# Patient Record
Sex: Female | Born: 1940 | Race: White | Hispanic: No | Smoking: Former smoker
Health system: Southern US, Community
[De-identification: ages and names within clinical notes are randomized; demographics above are authoritative.]

## PROBLEM LIST (undated history)

## (undated) DIAGNOSIS — R7303 Prediabetes: Secondary | ICD-10-CM

## (undated) DIAGNOSIS — K219 Gastro-esophageal reflux disease without esophagitis: Secondary | ICD-10-CM

## (undated) DIAGNOSIS — M1990 Unspecified osteoarthritis, unspecified site: Secondary | ICD-10-CM

## (undated) DIAGNOSIS — H9190 Unspecified hearing loss, unspecified ear: Secondary | ICD-10-CM

## (undated) DIAGNOSIS — E119 Type 2 diabetes mellitus without complications: Secondary | ICD-10-CM

## (undated) DIAGNOSIS — F419 Anxiety disorder, unspecified: Secondary | ICD-10-CM

## (undated) DIAGNOSIS — Z87898 Personal history of other specified conditions: Secondary | ICD-10-CM

## (undated) DIAGNOSIS — H3320 Serous retinal detachment, unspecified eye: Secondary | ICD-10-CM

## (undated) DIAGNOSIS — I499 Cardiac arrhythmia, unspecified: Secondary | ICD-10-CM

## (undated) DIAGNOSIS — E785 Hyperlipidemia, unspecified: Secondary | ICD-10-CM

## (undated) DIAGNOSIS — R112 Nausea with vomiting, unspecified: Secondary | ICD-10-CM

## (undated) DIAGNOSIS — I341 Nonrheumatic mitral (valve) prolapse: Secondary | ICD-10-CM

## (undated) DIAGNOSIS — Z9889 Other specified postprocedural states: Secondary | ICD-10-CM

## (undated) HISTORY — PX: COLONOSCOPY: SHX174

## (undated) HISTORY — DX: Serous retinal detachment, unspecified eye: H33.20

## (undated) HISTORY — PX: RETINAL DETACHMENT SURGERY: SHX105

## (undated) HISTORY — PX: TONSILLECTOMY: SUR1361

## (undated) HISTORY — PX: KNEE ARTHROSCOPY: SUR90

## (undated) HISTORY — PX: EYE SURGERY: SHX253

## (undated) HISTORY — DX: Type 2 diabetes mellitus without complications: E11.9

## (undated) HISTORY — PX: CATARACT EXTRACTION: SUR2

## (undated) HISTORY — PX: ABDOMINAL HYSTERECTOMY: SHX81

---
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
## 1998-10-20 ENCOUNTER — Ambulatory Visit (HOSPITAL_COMMUNITY): Admission: RE | Admit: 1998-10-20 | Discharge: 1998-10-20 | Payer: Self-pay | Admitting: Gastroenterology

## 1999-07-28 ENCOUNTER — Other Ambulatory Visit: Admission: RE | Admit: 1999-07-28 | Discharge: 1999-07-28 | Payer: Self-pay | Admitting: *Deleted

## 2000-06-28 ENCOUNTER — Ambulatory Visit (HOSPITAL_COMMUNITY): Admission: RE | Admit: 2000-06-28 | Discharge: 2000-06-28 | Payer: Self-pay | Admitting: *Deleted

## 2000-12-13 ENCOUNTER — Other Ambulatory Visit: Admission: RE | Admit: 2000-12-13 | Discharge: 2000-12-13 | Payer: Self-pay | Admitting: Family Medicine

## 2001-12-13 ENCOUNTER — Other Ambulatory Visit: Admission: RE | Admit: 2001-12-13 | Discharge: 2001-12-13 | Payer: Self-pay | Admitting: Family Medicine

## 2003-12-24 ENCOUNTER — Other Ambulatory Visit: Admission: RE | Admit: 2003-12-24 | Discharge: 2003-12-24 | Payer: Self-pay | Admitting: Family Medicine

## 2004-01-13 ENCOUNTER — Ambulatory Visit (HOSPITAL_COMMUNITY): Admission: RE | Admit: 2004-01-13 | Discharge: 2004-01-13 | Payer: Self-pay | Admitting: Gastroenterology

## 2004-03-31 ENCOUNTER — Encounter: Admission: RE | Admit: 2004-03-31 | Discharge: 2004-03-31 | Payer: Self-pay | Admitting: Family Medicine

## 2005-05-17 ENCOUNTER — Encounter: Admission: RE | Admit: 2005-05-17 | Discharge: 2005-05-17 | Payer: Self-pay | Admitting: Family Medicine

## 2006-01-04 ENCOUNTER — Encounter: Admission: RE | Admit: 2006-01-04 | Discharge: 2006-01-04 | Payer: Self-pay | Admitting: Orthopedic Surgery

## 2006-01-10 ENCOUNTER — Encounter: Admission: RE | Admit: 2006-01-10 | Discharge: 2006-01-10 | Payer: Self-pay | Admitting: Orthopedic Surgery

## 2006-08-11 ENCOUNTER — Other Ambulatory Visit: Admission: RE | Admit: 2006-08-11 | Discharge: 2006-08-11 | Payer: Self-pay | Admitting: Family Medicine

## 2007-04-07 ENCOUNTER — Encounter: Admission: RE | Admit: 2007-04-07 | Discharge: 2007-04-07 | Payer: Self-pay | Admitting: Orthopedic Surgery

## 2008-03-04 ENCOUNTER — Other Ambulatory Visit: Admission: RE | Admit: 2008-03-04 | Discharge: 2008-03-04 | Payer: Self-pay | Admitting: Family Medicine

## 2008-03-11 ENCOUNTER — Encounter: Admission: RE | Admit: 2008-03-11 | Discharge: 2008-03-11 | Payer: Self-pay | Admitting: Family Medicine

## 2008-07-08 ENCOUNTER — Emergency Department (HOSPITAL_COMMUNITY): Admission: EM | Admit: 2008-07-08 | Discharge: 2008-07-08 | Payer: Self-pay | Admitting: Emergency Medicine

## 2008-07-10 ENCOUNTER — Emergency Department (HOSPITAL_COMMUNITY): Admission: EM | Admit: 2008-07-10 | Discharge: 2008-07-10 | Payer: Self-pay | Admitting: Emergency Medicine

## 2010-02-03 ENCOUNTER — Other Ambulatory Visit: Admission: RE | Admit: 2010-02-03 | Discharge: 2010-02-03 | Payer: Self-pay | Admitting: Family Medicine

## 2011-04-02 NOTE — Op Note (Signed)
NAME:  Carla May, Carla May                        ACCOUNT NO.:  [id number]   MEDICAL RECORD NO.:  [id number]                   PATIENT TYPE:  AMB   LOCATION:  ENDO                                 FACILITY:  [facility]   PHYSICIAN:  John C. Madilyn Fireman, M.D.                 DATE OF BIRTH:  Oct 20, 1941   DATE OF PROCEDURE:  01/13/2004  DATE OF DISCHARGE:                                 OPERATIVE REPORT   PROCEDURE:  Colonoscopy.   INDICATIONS FOR PROCEDURE:  Family history of colon cancer in a first-degree  relative.  Last colonoscopy five years ago.   DESCRIPTION OF PROCEDURE:  The patient was placed in the left lateral  decubitus position and placed on the pulse monitor with continuous low-flow  oxygen delivered by nasal cannula.  She was sedated with 75 mcg IV fentanyl  and 7 mg IV Versed.  The Olympus video colonoscope was inserted into the  rectum and advanced to the cecum, confirmed by transillumination of  McBurney's point and visualization of the ileocecal valve and appendiceal  orifice.  Prep was excellent.  The cecum, ascending and transverse colon all  appeared normal with no masses, polyps, diverticula, or other mucosal  abnormalities.  The sigmoid and rectum likewise appeared normal and  retroflexed view of the anus revealed no obvious internal hemorrhoids.  The  scope was then withdrawn and the patient returned to the recovery room in  stable condition.  She tolerated the procedure well and there were no  immediate complications.   IMPRESSION:  Normal colonoscopy.   PLAN:  Repeat study in five years.                                               John C. Madilyn Fireman, M.D.    JCH/MEDQ  D:  01/13/2004  T:  01/13/2004  Job:  [id number]

## 2011-07-02 ENCOUNTER — Other Ambulatory Visit: Payer: Self-pay | Admitting: Family Medicine

## 2011-07-02 DIAGNOSIS — R42 Dizziness and giddiness: Secondary | ICD-10-CM

## 2011-07-08 ENCOUNTER — Ambulatory Visit
Admission: RE | Admit: 2011-07-08 | Discharge: 2011-07-08 | Disposition: A | Discharge status: Home / Self Care | Payer: Medicare Other | Source: Ambulatory Visit | Attending: Family Medicine | Admitting: Family Medicine

## 2011-07-08 DIAGNOSIS — R42 Dizziness and giddiness: Secondary | ICD-10-CM

## 2011-07-08 MED ORDER — GADOBENATE DIMEGLUMINE 529 MG/ML IV SOLN
15.0000 mL | Freq: Once | INTRAVENOUS | Status: AC | PRN
  Administered 2011-07-08: 15 mL via INTRAVENOUS

## 2013-01-22 ENCOUNTER — Emergency Department (HOSPITAL_COMMUNITY)
Admission: EM | Admit: 2013-01-22 | Discharge: 2013-01-22 | Disposition: A | Discharge status: Home / Self Care | Payer: No Typology Code available for payment source | Attending: Emergency Medicine | Admitting: Emergency Medicine

## 2013-01-22 ENCOUNTER — Emergency Department (HOSPITAL_COMMUNITY): Payer: No Typology Code available for payment source

## 2013-01-22 ENCOUNTER — Encounter (HOSPITAL_COMMUNITY): Payer: Self-pay | Admitting: Adult Health

## 2013-01-22 DIAGNOSIS — R0789 Other chest pain: Secondary | ICD-10-CM

## 2013-01-22 DIAGNOSIS — Y92410 Unspecified street and highway as the place of occurrence of the external cause: Secondary | ICD-10-CM | POA: Insufficient documentation

## 2013-01-22 DIAGNOSIS — S6000XA Contusion of unspecified finger without damage to nail, initial encounter: Secondary | ICD-10-CM | POA: Insufficient documentation

## 2013-01-22 DIAGNOSIS — S8000XA Contusion of unspecified knee, initial encounter: Secondary | ICD-10-CM | POA: Insufficient documentation

## 2013-01-22 DIAGNOSIS — S60012A Contusion of left thumb without damage to nail, initial encounter: Secondary | ICD-10-CM

## 2013-01-22 DIAGNOSIS — Y93I9 Activity, other involving external motion: Secondary | ICD-10-CM | POA: Insufficient documentation

## 2013-01-22 DIAGNOSIS — R0602 Shortness of breath: Secondary | ICD-10-CM | POA: Insufficient documentation

## 2013-01-22 DIAGNOSIS — S298XXA Other specified injuries of thorax, initial encounter: Secondary | ICD-10-CM | POA: Insufficient documentation

## 2013-01-22 DIAGNOSIS — S8002XA Contusion of left knee, initial encounter: Secondary | ICD-10-CM
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
## 2013-01-22 NOTE — ED Notes (Signed)
Advised of the wait time 

## 2013-01-22 NOTE — ED Provider Notes (Signed)
Medical screening examination/treatment/procedure(s) were performed by non-physician practitioner and as supervising physician I was immediately available for consultation/collaboration.  Raeford Razor, MD 01/22/13 2201
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
## 2014-04-05 ENCOUNTER — Other Ambulatory Visit: Payer: Self-pay | Admitting: Gastroenterology

## 2014-11-25 ENCOUNTER — Encounter (HOSPITAL_COMMUNITY): Payer: Self-pay | Admitting: Pharmacy Technician

## 2014-11-25 ENCOUNTER — Encounter (HOSPITAL_COMMUNITY): Payer: Self-pay | Admitting: *Deleted

## 2014-11-25 ENCOUNTER — Encounter (INDEPENDENT_AMBULATORY_CARE_PROVIDER_SITE_OTHER): Payer: Medicare Other | Admitting: Ophthalmology

## 2014-11-25 DIAGNOSIS — H338 Other retinal detachments: Secondary | ICD-10-CM

## 2014-11-25 DIAGNOSIS — H43813 Vitreous degeneration, bilateral: Secondary | ICD-10-CM | POA: Diagnosis not present

## 2014-11-25 MED ORDER — TROPICAMIDE 1 % OP SOLN: 1.0000 [drp] | OPHTHALMIC | Status: DC | PRN

## 2014-11-25 MED ORDER — GATIFLOXACIN 0.5 % OP SOLN
1.0000 [drp] | OPHTHALMIC | Status: DC | PRN
Start: 2014-11-25 — End: 2014-11-26

## 2014-11-25 MED ORDER — CYCLOPENTOLATE HCL 1 % OP SOLN: 1.0000 [drp] | OPHTHALMIC | Status: DC | PRN

## 2014-11-25 MED ORDER — PHENYLEPHRINE HCL 2.5 % OP SOLN: 1.0000 [drp] | OPHTHALMIC | Status: DC | PRN

## 2014-11-25 MED ORDER — CLINDAMYCIN PHOSPHATE 600 MG/50ML IV SOLN
600.0000 mg | INTRAVENOUS | Status: DC | PRN
  Administered 2014-11-26: 600 mg via INTRAVENOUS
  Filled 2014-11-25 (×3): qty 50

## 2014-11-25 NOTE — Anesthesia Preprocedure Evaluation (Signed)
Anesthesia Evaluation  Patient identified by MRN, date of birth, ID band Patient awake    Reviewed: Allergy & Precautions, NPO status , Patient's Chart, lab work & pertinent test results  History of Anesthesia Complications Negative for: history of anesthetic complications  Airway Mallampati: II  TM Distance: >3 FB Neck ROM: Full    Dental no notable dental hx. (+) Dental Advisory Given   Pulmonary former smoker,  breath sounds clear to auscultation  Pulmonary exam normal       Cardiovascular negative cardio ROS  + Valvular Problems/Murmurs MVP Rhythm:Regular Rate:Normal  Hx of heart palpitations, per patient, negative cardiac workup in the 90s   Neuro/Psych PSYCHIATRIC DISORDERS Anxiety negative neurological ROS     GI/Hepatic Neg liver ROS, GERD-  Medicated and Controlled,  Endo/Other  negative endocrine ROS  Renal/GU negative Renal ROS  negative genitourinary   Musculoskeletal  (+) Arthritis -, Osteoarthritis,    Abdominal   Peds negative pediatric ROS (+)  Hematology negative hematology ROS (+)   Anesthesia Other Findings   Reproductive/Obstetrics negative OB ROS                             Anesthesia Physical Anesthesia Plan  ASA: II  Anesthesia Plan: General   Post-op Pain Management:    Induction: Intravenous  Airway Management Planned: Oral ETT  Additional Equipment:   Intra-op Plan:   Post-operative Plan: Extubation in OR  Informed Consent: I have reviewed the patients History and Physical, chart, labs and discussed the procedure including the risks, benefits and alternatives for the proposed anesthesia with the patient or authorized representative who has indicated his/her understanding and acceptance.   Dental advisory given  Plan Discussed with: CRNA  Anesthesia Plan Comments:         Anesthesia Quick Evaluation

## 2014-11-25 NOTE — H&P (Signed)
Carla May is an 74 y.o. female.   Chief Complaint:loss of vision right eye  HPI: Noted loss of vision right over the past three months.  No past medical history on file.  No past surgical history on file.  No family history on file. Social History:  reports that she has never smoked. She does not have any smokeless tobacco history on file. She reports that she does not drink alcohol or use illicit drugs.  Allergies:  Allergies  Allergen Reactions  . Penicillins Itching and Nausea Only  . Sulfa Antibiotics Nausea Only    No prescriptions prior to admission    Review of systems otherwise negative  There were no vitals taken for this visit.  Physical exam: Mental status: oriented x3. Eyes: See eye exam associated with this date of surgery in media tab.  Scanned in by scanning center Ears, Nose, Throat: within normal limits Neck: Within Normal limits General: within normal limits Chest: Within normal limits Breast: deferred Heart: Within normal limits Abdomen: Within normal limits GU: deferred Extremities: within normal limits Skin: within normal limits  Assessment/Plan Rhegmatogenous retinal detachment right eye Plan: To [facility] for Scleral buckle, laser, gas injection and possible vitrectomy right eye.  Sherrie GeorgeMATTHEWS, Rima Blizzard D 11/25/2014, 11:30 AM

## 2014-11-25 NOTE — Progress Notes (Addendum)
Ms Carla May reports having heart palpations, occasionally, last time was approx 1 month orf so.  Patient denies SOB, last approximately 5- 10 mins and yaking deep breaths helps.  "Years ago , I had them more frequently and I had a cardiac work up (1990's) and it was fine, "probably stress'."   I faxed a request to Dr Maurice SmallElaine Griffin requesting EKGs and last office visit notes.  I informed Rica MastAngela Kabbe , NP of  History of palpations, no new orders.

## 2014-11-26 ENCOUNTER — Ambulatory Visit (HOSPITAL_COMMUNITY): Payer: Medicare Other | Admitting: Anesthesiology

## 2014-11-26 ENCOUNTER — Ambulatory Visit (HOSPITAL_COMMUNITY)
Admission: RE | Admit: 2014-11-26 | Discharge: 2014-11-27 | Disposition: A | Discharge status: Home / Self Care | Payer: Medicare Other | Source: Ambulatory Visit | Attending: Ophthalmology | Admitting: Ophthalmology

## 2014-11-26 ENCOUNTER — Encounter (HOSPITAL_COMMUNITY): Payer: Self-pay | Admitting: Anesthesiology

## 2014-11-26 ENCOUNTER — Encounter (HOSPITAL_COMMUNITY)
Admission: RE | Disposition: A | Discharge status: Home / Self Care | Payer: Self-pay | Source: Ambulatory Visit | Attending: Ophthalmology

## 2014-11-26 DIAGNOSIS — Z880 Allergy status to penicillin: Secondary | ICD-10-CM | POA: Diagnosis not present

## 2014-11-26 DIAGNOSIS — Z882 Allergy status to sulfonamides status: Secondary | ICD-10-CM | POA: Diagnosis not present

## 2014-11-26 DIAGNOSIS — H33001 Unspecified retinal detachment with retinal break, right eye: Secondary | ICD-10-CM | POA: Diagnosis not present

## 2014-11-26 DIAGNOSIS — H3500 Unspecified background retinopathy: Secondary | ICD-10-CM | POA: Diagnosis not present

## 2014-11-26 DIAGNOSIS — H33009 Unspecified retinal detachment with retinal break, unspecified eye: Secondary | ICD-10-CM

## 2014-11-26 DIAGNOSIS — E119 Type 2 diabetes mellitus without complications: Secondary | ICD-10-CM | POA: Insufficient documentation

## 2014-11-26 DIAGNOSIS — H5461 Unqualified visual loss, right eye, normal vision left eye: Secondary | ICD-10-CM | POA: Diagnosis present

## 2014-11-26 HISTORY — DX: Nonrheumatic mitral (valve) prolapse: I34.1

## 2014-11-26 HISTORY — DX: Hyperlipidemia, unspecified: E78.5

## 2014-11-26 HISTORY — DX: Gastro-esophageal reflux disease without esophagitis: K21.9

## 2014-11-26 HISTORY — PX: SCLERAL BUCKLE: SHX5340

## 2014-11-26 HISTORY — PX: PHOTOCOAGULATION WITH LASER: SHX6027

## 2014-11-26 HISTORY — DX: Personal history of other specified conditions: Z87.898

## 2014-11-26 HISTORY — PX: GAS INSERTION: SHX5336

## 2014-11-26 HISTORY — DX: Anxiety disorder, unspecified: F41.9

## 2014-11-26 HISTORY — DX: Unspecified osteoarthritis, unspecified site: M19.90

## 2014-11-26 LAB — CBC
HCT: 42.5 % (ref 36.0–46.0)
Hemoglobin: 14.3 g/dL (ref 12.0–15.0)
MCH: 30.1 pg (ref 26.0–34.0)
MCHC: 33.6 g/dL (ref 30.0–36.0)
MCV: 89.5 fL (ref 78.0–100.0)
Platelets: 218 10*3/uL (ref 150–400)
RBC: 4.75 MIL/uL (ref 3.87–5.11)
RDW: 13 % (ref 11.5–15.5)
WBC: 8.7 10*3/uL (ref 4.0–10.5)

## 2014-11-26 SURGERY — SCLERAL BUCKLE
Anesthesia: General | Site: Eye | Laterality: Right

## 2014-11-26 MED ORDER — ONDANSETRON HCL 4 MG/2ML IJ SOLN
INTRAMUSCULAR | Status: DC | PRN
  Administered 2014-11-26: 4 mg via INTRAVENOUS

## 2014-11-26 MED ORDER — PANTOPRAZOLE SODIUM 40 MG PO TBEC
40.0000 mg | DELAYED_RELEASE_TABLET | Freq: Every day | ORAL | Status: DC
  Administered 2014-11-26: 40 mg via ORAL
  Filled 2014-11-26: qty 1

## 2014-11-26 MED ORDER — NEOSTIGMINE METHYLSULFATE 10 MG/10ML IV SOLN
INTRAVENOUS | Status: DC | PRN
  Administered 2014-11-26: 4 mg via INTRAVENOUS

## 2014-11-26 MED ORDER — MIDAZOLAM HCL 2 MG/2ML IJ SOLN
INTRAMUSCULAR | Status: AC
  Filled 2014-11-26: qty 2

## 2014-11-26 MED ORDER — BSS PLUS IO SOLN
INTRAOCULAR | Status: AC
  Filled 2014-11-26: qty 500

## 2014-11-26 MED ORDER — PROPOFOL 10 MG/ML IV BOLUS
INTRAVENOUS | Status: DC | PRN
  Administered 2014-11-26: 110 mg via INTRAVENOUS

## 2014-11-26 MED ORDER — HYPROMELLOSE (GONIOSCOPIC) 2.5 % OP SOLN
OPHTHALMIC | Status: AC
  Filled 2014-11-26: qty 15

## 2014-11-26 MED ORDER — ROCURONIUM BROMIDE 50 MG/5ML IV SOLN
INTRAVENOUS | Status: AC
  Filled 2014-11-26: qty 1

## 2014-11-26 MED ORDER — ONDANSETRON HCL 4 MG/2ML IJ SOLN
4.0000 mg | Freq: Four times a day (QID) | INTRAMUSCULAR | Status: DC | PRN
  Administered 2014-11-26 – 2014-11-27 (×2): 4 mg via INTRAVENOUS
  Filled 2014-11-26 (×2): qty 2

## 2014-11-26 MED ORDER — MIDAZOLAM HCL 5 MG/5ML IJ SOLN
INTRAMUSCULAR | Status: DC | PRN
  Administered 2014-11-26: 1 mg via INTRAVENOUS

## 2014-11-26 MED ORDER — BRIMONIDINE TARTRATE 0.2 % OP SOLN
1.0000 [drp] | Freq: Two times a day (BID) | OPHTHALMIC | Status: DC
  Filled 2014-11-26: qty 5

## 2014-11-26 MED ORDER — EZETIMIBE 10 MG PO TABS
10.0000 mg | ORAL_TABLET | Freq: Every day | ORAL | Status: DC
  Administered 2014-11-26: 10 mg via ORAL
  Filled 2014-11-26 (×2): qty 1

## 2014-11-26 MED ORDER — GATIFLOXACIN 0.5 % OP SOLN
1.0000 [drp] | Freq: Four times a day (QID) | OPHTHALMIC | Status: DC
  Filled 2014-11-26: qty 2.5

## 2014-11-26 MED ORDER — BUPIVACAINE HCL (PF) 0.75 % IJ SOLN
INTRAMUSCULAR | Status: AC
  Filled 2014-11-26: qty 10

## 2014-11-26 MED ORDER — GENTAMICIN SULFATE 40 MG/ML IJ SOLN
INTRAMUSCULAR | Status: AC
  Filled 2014-11-26: qty 2

## 2014-11-26 MED ORDER — TROPICAMIDE 1 % OP SOLN
1.0000 [drp] | OPHTHALMIC | Status: DC | PRN
  Administered 2014-11-26 (×3): 1 [drp] via OPHTHALMIC

## 2014-11-26 MED ORDER — CYCLOPENTOLATE HCL 1 % OP SOLN
1.0000 [drp] | OPHTHALMIC | Status: DC | PRN
  Administered 2014-11-26: 10:00:00 via OPHTHALMIC
  Administered 2014-11-26 (×2): 1 [drp] via OPHTHALMIC

## 2014-11-26 MED ORDER — MORPHINE SULFATE 2 MG/ML IJ SOLN
1.0000 mg | INTRAMUSCULAR | Status: DC | PRN
  Administered 2014-11-26: 2 mg via INTRAVENOUS
  Filled 2014-11-26: qty 1

## 2014-11-26 MED ORDER — BUPIVACAINE HCL (PF) 0.75 % IJ SOLN
INTRAMUSCULAR | Status: DC | PRN
  Administered 2014-11-26: 10 mL

## 2014-11-26 MED ORDER — BSS IO SOLN
INTRAOCULAR | Status: DC | PRN
  Administered 2014-11-26: 15 mL via INTRAOCULAR

## 2014-11-26 MED ORDER — DEXAMETHASONE SODIUM PHOSPHATE 10 MG/ML IJ SOLN
INTRAMUSCULAR | Status: DC | PRN
  Administered 2014-11-26: 10 mg

## 2014-11-26 MED ORDER — BACITRACIN-POLYMYXIN B 500-10000 UNIT/GM OP OINT
TOPICAL_OINTMENT | OPHTHALMIC | Status: DC | PRN
  Administered 2014-11-26: 1 via OPHTHALMIC

## 2014-11-26 MED ORDER — ACETAMINOPHEN 160 MG/5ML PO SOLN
325.0000 mg | ORAL | Status: DC | PRN
  Filled 2014-11-26: qty 20.3

## 2014-11-26 MED ORDER — HYDROCODONE-ACETAMINOPHEN 5-325 MG PO TABS: 1.0000 | ORAL_TABLET | ORAL | Status: DC | PRN

## 2014-11-26 MED ORDER — POLYMYXIN B SULFATE 500000 UNITS IJ SOLR
INTRAMUSCULAR | Status: AC
  Filled 2014-11-26: qty 1

## 2014-11-26 MED ORDER — DEXAMETHASONE SODIUM PHOSPHATE 10 MG/ML IJ SOLN
INTRAMUSCULAR | Status: AC
  Filled 2014-11-26: qty 1

## 2014-11-26 MED ORDER — TRIAMCINOLONE ACETONIDE 40 MG/ML IJ SUSP
INTRAMUSCULAR | Status: AC
  Filled 2014-11-26: qty 5

## 2014-11-26 MED ORDER — GLYCOPYRROLATE 0.2 MG/ML IJ SOLN
INTRAMUSCULAR | Status: AC
  Filled 2014-11-26: qty 4

## 2014-11-26 MED ORDER — ONDANSETRON HCL 4 MG/2ML IJ SOLN
INTRAMUSCULAR | Status: AC
  Filled 2014-11-26: qty 2

## 2014-11-26 MED ORDER — DEXTROSE 5 % IV SOLN
INTRAVENOUS | Status: DC | PRN
  Administered 2014-11-26: 15:00:00 via INTRAVENOUS

## 2014-11-26 MED ORDER — SODIUM HYALURONATE 10 MG/ML IO SOLN
INTRAOCULAR | Status: AC
  Filled 2014-11-26: qty 0.85

## 2014-11-26 MED ORDER — GATIFLOXACIN 0.5 % OP SOLN
1.0000 [drp] | OPHTHALMIC | Status: DC | PRN
  Administered 2014-11-26 (×3): 1 [drp] via OPHTHALMIC

## 2014-11-26 MED ORDER — ESMOLOL HCL 10 MG/ML IV SOLN
INTRAVENOUS | Status: DC | PRN
  Administered 2014-11-26: 30 mg via INTRAVENOUS

## 2014-11-26 MED ORDER — GATIFLOXACIN 0.5 % OP SOLN
OPHTHALMIC | Status: AC
  Administered 2014-11-26: 1 [drp] via OPHTHALMIC
  Filled 2014-11-26: qty 2.5

## 2014-11-26 MED ORDER — GLYCOPYRROLATE 0.2 MG/ML IJ SOLN
INTRAMUSCULAR | Status: DC | PRN
  Administered 2014-11-26: 0.6 mg via INTRAVENOUS
  Administered 2014-11-26: .12 mg via INTRAVENOUS
  Administered 2014-11-26: .08 mg via INTRAVENOUS

## 2014-11-26 MED ORDER — 0.9 % SODIUM CHLORIDE (POUR BTL) OPTIME
TOPICAL | Status: DC | PRN
  Administered 2014-11-26: 200 mL

## 2014-11-26 MED ORDER — LIDOCAINE HCL (CARDIAC) 20 MG/ML IV SOLN
INTRAVENOUS | Status: DC | PRN
  Administered 2014-11-26: 60 mg via INTRAVENOUS

## 2014-11-26 MED ORDER — DEXAMETHASONE SODIUM PHOSPHATE 4 MG/ML IJ SOLN
INTRAMUSCULAR | Status: DC | PRN
  Administered 2014-11-26: 4 mg via INTRAVENOUS

## 2014-11-26 MED ORDER — BACITRACIN-POLYMYXIN B 500-10000 UNIT/GM OP OINT
1.0000 "application " | TOPICAL_OINTMENT | Freq: Three times a day (TID) | OPHTHALMIC | Status: DC
  Filled 2014-11-26: qty 3.5

## 2014-11-26 MED ORDER — LACTATED RINGERS IV SOLN
INTRAVENOUS | Status: DC | PRN
  Administered 2014-11-26 (×2): via INTRAVENOUS

## 2014-11-26 MED ORDER — FENTANYL CITRATE 0.05 MG/ML IJ SOLN
INTRAMUSCULAR | Status: DC | PRN
  Administered 2014-11-26 (×4): 50 ug via INTRAVENOUS

## 2014-11-26 MED ORDER — ONDANSETRON HCL 4 MG/2ML IJ SOLN
4.0000 mg | Freq: Once | INTRAMUSCULAR | Status: AC | PRN
  Administered 2014-11-26: 4 mg via INTRAVENOUS

## 2014-11-26 MED ORDER — SODIUM CHLORIDE 0.45 % IV SOLN
INTRAVENOUS | Status: DC
  Administered 2014-11-26: 20:00:00 via INTRAVENOUS

## 2014-11-26 MED ORDER — SODIUM CHLORIDE 0.9 % IJ SOLN
INTRAMUSCULAR | Status: DC | PRN
  Administered 2014-11-26: 10 mL

## 2014-11-26 MED ORDER — TEMAZEPAM 15 MG PO CAPS: 15.0000 mg | ORAL_CAPSULE | Freq: Every evening | ORAL | Status: DC | PRN

## 2014-11-26 MED ORDER — BSS IO SOLN
INTRAOCULAR | Status: AC
  Filled 2014-11-26: qty 15

## 2014-11-26 MED ORDER — LACTATED RINGERS IV SOLN
INTRAVENOUS | Status: DC
  Administered 2014-11-26: 10:00:00 via INTRAVENOUS

## 2014-11-26 MED ORDER — DEXAMETHASONE SODIUM PHOSPHATE 4 MG/ML IJ SOLN
INTRAMUSCULAR | Status: AC
  Filled 2014-11-26: qty 1

## 2014-11-26 MED ORDER — LIDOCAINE HCL (CARDIAC) 20 MG/ML IV SOLN
INTRAVENOUS | Status: AC
  Filled 2014-11-26: qty 5

## 2014-11-26 MED ORDER — OMEPRAZOLE MAGNESIUM 20 MG PO TBEC: 20.0000 mg | DELAYED_RELEASE_TABLET | Freq: Every day | ORAL | Status: DC

## 2014-11-26 MED ORDER — TETRACAINE HCL 0.5 % OP SOLN
2.0000 [drp] | Freq: Once | OPHTHALMIC | Status: DC
  Filled 2014-11-26: qty 2

## 2014-11-26 MED ORDER — STERILE WATER FOR IRRIGATION IR SOLN
Status: DC | PRN
  Administered 2014-11-26: 200 mL

## 2014-11-26 MED ORDER — LATANOPROST 0.005 % OP SOLN
1.0000 [drp] | Freq: Every day | OPHTHALMIC | Status: DC
  Filled 2014-11-26: qty 2.5

## 2014-11-26 MED ORDER — ACETAMINOPHEN 325 MG PO TABS: 325.0000 mg | ORAL_TABLET | ORAL | Status: DC | PRN

## 2014-11-26 MED ORDER — FENTANYL CITRATE 0.05 MG/ML IJ SOLN
INTRAMUSCULAR | Status: AC
  Filled 2014-11-26: qty 2

## 2014-11-26 MED ORDER — FENTANYL CITRATE 0.05 MG/ML IJ SOLN
25.0000 ug | INTRAMUSCULAR | Status: DC | PRN
  Administered 2014-11-26 (×2): 50 ug via INTRAVENOUS

## 2014-11-26 MED ORDER — CYCLOPENTOLATE HCL 1 % OP SOLN
OPHTHALMIC | Status: AC
  Filled 2014-11-26: qty 2

## 2014-11-26 MED ORDER — DOCUSATE SODIUM 100 MG PO CAPS
100.0000 mg | ORAL_CAPSULE | Freq: Two times a day (BID) | ORAL | Status: DC
  Administered 2014-11-26: 100 mg via ORAL
  Filled 2014-11-26: qty 1

## 2014-11-26 MED ORDER — ACETAZOLAMIDE SODIUM 500 MG IJ SOLR
500.0000 mg | Freq: Once | INTRAMUSCULAR | Status: AC
  Administered 2014-11-27: 500 mg via INTRAVENOUS
  Filled 2014-11-26: qty 500

## 2014-11-26 MED ORDER — ACETAMINOPHEN ER 650 MG PO TBCR: 1300.0000 mg | EXTENDED_RELEASE_TABLET | Freq: Three times a day (TID) | ORAL | Status: DC | PRN

## 2014-11-26 MED ORDER — HYDROMORPHONE HCL 1 MG/ML IJ SOLN
INTRAMUSCULAR | Status: AC
  Filled 2014-11-26: qty 1

## 2014-11-26 MED ORDER — FENTANYL CITRATE 0.05 MG/ML IJ SOLN
INTRAMUSCULAR | Status: AC
  Filled 2014-11-26: qty 5

## 2014-11-26 MED ORDER — ROCURONIUM BROMIDE 100 MG/10ML IV SOLN
INTRAVENOUS | Status: DC | PRN
  Administered 2014-11-26: 10 mg via INTRAVENOUS
  Administered 2014-11-26: 40 mg via INTRAVENOUS

## 2014-11-26 MED ORDER — BACITRACIN-POLYMYXIN B 500-10000 UNIT/GM OP OINT
TOPICAL_OINTMENT | OPHTHALMIC | Status: AC
  Filled 2014-11-26: qty 3.5

## 2014-11-26 MED ORDER — TROPICAMIDE 1 % OP SOLN
OPHTHALMIC | Status: AC
Start: 2014-11-26 — End: 2014-11-26
  Administered 2014-11-26: 1 [drp] via OPHTHALMIC
  Filled 2014-11-26: qty 3

## 2014-11-26 MED ORDER — PROPOFOL 10 MG/ML IV BOLUS
INTRAVENOUS | Status: AC
  Filled 2014-11-26: qty 20

## 2014-11-26 MED ORDER — PREDNISOLONE ACETATE 1 % OP SUSP
1.0000 [drp] | Freq: Four times a day (QID) | OPHTHALMIC | Status: DC
  Filled 2014-11-26: qty 1

## 2014-11-26 MED ORDER — NEOSTIGMINE METHYLSULFATE 10 MG/10ML IV SOLN
INTRAVENOUS | Status: AC
  Filled 2014-11-26: qty 1

## 2014-11-26 MED ORDER — PHENYLEPHRINE HCL 2.5 % OP SOLN
OPHTHALMIC | Status: AC
  Administered 2014-11-26: 1 [drp] via OPHTHALMIC
  Filled 2014-11-26: qty 2

## 2014-11-26 MED ORDER — EPINEPHRINE HCL 1 MG/ML IJ SOLN
INTRAMUSCULAR | Status: AC
  Filled 2014-11-26: qty 1

## 2014-11-26 MED ORDER — ATROPINE SULFATE 1 % OP SOLN
OPHTHALMIC | Status: AC
  Filled 2014-11-26: qty 5

## 2014-11-26 MED ORDER — PHENYLEPHRINE HCL 2.5 % OP SOLN
1.0000 [drp] | OPHTHALMIC | Status: AC | PRN
  Administered 2014-11-26 (×3): 1 [drp] via OPHTHALMIC

## 2014-11-26 MED ORDER — ATROPINE SULFATE 1 % OP SOLN
OPHTHALMIC | Status: DC | PRN
  Administered 2014-11-26: 2 [drp] via OPHTHALMIC

## 2014-11-26 MED ORDER — MAGNESIUM HYDROXIDE 400 MG/5ML PO SUSP: 15.0000 mL | Freq: Four times a day (QID) | ORAL | Status: DC | PRN

## 2014-11-26 SURGICAL SUPPLY — 73 items
APPLICATOR DR MATTHEWS STRL (MISCELLANEOUS) ×12 IMPLANT
BLADE 10 SAFETY STRL DISP (BLADE) IMPLANT
BLADE EYE CATARACT 19 1.4 BEAV (BLADE) ×2 IMPLANT
BLADE MVR KNIFE 19G (BLADE) IMPLANT
CANNULA ANT CHAM MAIN (OPHTHALMIC RELATED) IMPLANT
CANNULA DUAL BORE 23G (CANNULA) IMPLANT
CORDS BIPOLAR (ELECTRODE) IMPLANT
COTTONBALL LRG STERILE PKG (GAUZE/BANDAGES/DRESSINGS) ×6 IMPLANT
COVER MAYO STAND STRL (DRAPES) IMPLANT
COVER SURGICAL LIGHT HANDLE (MISCELLANEOUS) ×2 IMPLANT
DRAPE INCISE 51X51 W/FILM STRL (DRAPES) ×2 IMPLANT
DRAPE OPHTHALMIC 77X100 STRL (CUSTOM PROCEDURE TRAY) ×2 IMPLANT
ERASER HMR WETFIELD 23G BP (MISCELLANEOUS) IMPLANT
FILTER BLUE MILLIPORE (MISCELLANEOUS) ×4 IMPLANT
FILTER STRAW FLUID ASPIR (MISCELLANEOUS) IMPLANT
FORCEPS GRIESHABER ILM 25G A (INSTRUMENTS) IMPLANT
GAS AUTO FILL CONSTEL (OPHTHALMIC)
GAS AUTO FILL CONSTELLATION (OPHTHALMIC) IMPLANT
GLOVE SS BIOGEL STRL SZ 6.5 (GLOVE) ×1 IMPLANT
GLOVE SS BIOGEL STRL SZ 7 (GLOVE) ×1 IMPLANT
GLOVE SUPERSENSE BIOGEL SZ 6.5 (GLOVE) ×1
GLOVE SUPERSENSE BIOGEL SZ 7 (GLOVE) ×1
GLOVE SURG 8.5 LATEX PF (GLOVE) ×2 IMPLANT
GLOVE SURG SS PI 6.5 STRL IVOR (GLOVE) ×2 IMPLANT
GLOVE SURG SS PI 7.0 STRL IVOR (GLOVE) ×2 IMPLANT
GOWN STRL REUS W/ TWL LRG LVL3 (GOWN DISPOSABLE) ×3 IMPLANT
GOWN STRL REUS W/TWL LRG LVL3 (GOWN DISPOSABLE) ×3
HANDLE PNEUMATIC FOR CONSTEL (OPHTHALMIC) IMPLANT
IMPL SILICONE (Ophthalmic Related) ×1 IMPLANT
IMPLANT SILICONE (Ophthalmic Related) ×3 IMPLANT
KIT BASIN OR (CUSTOM PROCEDURE TRAY) ×2 IMPLANT
KIT PERFLUORON PROCEDURE 5ML (MISCELLANEOUS) IMPLANT
KIT ROOM TURNOVER OR (KITS) ×2 IMPLANT
KNIFE CRESCENT 1.75 EDGEAHEAD (BLADE) IMPLANT
KNIFE GRIESHABER SHARP 2.5MM (MISCELLANEOUS) ×8 IMPLANT
MICROPICK 25G (MISCELLANEOUS)
NEEDLE 18GX1X1/2 (RX/OR ONLY) (NEEDLE) ×2 IMPLANT
NEEDLE 25GX 5/8IN NON SAFETY (NEEDLE) IMPLANT
NEEDLE 27GAX1X1/2 (NEEDLE) IMPLANT
NEEDLE HYPO 30X.5 LL (NEEDLE) ×6 IMPLANT
NS IRRIG 1000ML POUR BTL (IV SOLUTION) ×2 IMPLANT
PACK VITRECTOMY CUSTOM (CUSTOM PROCEDURE TRAY) ×2 IMPLANT
PAD ARMBOARD 7.5X6 YLW CONV (MISCELLANEOUS) ×2 IMPLANT
PAK PIK VITRECTOMY CVS 25GA (OPHTHALMIC) IMPLANT
PIC ILLUMINATED 25G (OPHTHALMIC)
PICK MICROPICK 25G (MISCELLANEOUS) IMPLANT
PIK ILLUMINATED 25G (OPHTHALMIC) IMPLANT
PROBE DIRECTIONAL LASER (MISCELLANEOUS) IMPLANT
PROBE LASER ILLUM FLEX CVD 25G (OPHTHALMIC) IMPLANT
REPL STRA BRUSH NEEDLE (NEEDLE) IMPLANT
RESERVOIR BACK FLUSH (MISCELLANEOUS) IMPLANT
ROLLS DENTAL (MISCELLANEOUS) ×4 IMPLANT
SLEEVE SCLERAL TYPE 270 (Ophthalmic Related) ×2 IMPLANT
SPEAR EYE SURG WECK-CEL (MISCELLANEOUS) ×8 IMPLANT
SPONGE SURGIFOAM ABS GEL 12-7 (HEMOSTASIS) ×2 IMPLANT
STOPCOCK 4 WAY LG BORE MALE ST (IV SETS) IMPLANT
SUT CHROMIC 7 0 TG140 8 (SUTURE) ×2 IMPLANT
SUT ETHILON 9 0 TG140 8 (SUTURE) IMPLANT
SUT MERSILENE 4 0 RV 2 (SUTURE) ×4 IMPLANT
SUT SILK 2 0 (SUTURE) ×1
SUT SILK 2-0 18XBRD TIE 12 (SUTURE) ×1 IMPLANT
SUT SILK 4 0 RB 1 (SUTURE) ×2 IMPLANT
SYR 20CC LL (SYRINGE) ×2 IMPLANT
SYR 50ML LL SCALE MARK (SYRINGE) IMPLANT
SYR 5ML LL (SYRINGE) ×2 IMPLANT
SYR BULB 3OZ (MISCELLANEOUS) ×2 IMPLANT
SYR TB 1ML LUER SLIP (SYRINGE) IMPLANT
SYRINGE 10CC LL (SYRINGE) ×2 IMPLANT
TIRE RTNL 2.5XGRV CNCV 9X (Ophthalmic Related) ×1 IMPLANT
TOWEL OR 17X24 6PK STRL BLUE (TOWEL DISPOSABLE) ×6 IMPLANT
TUBING ART PRESS 12 MALE/MALE (MISCELLANEOUS) IMPLANT
WATER STERILE IRR 1000ML POUR (IV SOLUTION) ×2 IMPLANT
WIPE INSTRUMENT VISIWIPE 73X73 (MISCELLANEOUS) ×2 IMPLANT

## 2014-11-26 NOTE — OR Nursing (Signed)
Green Gas Injection bracelet placed to patient's left arm in the OR.

## 2014-11-26 NOTE — Transfer of Care (Signed)
Immediate Anesthesia Transfer of Care Note  Patient: Carla May  Procedure(s) Performed: Procedure(s) with comments: SCLERAL BUCKLE RIGHT EYE  (Right) PHOTOCOAGULATION WITH LASER (Right) INSERTION OF GAS (Right) - C3F8  Patient Location: PACU  Anesthesia Type:General  Level of Consciousness: awake, alert , oriented and patient cooperative  Airway & Oxygen Therapy: Patient Spontanous Breathing and Patient connected to nasal cannula oxygen  Post-op Assessment: Report given to PACU RN, Post -op Vital signs reviewed and stable and Patient moving all extremities  Post vital signs: Reviewed and stable  Complications: No apparent anesthesia complications

## 2014-11-26 NOTE — Progress Notes (Signed)
Dr Ashley RoyaltyMatthews called from the OR and requested eye gtts be repeated one more time.  This was done.

## 2014-11-26 NOTE — Plan of Care (Signed)
Problem: Phase I Progression Outcomes Goal: Patient positioned per MD order Outcome: Completed/Met Date Met:  11/26/14 Lt side down

## 2014-11-26 NOTE — H&P (Signed)
I examined the patient today and there is no change in the medical status 

## 2014-11-26 NOTE — Brief Op Note (Signed)
Brief Operative note   Preoperative diagnosis:  RETINAL DETACHMENT RIGHT EYE  Postoperative diagnosis  * No Diagnosis Codes entered *  Procedures: Scleral buckle, laser, gas, cryopexy right eye  Surgeon:  Sherrie GeorgeJohn D Matthews, MD...  Assistant:  Rosalie DoctorLisa Johnson SA    Anesthesia: General  Specimen: none  Estimated blood loss:  1cc  Complications: none  Patient sent to PACU in good condition  Composed by Sherrie GeorgeJohn D Matthews MD  Dictation number: [id number]

## 2014-11-27 ENCOUNTER — Encounter (HOSPITAL_COMMUNITY): Payer: Self-pay | Admitting: Ophthalmology

## 2014-11-27 DIAGNOSIS — H33001 Unspecified retinal detachment with retinal break, right eye: Secondary | ICD-10-CM | POA: Diagnosis not present

## 2014-11-27 MED ORDER — GATIFLOXACIN 0.5 % OP SOLN: 1.0000 [drp] | Freq: Four times a day (QID) | OPHTHALMIC | Status: DC

## 2014-11-27 MED ORDER — PROMETHAZINE HCL 25 MG/ML IJ SOLN: 6.2500 mg | Freq: Four times a day (QID) | INTRAMUSCULAR | Status: DC | PRN

## 2014-11-27 MED ORDER — LATANOPROST 0.005 % OP SOLN: 1.0000 [drp] | Freq: Every day | OPHTHALMIC | Status: DC

## 2014-11-27 MED ORDER — PREDNISOLONE ACETATE 1 % OP SUSP: 1.0000 [drp] | Freq: Four times a day (QID) | OPHTHALMIC | Status: DC

## 2014-11-27 MED ORDER — MENTHOL 3 MG MT LOZG: 1.0000 | LOZENGE | OROMUCOSAL | Status: DC | PRN

## 2014-11-27 MED ORDER — BACITRACIN-POLYMYXIN B 500-10000 UNIT/GM OP OINT: 1.0000 "application " | TOPICAL_OINTMENT | Freq: Three times a day (TID) | OPHTHALMIC | Status: DC

## 2014-11-27 MED ORDER — BRIMONIDINE TARTRATE 0.2 % OP SOLN: 1.0000 [drp] | Freq: Two times a day (BID) | OPHTHALMIC | Status: DC

## 2014-11-27 MED ORDER — PROMETHAZINE HCL 12.5 MG RE SUPP
6.2500 mg | Freq: Four times a day (QID) | RECTAL | Status: DC | PRN
  Filled 2014-11-27: qty 1

## 2014-11-27 MED ORDER — PROMETHAZINE HCL 25 MG/ML IJ SOLN
6.2500 mg | Freq: Four times a day (QID) | INTRAMUSCULAR | Status: DC | PRN
  Filled 2014-11-27: qty 1

## 2014-11-27 MED ORDER — MENTHOL 3 MG MT LOZG
1.0000 | LOZENGE | OROMUCOSAL | Status: DC | PRN
  Filled 2014-11-27: qty 9

## 2014-11-27 MED ORDER — PROMETHAZINE HCL 25 MG/ML IJ SOLN
6.2500 mg | Freq: Four times a day (QID) | INTRAMUSCULAR | Status: DC | PRN
  Administered 2014-11-27: 6.25 mg via INTRAMUSCULAR

## 2014-11-27 NOTE — Progress Notes (Signed)
11/27/2014, 6:46 AM  Mental Status:  Awake, Alert, Oriented  Anterior segment: Cornea  Clear    Anterior Chamber Clear    Lens:    IOL  Intra Ocular Pressure 23 mmHg with Tonopen  Vitreous: Clear 15%gas bubble   Retina:  Attached Good laser reaction   Impression: Excellent result Retina attached   Final Diagnosis: Principal Problem:   Rhegmatogenous retinal detachment of right eye   Plan: start post operative eye drops.  Add alphagan and xalatan OD.  Discharge to home.  Give post operative instructions  Sherrie GeorgeMATTHEWS, JOHN D 11/27/2014, 6:46 AM

## 2014-11-27 NOTE — Op Note (Signed)
NAMSheffield Slider:  May, Carla              ACCOUNT NO.:  [id number]  MEDICAL RECORD NO.:  [id number]  LOCATION:  [id number]                        FACILITY:  [facility]  PHYSICIAN:  Carla May, M.D. DATE OF BIRTH:  28-Apr-1941  DATE OF PROCEDURE:  11/26/2014 DATE OF DISCHARGE:                              OPERATIVE REPORT   ADMISSION DIAGNOSIS:  Rhegmatogenous retinal detachment, right eye.  PROCEDURE:  Scleral buckle, right eye; retinal photocoagulation, right eye; gas fluid exchange, right eye; C3F8 injection, right eye.  SURGEON:  Carla May, M.D.  ASSISTANT:  Rosalie DoctorLisa Johnson, SA.  ANESTHESIA:  General.  DETAILS:  Usual prep and drape, 360-degree limbal peritomy isolation of 4 rectus muscles on 2-0 silk, scleral dissection for 360 degrees to admit a #279 intrascleral implant.  2 mm were trimmed from the posterior edge.  Diathermy was placed in the bed.  Inspection of the far periphery was performed with a CryoProbe.  Three small cryo marks were placed on retinal breaks in the far periphery.  Scleral buckle was placed around the eye with the joint at 2 o'clock.  240 band was placed around the eye with a 270 sleeve at 2 o'clock.  Perforation site was chosen at 9:30 o'clock.  Large amount of clear colorless subretinal fluid came forth in a controlled manner.  Subretinal pigment also came forth from the perforation site.  Once the fluid was completely drained, the scleral sutures were drawn securely to reform the globe.  The buckle was adjusted and trimmed.  The band was adjusted and trimmed.  Indirect ophthalmoscopy showed the retina to be lying nicely in place on the scleral buckle.  A small wrinkle was seen at 12 o'clock.  C3F8 0.6 mL 100% was then injected into the vitreous cavity through the pars plana at 12 o'clock.  The indirect ophthalmoscope laser was moved into place. 882 burns were placed around the retinal periphery.  The power was between 400 and 600 mW, 1000 microns each  and 0.1 seconds each.  The scleral sutures were knotted and the free ends removed.  Paracentesis x1 obtained a closing pressure of 15 with a Barraquer tonometer.  The conjunctiva was reposited with 7-0 chromic suture.  Polymyxin and gentamicin were irrigated into tenon space.  Atropine solution was applied.  Marcaine was injected around the globe for postop pain. Polysporin ophthalmic ointment, a patch, and shield were placed. Closing pressure was 15 with a Barraquer keratometer.  COMPLICATIONS:  None.  DURATION:  2 hours.     Carla May, M.D.     JDM/MEDQ  D:  11/26/2014  T:  11/27/2014  Job:  [id number]

## 2014-11-27 NOTE — Progress Notes (Signed)
Discharge home. Home discharge instruction given phenergan 6.25 mg IM given prior to discharge. Md made aware of the nausea and vomiting prior to discharge, Md to fax order to pharmacy for N/V. Discharge instruction given by MD.

## 2014-11-27 NOTE — Discharge Summary (Signed)
Discharge summary not needed on [facility] patients per medical records. 

## 2014-11-27 NOTE — Anesthesia Postprocedure Evaluation (Signed)
  Anesthesia Post-op Note  Patient: Carla May  Procedure(s) Performed: Procedure(s) with comments: SCLERAL BUCKLE RIGHT EYE  (Right) PHOTOCOAGULATION WITH LASER (Right) INSERTION OF GAS (Right) - C3F8  Patient Location: PACU  Anesthesia Type:General  Level of Consciousness: awake  Airway and Oxygen Therapy: Patient Spontanous Breathing  Post-op Pain: mild  Post-op Assessment: Post-op Vital signs reviewed, Patient's Cardiovascular Status Stable, Respiratory Function Stable, Patent Airway, No signs of Nausea or vomiting and Pain level controlled  Post-op Vital Signs: Reviewed and stable  Last Vitals:  Filed Vitals:   11/27/14 0532  BP: 97/51  Pulse: 85  Temp: 36.9 C  Resp: 16    Complications: No apparent anesthesia complications

## 2014-12-03 ENCOUNTER — Encounter (INDEPENDENT_AMBULATORY_CARE_PROVIDER_SITE_OTHER): Payer: Medicare Other | Admitting: Ophthalmology

## 2014-12-03 DIAGNOSIS — H338 Other retinal detachments: Secondary | ICD-10-CM

## 2014-12-26 ENCOUNTER — Encounter (INDEPENDENT_AMBULATORY_CARE_PROVIDER_SITE_OTHER): Payer: Medicare Other | Admitting: Ophthalmology

## 2014-12-26 DIAGNOSIS — H338 Other retinal detachments: Secondary | ICD-10-CM

## 2015-03-06 ENCOUNTER — Encounter (INDEPENDENT_AMBULATORY_CARE_PROVIDER_SITE_OTHER): Payer: Medicare Other | Admitting: Ophthalmology

## 2015-03-06 DIAGNOSIS — H43813 Vitreous degeneration, bilateral: Secondary | ICD-10-CM | POA: Diagnosis not present

## 2015-03-06 DIAGNOSIS — H338 Other retinal detachments: Secondary | ICD-10-CM | POA: Diagnosis not present

## 2016-03-05 ENCOUNTER — Ambulatory Visit (INDEPENDENT_AMBULATORY_CARE_PROVIDER_SITE_OTHER): Payer: Medicare Other | Admitting: Ophthalmology

## 2016-03-05 DIAGNOSIS — H338 Other retinal detachments: Secondary | ICD-10-CM

## 2016-03-05 DIAGNOSIS — H43813 Vitreous degeneration, bilateral: Secondary | ICD-10-CM

## 2017-03-07 ENCOUNTER — Ambulatory Visit (INDEPENDENT_AMBULATORY_CARE_PROVIDER_SITE_OTHER): Payer: Medicare Other | Admitting: Ophthalmology

## 2017-03-07 DIAGNOSIS — H338 Other retinal detachments: Secondary | ICD-10-CM | POA: Diagnosis not present

## 2017-03-07 DIAGNOSIS — H43813 Vitreous degeneration, bilateral: Secondary | ICD-10-CM

## 2017-11-02 ENCOUNTER — Other Ambulatory Visit: Payer: Self-pay | Admitting: *Deleted

## 2017-11-02 ENCOUNTER — Encounter: Payer: Self-pay | Admitting: Neurology

## 2017-11-02 DIAGNOSIS — M5412 Radiculopathy, cervical region: Secondary | ICD-10-CM

## 2017-11-22 ENCOUNTER — Ambulatory Visit (INDEPENDENT_AMBULATORY_CARE_PROVIDER_SITE_OTHER): Payer: Medicare Other | Admitting: Neurology

## 2017-11-22 DIAGNOSIS — G5602 Carpal tunnel syndrome, left upper limb: Secondary | ICD-10-CM | POA: Diagnosis not present

## 2017-11-22 DIAGNOSIS — M5412 Radiculopathy, cervical region: Secondary | ICD-10-CM

## 2017-11-22 NOTE — Procedures (Signed)
[facility]  [address], [address]  [place], [place] [address] Tel: [phone] Fax:  [phone] Test Date:  11/22/2017  Patient: Carla HaskellBetty Stahly DOB: 1941-09-16 Physician: Nita Sickleonika Patel, DO  Sex: Female Height: 5\' 5"  Ref Phys: Maurice SmallElaine Griffin, MD  ID#: [id number] Temp: 35.6C Technician:    Patient Complaints: This is a 77 year old female referred for evaluation of left hand numbness and tingling.  NCV & EMG Findings: Extensive electrodiagnostic testing of the left upper extremity shows:  1. Left sensory response shows prolonged distal peak latency (6.3 ms) and reduced amplitude (5.7 V).  Left ulnar sensory response is within normal limits. 2. Left median motor response shows prolonged distal onset latency (5.7 ms) and normal amplitude. Left ulnar motor responses within normal limits. 3. Chronic motor axon loss changes are seen affecting the left abductor pollicis brevis as well as C8 innervated muscles. There is no evidence of accompanied active denervation.  Impression: 1. Left median neuropathy at or distal to the wrist, consistent with the clinical diagnosis of carpal tunnel syndrome. Overall, these findings are severe in degree electrically. 2. 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   ___________________________ Nita Sickleonika Patel, DO    Nerve Conduction Studies Anti Sensory Summary Table   Site NR Peak (ms) Norm Peak (ms) P-T Amp (V) Norm P-T Amp  Left Median Anti Sensory (2nd Digit)  35.6C  Wrist    6.3 <3.8 5.7 >10  Left Ulnar Anti Sensory (5th Digit)  35.6C  Wrist    2.6 <3.2 20.7 >5   Motor Summary Table   Site NR Onset (ms) Norm Onset (ms) O-P Amp (mV) Norm O-P Amp Site1 Site2 Delta-0 (ms) Dist (cm) Vel (m/s) Norm Vel (m/s)  Left Median Motor (Abd Poll Brev)  35.6C  Wrist    5.7 <4.0 7.0 >5 Elbow Wrist 4.6 27.0 59 >50  Elbow    10.3  6.1         Left Ulnar Motor (Abd Dig Minimi)  35.6C  Wrist     2.1 <3.1 7.4 >7 B Elbow Wrist 3.7 24.0 65 >50  B Elbow    5.8  7.3  A Elbow B Elbow 1.7 10.0 59 >50  A Elbow    7.5  6.9          EMG   Side Muscle Ins Act Fibs Psw Fasc Number Recrt Dur Dur. Amp Amp. Poly Poly. Comment  Left 1stDorInt Nml Nml Nml Nml 1- Rapid Some 1+ Some 1+ Nml Nml N/A  Left Ext Indicis Nml Nml Nml Nml 1- Rapid Some 1+ Some 1+ Nml Nml N/A  Left PronatorTeres Nml Nml Nml Nml Nml Nml Nml Nml Nml Nml Nml Nml N/A  Left Abd Poll Brev Nml Nml Nml Nml 2- Rapid Many 1+ Many 1+ Some 1+ N/A  Left Biceps Nml Nml Nml Nml Nml Nml Nml Nml Nml Nml Nml Nml N/A  Left Triceps Nml Nml Nml Nml 1- Rapid Some 1+ Some 1+ Nml Nml N/A  Left Deltoid Nml Nml Nml Nml Nml Nml Nml Nml Nml Nml Nml Nml N/A      Waveforms:

## 2018-01-23 ENCOUNTER — Other Ambulatory Visit: Payer: Self-pay | Admitting: Family Medicine

## 2018-01-23 DIAGNOSIS — M5412 Radiculopathy, cervical region: Secondary | ICD-10-CM

## 2018-02-02 ENCOUNTER — Encounter: Payer: Self-pay | Admitting: Radiology

## 2018-02-02 ENCOUNTER — Ambulatory Visit
Admission: RE | Admit: 2018-02-02 | Discharge: 2018-02-02 | Disposition: A | Discharge status: Home / Self Care | Payer: Medicare Other | Source: Ambulatory Visit | Attending: Family Medicine | Admitting: Family Medicine

## 2018-02-02 DIAGNOSIS — M5412 Radiculopathy, cervical region: Secondary | ICD-10-CM

## 2018-03-01 ENCOUNTER — Encounter (INDEPENDENT_AMBULATORY_CARE_PROVIDER_SITE_OTHER): Payer: Medicare Other | Admitting: Ophthalmology

## 2018-03-01 DIAGNOSIS — H338 Other retinal detachments: Secondary | ICD-10-CM

## 2018-03-01 DIAGNOSIS — H43813 Vitreous degeneration, bilateral: Secondary | ICD-10-CM | POA: Diagnosis not present

## 2018-03-08 ENCOUNTER — Ambulatory Visit (INDEPENDENT_AMBULATORY_CARE_PROVIDER_SITE_OTHER): Payer: Medicare Other | Admitting: Ophthalmology

## 2018-06-22 ENCOUNTER — Ambulatory Visit: Payer: Medicare Other | Admitting: Registered"

## 2018-08-21 ENCOUNTER — Ambulatory Visit (HOSPITAL_COMMUNITY)
Admission: AD | Admit: 2018-08-21 | Discharge: 2018-08-21 | Disposition: A | Discharge status: Home / Self Care | Payer: Medicare Other | Source: Ambulatory Visit | Attending: Ophthalmology | Admitting: Ophthalmology

## 2018-08-21 ENCOUNTER — Encounter (INDEPENDENT_AMBULATORY_CARE_PROVIDER_SITE_OTHER): Payer: Self-pay | Admitting: Ophthalmology

## 2018-08-21 ENCOUNTER — Ambulatory Visit (INDEPENDENT_AMBULATORY_CARE_PROVIDER_SITE_OTHER): Payer: Medicare Other | Admitting: Ophthalmology

## 2018-08-21 ENCOUNTER — Encounter (HOSPITAL_COMMUNITY)
Admission: AD | Disposition: A | Discharge status: Home / Self Care | Payer: Self-pay | Source: Ambulatory Visit | Attending: Ophthalmology

## 2018-08-21 ENCOUNTER — Ambulatory Visit (HOSPITAL_COMMUNITY): Payer: Medicare Other | Admitting: Anesthesiology

## 2018-08-21 ENCOUNTER — Other Ambulatory Visit: Payer: Self-pay

## 2018-08-21 ENCOUNTER — Encounter (HOSPITAL_COMMUNITY): Payer: Self-pay | Admitting: Anesthesiology

## 2018-08-21 DIAGNOSIS — H33022 Retinal detachment with multiple breaks, left eye: Secondary | ICD-10-CM | POA: Insufficient documentation

## 2018-08-21 DIAGNOSIS — Z7982 Long term (current) use of aspirin: Secondary | ICD-10-CM | POA: Insufficient documentation

## 2018-08-21 DIAGNOSIS — H3322 Serous retinal detachment, left eye: Secondary | ICD-10-CM | POA: Diagnosis not present

## 2018-08-21 DIAGNOSIS — Z888 Allergy status to other drugs, medicaments and biological substances status: Secondary | ICD-10-CM | POA: Diagnosis not present

## 2018-08-21 DIAGNOSIS — K219 Gastro-esophageal reflux disease without esophagitis: Secondary | ICD-10-CM | POA: Diagnosis not present

## 2018-08-21 DIAGNOSIS — I341 Nonrheumatic mitral (valve) prolapse: Secondary | ICD-10-CM | POA: Diagnosis not present

## 2018-08-21 DIAGNOSIS — Z8249 Family history of ischemic heart disease and other diseases of the circulatory system: Secondary | ICD-10-CM | POA: Insufficient documentation

## 2018-08-21 DIAGNOSIS — Z961 Presence of intraocular lens: Secondary | ICD-10-CM | POA: Diagnosis not present

## 2018-08-21 DIAGNOSIS — H3581 Retinal edema: Secondary | ICD-10-CM | POA: Diagnosis not present

## 2018-08-21 DIAGNOSIS — E785 Hyperlipidemia, unspecified: Secondary | ICD-10-CM | POA: Insufficient documentation

## 2018-08-21 DIAGNOSIS — Z880 Allergy status to penicillin: Secondary | ICD-10-CM | POA: Diagnosis not present

## 2018-08-21 DIAGNOSIS — F419 Anxiety disorder, unspecified: Secondary | ICD-10-CM | POA: Insufficient documentation

## 2018-08-21 DIAGNOSIS — Z8669 Personal history of other diseases of the nervous system and sense organs: Secondary | ICD-10-CM

## 2018-08-21 DIAGNOSIS — Z87891 Personal history of nicotine dependence: Secondary | ICD-10-CM | POA: Diagnosis not present

## 2018-08-21 DIAGNOSIS — M1990 Unspecified osteoarthritis, unspecified site: Secondary | ICD-10-CM | POA: Diagnosis not present

## 2018-08-21 DIAGNOSIS — H338 Other retinal detachments: Secondary | ICD-10-CM

## 2018-08-21 DIAGNOSIS — Z79899 Other long term (current) drug therapy: Secondary | ICD-10-CM | POA: Insufficient documentation

## 2018-08-21 DIAGNOSIS — Z882 Allergy status to sulfonamides status: Secondary | ICD-10-CM | POA: Diagnosis not present

## 2018-08-21 HISTORY — PX: PHOTOCOAGULATION WITH LASER: SHX6027

## 2018-08-21 HISTORY — PX: SCLERAL BUCKLE WITH POSSIBLE 25 GAUGE PARS PLANA VITRECTOMY: SHX6206

## 2018-08-21 HISTORY — PX: RETINAL DETACHMENT SURGERY: SHX105

## 2018-08-21 HISTORY — PX: GAS/FLUID EXCHANGE: SHX5334

## 2018-08-21 LAB — BASIC METABOLIC PANEL
Anion gap: 12 (ref 5–15)
BUN: 17 mg/dL (ref 8–23)
CO2: 22 mmol/L (ref 22–32)
Calcium: 9.7 mg/dL (ref 8.9–10.3)
Chloride: 102 mmol/L (ref 98–111)
Creatinine, Ser: 0.79 mg/dL (ref 0.44–1.00)
GFR calc Af Amer: >60 mL/min (ref >60)
GLUCOSE: 95 mg/dL (ref 70–99)
POTASSIUM: 4 mmol/L (ref 3.5–5.1)
Sodium: 136 mmol/L (ref 135–145)

## 2018-08-21 SURGERY — SCLERAL BUCKLE WITH POSSIBLE 25 GAUGE PARS PLANA VITRECTOMY
Anesthesia: General | Site: Eye | Laterality: Left

## 2018-08-21 MED ORDER — BSS PLUS IO SOLN
INTRAOCULAR | Status: AC
  Filled 2018-08-21: qty 500

## 2018-08-21 MED ORDER — DEXAMETHASONE SODIUM PHOSPHATE 10 MG/ML IJ SOLN
INTRAMUSCULAR | Status: DC | PRN
  Administered 2018-08-21: 10 mg

## 2018-08-21 MED ORDER — FENTANYL CITRATE (PF) 100 MCG/2ML IJ SOLN: 25.0000 ug | INTRAMUSCULAR | Status: DC | PRN

## 2018-08-21 MED ORDER — PROPOFOL 10 MG/ML IV BOLUS
INTRAVENOUS | Status: DC | PRN
  Administered 2018-08-21: 50 mg via INTRAVENOUS
  Administered 2018-08-21: 150 mg via INTRAVENOUS

## 2018-08-21 MED ORDER — PHENYLEPHRINE HCL 10 % OP SOLN
1.0000 [drp] | OPHTHALMIC | Status: AC | PRN
  Administered 2018-08-21 (×3): 1 [drp] via OPHTHALMIC
  Filled 2018-08-21: qty 5

## 2018-08-21 MED ORDER — EPINEPHRINE PF 1 MG/ML IJ SOLN
INTRAOCULAR | Status: DC | PRN
  Administered 2018-08-21: 500 mL

## 2018-08-21 MED ORDER — FENTANYL CITRATE (PF) 250 MCG/5ML IJ SOLN
INTRAMUSCULAR | Status: AC
  Filled 2018-08-21: qty 5

## 2018-08-21 MED ORDER — STERILE WATER FOR IRRIGATION IR SOLN
Status: DC | PRN
  Administered 2018-08-21: 1000 mL

## 2018-08-21 MED ORDER — PROPOFOL 10 MG/ML IV BOLUS
INTRAVENOUS | Status: AC
  Filled 2018-08-21: qty 20

## 2018-08-21 MED ORDER — STERILE WATER FOR INJECTION IJ SOLN
INTRAMUSCULAR | Status: DC | PRN
  Administered 2018-08-21: 20 mL

## 2018-08-21 MED ORDER — HYDROCODONE-ACETAMINOPHEN 5-325 MG PO TABS: 1.0000 | ORAL_TABLET | ORAL | 0 refills | Status: DC | PRN

## 2018-08-21 MED ORDER — EPINEPHRINE PF 1 MG/ML IJ SOLN
INTRAMUSCULAR | Status: AC
  Filled 2018-08-21: qty 1

## 2018-08-21 MED ORDER — SODIUM CHLORIDE 0.9 % IV SOLN
INTRAVENOUS | Status: DC
  Administered 2018-08-21: 16:00:00 via INTRAVENOUS

## 2018-08-21 MED ORDER — ONDANSETRON HCL 4 MG/2ML IJ SOLN
INTRAMUSCULAR | Status: AC
  Filled 2018-08-21: qty 2

## 2018-08-21 MED ORDER — BSS IO SOLN
INTRAOCULAR | Status: AC
  Filled 2018-08-21: qty 15

## 2018-08-21 MED ORDER — TRIAMCINOLONE ACETONIDE 40 MG/ML IJ SUSP
INTRAMUSCULAR | Status: DC | PRN
  Administered 2018-08-21: 40 mg

## 2018-08-21 MED ORDER — ATROPINE SULFATE 1 % OP SOLN
OPHTHALMIC | Status: AC
  Filled 2018-08-21: qty 5

## 2018-08-21 MED ORDER — BACITRACIN-POLYMYXIN B 500-10000 UNIT/GM OP OINT
TOPICAL_OINTMENT | OPHTHALMIC | Status: DC | PRN
  Administered 2018-08-21: 1 via OPHTHALMIC

## 2018-08-21 MED ORDER — LACTATED RINGERS IV SOLN
INTRAVENOUS | Status: DC | PRN
  Administered 2018-08-21: 18:00:00 via INTRAVENOUS

## 2018-08-21 MED ORDER — LIDOCAINE HCL (PF) 2 % IJ SOLN
INTRAMUSCULAR | Status: DC | PRN
  Administered 2018-08-21: 5 mL

## 2018-08-21 MED ORDER — DEXAMETHASONE SODIUM PHOSPHATE 10 MG/ML IJ SOLN
INTRAMUSCULAR | Status: AC
  Filled 2018-08-21: qty 1

## 2018-08-21 MED ORDER — BRIMONIDINE TARTRATE 0.2 % OP SOLN
OPHTHALMIC | Status: DC | PRN
  Administered 2018-08-21: 1 [drp] via OPHTHALMIC

## 2018-08-21 MED ORDER — FENTANYL CITRATE (PF) 250 MCG/5ML IJ SOLN
INTRAMUSCULAR | Status: DC | PRN
  Administered 2018-08-21: 25 ug via INTRAVENOUS
  Administered 2018-08-21: 100 ug via INTRAVENOUS
  Administered 2018-08-21: 50 ug via INTRAVENOUS
  Administered 2018-08-21: 25 ug via INTRAVENOUS
  Administered 2018-08-21: 50 ug via INTRAVENOUS

## 2018-08-21 MED ORDER — PREDNISOLONE ACETATE 1 % OP SUSP
OPHTHALMIC | Status: AC
  Filled 2018-08-21: qty 5

## 2018-08-21 MED ORDER — ONDANSETRON HCL 4 MG/2ML IJ SOLN
INTRAMUSCULAR | Status: DC | PRN
  Administered 2018-08-21: 4 mg via INTRAVENOUS

## 2018-08-21 MED ORDER — TRIAMCINOLONE ACETONIDE 40 MG/ML IJ SUSP
INTRAMUSCULAR | Status: AC
  Filled 2018-08-21: qty 5

## 2018-08-21 MED ORDER — LIDOCAINE 2% (20 MG/ML) 5 ML SYRINGE
INTRAMUSCULAR | Status: DC | PRN
  Administered 2018-08-21: 80 mg via INTRAVENOUS

## 2018-08-21 MED ORDER — ATROPINE SULFATE 1 % OP SOLN
1.0000 [drp] | OPHTHALMIC | Status: AC | PRN
  Administered 2018-08-21 (×3): 1 [drp] via OPHTHALMIC
  Filled 2018-08-21: qty 5

## 2018-08-21 MED ORDER — GATIFLOXACIN 0.5 % OP SOLN
OPHTHALMIC | Status: DC | PRN
  Administered 2018-08-21: 1 [drp] via OPHTHALMIC

## 2018-08-21 MED ORDER — BACITRACIN-POLYMYXIN B 500-10000 UNIT/GM OP OINT
TOPICAL_OINTMENT | OPHTHALMIC | Status: AC
  Filled 2018-08-21: qty 3.5

## 2018-08-21 MED ORDER — SUGAMMADEX SODIUM 200 MG/2ML IV SOLN
INTRAVENOUS | Status: DC | PRN
  Administered 2018-08-21: 150 mg via INTRAVENOUS

## 2018-08-21 MED ORDER — TROPICAMIDE 1 % OP SOLN
1.0000 [drp] | OPHTHALMIC | Status: AC | PRN
  Administered 2018-08-21 (×3): 1 [drp] via OPHTHALMIC
  Filled 2018-08-21: qty 15

## 2018-08-21 MED ORDER — DORZOLAMIDE HCL-TIMOLOL MAL 2-0.5 % OP SOLN
OPHTHALMIC | Status: AC
  Filled 2018-08-21: qty 10

## 2018-08-21 MED ORDER — ACETAMINOPHEN 10 MG/ML IV SOLN: 1000.0000 mg | Freq: Once | INTRAVENOUS | Status: DC | PRN

## 2018-08-21 MED ORDER — 0.9 % SODIUM CHLORIDE (POUR BTL) OPTIME
TOPICAL | Status: DC | PRN
  Administered 2018-08-21: 1000 mL

## 2018-08-21 MED ORDER — POLYMYXIN B SULFATE 500000 UNITS IJ SOLR
INTRAMUSCULAR | Status: AC
  Filled 2018-08-21: qty 500000

## 2018-08-21 MED ORDER — BUPIVACAINE HCL (PF) 0.75 % IJ SOLN
INTRAMUSCULAR | Status: DC | PRN
  Administered 2018-08-21: 5 mL

## 2018-08-21 MED ORDER — ATROPINE SULFATE 1 % OP SOLN
OPHTHALMIC | Status: DC | PRN
  Administered 2018-08-21: 1 [drp] via OPHTHALMIC

## 2018-08-21 MED ORDER — DORZOLAMIDE HCL-TIMOLOL MAL 2-0.5 % OP SOLN
OPHTHALMIC | Status: DC | PRN
  Administered 2018-08-21: 1 [drp] via OPHTHALMIC

## 2018-08-21 MED ORDER — GATIFLOXACIN 0.5 % OP SOLN
OPHTHALMIC | Status: AC
  Filled 2018-08-21: qty 2.5

## 2018-08-21 MED ORDER — PROPOFOL 500 MG/50ML IV EMUL
INTRAVENOUS | Status: DC | PRN
  Administered 2018-08-21: 150 ug/kg/min via INTRAVENOUS

## 2018-08-21 MED ORDER — BUPIVACAINE HCL (PF) 0.75 % IJ SOLN
INTRAMUSCULAR | Status: AC
  Filled 2018-08-21: qty 10

## 2018-08-21 MED ORDER — SODIUM HYALURONATE 10 MG/ML IO SOLN
INTRAOCULAR | Status: AC
  Filled 2018-08-21: qty 0.85

## 2018-08-21 MED ORDER — CEFTAZIDIME 1 G IJ SOLR
INTRAMUSCULAR | Status: AC
  Filled 2018-08-21: qty 1

## 2018-08-21 MED ORDER — NA CHONDROIT SULF-NA HYALURON 40-30 MG/ML IO SOLN
INTRAOCULAR | Status: DC | PRN
  Administered 2018-08-21: 0.5 mL via INTRAOCULAR

## 2018-08-21 MED ORDER — ROCURONIUM BROMIDE 10 MG/ML (PF) SYRINGE
PREFILLED_SYRINGE | INTRAVENOUS | Status: DC | PRN
  Administered 2018-08-21 (×2): 10 mg via INTRAVENOUS
  Administered 2018-08-21: 20 mg via INTRAVENOUS
  Administered 2018-08-21: 50 mg via INTRAVENOUS
  Administered 2018-08-21: 10 mg via INTRAVENOUS

## 2018-08-21 MED ORDER — BSS IO SOLN
INTRAOCULAR | Status: DC | PRN
  Administered 2018-08-21: 15 mL

## 2018-08-21 MED ORDER — TOBRAMYCIN-DEXAMETHASONE 0.3-0.1 % OP OINT
TOPICAL_OINTMENT | OPHTHALMIC | Status: AC
  Filled 2018-08-21: qty 3.5

## 2018-08-21 MED ORDER — DEXAMETHASONE SODIUM PHOSPHATE 10 MG/ML IJ SOLN
INTRAMUSCULAR | Status: DC | PRN
  Administered 2018-08-21: 10 mg via INTRAVENOUS

## 2018-08-21 MED ORDER — NA CHONDROIT SULF-NA HYALURON 40-30 MG/ML IO SOLN
INTRAOCULAR | Status: AC
  Filled 2018-08-21: qty 1

## 2018-08-21 MED ORDER — BRIMONIDINE TARTRATE 0.2 % OP SOLN
OPHTHALMIC | Status: AC
  Filled 2018-08-21: qty 5

## 2018-08-21 MED ORDER — STERILE WATER FOR INJECTION IJ SOLN
INTRAMUSCULAR | Status: AC
  Filled 2018-08-21: qty 10

## 2018-08-21 MED ORDER — SODIUM CHLORIDE 0.9 % IJ SOLN
INTRAMUSCULAR | Status: AC
  Filled 2018-08-21: qty 10

## 2018-08-21 MED ORDER — PROPARACAINE HCL 0.5 % OP SOLN
1.0000 [drp] | OPHTHALMIC | Status: AC | PRN
  Administered 2018-08-21 (×3): 1 [drp] via OPHTHALMIC
  Filled 2018-08-21: qty 15

## 2018-08-21 MED ORDER — LIDOCAINE 2% (20 MG/ML) 5 ML SYRINGE
INTRAMUSCULAR | Status: AC
  Filled 2018-08-21: qty 5

## 2018-08-21 MED ORDER — PREDNISOLONE ACETATE 1 % OP SUSP
OPHTHALMIC | Status: DC | PRN
  Administered 2018-08-21: 1 [drp] via OPHTHALMIC

## 2018-08-21 MED ORDER — LIDOCAINE HCL (PF) 2 % IJ SOLN
INTRAMUSCULAR | Status: AC
  Filled 2018-08-21: qty 10

## 2018-08-21 SURGICAL SUPPLY — 48 items
APPLICATOR COTTON TIP 6 STRL (MISCELLANEOUS) ×4 IMPLANT
APPLICATOR COTTON TIP 6IN STRL (MISCELLANEOUS) ×8
BAND SCLERAL BUCKLING TYPE 41 (Ophthalmic Related) ×2 IMPLANT
BANDAGE EYE OVAL (MISCELLANEOUS) ×2 IMPLANT
BETADINE 5% OPHTHALMIC (OPHTHALMIC) ×2 IMPLANT
CANNULA FLEX TIP 25G (CANNULA) ×2 IMPLANT
COVER SURGICAL LIGHT HANDLE (MISCELLANEOUS) ×2 IMPLANT
DRAPE MICROSCOPE LEICA 46X105 (MISCELLANEOUS) ×2 IMPLANT
DRAPE OPHTHALMIC 77X100 STRL (CUSTOM PROCEDURE TRAY) ×2 IMPLANT
GAS AUTO FILL CONSTEL (OPHTHALMIC) ×2
GAS AUTO FILL CONSTELLATION (OPHTHALMIC) ×1 IMPLANT
GLOVE BIO SURGEON STRL SZ7.5 (GLOVE) ×2 IMPLANT
GLOVE BIOGEL M 7.0 STRL (GLOVE) ×2 IMPLANT
GLOVE SS BIOGEL STRL SZ 7 (GLOVE) ×1 IMPLANT
GLOVE SUPERSENSE BIOGEL SZ 7 (GLOVE) ×1
GLOVE SURG SS PI 6.5 STRL IVOR (GLOVE) ×2 IMPLANT
GOWN STRL REUS W/ TWL LRG LVL3 (GOWN DISPOSABLE) ×2 IMPLANT
GOWN STRL REUS W/ TWL XL LVL3 (GOWN DISPOSABLE) ×1 IMPLANT
GOWN STRL REUS W/TWL LRG LVL3 (GOWN DISPOSABLE) ×2
GOWN STRL REUS W/TWL XL LVL3 (GOWN DISPOSABLE) ×1
KIT BASIN OR (CUSTOM PROCEDURE TRAY) ×2 IMPLANT
KIT PERFLUORON PROCEDURE 5ML (MISCELLANEOUS) ×2 IMPLANT
NEEDLE 18GX1X1/2 (RX/OR ONLY) (NEEDLE) ×2 IMPLANT
NEEDLE 25GX 5/8IN NON SAFETY (NEEDLE) ×6 IMPLANT
NEEDLE HYPO 30X.5 LL (NEEDLE) ×4 IMPLANT
NS IRRIG 1000ML POUR BTL (IV SOLUTION) ×2 IMPLANT
OPHTHALMIC BETADINE 5% (OPHTHALMIC) ×2
PACK VITRECTOMY CUSTOM (CUSTOM PROCEDURE TRAY) ×2 IMPLANT
PAD ARMBOARD 7.5X6 YLW CONV (MISCELLANEOUS) ×4 IMPLANT
PAK PIK VITRECTOMY CVS 25GA (OPHTHALMIC) ×2 IMPLANT
PIC ILLUMINATED 25G (OPHTHALMIC)
PIK ILLUMINATED 25G (OPHTHALMIC) IMPLANT
PROBE ENDO DIATHERMY 25G (MISCELLANEOUS) ×2 IMPLANT
PROBE LASER ILLUM FLEX CVD 25G (OPHTHALMIC) ×2 IMPLANT
REPL STRA BRUSH NEEDLE (NEEDLE) IMPLANT
RESERVOIR BACK FLUSH (MISCELLANEOUS) IMPLANT
SCRAPER DIAMOND 25GA (OPHTHALMIC RELATED) IMPLANT
STOPCOCK 4 WAY LG BORE MALE ST (IV SETS) IMPLANT
SUT ETHILON 5.0 S-24 (SUTURE) ×2 IMPLANT
SUT SILK 2 0 (SUTURE) ×1
SUT SILK 2-0 18XBRD TIE 12 (SUTURE) ×1 IMPLANT
SUT VICRYL 7 0 TG140 8 (SUTURE) ×2 IMPLANT
SYR 20CC LL (SYRINGE) ×4 IMPLANT
SYR 5ML LL (SYRINGE) ×2 IMPLANT
SYR TB 1ML LUER SLIP (SYRINGE) ×4 IMPLANT
TOWEL NATURAL 6PK STERILE (DISPOSABLE) ×2 IMPLANT
TRAY FOLEY CATH 14FR (SET/KITS/TRAYS/PACK) ×2 IMPLANT
WATER STERILE IRR 1000ML POUR (IV SOLUTION) ×2 IMPLANT

## 2018-08-21 NOTE — Anesthesia Procedure Notes (Signed)
Procedure Name: Intubation Date/Time: 08/21/2018 3:59 PM Performed by: Bryson Corona, CRNA Pre-anesthesia Checklist: Patient identified, Emergency Drugs available, Suction available and Patient being monitored Patient Re-evaluated:Patient Re-evaluated prior to induction Oxygen Delivery Method: Circle System Utilized Preoxygenation: Pre-oxygenation with 100% oxygen Induction Type: IV induction Ventilation: Mask ventilation without difficulty Laryngoscope Size: Mac and 3 Grade View: Grade II Tube type: Oral Tube size: 7.0 mm Number of attempts: 1 Airway Equipment and Method: Stylet and Oral airway Placement Confirmation: ETT inserted through vocal cords under direct vision,  positive ETCO2 and breath sounds checked- equal and bilateral Secured at: 22 cm Tube secured with: Tape Dental Injury: Teeth and Oropharynx as per pre-operative assessment

## 2018-08-21 NOTE — H&P (Signed)
Carla May is an 77 y.o. female.    Chief Complaint: decreased vision OS  HPI: Pt reports 3 wk history of floaters and shadow in supero nasal visual field. On dilated exam, noted to have inferior retinal detachment w/ retinal tear at 0500.  Past Medical History:  Diagnosis Date  . Anxiety   . Arthritis   . GERD (gastroesophageal reflux disease)   . History of palpitations   . Hyperlipidemia   . MVP (mitral valve prolapse)     Past Surgical History:  Procedure Laterality Date  . ABDOMINAL HYSTERECTOMY  1970's  . CATARACT EXTRACTION    . EYE SURGERY Bilateral   . GAS INSERTION Right 11/26/2014   Procedure: INSERTION OF GAS;  Surgeon: Sherrie George, MD;  Location: [facility] OR;  Service: Ophthalmology;  Laterality: Right;  C3F8  . KNEE ARTHROSCOPY Left   . PHOTOCOAGULATION WITH LASER Right 11/26/2014   Procedure: PHOTOCOAGULATION WITH LASER;  Surgeon: Sherrie George, MD;  Location[facility] OR;  Service: Ophthalmology;  Laterality: Right;  . RETINAL DETACHMENT SURGERY    . SCLERAL BUCKLE Right 11/26/2014   Procedure: SCLERAL BUCKLE RIGHT EYE ;  Surgeon: Sherrie George, MD;  Location: [facility] OR;  Service: Ophthalmology;  Laterality: Right;    Family History  Problem Relation Age of Onset  . Cancer Mother   . Heart failure Father    Social History:  reports that she has quit smoking. She quit after 10.00 years of use. She has never used smokeless tobacco. She reports that she does not drink alcohol or use drugs.  Allergies:  Allergies  Allergen Reactions  . Penicillins Itching and Nausea Only  . Sulfa Antibiotics Nausea Only    No medications prior to admission.    Review of systems otherwise negative  There were no vitals taken for this visit.  Physical exam: Mental status: oriented x3. Eyes: See eye exam associated with this date of surgery Ears, Nose, Throat: within normal limits Neck: Within Normal limits General: within normal limits Chest: Within normal  limits Breast: deferred Heart: Within normal limits Abdomen: Within normal limits GU: deferred Extremities: within normal limits Skin: within normal limits  Assessment/Plan 1. Macula-sparing inferior retinal detachment OS - inferior detachment from 0300-0600 - HST at 0500 and small tears at 0300 - macula attached  Plan: To [facility] for scleral buckle + 25g pars plana vitrectomy OS under general anesthesia  Karie Chimera, M.D., Ph.D. Vitreoretinal Surgeon [facility]

## 2018-08-21 NOTE — Anesthesia Preprocedure Evaluation (Addendum)
Anesthesia Evaluation  Patient identified by MRN, date of birth, ID band Patient awake    Reviewed: Allergy & Precautions, NPO status , Patient's Chart, lab work & pertinent test results  History of Anesthesia Complications (+) PONV and history of anesthetic complications  Airway Mallampati: II  TM Distance: >3 FB Neck ROM: Full    Dental no notable dental hx. (+) Teeth Intact, Dental Advisory Given   Pulmonary former smoker,    Pulmonary exam normal breath sounds clear to auscultation       Cardiovascular Normal cardiovascular exam+ Valvular Problems/Murmurs MVP  Rhythm:Regular Rate:Normal     Neuro/Psych Anxiety negative neurological ROS     GI/Hepatic Neg liver ROS, GERD  Controlled and Medicated,  Endo/Other  negative endocrine ROS  Renal/GU negative Renal ROS  negative genitourinary   Musculoskeletal  (+) Arthritis , Osteoarthritis,    Abdominal Normal abdominal exam  (+)   Peds  Hematology negative hematology ROS (+)   Anesthesia Other Findings   Reproductive/Obstetrics negative OB ROS                            Anesthesia Physical Anesthesia Plan  ASA: II  Anesthesia Plan: General   Post-op Pain Management:    Induction: Intravenous  PONV Risk Score and Plan: 3 and 4 or greater and Ondansetron, Treatment may vary due to age or medical condition, Dexamethasone, TIVA and Propofol infusion  Airway Management Planned: Oral ETT  Additional Equipment:   Intra-op Plan:   Post-operative Plan: Extubation in OR  Informed Consent: I have reviewed the patients History and Physical, chart, labs and discussed the procedure including the risks, benefits and alternatives for the proposed anesthesia with the patient or authorized representative who has indicated his/her understanding and acceptance.   Dental advisory given  Plan Discussed with: CRNA and Surgeon  Anesthesia Plan  Comments: (Hx of severe PONV requiring hospitalization, plan TIVA + decadron/zofran/scopolamine patch Ate breakfast at 07:30 today, plan to go back to OR at or after 15:30.)      Anesthesia Quick Evaluation

## 2018-08-21 NOTE — Transfer of Care (Signed)
Immediate Anesthesia Transfer of Care Note  Patient: Carla May  Procedure(s) Performed: SCLERAL BUCKLE WITH 25 GAUGE PARS PLANA VITRECTOMY (Left Eye) PHOTOCOAGULATION WITH LASER (Left Eye) GAS/FLUID EXCHANGE (Left Eye)  Patient Location: PACU  Anesthesia Type:General  Level of Consciousness: awake  Airway & Oxygen Therapy: Patient Spontanous Breathing and Patient connected to nasal cannula oxygen  Post-op Assessment: Report given to RN and Post -op Vital signs reviewed and stable  Post vital signs: Reviewed and stable  Last Vitals:  Vitals Value Taken Time  BP 142/86 08/21/2018  7:10 PM  Temp    Pulse 103 08/21/2018  7:12 PM  Resp 18 08/21/2018  7:12 PM  SpO2 98 % 08/21/2018  7:12 PM  Vitals shown include unvalidated device data.  Last Pain:  Vitals:   08/21/18 1303  TempSrc: Oral         Complications: No apparent anesthesia complications

## 2018-08-21 NOTE — Brief Op Note (Signed)
08/21/2018  7:09 PM  PATIENT:  Carla May  77 y.o. female  PRE-OPERATIVE DIAGNOSIS:  retinal detachment, left eye  POST-OPERATIVE DIAGNOSIS:  retinal detachment, left eye  PROCEDURE:  Procedure(s) with comments: SCLERAL BUCKLE WITH 25 GAUGE PARS PLANA VITRECTOMY (Left) PHOTOCOAGULATION WITH LASER (Left) GAS/FLUID EXCHANGE (Left) - C3F8  SURGEON:  Surgeon(s) and Role:    Rennis Chris, MD - Primary  ASSISTANTS: Virgilio Belling, COA   ANESTHESIA:   local and general  EBL:  minimal   BLOOD ADMINISTERED:none  DRAINS: none   LOCAL MEDICATIONS USED:  BUPIVICAINE , LIDOCAINE  and Amount: 10 ml  SPECIMEN:  No Specimen  DISPOSITION OF SPECIMEN:  N/A  COUNTS:  YES  TOURNIQUET:  * No tourniquets in log *  DICTATION: .Note written in EPIC  PLAN OF CARE: Discharge to home after PACU  PATIENT DISPOSITION:  PACU - hemodynamically stable.   Delay start of Pharmacological VTE agent (>24hrs) due to surgical blood loss or risk of bleeding: not applicable

## 2018-08-21 NOTE — Discharge Instructions (Addendum)
POSTOPERATIVE INSTRUCTIONS  Your doctor has performed vitreoretinal surgery on you at [facility].  - Keep eye patched and shielded until seen by Dr. Zamora 830 AM tomorrow in clinic - Do not use drops until return - FACE DOWN POSITIONING WHILE AWAKE - Sleep with belly down or on right side, avoid laying flat on back.    - No strenuous bending, stooping or lifting.  - You may not drive until further notice.  - If your doctor used a gas bubble in your eye during the procedure he will advise you on postoperative positioning. If you have a gas bubble you will be wearing a green bracelet that was applied in the operating room. The green bracelet should stay on as long as the gas bubble is in your eye. While the gas bubble is present you should not fly in an airplane. If you require general anesthesia while the gas bubble is present you must notify your anesthesiologist that an intraocular gas bubble is present so he can take the appropriate precautions.  - Tylenol or any other over-the-counter pain reliever can be used according to your doctor. If more pain medicine is required, your doctor will have a prescription for you.  - You may read, go up and down stairs, and watch television.     Brian Zamora, M.D., Ph.D.  

## 2018-08-21 NOTE — Progress Notes (Signed)
[facility] - Clinic Note  08/21/2018     CHIEF COMPLAINT Patient presents for Retina Evaluation   HISTORY OF PRESENT ILLNESS: Carla May is a 77 y.o. female who presents to the clinic today for:   HPI    Retina Evaluation    In both eyes.  This started 3 weeks ago.  Associated Symptoms Flashes, Photophobia, Pain and Floaters.  Negative for Blind Spot, Scalp Tenderness, Fever, Glare, Jaw Claudication, Weight Loss, Distortion, Redness, Trauma, Shoulder/Hip pain and Fatigue.  Context:  near vision, mid-range vision and distance vision.  Treatments tried include injection.  Response to treatment was significant improvement.  I, the attending physician,  performed the HPI with the patient and updated documentation appropriately.          Comments    Referral of Dr. Laural Benes for retina eval/ Previous patient of DR. Matthews and dx/tx for retina detachment several yrs ago.Patient states appx 3 weeks ago she had flash of light OS followed by a bunch of black dots, the next day she had ov with Dr. Laural Benes, Dr. Laural Benes did not note any eye issues and told patient to return for F/U last week per patient. Patient states she has not noticed any flashes/floaters but yesterday she noticed shadows OS @ 1 o'clock . Pt has occasional achy pain back of her eyes Ou, light sensitivity OU, and watery eyes for a long time per patient . Patient is using Refresh and is taking Fish oil ND MULTIVITAMINS qd       Last edited by Rennis Chris, MD on 08/21/2018  8:48 AM. (History)    Pt states she sees Dr. Laural Benes routinely; Pt states she was seeing a flashing light x 3 weeks ago OS; Pt states flashes were then followed by floaters and a type of shadow in superior field of VA; Pt states last time she ate or drank was 1 hour ago at 0900 AM;   Referring physician: Canary Brim, MD [address] [place], [place] [address]  HISTORICAL INFORMATION:   Selected notes from the medical  record:  Referred by Dr. Ashok Cordia for concern of retinal hole LEE: Josefa Half) [BCVA: OD: OS:] Ocular Hx- PMH-anxiety, arthritis, HLD    CURRENT MEDICATIONS: Current Outpatient Medications (Ophthalmic Drugs)  Medication Sig  . bacitracin-polymyxin b (POLYSPORIN) ophthalmic ointment Place 1 application into the right eye 3 (three) times daily. apply to eye every 8 hours while awake (Patient not taking: Reported on 08/21/2018)  . brimonidine (ALPHAGAN) 0.2 % ophthalmic solution Place 1 drop into the right eye 2 (two) times daily. (Patient not taking: Reported on 08/21/2018)  . gatifloxacin (ZYMAXID) 0.5 % SOLN Place 1 drop into the right eye 4 (four) times daily. (Patient not taking: Reported on 08/21/2018)  . latanoprost (XALATAN) 0.005 % ophthalmic solution Place 1 drop into the right eye at bedtime. (Patient not taking: Reported on 08/21/2018)  . prednisoLONE acetate (PRED FORTE) 1 % ophthalmic suspension Place 1 drop into the right eye 4 (four) times daily. (Patient not taking: Reported on 08/21/2018)   No current facility-administered medications for this visit.  (Ophthalmic Drugs)   Current Outpatient Medications (Other)  Medication Sig  . acetaminophen (TYLENOL ARTHRITIS PAIN) 650 MG CR tablet Take 1,300 mg by mouth every 8 (eight) hours as needed for pain.  Marland Kitchen aspirin EC 81 MG tablet Take 81 mg by mouth daily.  . Multiple Vitamins-Minerals (MULTIVITAMIN PO) Take 1 tablet by mouth daily.  . Omega-3 Fatty  Acids (FISH OIL) 1000 MG CAPS Take 1,000 mg by mouth daily.  Marland Kitchen omeprazole (PRILOSEC OTC) 20 MG tablet Take 20 mg by mouth daily.  Marland Kitchen ezetimibe (ZETIA) 10 MG tablet Take 10 mg by mouth daily.   No current facility-administered medications for this visit.  (Other)      REVIEW OF SYSTEMS: ROS    Negative for: Constitutional, Gastrointestinal, Neurological, Skin, Genitourinary, Musculoskeletal, HENT, Endocrine, Cardiovascular, Eyes, Respiratory, Psychiatric, Allergic/Imm, Heme/Lymph    Last edited by Eldridge Scot, LPN on 96/0/4540  8:38 AM. (History)       ALLERGIES Allergies  Allergen Reactions  . Penicillins Itching and Nausea Only  . Sulfa Antibiotics Nausea Only    PAST MEDICAL HISTORY Past Medical History:  Diagnosis Date  . Anxiety   . Arthritis   . GERD (gastroesophageal reflux disease)   . History of palpitations   . Hyperlipidemia   . MVP (mitral valve prolapse)    Past Surgical History:  Procedure Laterality Date  . ABDOMINAL HYSTERECTOMY  1970's  . CATARACT EXTRACTION    . EYE SURGERY Bilateral   . GAS INSERTION Right 11/26/2014   Procedure: INSERTION OF GAS;  Surgeon: Sherrie George, MD;  Location: [facility] OR;  Service: Ophthalmology;  Laterality: Right;  C3F8  . KNEE ARTHROSCOPY Left   . PHOTOCOAGULATION WITH LASER Right 11/26/2014   Procedure: PHOTOCOAGULATION WITH LASER;  Surgeon: Sherrie George, MD;  Location[facility] OR;  Service: Ophthalmology;  Laterality: Right;  . RETINAL DETACHMENT SURGERY    . SCLERAL BUCKLE Right 11/26/2014   Procedure: SCLERAL BUCKLE RIGHT EYE ;  Surgeon: Sherrie George, MD;  Location: [facility] OR;  Service: Ophthalmology;  Laterality: Right;    FAMILY HISTORY Family History  Problem Relation Age of Onset  . Cancer Mother   . Heart failure Father     SOCIAL HISTORY Social History   Tobacco Use  . Smoking status: Former Smoker    Years: 10.00  . Smokeless tobacco: Never Used  . Tobacco comment: quit in 1970's  Substance Use Topics  . Alcohol use: No  . Drug use: No         OPHTHALMIC EXAM:  Base Eye Exam    Visual Acuity (Snellen - Linear)      Right Left   Dist cc 20/60 20/25   Dist ph cc 20/40 +1 20/25 +1   Correction:  Glasses       Tonometry (Tonopen, 8:25 AM)      Right Left   Pressure 17 17       Pupils      Dark Light Shape React APD   Right 3 2 Round Brisk None   Left 3 3 Round Brisk None       Visual Fields (Counting fingers)      Left Right    Full Full       Extraocular  Movement      Right Left    Full, Ortho Full, Ortho       Neuro/Psych    Oriented x3:  Yes   Mood/Affect:  Normal       Dilation    Both eyes:  1.0% Mydriacyl, 2.5% Phenylephrine @ 8:25 AM        Slit Lamp and Fundus Exam    Slit Lamp Exam      Right Left   Lids/Lashes Dermatochalasis - upper lid, Meibomian gland dysfunction, Telangiectasia Dermatochalasis - upper lid, Meibomian gland dysfunction, Telangiectasia   Conjunctiva/Sclera post surgical  changes, healed nicely White and quiet   Cornea Debris in tear film, 2+ Punctate epithelial erosions, Temporal Well healed cataract wounds 2+ Punctate epithelial erosions   Anterior Chamber Deep and quiet Deep and quiet   Iris Round and dilated Round and dilated   Lens PC IOL in good position, trace PCO PC IOL in good position   Vitreous Vitreous syneresis, mild pigment in anterior vitreous, Posterior vitreous detachment Vitreous syneresis, mild pigment in anterior vitreous       Fundus Exam      Right Left   Disc Pink and Sharp Compact, mildly Tilted disc, Peripapillary atrophy   C/D Ratio 0.2 0.3   Macula Blunted foveal reflex, Retinal pigment epithelial mottling, No heme or edema Blunted foveal reflex, attached; Retinal pigment epithelial mottling, No heme or edema   Vessels Vascular attenuation Vascular attenuation   Periphery Attached over scleral buckle, good buckle height, good scarring over buckle Inferior retinal detachment from 3-6 oclock; horseshoe tear at 0500, two small holes at 0300        Refraction    Wearing Rx      Sphere Cylinder Axis Add   Right -1.00 Sphere  +2.50   Left [place] +1.00 025 +2.50       Manifest Refraction      Sphere Cylinder Axis Dist VA   Right -2.50 Sphere  20/40+   Left -0.50 +1.00 045 20/40          IMAGING AND PROCEDURES  Imaging and Procedures for @TODAY @  OCT, Retina - OU - Both Eyes       Right Eye Quality was good. Central Foveal Thickness: 290. Progression has no prior  data. Findings include normal foveal contour, no IRF, no SRF, epiretinal membrane.   Left Eye Central Foveal Thickness: 259. Progression has no prior data. Findings include normal foveal contour, no IRF, no SRF.   Notes *Images captured and stored on drive  Diagnosis / Impression:  OD: mild ERM; NFP; no IRF/SRF OS: NFP; no IRF/SRF  Clinical management:  See below  Abbreviations: NFP - Normal foveal profile. CME - cystoid macular edema. PED - pigment epithelial detachment. IRF - intraretinal fluid. SRF - subretinal fluid. EZ - ellipsoid zone. ERM - epiretinal membrane. ORA - outer retinal atrophy. ORT - outer retinal tubulation. SRHM - subretinal hyper-reflective material                 ASSESSMENT/PLAN:    ICD-10-CM   1. 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 Retinal edema H35.81 OCT, Retina - OU - Both Eyes  3. History of retinal detachment Z86.69   4. 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 Retinal detachment, OS  The incidence, risk factors, and natural history of retinal detachment was discussed with patient.  Potential treatment options including delimiting laser, pneumatic retinopexy, scleral buckle, and vitrectomy, cryotherapy and laser, and the use of air, gas, and oil discussed with patient.  The risks of blindness, loss of vision, infection, hemorrhage, cataract progression or lens displacement were discussed with patient. - macula-sparing inferior retinal detachment from 3-6 oclock - large HST at 0500 and 2 small tears at 300 within the detached retina - recommend urgent SBP + PPV w/ endolaser and gas OS under general anesthesia - pt last ate/drank at 8 am - RBA of procedure discussed, questions answered - informed consent obtained and signed - case scheduled for 3 pm today at [facility] OR 8  2. No macular edema  on exam or OCT  3. History of retinal detachment OD - S/P SBP OD 11/26/14 -- Dr. Alan Mulder - looks great -- retina in great position -  monitor  4. Pseudophakia OU  - s/p CE/IOL OU (Bevis)  - doing well  - monitor   Ophthalmic Meds Ordered this visit:  No orders of the defined types were placed in this encounter.      Return in about 1 day (around 08/22/2018) for POV.  There are no Patient Instructions on file for this visit.   Explained the diagnoses, plan, and follow up with the patient and they expressed understanding.  Patient expressed understanding of the importance of proper follow up care.   This document serves as a record of services personally performed by Karie Chimera, MD, PhD. It was created on their behalf by Laurian Brim, OA, an ophthalmic assistant. The creation of this record is the provider's dictation and/or activities during the visit.    Electronically signed by: Laurian Brim, OA  10.07.19 11:27 AM   This document serves as a record of services personally performed by Karie Chimera, MD, PhD. It was created on their behalf by Virgilio Belling, COA, a certified ophthalmic assistant. The creation of this record is the provider's dictation and/or activities during the visit.  Electronically signed by: Virgilio Belling, COA  10.07.19 11:27 AM   Karie Chimera, M.D., Ph.D. Diseases & Surgery of the Retina and Vitreous [facility]   I have reviewed the above documentation for accuracy and completeness, and I agree with the above. Karie Chimera, M.D., Ph.D. 08/21/18 7:30 PM     Abbreviations: M myopia (nearsighted); A astigmatism; H hyperopia (farsighted); P presbyopia; Mrx spectacle prescription;  CTL contact lenses; OD right eye; OS left eye; OU both eyes  XT exotropia; ET esotropia; PEK punctate epithelial keratitis; PEE punctate epithelial erosions; DES dry eye syndrome; MGD meibomian gland dysfunction; ATs artificial tears; PFAT's preservative free artificial tears; NSC nuclear sclerotic cataract; PSC posterior subcapsular cataract; ERM epi-retinal membrane; PVD  posterior vitreous detachment; RD retinal detachment; DM diabetes mellitus; DR diabetic retinopathy; NPDR non-proliferative diabetic retinopathy; PDR proliferative diabetic retinopathy; CSME clinically significant macular edema; DME diabetic macular edema; dbh dot blot hemorrhages; CWS cotton wool spot; POAG primary open angle glaucoma; C/D cup-to-disc ratio; HVF humphrey visual field; GVF goldmann visual field; OCT optical coherence tomography; IOP intraocular pressure; BRVO Branch retinal vein occlusion; CRVO central retinal vein occlusion; CRAO central retinal artery occlusion; BRAO branch retinal artery occlusion; RT retinal tear; SB scleral buckle; PPV pars plana vitrectomy; VH Vitreous hemorrhage; PRP panretinal laser photocoagulation; IVK intravitreal kenalog; VMT vitreomacular traction; MH Macular hole;  NVD neovascularization of the disc; NVE neovascularization elsewhere; AREDS age related eye disease study; ARMD age related macular degeneration; POAG primary open angle glaucoma; EBMD epithelial/anterior basement membrane dystrophy; ACIOL anterior chamber intraocular lens; IOL intraocular lens; PCIOL posterior chamber intraocular lens; Phaco/IOL phacoemulsification with intraocular lens placement; PRK photorefractive keratectomy; LASIK laser assisted in situ keratomileusis; HTN hypertension; DM diabetes mellitus; COPD chronic obstructive pulmonary disease

## 2018-08-21 NOTE — Op Note (Signed)
Date of procedure: 10.07.19  Surgeon: Rennis Chris, MD, PhD  Assistant:Meredith Roxan Hockey, COA  Pre-operative Diagnosis: Rhegmatogenous Retinal Detachment, Left Eye  Post-operative diagnosis: Rhegmatogenous Retinal Detachment, LeftEye  Anesthesia: GETA  Procedures: 1) Scleral Buckle, Left Eye 2) 25gauge pars plana vitrectomy,Left Eye CPT [phone] 3) Perfluorocarbon injection 4)Fluid-air exchange, LeftEye 5) Endolaser, LeftEye 5) Injection of14%C3F8gas  Complications: none Estimated blood loss: minimal Specimens: none  Brief history: The patient has a history of decreased vision in the affected left eye, and on examination, was noted to have a macula-sparing, inferior retinal detachment, affecting activities of daily living.The risks, benefits, and alternatives were explained to the patient, including pain, bleeding, infection, loss of vision, double vision, droopy eyelids, and need for more surgeries.Informed consent was obtained from the patient and placed in the chart.   Procedure: The patient was brought to the preoperative holding area where the correct eye wasconfirmed and marked.The patient was then brought to the operating room where general endotracheal anesthesia was induced. A secondary time-out was performed to identify the correct patient, eyes, procedures, and any allergies.The left eye was prepped and draped in the usual sterile ophthalmic fashion followed by placement of a lid speculum. A 360 conjunctival peritomy was created using Westcott scissors and 0.12 forceps. Each of the four quadrants between the rectus muscles was dissected using Stevens scissors to detach Tenon's attachments from the globe. Each of the four rectus muscles was isolated on a muscle hook and slung using 2-0 Silk suture in the usual standard fashion. Each of the four quadrants between the rectus muscles was inspected and  there were noted to be no areas of scleral thinning. A #41 silicone band was then brought onto the field and was threaded under each rectus muscle. The band was thenlooselysecured using a #70 Watzke sleeve in the inferotemporalquadrant. The band was then sutured to the sclera in each quadrant using 5-0 nylon sutures passed partial thickness through the sclera in a horizontal mattress fashion. The scleral buckle was thentightened to the appropriate height with two locking needle drivers. Attention was then turned to the vitrectomy portion of the procedure. A 25gauge trocar was placed in the inferotemporal quadrant in a beveled fashion. A 4 mm infusion cannula was placed through this trocar, and the infusion cannula was confirmed in the vitreous cavity with no incarceration of retina or choroid prior to turning it on. Two additional 25gauge trocars were placed in the superonasal and superotemporal quadrants(2 and 10 oclock, respectively)in a similar beveled fashion. At this time,a standard three-port pars plana vitrectomy was performed using the light pipe, the cutter, and the BIOM viewing system. A thoroughanterior,core and peripheral vitreous dissection was performed. A posterior vitreous detachment was confirmed over the optic nerve.There was a inferior retinal detachment from 0300 to 0600sparing the macula with the posterior extent anterior to the inferotemporal arcades. There was a substantial horseshoe tear at 0500 and two small tears at 0300 within the detached retina. Of note, there was another small horseshoe tear at 1030--within attached retina Traction was removed from all retinal breaks. The breaks weretrimmed using the cutter to smooth the edges.Perfluoron was injected to push the subretinal fluid anterior to the scleral buckle. Each of the breaks settled nicely over the scleral buckle.A complete fluid-air exchange was performed with a soft tip extrusion cannula  over the 0300 and 0500 breaks, then posteriorly to remove the perfluoron. After completion of these maneuvers, the retina was flat over the macula and over the scleral buckle. Under air, endolaser  was applied to all the breaks and over and posterior to the scleral buckle. Of note, a small subretinal hemorrhage developed in the inferotemporal quadrant over the buckle in response to laser. Some of the blood was removed by extrusion cannula. Once hemostasis was achieved, additional laser was applied to retina in the inferotemporal quadrant At this time, the buckle height was confirmed and the buckle was finalized by trimming the band ends.The superonasal trocar wasremoved and sutured with 7-0 vicryl in an interrupted fashion.A complete air to14%C3F8gas exchange was performed through the infusion cannula and vented through the superotemporal trocar using the extrusion cannula.Thesuperonasal trocar andinfusion cannula and associated trocar werethen removed and sutured with 7-0 vicryl in an interrupted fashion. Kefzol+ polymixinirrigation wasthenused over the buckle. A subtenon's block containing 0.75% marcaine and 2% lidocaine was administered. The conjunctiva was closed with 7-0 vicryl sutures. The eye's intraocular pressurewas confirmed to be at a physiologic level by digital palpation. Subconjunctival injections of Antibiotic and kenalogwere administered. The lid speculum and drapes were removed. Drops of an antibiotic, antihypertensives, and steroid were given. Copious antibiotic ointment was instilled into the eye. The eye was patched and shielded. The patient tolerated the procedure well without any intraoperative or immediate postoperative complications. The patient was taken to the recovery room in good condition. The patient was instructed to maintain a strict face-down position andwill be seen by Dr. Johnnette Litter in clinic.

## 2018-08-22 ENCOUNTER — Encounter (INDEPENDENT_AMBULATORY_CARE_PROVIDER_SITE_OTHER): Payer: Self-pay | Admitting: Ophthalmology

## 2018-08-22 ENCOUNTER — Ambulatory Visit (INDEPENDENT_AMBULATORY_CARE_PROVIDER_SITE_OTHER): Payer: Medicare Other | Admitting: Ophthalmology

## 2018-08-22 DIAGNOSIS — Z8669 Personal history of other diseases of the nervous system and sense organs: Secondary | ICD-10-CM

## 2018-08-22 DIAGNOSIS — Z961 Presence of intraocular lens: Secondary | ICD-10-CM

## 2018-08-22 DIAGNOSIS — H3322 Serous retinal detachment, left eye: Secondary | ICD-10-CM

## 2018-08-22 DIAGNOSIS — H3581 Retinal edema: Secondary | ICD-10-CM

## 2018-08-22 NOTE — Progress Notes (Addendum)
[facility] - Clinic Note  08/22/2018     CHIEF COMPLAINT Patient presents for Post-op Follow-up   HISTORY OF PRESENT ILLNESS: Carla May is a 77 y.o. female who presents to the clinic today for:   HPI    Post-op Follow-up    In left eye.  Discomfort includes none.  Negative for pain, itching, foreign body sensation, tearing, discharge and floaters.  Vision is stable.  I, the attending physician,  performed the HPI with the patient and updated documentation appropriately.          Comments    Pt presents for POV1 (s/p SBP + PPV/PFC/EL/FAX/14% C3F8 10.07.19), pt states she did well after sx yesterday, she states she slept well and has not had any pain, pt is maintaining head down position most of the time as instructed,        Last edited by Rennis Chris, MD on 08/22/2018  8:47 AM. (History)      Referring physician: Maurice Small, MD [address] [place], [place] [address]  HISTORICAL INFORMATION:   Selected notes from the MEDICAL RECORD NUMBER Referred by Dr. Ashok Cordia for concern of retinal hole LEE: Josefa Half) [BCVA: OD: OS:] Ocular Hx- PMH-anxiety, arthritis, HLD    CURRENT MEDICATIONS: Current Outpatient Medications (Ophthalmic Drugs)  Medication Sig  . bacitracin-polymyxin b (POLYSPORIN) ophthalmic ointment Place 1 application into the right eye 3 (three) times daily. apply to eye every 8 hours while awake (Patient not taking: Reported on 08/21/2018)  . brimonidine (ALPHAGAN) 0.2 % ophthalmic solution Place 1 drop into the right eye 2 (two) times daily. (Patient not taking: Reported on 08/21/2018)  . carboxymethylcellulose (REFRESH PLUS) 0.5 % SOLN Place 1 drop into both eyes daily as needed (dry eyes).  Marland Kitchen gatifloxacin (ZYMAXID) 0.5 % SOLN Place 1 drop into the right eye 4 (four) times daily. (Patient not taking: Reported on 08/21/2018)  . latanoprost (XALATAN) 0.005 % ophthalmic solution Place 1 drop into the right eye  at bedtime. (Patient not taking: Reported on 08/21/2018)  . prednisoLONE acetate (PRED FORTE) 1 % ophthalmic suspension Place 1 drop into the right eye 4 (four) times daily. (Patient not taking: Reported on 08/21/2018)   No current facility-administered medications for this visit.  (Ophthalmic Drugs)   Current Outpatient Medications (Other)  Medication Sig  . acetaminophen (TYLENOL ARTHRITIS PAIN) 650 MG CR tablet Take 1,300 mg by mouth every 8 (eight) hours as needed for pain.  Marland Kitchen aspirin EC 81 MG tablet Take 81 mg by mouth daily.  Marland Kitchen HYDROcodone-acetaminophen (NORCO/VICODIN) 5-325 MG tablet Take 1 tablet by mouth every 4 (four) hours as needed for moderate pain.  . Multiple Vitamins-Minerals (MULTIVITAMIN PO) Take 1 tablet by mouth daily.  . Omega-3 Fatty Acids (FISH OIL) 1000 MG CAPS Take 1,000 mg by mouth daily.  Marland Kitchen omeprazole (PRILOSEC OTC) 20 MG tablet Take 20 mg by mouth daily.  . Pyridoxine HCl (VITAMIN B-6) 250 MG tablet Take 250 mg by mouth daily.   No current facility-administered medications for this visit.  (Other)      REVIEW OF SYSTEMS: ROS    Positive for: Eyes   Negative for: Constitutional, Gastrointestinal, Neurological, Skin, Genitourinary, Musculoskeletal, HENT, Endocrine, Cardiovascular, Respiratory, Psychiatric, Allergic/Imm, Heme/Lymph   Last edited by Posey Boyer, COT on 08/22/2018  8:32 AM. (History)       ALLERGIES Allergies  Allergen Reactions  . Atorvastatin Other (See Comments)    Muscle aches/ memory loss  .  Penicillins Itching and Nausea Only  . Sulfa Antibiotics Nausea Only    PAST MEDICAL HISTORY Past Medical History:  Diagnosis Date  . Anxiety   . Arthritis   . GERD (gastroesophageal reflux disease)   . History of palpitations   . Hyperlipidemia   . MVP (mitral valve prolapse)    Past Surgical History:  Procedure Laterality Date  . ABDOMINAL HYSTERECTOMY  1970's  . CATARACT EXTRACTION    . EYE SURGERY Bilateral   . GAS INSERTION  Right 11/26/2014   Procedure: INSERTION OF GAS;  Surgeon: Sherrie George, MD;  Location: [facility] OR;  Service: Ophthalmology;  Laterality: Right;  C3F8  . GAS/FLUID EXCHANGE Left 08/21/2018   Procedure: GAS/FLUID EXCHANGE;  Surgeon: Rennis Chris, MD;  Location: [facility] OR;  Service: Ophthalmology;  Laterality: Left;  C3F8  . KNEE ARTHROSCOPY Left   . PHOTOCOAGULATION WITH LASER Right 11/26/2014   Procedure: PHOTOCOAGULATION WITH LASER;  Surgeon: Sherrie George, MD;  Location: [facility] OR;  Service: Ophthalmology;  Laterality: Right;  . PHOTOCOAGULATION WITH LASER Left 08/21/2018   Procedure: PHOTOCOAGULATION WITH LASER;  Surgeon: Rennis Chris, MD;  Location: [facility] OR;  Service: Ophthalmology;  Laterality: Left;  . RETINAL DETACHMENT SURGERY    . SCLERAL BUCKLE Right 11/26/2014   Procedure: SCLERAL BUCKLE RIGHT EYE ;  Surgeon: Sherrie George, MD;  Location: [facility] OR;  Service: Ophthalmology;  Laterality: Right;  . SCLERAL BUCKLE WITH POSSIBLE 25 GAUGE PARS PLANA VITRECTOMY Left 08/21/2018   Procedure: SCLERAL BUCKLE WITH 25 GAUGE PARS PLANA VITRECTOMY;  Surgeon: Rennis Chris, MD;  Location: [facility] OR;  Service: Ophthalmology;  Laterality: Left;    FAMILY HISTORY Family History  Problem Relation Age of Onset  . Cancer Mother   . Heart failure Father     SOCIAL HISTORY Social History   Tobacco Use  . Smoking status: Former Smoker    Years: 10.00  . Smokeless tobacco: Never Used  . Tobacco comment: quit in 1970's  Substance Use Topics  . Alcohol use: No  . Drug use: No         OPHTHALMIC EXAM:  Base Eye Exam    Visual Acuity (Snellen - Linear)      Right Left   Dist cc 20/150 HM   Dist ph cc 20/50 +2 NI   Correction:  Glasses       Tonometry (Tonopen, 8:46 AM)      Right Left   Pressure 13 24       Pupils      Dark Light Shape React APD   Right 4 2 Round Slow None   Left 6 6 Round NR None       Visual Fields      Left Right     Full   Restrictions Total superior temporal, inferior  temporal, superior nasal, inferior nasal deficiencies        Extraocular Movement      Right Left    Full, Ortho Full, Ortho       Neuro/Psych    Oriented x3:  Yes   Mood/Affect:  Normal       Dilation    Left eye:  1.0% Mydriacyl, 2.5% Phenylephrine @ 8:45 AM        Slit Lamp and Fundus Exam    External Exam      Right Left   External  Periorbital edema       Slit Lamp Exam      Right Left  Lids/Lashes Dermatochalasis - upper lid, Meibomian gland dysfunction, Telangiectasia Dermatochalasis - upper lid, Meibomian gland dysfunction, Telangiectasia, Ecchymosis nasal upper lid   Conjunctiva/Sclera post surgical changes, healed nicely 360 Subconjunctival hemorrhage, sutures intact   Cornea Debris in tear film, 2+ Punctate epithelial erosions, Temporal Well healed cataract wounds 3+ Punctate epithelial erosions, mild edema   Anterior Chamber Deep and quiet Deep, 3+ cell, mild fibrin reaction   Iris Round and dilated Round and dilated   Lens PC IOL in good position, trace PCO PC IOL in good position   Vitreous Vitreous syneresis, mild pigment in anterior vitreous, Posterior vitreous detachment Post vitrectomy, good gas fill       Fundus Exam      Right Left   Disc  Compact, mildly Tilted disc, Peripapillary atrophy   C/D Ratio 0.2 0.3   Macula  Flat under gas   Vessels  Vascular attenuation   Periphery  Inferior retinal detachment from 3-6 oclock; horseshoe tear at 0500, two small holes at 0300; retina attached with good buckle height, retina attached over buckle, good laser surrounding buckle, mild subretinal heme IT quad just posteior to buckle          IMAGING AND PROCEDURES  Imaging and Procedures for @TODAY @           ASSESSMENT/PLAN:    ICD-10-CM   1. 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 Retinal edema H35.81   3. History of retinal detachment Z86.69   4. 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 Retinal detachment, OS - macula-sparing inferior retinal  detachment from 3-6 oclock - large HST at 0500 and 2 small tears at 300 within the detached retina - POD1 s/p SBP + PPV/PFC/EL/FAX/14% C3F8 OS, 10.07.19             - doing well this morning             - retina attached and in good position -- good buckle height and laser around breaks  - mild subretinal heme inf temporal quadrant  - 3+ cell and mild fibrin reaction in AC             - IOP mildly elevated(24 OS)             - start   PF 6x/day OS                         zymaxid QID OS                         Atropine BID OS   Brimonidine BID OS                         Cosopt BID OS                         PSO ung QID OS             - cont face down positioning x3 days; avoid laying flat on back             - eye shield when sleeping             - post op drop and positioning instructions reviewed             - tylenol/ibuprofen for pain             -  Rx given for breakthrough pain  - f/u 1 week  2. No retinal edema on exam or OCT  3. History of retinal detachment OD - S/P SBP OD 11/26/14 -- Dr. Alan Mulder - looks great -- retina in great position - monitor  4. Pseudophakia OU  - s/p CE/IOL OU (Bevis)  - doing well  - monitor   Ophthalmic Meds Ordered this visit:  No orders of the defined types were placed in this encounter.      Return in about 1 week (around 08/29/2018) for POV 2.  There are no Patient Instructions on file for this visit.   Explained the diagnoses, plan, and follow up with the patient and they expressed understanding.  Patient expressed understanding of the importance of proper follow up care.   This document serves as a record of services personally performed by Karie Chimera, MD, PhD. It was created on their behalf by Virgilio Belling, COA, a certified ophthalmic assistant. The creation of this record is the provider's dictation and/or activities during the visit.  Electronically signed by: Virgilio Belling, COA  10.08.19 4:14  PM   Karie Chimera, M.D., Ph.D. Diseases & Surgery of the Retina and Vitreous [facility]   I have reviewed the above documentation for accuracy and completeness, and I agree with the above. Karie Chimera, M.D., Ph.D. 08/23/18 4:14 PM   Abbreviations: M myopia (nearsighted); A astigmatism; H hyperopia (farsighted); P presbyopia; Mrx spectacle prescription;  CTL contact lenses; OD right eye; OS left eye; OU both eyes  XT exotropia; ET esotropia; PEK punctate epithelial keratitis; PEE punctate epithelial erosions; DES dry eye syndrome; MGD meibomian gland dysfunction; ATs artificial tears; PFAT's preservative free artificial tears; NSC nuclear sclerotic cataract; PSC posterior subcapsular cataract; ERM epi-retinal membrane; PVD posterior vitreous detachment; RD retinal detachment; DM diabetes mellitus; DR diabetic retinopathy; NPDR non-proliferative diabetic retinopathy; PDR proliferative diabetic retinopathy; CSME clinically significant macular edema; DME diabetic macular edema; dbh dot blot hemorrhages; CWS cotton wool spot; POAG primary open angle glaucoma; C/D cup-to-disc ratio; HVF humphrey visual field; GVF goldmann visual field; OCT optical coherence tomography; IOP intraocular pressure; BRVO Branch retinal vein occlusion; CRVO central retinal vein occlusion; CRAO central retinal artery occlusion; BRAO branch retinal artery occlusion; RT retinal tear; SB scleral buckle; PPV pars plana vitrectomy; VH Vitreous hemorrhage; PRP panretinal laser photocoagulation; IVK intravitreal kenalog; VMT vitreomacular traction; MH Macular hole;  NVD neovascularization of the disc; NVE neovascularization elsewhere; AREDS age related eye disease study; ARMD age related macular degeneration; POAG primary open angle glaucoma; EBMD epithelial/anterior basement membrane dystrophy; ACIOL anterior chamber intraocular lens; IOL intraocular lens; PCIOL posterior chamber intraocular lens; Phaco/IOL  phacoemulsification with intraocular lens placement; PRK photorefractive keratectomy; LASIK laser assisted in situ keratomileusis; HTN hypertension; DM diabetes mellitus; COPD chronic obstructive pulmonary disease

## 2018-08-22 NOTE — Anesthesia Postprocedure Evaluation (Signed)
Anesthesia Post Note  Patient: Carla May  Procedure(s) Performed: SCLERAL BUCKLE WITH 25 GAUGE PARS PLANA VITRECTOMY (Left Eye) PHOTOCOAGULATION WITH LASER (Left Eye) GAS/FLUID EXCHANGE (Left Eye)     Patient location during evaluation: PACU Anesthesia Type: General Level of consciousness: awake and alert Pain management: pain level controlled Vital Signs Assessment: post-procedure vital signs reviewed and stable Respiratory status: spontaneous breathing, nonlabored ventilation and respiratory function stable Cardiovascular status: blood pressure returned to baseline and stable Postop Assessment: no apparent nausea or vomiting Anesthetic complications: no    Last Vitals:  Vitals:   08/21/18 2040 08/21/18 2100  BP: (!) 151/77 (!) 158/88  Pulse: 88 82  Resp: 13 15  Temp: (!) 36.4 C   SpO2: 96% 97%    Last Pain:  Vitals:   08/21/18 2100  TempSrc:   PainSc: 5                  Beryle Lathe

## 2018-08-23 ENCOUNTER — Encounter (INDEPENDENT_AMBULATORY_CARE_PROVIDER_SITE_OTHER): Payer: Self-pay | Admitting: Ophthalmology

## 2018-08-23 ENCOUNTER — Telehealth (INDEPENDENT_AMBULATORY_CARE_PROVIDER_SITE_OTHER): Payer: Self-pay

## 2018-08-23 MED ORDER — BACITRACIN-POLYMYXIN B 500-10000 UNIT/GM OP OINT: 1.0000 "application " | TOPICAL_OINTMENT | Freq: Four times a day (QID) | OPHTHALMIC | 1 refills | Status: DC

## 2018-08-23 MED ORDER — GATIFLOXACIN 0.5 % OP SOLN: 1.0000 [drp] | Freq: Four times a day (QID) | OPHTHALMIC | 0 refills | Status: DC

## 2018-08-23 NOTE — Telephone Encounter (Signed)
Pt called asking if she was able to "get her eye wet and wash her hair"; Called pt back and advised her I t is okay tt get eye wet but to be very careful; Also advised her she is able to wash her hair but she is not to lean back or look up; Pt expressed understanding;   Virgilio Belling, COA

## 2018-08-23 NOTE — Telephone Encounter (Signed)
Pt called stating that she needs a refill of gatifloxacin and PSO ung; Pt states she does not feel that she has enough gtts or ung to "make it to my next appointment"; Will send gatifloxacin and PSO ung in to [facility] on [address], both to be used OS QID;   Virgilio Belling, COA

## 2018-08-28 NOTE — Progress Notes (Signed)
[facility] - Clinic Note  08/29/2018     CHIEF COMPLAINT Patient presents for Post-op Follow-up   HISTORY OF PRESENT ILLNESS: Carla May is a 77 y.o. female who presents to the clinic today for:   HPI    Post-op Follow-up    In left eye.  Discomfort includes none.  Negative for pain, itching, foreign body sensation, tearing, discharge and floaters.  Vision is stable.  I, the attending physician,  performed the HPI with the patient and updated documentation appropriately.          Comments    S/P SBP + PPV/PFC/EL/FAX/14% C3F8 OS (10.07.19); Pt states OS is "getting better slowly"; Pt states OS is comfortable; Pt denies floaters, denies flashes;  Pt reports using PF OS 6x daily, zymaxid OS QID, atropine OS BID, brimonidine OS, BID, cosopt OS BID, and PSO ung OS QID as directed;        Last edited by Rennis Chris, MD on 08/29/2018 10:24 AM. (History)      Referring physician: Maurice Small, MD [address] [place], [place] [address]  HISTORICAL INFORMATION:   Selected notes from the MEDICAL RECORD NUMBER Referred by Dr. Ashok Cordia for concern of retinal hole LEE: Josefa Half) [BCVA: OD: OS:] Ocular Hx- PMH-anxiety, arthritis, HLD    CURRENT MEDICATIONS: Current Outpatient Medications (Ophthalmic Drugs)  Medication Sig  . bacitracin-polymyxin b (POLYSPORIN) ophthalmic ointment Place 1 application into the left eye 4 (four) times daily. Apply 1/4 inch ribbon to left eye four times a day  . brimonidine (ALPHAGAN) 0.2 % ophthalmic solution Place 1 drop into the right eye 2 (two) times daily. (Patient not taking: Reported on 08/21/2018)  . carboxymethylcellulose (REFRESH PLUS) 0.5 % SOLN Place 1 drop into both eyes daily as needed (dry eyes).  Marland Kitchen gatifloxacin (ZYMAXID) 0.5 % SOLN Place 1 drop into the left eye 4 (four) times daily.  Marland Kitchen latanoprost (XALATAN) 0.005 % ophthalmic solution Place 1 drop into the right eye at bedtime.  (Patient not taking: Reported on 08/21/2018)  . prednisoLONE acetate (PRED FORTE) 1 % ophthalmic suspension Place 1 drop into the right eye 4 (four) times daily. (Patient not taking: Reported on 08/21/2018)  . prednisoLONE acetate (PRED FORTE) 1 % ophthalmic suspension Place 1 drop into the left eye 6 (six) times daily.   No current facility-administered medications for this visit.  (Ophthalmic Drugs)   Current Outpatient Medications (Other)  Medication Sig  . acetaminophen (TYLENOL ARTHRITIS PAIN) 650 MG CR tablet Take 1,300 mg by mouth every 8 (eight) hours as needed for pain.  Marland Kitchen aspirin EC 81 MG tablet Take 81 mg by mouth daily.  Marland Kitchen HYDROcodone-acetaminophen (NORCO/VICODIN) 5-325 MG tablet Take 1 tablet by mouth every 4 (four) hours as needed for moderate pain.  . Multiple Vitamins-Minerals (MULTIVITAMIN PO) Take 1 tablet by mouth daily.  . Omega-3 Fatty Acids (FISH OIL) 1000 MG CAPS Take 1,000 mg by mouth daily.  Marland Kitchen omeprazole (PRILOSEC OTC) 20 MG tablet Take 20 mg by mouth daily.  . Pyridoxine HCl (VITAMIN B-6) 250 MG tablet Take 250 mg by mouth daily.   No current facility-administered medications for this visit.  (Other)      REVIEW OF SYSTEMS: ROS    Positive for: Eyes   Negative for: Constitutional, Gastrointestinal, Neurological, Skin, Genitourinary, Musculoskeletal, HENT, Endocrine, Cardiovascular, Respiratory, Psychiatric, Allergic/Imm, Heme/Lymph   Last edited by Concepcion Elk, COA on 08/29/2018 10:14 AM. (History)  ALLERGIES Allergies  Allergen Reactions  . Atorvastatin Other (See Comments)    Muscle aches/ memory loss  . Penicillins Itching and Nausea Only  . Sulfa Antibiotics Nausea Only    PAST MEDICAL HISTORY Past Medical History:  Diagnosis Date  . Anxiety   . Arthritis   . GERD (gastroesophageal reflux disease)   . History of palpitations   . Hyperlipidemia   . MVP (mitral valve prolapse)    Past Surgical History:  Procedure Laterality Date   . ABDOMINAL HYSTERECTOMY  1970's  . CATARACT EXTRACTION    . EYE SURGERY Bilateral   . GAS INSERTION Right 11/26/2014   Procedure: INSERTION OF GAS;  Surgeon: Sherrie George, MD;  Location: [facility] OR;  Service: Ophthalmology;  Laterality: Right;  C3F8  . GAS/FLUID EXCHANGE Left 08/21/2018   Procedure: GAS/FLUID EXCHANGE;  Surgeon: Rennis Chris, MD;  Location: [facility] OR;  Service: Ophthalmology;  Laterality: Left;  C3F8  . KNEE ARTHROSCOPY Left   . PHOTOCOAGULATION WITH LASER Right 11/26/2014   Procedure: PHOTOCOAGULATION WITH LASER;  Surgeon: Sherrie George, MD;  Location: [facility] OR;  Service: Ophthalmology;  Laterality: Right;  . PHOTOCOAGULATION WITH LASER Left 08/21/2018   Procedure: PHOTOCOAGULATION WITH LASER;  Surgeon: Rennis Chris, MD;  Location: [facility] OR;  Service: Ophthalmology;  Laterality: Left;  . RETINAL DETACHMENT SURGERY    . SCLERAL BUCKLE Right 11/26/2014   Procedure: SCLERAL BUCKLE RIGHT EYE ;  Surgeon: Sherrie George, MD;  Location: [facility] OR;  Service: Ophthalmology;  Laterality: Right;  . SCLERAL BUCKLE WITH POSSIBLE 25 GAUGE PARS PLANA VITRECTOMY Left 08/21/2018   Procedure: SCLERAL BUCKLE WITH 25 GAUGE PARS PLANA VITRECTOMY;  Surgeon: Rennis Chris, MD;  Location: [facility] OR;  Service: Ophthalmology;  Laterality: Left;    FAMILY HISTORY Family History  Problem Relation Age of Onset  . Cancer Mother   . Heart failure Father     SOCIAL HISTORY Social History   Tobacco Use  . Smoking status: Former Smoker    Years: 10.00  . Smokeless tobacco: Never Used  . Tobacco comment: quit in 1970's  Substance Use Topics  . Alcohol use: No  . Drug use: No         OPHTHALMIC EXAM:  Base Eye Exam    Visual Acuity (Snellen - Linear)      Right Left   Dist cc 20/60 CF at face   Dist ph cc 20/40 NI   Correction:  Glasses       Tonometry (Tonopen, 10:22 AM)      Right Left   Pressure 14 13       Pupils      Dark Shape React APD   Right 4 Round Minimal None   Left 5 Round NR  None       Neuro/Psych    Oriented x3:  Yes   Mood/Affect:  Normal       Dilation    Left eye:  1.0% Mydriacyl, 2.5% Phenylephrine @ 10:22 AM        Slit Lamp and Fundus Exam    External Exam      Right Left   External  Periorbital edema and ecchymoses -- improved       Slit Lamp Exam      Right Left   Lids/Lashes Dermatochalasis - upper lid, Meibomian gland dysfunction, Telangiectasia Dermatochalasis - upper lid, Meibomian gland dysfunction, Telangiectasia, trace Ecchymosis nasal upper lid   Conjunctiva/Sclera post surgical changes, healed nicely 360 Subconjunctival hemorrhage --  improved, sutures intact   Cornea Debris in tear film, 2+ Punctate epithelial erosions, Temporal Well healed cataract wounds 3+ Punctate epithelial erosions, mild edema   Anterior Chamber Deep and quiet Mod depth, 2-3+ cell, mild fibrin reaction resolved   Iris Round and dilated Round and moderately dilated   Lens PC IOL in good position, trace PCO PC IOL in good position   Vitreous Vitreous syneresis, mild pigment in anterior vitreous, Posterior vitreous detachment Post vitrectomy, ~95% single gas bubble       Fundus Exam      Right Left   Disc  Compact, mildly Tilted disc, Peripapillary atrophy   C/D Ratio 0.2 0.3   Macula  Flat under gas   Vessels  Vascular attenuation   Periphery  Inferior retinal detachment from 3-6 oclock; horseshoe tear at 0500, two small holes at 0300; retina attached with good buckle height, retina attached over buckle, good laser surrounding buckle, mild subretinal heme IT quad just posteior to buckle -- improving          IMAGING AND PROCEDURES  Imaging and Procedures for @TODAY @           ASSESSMENT/PLAN:    ICD-10-CM   1. 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 Retinal edema H35.81   3. History of retinal detachment Z86.69   4. 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 Retinal detachment, OS - macula-sparing inferior retinal detachment from 3-6 oclock -  large HST at 0500 and 2 small tears at 300 within the detached retina - POD8 s/p SBP + PPV/PFC/EL/FAX/14% C3F8 OS, 10.07.19             - did well this week             - retina attached and in good position -- good buckle height and laser around breaks  - mild subretinal heme inf temporal quadrant improving  - 2-3+ cell in AC -- fibrin resolved             - IOP good today - 14 mmHg             - cont   PF 6x/day OS                         zymaxid QID OS-- STOP when bottle runs out                         Atropine BID OS                         Cosopt BID OS                         PSO ung QID OS  - d/c brimonidine             - cont face down positioning half time; avoid laying flat on back             - eye shield when sleepingx1 more wk             - post op drop and positioning instructions reviewed             - tylenol/ibuprofen for pain             - Rx given for breakthrough pain  - f/u 3 weeks  2. No retinal edema  on exam or OCT  3. History of retinal detachment OD - S/P SBP OD 11/26/14 -- Dr. Alan Mulder - looks great -- retina in great position - monitor  4. Pseudophakia OU  - s/p CE/IOL OU ([facility]is)  - doing well  - monitor   Ophthalmic Meds Ordered this visit:  Meds ordered this encounter  Medications  . prednisoLONE acetate (PRED FORTE) 1 % ophthalmic suspension    Sig: Place 1 drop into the left eye 6 (six) times daily.    Dispense:  15 mL    Refill:  0       Return in about 3 weeks (around 09/19/2018) for POV 3.  There are no Patient Instructions on file for this visit.   Explained the diagnoses, plan, and follow up with the patient and they expressed understanding.  Patient expressed understanding of the importance of proper follow up care.   This document serves as a record of services personally performed by Karie Chimera, MD, PhD. It was created on their behalf by Virgilio Belling, COA, a certified ophthalmic assistant. The creation of  this record is the provider's dictation and/or activities during the visit.  Electronically signed by: Virgilio Belling, COA  10.14.19 11:30 AM   Karie Chimera, M.D., Ph.D. Diseases & Surgery of the Retina and Vitreous [facility]  I have reviewed the above documentation for accuracy and completeness, and I agree with the above. Karie Chimera, M.D., Ph.D. 08/29/18 11:30 AM   Abbreviations: M myopia (nearsighted); A astigmatism; H hyperopia (farsighted); P presbyopia; Mrx spectacle prescription;  CTL contact lenses; OD right eye; OS left eye; OU both eyes  XT exotropia; ET esotropia; PEK punctate epithelial keratitis; PEE punctate epithelial erosions; DES dry eye syndrome; MGD meibomian gland dysfunction; ATs artificial tears; PFAT's preservative free artificial tears; NSC nuclear sclerotic cataract; PSC posterior subcapsular cataract; ERM epi-retinal membrane; PVD posterior vitreous detachment; RD retinal detachment; DM diabetes mellitus; DR diabetic retinopathy; NPDR non-proliferative diabetic retinopathy; PDR proliferative diabetic retinopathy; CSME clinically significant macular edema; DME diabetic macular edema; dbh dot blot hemorrhages; CWS cotton wool spot; POAG primary open angle glaucoma; C/D cup-to-disc ratio; HVF humphrey visual field; GVF goldmann visual field; OCT optical coherence tomography; IOP intraocular pressure; BRVO Branch retinal vein occlusion; CRVO central retinal vein occlusion; CRAO central retinal artery occlusion; BRAO branch retinal artery occlusion; RT retinal tear; SB scleral buckle; PPV pars plana vitrectomy; VH Vitreous hemorrhage; PRP panretinal laser photocoagulation; IVK intravitreal kenalog; VMT vitreomacular traction; MH Macular hole;  NVD neovascularization of the disc; NVE neovascularization elsewhere; AREDS age related eye disease study; ARMD age related macular degeneration; POAG primary open angle glaucoma; EBMD epithelial/anterior  basement membrane dystrophy; ACIOL anterior chamber intraocular lens; IOL intraocular lens; PCIOL posterior chamber intraocular lens; Phaco/IOL phacoemulsification with intraocular lens placement; PRK photorefractive keratectomy; LASIK laser assisted in situ keratomileusis; HTN hypertension; DM diabetes mellitus; COPD chronic obstructive pulmonary disease

## 2018-08-29 ENCOUNTER — Ambulatory Visit (INDEPENDENT_AMBULATORY_CARE_PROVIDER_SITE_OTHER): Payer: Medicare Other | Admitting: Ophthalmology

## 2018-08-29 ENCOUNTER — Other Ambulatory Visit (INDEPENDENT_AMBULATORY_CARE_PROVIDER_SITE_OTHER): Payer: Self-pay

## 2018-08-29 ENCOUNTER — Encounter (INDEPENDENT_AMBULATORY_CARE_PROVIDER_SITE_OTHER): Payer: Self-pay | Admitting: Ophthalmology

## 2018-08-29 ENCOUNTER — Telehealth (INDEPENDENT_AMBULATORY_CARE_PROVIDER_SITE_OTHER): Payer: Self-pay

## 2018-08-29 DIAGNOSIS — H3322 Serous retinal detachment, left eye: Secondary | ICD-10-CM

## 2018-08-29 DIAGNOSIS — H3581 Retinal edema: Secondary | ICD-10-CM

## 2018-08-29 DIAGNOSIS — Z8669 Personal history of other diseases of the nervous system and sense organs: Secondary | ICD-10-CM

## 2018-08-29 DIAGNOSIS — Z961 Presence of intraocular lens: Secondary | ICD-10-CM

## 2018-08-29 MED ORDER — PREDNISOLONE ACETATE 1 % OP SUSP: 1.0000 [drp] | Freq: Every day | OPHTHALMIC | 0 refills | Status: DC

## 2018-08-29 MED ORDER — BACITRACIN-POLYMYXIN B 500-10000 UNIT/GM OP OINT: 1.0000 "application " | TOPICAL_OINTMENT | Freq: Four times a day (QID) | OPHTHALMIC | 1 refills | Status: DC

## 2018-08-29 NOTE — Telephone Encounter (Signed)
Pt called requesting refill on atropine if she is to continue it; Spoke to Dr. Vanessa Barbara, he states pt is able to discontinue atropine once bottle runs out; Relayed information to pt; Pt expressed understanding;   Virgilio Belling, COA

## 2018-09-18 NOTE — Progress Notes (Signed)
[facility] - Clinic Note  09/19/2018     CHIEF COMPLAINT Patient presents for Post-op Follow-up   HISTORY OF PRESENT ILLNESS: Carla May is a 77 y.o. female who presents to the clinic today for:   HPI    Post-op Follow-up    In left eye.  Discomfort includes pain, tearing and floaters.  Negative for itching, foreign body sensation and discharge.  Vision is improved.  I, the attending physician,  performed the HPI with the patient and updated documentation appropriately.          Comments    POV S/P PPV/PF/EL/Fax 14 % C3F8 OS 910/7/19). Patient states the bubble is lower quarter of her vision OS , vision is gradually improving per patient.Patient has occasional sharpe pains, tearing, floaters and  double VA OS.Pt is using CoSopt Bid os, PF 6x qd OS. Pt is taking vit's QD        Last edited by Rennis Chris, MD on 09/19/2018  1:02 PM. (History)    pt states that her vision is starting to come back and she can now see over the gas bubble, pt states she is maintaining face down position 3-4 hours a day, still sleeping on right side  Referring physician: Maurice Small, MD [address] [place], [place] [address]  HISTORICAL INFORMATION:   Selected notes from the MEDICAL RECORD NUMBER Referred by Dr. Ashok Cordia for concern of retinal hole LEE: Josefa Half) [BCVA: OD: OS:] Ocular Hx- PMH-anxiety, arthritis, HLD    CURRENT MEDICATIONS: Current Outpatient Medications (Ophthalmic Drugs)  Medication Sig  . carboxymethylcellulose (REFRESH PLUS) 0.5 % SOLN Place 1 drop into both eyes daily as needed (dry eyes).  . prednisoLONE acetate (PRED FORTE) 1 % ophthalmic suspension Place 1 drop into the right eye 4 (four) times daily.  . prednisoLONE acetate (PRED FORTE) 1 % ophthalmic suspension Place 1 drop into the left eye 6 (six) times daily.  . bacitracin-polymyxin b (POLYSPORIN) ophthalmic ointment Place 1 application into the left eye 4 (four)  times daily. Apply 1/4 inch ribbon to left eye four times a day (Patient not taking: Reported on 09/19/2018)  . brimonidine (ALPHAGAN) 0.2 % ophthalmic solution Place 1 drop into the right eye 2 (two) times daily. (Patient not taking: Reported on 09/19/2018)  . gatifloxacin (ZYMAXID) 0.5 % SOLN Place 1 drop into the left eye 4 (four) times daily. (Patient not taking: Reported on 09/19/2018)  . latanoprost (XALATAN) 0.005 % ophthalmic solution Place 1 drop into the right eye at bedtime. (Patient not taking: Reported on 09/19/2018)   No current facility-administered medications for this visit.  (Ophthalmic Drugs)   Current Outpatient Medications (Other)  Medication Sig  . acetaminophen (TYLENOL ARTHRITIS PAIN) 650 MG CR tablet Take 1,300 mg by mouth every 8 (eight) hours as needed for pain.  Marland Kitchen aspirin EC 81 MG tablet Take 81 mg by mouth daily.  Marland Kitchen HYDROcodone-acetaminophen (NORCO/VICODIN) 5-325 MG tablet Take 1 tablet by mouth every 4 (four) hours as needed for moderate pain.  . Multiple Vitamins-Minerals (MULTIVITAMIN PO) Take 1 tablet by mouth daily.  . Omega-3 Fatty Acids (FISH OIL) 1000 MG CAPS Take 1,000 mg by mouth daily.  Marland Kitchen omeprazole (PRILOSEC OTC) 20 MG tablet Take 20 mg by mouth daily.  . Pyridoxine HCl (VITAMIN B-6) 250 MG tablet Take 250 mg by mouth daily.   No current facility-administered medications for this visit.  (Other)      REVIEW OF SYSTEMS: ROS  Positive for: Eyes   Negative for: Constitutional, Gastrointestinal, Neurological, Skin, Genitourinary, Musculoskeletal, HENT, Endocrine, Cardiovascular, Respiratory, Psychiatric, Allergic/Imm, Heme/Lymph   Last edited by Eldridge Scot, LPN on 16/11/958  9:58 AM. (History)       ALLERGIES Allergies  Allergen Reactions  . Atorvastatin Other (See Comments)    Muscle aches/ memory loss  . Penicillins Itching and Nausea Only  . Sulfa Antibiotics Nausea Only    PAST MEDICAL HISTORY Past Medical History:  Diagnosis Date   . Anxiety   . Arthritis   . GERD (gastroesophageal reflux disease)   . History of palpitations   . Hyperlipidemia   . MVP (mitral valve prolapse)    Past Surgical History:  Procedure Laterality Date  . ABDOMINAL HYSTERECTOMY  1970's  . CATARACT EXTRACTION    . EYE SURGERY Bilateral   . GAS INSERTION Right 11/26/2014   Procedure: INSERTION OF GAS;  Surgeon: Sherrie George, MD;  Location: [facility] OR;  Service: Ophthalmology;  Laterality: Right;  C3F8  . GAS/FLUID EXCHANGE Left 08/21/2018   Procedure: GAS/FLUID EXCHANGE;  Surgeon: Rennis Chris, MD;  Location: [facility] OR;  Service: Ophthalmology;  Laterality: Left;  C3F8  . KNEE ARTHROSCOPY Left   . PHOTOCOAGULATION WITH LASER Right 11/26/2014   Procedure: PHOTOCOAGULATION WITH LASER;  Surgeon: Sherrie George, MD;  Location: [facility] OR;  Service: Ophthalmology;  Laterality: Right;  . PHOTOCOAGULATION WITH LASER Left 08/21/2018   Procedure: PHOTOCOAGULATION WITH LASER;  Surgeon: Rennis Chris, MD;  Location: [facility] OR;  Service: Ophthalmology;  Laterality: Left;  . RETINAL DETACHMENT SURGERY    . SCLERAL BUCKLE Right 11/26/2014   Procedure: SCLERAL BUCKLE RIGHT EYE ;  Surgeon: Sherrie George, MD;  Location: [facility] OR;  Service: Ophthalmology;  Laterality: Right;  . SCLERAL BUCKLE WITH POSSIBLE 25 GAUGE PARS PLANA VITRECTOMY Left 08/21/2018   Procedure: SCLERAL BUCKLE WITH 25 GAUGE PARS PLANA VITRECTOMY;  Surgeon: Rennis Chris, MD;  Location: [facility] OR;  Service: Ophthalmology;  Laterality: Left;    FAMILY HISTORY Family History  Problem Relation Age of Onset  . Cancer Mother   . Heart failure Father     SOCIAL HISTORY Social History   Tobacco Use  . Smoking status: Former Smoker    Years: 10.00  . Smokeless tobacco: Never Used  . Tobacco comment: quit in 1970's  Substance Use Topics  . Alcohol use: No  . Drug use: No         OPHTHALMIC EXAM:  Base Eye Exam    Visual Acuity (Snellen - Linear)      Right Left   Dist cc 20/60 +1 20/100 +2    Dist ph cc 20/50 20/50 -1   Correction:  Glasses       Tonometry (Tonopen, 10:11 AM)      Right Left   Pressure 14 17       Pupils      Dark Light Shape React APD   Right 4 3 Round Brisk None   Left 5  Round  None       Visual Fields (Counting fingers)      Left Right     Full   Restrictions Partial outer inferior temporal, inferior nasal deficiencies        Extraocular Movement      Right Left    Full, Ortho Full, Ortho       Neuro/Psych    Oriented x3:  Yes   Mood/Affect:  Normal       Dilation  Both eyes:  1.0% Mydriacyl, 2.5% Phenylephrine @ 10:11 AM        Slit Lamp and Fundus Exam    External Exam      Right Left   External  Periorbital edema and ecchymoses -- improved       Slit Lamp Exam      Right Left   Lids/Lashes Dermatochalasis - upper lid, Meibomian gland dysfunction, Telangiectasia Dermatochalasis - upper lid, Meibomian gland dysfunction, Telangiectasia, trace Ecchymosis nasal upper lid   Conjunctiva/Sclera post surgical changes, healed nicely Subconjunctival hemorrhage almost clear, sutures dissolving   Cornea 2+ Punctate epithelial erosions, Temporal Well healed cataract wounds 3+ Punctate epithelial erosions   Anterior Chamber Deep and quiet deep, 1+ cell   Iris Round and dilated Round and moderately dilated   Lens PC IOL in good position, trace PCO PC IOL in good position   Vitreous Vitreous syneresis, mild pigment in anterior vitreous, Posterior vitreous detachment Post vitrectomy, 45-50% single gas bubble       Fundus Exam      Right Left   Disc  Compact, mildly Tilted disc, Peripapillary atrophy   C/D Ratio 0.2 0.3   Macula  Flat under gas   Vessels  Vascular attenuation   Periphery  Inferior retinal detachment from 3-6 oclock; horseshoe tear at 0500, two small holes at 0300; retina reattached over buckle; good buckle height; good laser surrounding buckle; mild subretinal heme IT quad just posteior to buckle -- improving           IMAGING AND PROCEDURES  Imaging and Procedures for @TODAY @  OCT, Retina - OU - Both Eyes       Right Eye Quality was good. Central Foveal Thickness: 294. Progression has been stable. Findings include normal foveal contour, no IRF, no SRF, epiretinal membrane.   Left Eye Quality was borderline. Central Foveal Thickness: 239. Progression has no prior data. Findings include normal foveal contour, no IRF, no SRF (Superior macula obscured by gas bubble).   Notes *Images captured and stored on drive  Diagnosis / Impression:  OD: mild ERM; NFP; no IRF/SRF OS: NFP; no IRF/SRF  Clinical management:  See below  Abbreviations: NFP - Normal foveal profile. CME - cystoid macular edema. PED - pigment epithelial detachment. IRF - intraretinal fluid. SRF - subretinal fluid. EZ - ellipsoid zone. ERM - epiretinal membrane. ORA - outer retinal atrophy. ORT - outer retinal tubulation. SRHM - subretinal hyper-reflective material                 ASSESSMENT/PLAN:    ICD-10-CM   1. 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 Retinal edema H35.81 OCT, Retina - OU - Both Eyes  3. History of retinal detachment Z86.69   4. 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 Retinal detachment, OS - macula-sparing inferior retinal detachment from 3-6 oclock - large HST at 0500 and 2 small tears at 300 within the detached retina - POW4 s/p SBP + PPV/PFC/EL/FAX/14% C3F8 OS, 10.07.19             - doing well             - retina attached and in good position -- good buckle height and laser around breaks  - mild subretinal heme inf temporal quadrant improving  - 1+ cell in AC             - IOP good today - 17 mmHg             -  cont   PF QID OS                         Atropine BID OS -- stop                         Cosopt BID OS                         PSO ung QID OS -- stop   AT's QID             - cont face down positioning half time; avoid laying flat on back             - post op drop and positioning  instructions reviewed             - tylenol/ibuprofen for pain             - Rx given for breakthrough pain  - f/u 3-4 weeks  2. No retinal edema on exam or OCT  3. History of retinal detachment OD - S/P SBP OD 11/26/14 -- Dr. Alan Mulder - looks great -- retina in great position - monitor  4. Pseudophakia OU  - s/p CE/IOL OU ([facility])  - doing well  - monitor   Ophthalmic Meds Ordered this visit:  No orders of the defined types were placed in this encounter.      Return for 3-4 weeks F/U RD OS.  There are no Patient Instructions on file for this visit.   Explained the diagnoses, plan, and follow up with the patient and they expressed understanding.  Patient expressed understanding of the importance of proper follow up care.   This document serves as a record of services personally performed by Karie Chimera, MD, PhD. It was created on their behalf by Laurian Brim, OA, an ophthalmic assistant. The creation of this record is the provider's dictation and/or activities during the visit.    Electronically signed by: Laurian Brim, OA  11.04.19 1:05 PM    Karie Chimera, M.D., Ph.D. Diseases & Surgery of the Retina and Vitreous [facility]   I have reviewed the above documentation for accuracy and completeness, and I agree with the above. Karie Chimera, M.D., Ph.D. 09/19/18 1:08 PM   Abbreviations: M myopia (nearsighted); A astigmatism; H hyperopia (farsighted); P presbyopia; Mrx spectacle prescription;  CTL contact lenses; OD right eye; OS left eye; OU both eyes  XT exotropia; ET esotropia; PEK punctate epithelial keratitis; PEE punctate epithelial erosions; DES dry eye syndrome; MGD meibomian gland dysfunction; ATs artificial tears; PFAT's preservative free artificial tears; NSC nuclear sclerotic cataract; PSC posterior subcapsular cataract; ERM epi-retinal membrane; PVD posterior vitreous detachment; RD retinal detachment; DM diabetes mellitus;  DR diabetic retinopathy; NPDR non-proliferative diabetic retinopathy; PDR proliferative diabetic retinopathy; CSME clinically significant macular edema; DME diabetic macular edema; dbh dot blot hemorrhages; CWS cotton wool spot; POAG primary open angle glaucoma; C/D cup-to-disc ratio; HVF humphrey visual field; GVF goldmann visual field; OCT optical coherence tomography; IOP intraocular pressure; BRVO Branch retinal vein occlusion; CRVO central retinal vein occlusion; CRAO central retinal artery occlusion; BRAO branch retinal artery occlusion; RT retinal tear; SB scleral buckle; PPV pars plana vitrectomy; VH Vitreous hemorrhage; PRP panretinal laser photocoagulation; IVK intravitreal kenalog; VMT vitreomacular traction; MH Macular hole;  NVD neovascularization of the disc; NVE neovascularization elsewhere; AREDS age related eye disease study;  ARMD age related macular degeneration; POAG primary open angle glaucoma; EBMD epithelial/anterior basement membrane dystrophy; ACIOL anterior chamber intraocular lens; IOL intraocular lens; PCIOL posterior chamber intraocular lens; Phaco/IOL phacoemulsification with intraocular lens placement; PRK photorefractive keratectomy; LASIK laser assisted in situ keratomileusis; HTN hypertension; DM diabetes mellitus; COPD chronic obstructive pulmonary disease  

## 2018-09-19 ENCOUNTER — Encounter (INDEPENDENT_AMBULATORY_CARE_PROVIDER_SITE_OTHER): Payer: Self-pay | Admitting: Ophthalmology

## 2018-09-19 ENCOUNTER — Ambulatory Visit (INDEPENDENT_AMBULATORY_CARE_PROVIDER_SITE_OTHER): Payer: Medicare Other | Admitting: Ophthalmology

## 2018-09-19 DIAGNOSIS — H3322 Serous retinal detachment, left eye: Secondary | ICD-10-CM

## 2018-09-19 DIAGNOSIS — H3581 Retinal edema: Secondary | ICD-10-CM

## 2018-09-19 DIAGNOSIS — Z8669 Personal history of other diseases of the nervous system and sense organs: Secondary | ICD-10-CM

## 2018-09-19 DIAGNOSIS — Z961 Presence of intraocular lens: Secondary | ICD-10-CM

## 2018-10-09 NOTE — Progress Notes (Signed)
[facility] - Clinic Note  10/10/2018     CHIEF COMPLAINT Patient presents for Post-op Follow-up   HISTORY OF PRESENT ILLNESS: Carla May is a 77 y.o. female who presents to the clinic today for:   HPI    Post-op Follow-up    In left eye.  Discomfort includes pain, itching, tearing and floaters.  Negative for foreign body sensation, discharge and none.  Vision is improved.  I, the attending physician,  performed the HPI with the patient and updated documentation appropriately.          Comments    S/P SBP OD (08/21/2108)- Patient states OS sore at times, vision improving OS, still sees gas bubble but getting smaller. Vision OD seems a little worse but patient states she's not sure if this is due to significant improvement in vision OS.       Last edited by Rennis ChrisZamora, Anibal Quinby, MD on 10/10/2018 10:39 AM. (History)    pt states she feels like her vision is improving, she states she feels like the gas bubble is less than 10% of her visual field, pt states she has had some pretty intense pain a few times, she states she's taken Tylenol for the pain when that happens  Referring physician: Maurice SmallGriffin, Elaine, MD [address] [place], [place] [address]  HISTORICAL INFORMATION:   Selected notes from the MEDICAL RECORD NUMBER Referred by Dr. Ashok CordiaMaria Johnson for concern of retinal hole LEE: Josefa Half(M. Johnson) [BCVA: OD: OS:] Ocular Hx- PMH-anxiety, arthritis, HLD    CURRENT MEDICATIONS: Current Outpatient Medications (Ophthalmic Drugs)  Medication Sig  . brimonidine (ALPHAGAN) 0.2 % ophthalmic solution Place 1 drop into the right eye 2 (two) times daily.  . carboxymethylcellulose (REFRESH PLUS) 0.5 % SOLN Place 1 drop into both eyes daily as needed (dry eyes).  . prednisoLONE acetate (PRED FORTE) 1 % ophthalmic suspension Place 1 drop into the right eye 4 (four) times daily.  . bacitracin-polymyxin b (POLYSPORIN) ophthalmic ointment Place 1 application into  the left eye 4 (four) times daily. Apply 1/4 inch ribbon to left eye four times a day (Patient not taking: Reported on 09/19/2018)  . gatifloxacin (ZYMAXID) 0.5 % SOLN Place 1 drop into the left eye 4 (four) times daily. (Patient not taking: Reported on 09/19/2018)  . latanoprost (XALATAN) 0.005 % ophthalmic solution Place 1 drop into the right eye at bedtime. (Patient not taking: Reported on 09/19/2018)  . prednisoLONE acetate (PRED FORTE) 1 % ophthalmic suspension Place 1 drop into the left eye 6 (six) times daily. (Patient not taking: Reported on 10/10/2018)   No current facility-administered medications for this visit.  (Ophthalmic Drugs)   Current Outpatient Medications (Other)  Medication Sig  . acetaminophen (TYLENOL ARTHRITIS PAIN) 650 MG CR tablet Take 1,300 mg by mouth every 8 (eight) hours as needed for pain.  Marland Kitchen. aspirin EC 81 MG tablet Take 81 mg by mouth daily.  Marland Kitchen. HYDROcodone-acetaminophen (NORCO/VICODIN) 5-325 MG tablet Take 1 tablet by mouth every 4 (four) hours as needed for moderate pain.  . Multiple Vitamins-Minerals (MULTIVITAMIN PO) Take 1 tablet by mouth daily.  . Omega-3 Fatty Acids (FISH OIL) 1000 MG CAPS Take 1,000 mg by mouth daily.  Marland Kitchen. omeprazole (PRILOSEC OTC) 20 MG tablet Take 20 mg by mouth daily.  . Pyridoxine HCl (VITAMIN B-6) 250 MG tablet Take 250 mg by mouth daily.   No current facility-administered medications for this visit.  (Other)      REVIEW OF  SYSTEMS: ROS    Positive for: Eyes   Negative for: Constitutional, Gastrointestinal, Neurological, Skin, Genitourinary, Musculoskeletal, HENT, Endocrine, Cardiovascular, Respiratory, Psychiatric, Allergic/Imm, Heme/Lymph   Last edited by Annalee Genta D on 10/10/2018  9:53 AM. (History)       ALLERGIES Allergies  Allergen Reactions  . Atorvastatin Other (See Comments)    Muscle aches/ memory loss  . Penicillins Itching and Nausea Only  . Sulfa Antibiotics Nausea Only    PAST MEDICAL HISTORY Past  Medical History:  Diagnosis Date  . Anxiety   . Arthritis   . GERD (gastroesophageal reflux disease)   . History of palpitations   . Hyperlipidemia   . MVP (mitral valve prolapse)    Past Surgical History:  Procedure Laterality Date  . ABDOMINAL HYSTERECTOMY  1970's  . CATARACT EXTRACTION    . EYE SURGERY Bilateral   . GAS INSERTION Right 11/26/2014   Procedure: INSERTION OF GAS;  Surgeon: Sherrie George, MD;  Location: [facility] OR;  Service: Ophthalmology;  Laterality: Right;  C3F8  . GAS/FLUID EXCHANGE Left 08/21/2018   Procedure: GAS/FLUID EXCHANGE;  Surgeon: Rennis Chris, MD;  Location: [facility] OR;  Service: Ophthalmology;  Laterality: Left;  C3F8  . KNEE ARTHROSCOPY Left   . PHOTOCOAGULATION WITH LASER Right 11/26/2014   Procedure: PHOTOCOAGULATION WITH LASER;  Surgeon: Sherrie George, MD;  Location: [facility] OR;  Service: Ophthalmology;  Laterality: Right;  . PHOTOCOAGULATION WITH LASER Left 08/21/2018   Procedure: PHOTOCOAGULATION WITH LASER;  Surgeon: Rennis Chris, MD;  Location: [facility] OR;  Service: Ophthalmology;  Laterality: Left;  . RETINAL DETACHMENT SURGERY    . SCLERAL BUCKLE Right 11/26/2014   Procedure: SCLERAL BUCKLE RIGHT EYE ;  Surgeon: Sherrie George, MD;  Location: [facility] OR;  Service: Ophthalmology;  Laterality: Right;  . SCLERAL BUCKLE WITH POSSIBLE 25 GAUGE PARS PLANA VITRECTOMY Left 08/21/2018   Procedure: SCLERAL BUCKLE WITH 25 GAUGE PARS PLANA VITRECTOMY;  Surgeon: Rennis Chris, MD;  Location: [facility] OR;  Service: Ophthalmology;  Laterality: Left;    FAMILY HISTORY Family History  Problem Relation Age of Onset  . Cancer Mother   . Heart failure Father     SOCIAL HISTORY Social History   Tobacco Use  . Smoking status: Former Smoker    Years: 10.00  . Smokeless tobacco: Never Used  . Tobacco comment: quit in 1970's  Substance Use Topics  . Alcohol use: No  . Drug use: No         OPHTHALMIC EXAM:  Base Eye Exam    Visual Acuity (Snellen - Linear)      Right Left    Dist cc 20/60 +1 20/40 -2   Dist ph cc 20/40 20/25 -2   Correction:  Glasses       Tonometry (Tonopen, 10:08 AM)      Right Left   Pressure 14 13       Pupils      Dark Light Shape React APD   Right 4 3 Round Brisk None   Left 6 5.5 Round Minimal None       Visual Fields (Counting fingers)      Left Right    Full Full       Extraocular Movement      Right Left    Full, Ortho Full, Ortho       Neuro/Psych    Oriented x3:  Yes   Mood/Affect:  Normal       Dilation    Both eyes:  1.0% Mydriacyl, 2.5% Phenylephrine @ 10:08 AM        Slit Lamp and Fundus Exam    External Exam      Right Left   External  Periorbital edema and ecchymoses -- improved       Slit Lamp Exam      Right Left   Lids/Lashes Dermatochalasis - upper lid, Meibomian gland dysfunction, Telangiectasia Dermatochalasis - upper lid, Meibomian gland dysfunction, Telangiectasia, trace Ecchymosis nasal upper lid   Conjunctiva/Sclera post surgical changes, healed nicely White and quiet, trace subconj heme at 0200   Cornea 2+ Punctate epithelial erosions, Temporal Well healed cataract wounds 2-3+ Punctate epithelial erosions   Anterior Chamber Deep and quiet deep, clear   Iris Round and dilated Round and dilated   Lens PC IOL in good position, trace PCO PC IOL in good position, trace Posterior capsular opacification   Vitreous Vitreous syneresis, mild pigment in anterior vitreous, Posterior vitreous detachment Post vitrectomy, 15-20% single gas bubble       Fundus Exam      Right Left   Disc Pink and Sharp Compact, mildly Tilted disc, Peripapillary atrophy   C/D Ratio 0.2 0.2   Macula Blunted foveal reflex, Retinal pigment epithelial mottling, No heme or edema Flat, Blunted foveal reflex, No heme or edema   Vessels Vascular attenuation Vascular attenuation   Periphery Attached over scleral buckle, good buckle height, good scarring over buckle Inferior retinal detachment from 3-6 oclock; horseshoe  tear at 0500, two small holes at 0300; retina reattached over buckle; good buckle height; good laser surrounding buckle; mild subretinal heme IT quad just posteior to buckle -- improving and turning white ?early fibrosis        Refraction    Wearing Rx      Sphere Cylinder Axis Add   Right -1.00 Sphere  +2.50   Left [place] +1.00 025 +2.50       Manifest Refraction (Retinoscopy)      Sphere Cylinder Axis Dist VA   Right -1.25 +0.50 005 20/50-2   Left -1.00 +1.00 180 20/30          IMAGING AND PROCEDURES  Imaging and Procedures for @TODAY @  OCT, Retina - OU - Both Eyes       Right Eye Quality was good. Central Foveal Thickness: 294. Progression has been stable. Findings include normal foveal contour, no IRF, no SRF, epiretinal membrane.   Left Eye Quality was good. Central Foveal Thickness: 288. Progression has improved. Findings include normal foveal contour, no IRF, no SRF, epiretinal membrane (Superior macula obscured by gas bubble).   Notes *Images captured and stored on drive  Diagnosis / Impression:  OD: mild ERM; NFP; no IRF/SRF OS: NFP; no IRF/SRF  Clinical management:  See below  Abbreviations: NFP - Normal foveal profile. CME - cystoid macular edema. PED - pigment epithelial detachment. IRF - intraretinal fluid. SRF - subretinal fluid. EZ - ellipsoid zone. ERM - epiretinal membrane. ORA - outer retinal atrophy. ORT - outer retinal tubulation. SRHM - subretinal hyper-reflective material                 ASSESSMENT/PLAN:    ICD-10-CM   1. 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 Retinal edema H35.81 OCT, Retina - OU - Both Eyes  3. History of retinal detachment Z86.69   4. 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 Retinal detachment, OS - macula-sparing inferior retinal detachment from 3-6 oclock - large HST at 0500 and  2 small tears at 300 within the detached retina - POW7 s/p SBP + PPV/PFC/EL/FAX/14% C3F8 OS, 10.07.19             - doing well              - retina attached and in good position -- good buckle height and laser around breaks  - mild subretinal heme inf temporal quadrant improving and turning white             - IOP good today - 13 mmHg             - cont   PF QID OS                         Cosopt -- decrease to QD OS   AT's QID OU             - d/c face down positioning; avoid laying flat on back             - post op drop and positioning instructions reviewed             - tylenol/ibuprofen for pain  - f/u 3-4 weeks  2. No retinal edema on exam or OCT  3. History of retinal detachment OD - S/P SBP OD 11/26/14 -- Dr. Alan Mulder - looks great -- retina in great position - monitor  4. Pseudophakia OU  - s/p CE/IOL OU ([place])  - doing well  - monitor   Ophthalmic Meds Ordered this visit:  No orders of the defined types were placed in this encounter.      Return for F/U week of December 30, RD OS, DFE, OCT.  There are no Patient Instructions on file for this visit.   Explained the diagnoses, plan, and follow up with the patient and they expressed understanding.  Patient expressed understanding of the importance of proper follow up care.   This document serves as a record of services personally performed by Karie Chimera, MD, PhD. It was created on their behalf by Laurian Brim, OA, an ophthalmic assistant. The creation of this record is the provider's dictation and/or activities during the visit.    Electronically signed by: Laurian Brim, OA  11.25.19 12:32 PM    Karie Chimera, M.D., Ph.D. Diseases & Surgery of the Retina and Vitreous [facility]   I have reviewed the above documentation for accuracy and completeness, and I agree with the above. Karie Chimera, M.D., Ph.D. 10/10/18 12:34 PM    Abbreviations: M myopia (nearsighted); A astigmatism; H hyperopia (farsighted); P presbyopia; Mrx spectacle prescription;  CTL contact lenses; OD right eye; OS left eye; OU both  eyes  XT exotropia; ET esotropia; PEK punctate epithelial keratitis; PEE punctate epithelial erosions; DES dry eye syndrome; MGD meibomian gland dysfunction; ATs artificial tears; PFAT's preservative free artificial tears; NSC nuclear sclerotic cataract; PSC posterior subcapsular cataract; ERM epi-retinal membrane; PVD posterior vitreous detachment; RD retinal detachment; DM diabetes mellitus; DR diabetic retinopathy; NPDR non-proliferative diabetic retinopathy; PDR proliferative diabetic retinopathy; CSME clinically significant macular edema; DME diabetic macular edema; dbh dot blot hemorrhages; CWS cotton wool spot; POAG primary open angle glaucoma; C/D cup-to-disc ratio; HVF humphrey visual field; GVF goldmann visual field; OCT optical coherence tomography; IOP intraocular pressure; BRVO Branch retinal vein occlusion; CRVO central retinal vein occlusion; CRAO central retinal artery occlusion; BRAO branch retinal artery occlusion; RT retinal tear; SB scleral buckle; PPV  pars plana vitrectomy; VH Vitreous hemorrhage; PRP panretinal laser photocoagulation; IVK intravitreal kenalog; VMT vitreomacular traction; MH Macular hole;  NVD neovascularization of the disc; NVE neovascularization elsewhere; AREDS age related eye disease study; ARMD age related macular degeneration; POAG primary open angle glaucoma; EBMD epithelial/anterior basement membrane dystrophy; ACIOL anterior chamber intraocular lens; IOL intraocular lens; PCIOL posterior chamber intraocular lens; Phaco/IOL phacoemulsification with intraocular lens placement; [place] photorefractive keratectomy; LASIK laser assisted in situ keratomileusis; HTN hypertension; DM diabetes mellitus; COPD chronic obstructive pulmonary disease

## 2018-10-10 ENCOUNTER — Ambulatory Visit (INDEPENDENT_AMBULATORY_CARE_PROVIDER_SITE_OTHER): Payer: Medicare Other | Admitting: Ophthalmology

## 2018-10-10 ENCOUNTER — Encounter (INDEPENDENT_AMBULATORY_CARE_PROVIDER_SITE_OTHER): Payer: Self-pay | Admitting: Ophthalmology

## 2018-10-10 DIAGNOSIS — Z961 Presence of intraocular lens: Secondary | ICD-10-CM

## 2018-10-10 DIAGNOSIS — H3581 Retinal edema: Secondary | ICD-10-CM

## 2018-10-10 DIAGNOSIS — H3322 Serous retinal detachment, left eye: Secondary | ICD-10-CM

## 2018-10-10 DIAGNOSIS — Z8669 Personal history of other diseases of the nervous system and sense organs: Secondary | ICD-10-CM

## 2018-10-19 ENCOUNTER — Telehealth (INDEPENDENT_AMBULATORY_CARE_PROVIDER_SITE_OTHER): Payer: Self-pay

## 2018-11-13 NOTE — Progress Notes (Signed)
[facility] - Clinic Note  11/16/2018     CHIEF COMPLAINT Patient presents for Post-op Follow-up   HISTORY OF PRESENT ILLNESS: Carla May is a 77 y.o. female who presents to the clinic today for:   HPI    Post-op Follow-up    In left eye.  Discomfort includes pain.  Vision is improved.  I, the attending physician,  performed the HPI with the patient and updated documentation appropriately.          Comments    77 y/o female pt here for s/p SBP for RD OS on 10.07.19.  VA improved OS.  No change in [place] OD.  Still has some minor pain OS when touching the eye, and sees "shadows" superiorly and temporally OS.  Denies flashes, floaters.  Using a "blue top" gtt QD OS and had been using Pred QID OS, but ran out.       Last edited by Rennis Chris, MD on 11/16/2018 10:36 AM. (History)    pt states she ran out of her PF a couple days ago, she states she still has pain that started about 3 weeks post op, she states she takes tylenol to help, she states it hurts to touch, she states it hurts in the corner by her nose, without the tylenol her pain level is an 8, with tylenol it is just a dull ache that she can tolerate, pt states the gas bubble is gone  Referring physician: Maurice Small, MD [address] [place], [place] [address]  HISTORICAL INFORMATION:   Selected notes from the MEDICAL RECORD NUMBER Referred by Dr. Ashok Cordia for concern of retinal hole LEE: Josefa Half) [BCVA: OD: OS:] Ocular Hx- PMH-anxiety, arthritis, HLD    CURRENT MEDICATIONS: Current Outpatient Medications (Ophthalmic Drugs)  Medication Sig  . bacitracin-polymyxin b (POLYSPORIN) ophthalmic ointment Place 1 application into the left eye 4 (four) times daily. Apply 1/4 inch ribbon to left eye four times a day (Patient not taking: Reported on 11/16/2018)  . brimonidine (ALPHAGAN) 0.2 % ophthalmic solution Place 1 drop into the right eye 2 (two) times daily. (Patient not  taking: Reported on 11/16/2018)  . carboxymethylcellulose (REFRESH PLUS) 0.5 % SOLN Place 1 drop into both eyes daily as needed (dry eyes).  Marland Kitchen gatifloxacin (ZYMAXID) 0.5 % SOLN Place 1 drop into the left eye 4 (four) times daily. (Patient not taking: Reported on 11/16/2018)  . latanoprost (XALATAN) 0.005 % ophthalmic solution Place 1 drop into the right eye at bedtime. (Patient not taking: Reported on 11/16/2018)  . prednisoLONE acetate (PRED FORTE) 1 % ophthalmic suspension Place 1 drop into the left eye 3 (three) times daily.   No current facility-administered medications for this visit.  (Ophthalmic Drugs)   Current Outpatient Medications (Other)  Medication Sig  . acetaminophen (TYLENOL ARTHRITIS PAIN) 650 MG CR tablet Take 1,300 mg by mouth every 8 (eight) hours as needed for pain.  Marland Kitchen aspirin EC 81 MG tablet Take 81 mg by mouth daily.  Marland Kitchen HYDROcodone-acetaminophen (NORCO/VICODIN) 5-325 MG tablet Take 1 tablet by mouth every 4 (four) hours as needed for moderate pain.  . Multiple Vitamins-Minerals (MULTIVITAMIN PO) Take 1 tablet by mouth daily.  . Omega-3 Fatty Acids (FISH OIL) 1000 MG CAPS Take 1,000 mg by mouth daily.  Marland Kitchen omeprazole (PRILOSEC OTC) 20 MG tablet Take 20 mg by mouth daily.  . Pyridoxine HCl (VITAMIN B-6) 250 MG tablet Take 250 mg by mouth daily.   No  current facility-administered medications for this visit.  (Other)      REVIEW OF SYSTEMS: ROS    Positive for: Eyes   Negative for: Constitutional, Gastrointestinal, Neurological, Skin, Genitourinary, Musculoskeletal, HENT, Endocrine, Cardiovascular, Respiratory, Psychiatric, Allergic/Imm, Heme/Lymph   Last edited by Celine MansBaxley, Andrew G, COA on 11/16/2018  9:55 AM. (History)       ALLERGIES Allergies  Allergen Reactions  . Atorvastatin Other (See Comments)    Muscle aches/ memory loss  . Penicillins Itching and Nausea Only  . Sulfa Antibiotics Nausea Only    PAST MEDICAL HISTORY Past Medical History:  Diagnosis Date  .  Anxiety   . Arthritis   . GERD (gastroesophageal reflux disease)   . History of palpitations   . Hyperlipidemia   . MVP (mitral valve prolapse)   . Retinal detachment    OS   Past Surgical History:  Procedure Laterality Date  . ABDOMINAL HYSTERECTOMY  1970's  . CATARACT EXTRACTION    . EYE SURGERY Bilateral   . GAS INSERTION Right 11/26/2014   Procedure: INSERTION OF GAS;  Surgeon: Sherrie GeorgeJohn D Matthews, MD;  [facility];  Service: Ophthalmology;  Laterality: Right;  C3F8  . GAS/FLUID EXCHANGE Left 08/21/2018   Procedure: GAS/FLUID EXCHANGE;  Surgeon: Rennis ChrisZamora, Flem Enderle, MD;  [facility];  Service: Ophthalmology;  Laterality: Left;  C3F8  . KNEE ARTHROSCOPY Left   . PHOTOCOAGULATION WITH LASER Right 11/26/2014   Procedure: PHOTOCOAGULATION WITH LASER;  Surgeon: Sherrie GeorgeJohn D Matthews, MD;  [facility];  Service: Ophthalmology;  Laterality: Right;  . PHOTOCOAGULATION WITH LASER Left 08/21/2018   Procedure: PHOTOCOAGULATION WITH LASER;  Surgeon: Rennis ChrisZamora, Chavonne Sforza, MD;  [facility];  Service: Ophthalmology;  Laterality: Left;  . RETINAL DETACHMENT SURGERY Left 08/21/2018   SBP - Dr. Sherryl MangesBrain Amauria Younts  . SCLERAL BUCKLE Right 11/26/2014   Procedure: SCLERAL BUCKLE RIGHT EYE ;  Surgeon: Sherrie GeorgeJohn D Matthews, MD;  [facility];  Service: Ophthalmology;  Laterality: Right;  . SCLERAL BUCKLE WITH POSSIBLE 25 GAUGE PARS PLANA VITRECTOMY Left 08/21/2018   Procedure: SCLERAL BUCKLE WITH 25 GAUGE PARS PLANA VITRECTOMY;  Surgeon: Rennis ChrisZamora, Caryssa Elzey, MD;  [facility];  Service: Ophthalmology;  Laterality: Left;    FAMILY HISTORY Family History  Problem Relation Age of Onset  . Cancer Mother   . Heart failure Father     SOCIAL HISTORY Social History   Tobacco Use  . Smoking status: Former Smoker    Years: 10.00  . Smokeless tobacco: Never Used  . Tobacco comment: quit in 1970's  Substance Use Topics  . Alcohol use: No  . Drug use: No         OPHTHALMIC EXAM:  Base Eye Exam    Visual Acuity  (Snellen - Linear)      Right Left   Dist cc 20/50 +2 20/25   Dist ph cc 20/40 + NI   Correction:  Glasses       Tonometry (Tonopen, 9:57 AM)      Right Left   Pressure 13 9       Pupils      Dark Light Shape React APD   Right 4 3 Round Slow None   Left 4 3 Round Slow None       Visual Fields (Counting fingers)      Left Right    Full Full       Extraocular Movement      Right Left    Full, Ortho Full, Ortho  Neuro/Psych    Oriented x3:  Yes   Mood/Affect:  Normal       Dilation    Both eyes:  1.0% Mydriacyl, 2.5% Phenylephrine @ 9:57 AM        Slit Lamp and Fundus Exam    External Exam      Right Left   External  Periorbital edema and ecchymoses -- improved       Slit Lamp Exam      Right Left   Lids/Lashes Mild Meibomian gland dysfunction, Telangiectasia Dermatochalasis - upper lid, Meibomian gland dysfunction, Telangiectasia, trace Ecchymosis nasal upper lid   Conjunctiva/Sclera post surgical changes, healed nicely White and quiet, trace subconj heme at 0200; nylon suture visible ST quadrant -- well covered, no exposure   Cornea 2+ Punctate epithelial erosions, Temporal Well healed cataract wounds 1-2+ inferior Punctate epithelial erosions   Anterior Chamber Deep and quiet deep 1/2+cell/pigment   Iris Round and dilated Round and dilated   Lens PC IOL in good position, trace PCO PC IOL in good position, trace Posterior capsular opacification   Vitreous Vitreous syneresis, mild pigment in anterior vitreous, Posterior vitreous detachment Post vitrectomy, gas bubble gone       Fundus Exam      Right Left   Disc Pink and Sharp Compact   C/D Ratio 0.2 0.2   Macula Blunted foveal reflex, Retinal pigment epithelial mottling, No heme or edema Flat, Blunted foveal reflex, mild ERM, No heme or edema   Vessels Vascular attenuation Vascular attenuation   Periphery Attached over scleral buckle, good buckle height, good scarring over buckle Originally: Inferior  retinal detachment from 3-6 oclock; horseshoe tear at 0500, two small holes at 0300;  Now: good buckle height; good laser surrounding buckle; early fibrosis temporally w/ mild +SRF posterior to buckle at 0400 meridian; ?punctate hole at 0400 posterior buckle          IMAGING AND PROCEDURES  Imaging and Procedures for @TODAY @  OCT, Retina - OU - Both Eyes       Right Eye Quality was good. Central Foveal Thickness: 291. Progression has been stable. Findings include normal foveal contour, no IRF, no SRF, epiretinal membrane.   Left Eye Quality was good. Central Foveal Thickness: 299. Progression has been stable. Findings include normal foveal contour, no IRF, no SRF, epiretinal membrane.   Notes *Images captured and stored on drive  Diagnosis / Impression:  OD: mild ERM; NFP; no IRF/SRF OS: NFP; no IRF/SRF  Clinical management:  See below  Abbreviations: NFP - Normal foveal profile. CME - cystoid macular edema. PED - pigment epithelial detachment. IRF - intraretinal fluid. SRF - subretinal fluid. EZ - ellipsoid zone. ERM - epiretinal membrane. ORA - outer retinal atrophy. ORT - outer retinal tubulation. SRHM - subretinal hyper-reflective material                 ASSESSMENT/PLAN:    ICD-10-CM   1. 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 Retinal edema H35.81 OCT, Retina - OU - Both Eyes  3. History of retinal detachment Z86.69   4. 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 No retinal edema on exam or OCT  3. History of retinal detachment OD - S/P SBP OD 11/26/14 -- Dr. Alan Mulder - looks great -- retina in great position - monitor  4. Pseudophakia OU  - s/p CE/IOL OU ([place])  - doing well  - monitor   Ophthalmic Meds Ordered this visit:  Meds ordered this encounter  Medications  . prednisoLONE acetate (PRED FORTE) 1 % ophthalmic suspension    Sig: Place 1 drop into the left eye 3 (three) times daily.    Dispense:  10 mL    Refill:  0       Return in about 2 weeks (around 11/30/2018) for F/U RD OS, DFE, OCT.  There are no Patient Instructions on file for this visit.   Explained the diagnoses, plan, and follow up with the patient and they expressed understanding.  Patient expressed understanding of the importance of proper follow up care.   This document serves as a record of services personally performed by Karie Chimera, MD, PhD. It was created on their behalf by Laurian Brim, OA, an ophthalmic assistant. The creation of this record is the provider's dictation and/or activities during the visit.    Electronically signed by: Laurian Brim, OA  12.30.19 11:48 PM    Karie Chimera, M.D., Ph.D. Diseases & Surgery of the Retina and Vitreous [facility]  I have reviewed the above documentation for accuracy and completeness, and I agree with the above. Karie Chimera, M.D., Ph.D. 11/16/18 11:48 PM   Abbreviations: M myopia (nearsighted); A astigmatism; H hyperopia (farsighted); P presbyopia; Mrx spectacle prescription;  CTL contact lenses; OD right eye; OS left eye; OU both eyes  XT exotropia; ET esotropia; PEK punctate epithelial keratitis; PEE punctate epithelial erosions; DES dry eye syndrome; MGD meibomian gland dysfunction; ATs artificial tears; PFAT's preservative free artificial tears; NSC  nuclear sclerotic cataract; PSC posterior subcapsular cataract; ERM epi-retinal membrane; PVD posterior vitreous detachment; RD retinal detachment; DM diabetes mellitus; DR diabetic retinopathy; NPDR non-proliferative diabetic retinopathy; PDR proliferative diabetic retinopathy; CSME clinically significant macular edema; DME diabetic macular edema; dbh dot blot hemorrhages; CWS cotton wool spot; POAG primary open angle glaucoma; C/D cup-to-disc ratio; HVF humphrey visual field; GVF goldmann visual field; OCT optical coherence tomography; IOP intraocular pressure; BRVO Branch retinal vein occlusion; CRVO central retinal vein occlusion; CRAO central retinal artery occlusion; BRAO branch retinal artery occlusion; RT retinal tear; SB scleral buckle; PPV pars plana vitrectomy; VH Vitreous hemorrhage; PRP panretinal laser photocoagulation; IVK intravitreal kenalog; VMT vitreomacular traction; MH Macular hole;  NVD neovascularization of the disc; NVE neovascularization elsewhere; AREDS age related eye disease study; ARMD age related macular degeneration; POAG primary open angle glaucoma; EBMD epithelial/anterior basement membrane dystrophy; ACIOL anterior chamber intraocular lens; IOL intraocular lens; PCIOL posterior chamber intraocular  lens; Phaco/IOL phacoemulsification with intraocular lens placement; [place] photorefractive keratectomy; LASIK laser assisted in situ keratomileusis; HTN hypertension; DM diabetes mellitus; COPD chronic obstructive pulmonary disease

## 2018-11-16 ENCOUNTER — Ambulatory Visit (INDEPENDENT_AMBULATORY_CARE_PROVIDER_SITE_OTHER): Payer: Medicare Other | Admitting: Ophthalmology

## 2018-11-16 ENCOUNTER — Encounter (INDEPENDENT_AMBULATORY_CARE_PROVIDER_SITE_OTHER): Payer: Self-pay | Admitting: Ophthalmology

## 2018-11-16 DIAGNOSIS — H3322 Serous retinal detachment, left eye: Secondary | ICD-10-CM

## 2018-11-16 DIAGNOSIS — Z8669 Personal history of other diseases of the nervous system and sense organs: Secondary | ICD-10-CM

## 2018-11-16 DIAGNOSIS — H3581 Retinal edema: Secondary | ICD-10-CM

## 2018-11-16 DIAGNOSIS — Z961 Presence of intraocular lens: Secondary | ICD-10-CM

## 2018-11-16 MED ORDER — PREDNISOLONE ACETATE 1 % OP SUSP: 1.0000 [drp] | Freq: Three times a day (TID) | OPHTHALMIC | 0 refills | Status: DC

## 2018-11-26 NOTE — Progress Notes (Signed)
[facility] - Clinic Note  11/30/2018     CHIEF COMPLAINT Patient presents for Post-op Follow-up   HISTORY OF PRESENT ILLNESS: Carla May is a 78 y.o. female who presents to the clinic today for:   HPI    Post-op Follow-up    In left eye.  Discomfort includes pain and floaters.  Vision is stable.  I, the attending physician,  performed the HPI with the patient and updated documentation appropriately.          Comments    78 y/o female pt here for 2 wk f/u.  S/p SBP for RD repair 10.07.19.  No change in [place] OU.  Has frequent pain OS at about a 5, but goes down to a 2 after taking tylenol.  Denies flashes, but has a constant "shadow" in her left temporal vision.  Cosopt QD OS.  Pred BID OS.       Last edited by Rennis Chris, MD on 11/30/2018  9:40 AM. (History)    pt states the pain that she had at last visit is easing up some, she states her vision is doing better also, she states when she lays on her side and watches TV she sees double vision, pt is using pred forte TID and cosopt Vita Barley  Referring physician: Maurice Small, MD [address] [place], [place] [address]  HISTORICAL INFORMATION:   Selected notes from the MEDICAL RECORD NUMBER Referred by Dr. Ashok Cordia for concern of retinal hole LEE: Josefa Half) [BCVA: OD: OS:] Ocular Hx- PMH-anxiety, arthritis, HLD    CURRENT MEDICATIONS: Current Outpatient Medications (Ophthalmic Drugs)  Medication Sig  . bacitracin-polymyxin b (POLYSPORIN) ophthalmic ointment Place 1 application into the left eye 4 (four) times daily. Apply 1/4 inch ribbon to left eye four times a day (Patient not taking: Reported on 11/16/2018)  . brimonidine (ALPHAGAN) 0.2 % ophthalmic solution Place 1 drop into the right eye 2 (two) times daily. (Patient not taking: Reported on 11/16/2018)  . carboxymethylcellulose (REFRESH PLUS) 0.5 % SOLN Place 1 drop into both eyes daily as needed (dry eyes).  Marland Kitchen gatifloxacin  (ZYMAXID) 0.5 % SOLN Place 1 drop into the left eye 4 (four) times daily. (Patient not taking: Reported on 11/16/2018)  . latanoprost (XALATAN) 0.005 % ophthalmic solution Place 1 drop into the right eye at bedtime. (Patient not taking: Reported on 11/16/2018)  . prednisoLONE acetate (PRED FORTE) 1 % ophthalmic suspension Place 1 drop into the left eye 3 (three) times daily.   No current facility-administered medications for this visit.  (Ophthalmic Drugs)   Current Outpatient Medications (Other)  Medication Sig  . acetaminophen (TYLENOL ARTHRITIS PAIN) 650 MG CR tablet Take 1,300 mg by mouth every 8 (eight) hours as needed for pain.  Marland Kitchen aspirin EC 81 MG tablet Take 81 mg by mouth daily.  Marland Kitchen HYDROcodone-acetaminophen (NORCO/VICODIN) 5-325 MG tablet Take 1 tablet by mouth every 4 (four) hours as needed for moderate pain.  . Multiple Vitamins-Minerals (MULTIVITAMIN PO) Take 1 tablet by mouth daily.  . Omega-3 Fatty Acids (FISH OIL) 1000 MG CAPS Take 1,000 mg by mouth daily.  Marland Kitchen omeprazole (PRILOSEC OTC) 20 MG tablet Take 20 mg by mouth daily.  . Pyridoxine HCl (VITAMIN B-6) 250 MG tablet Take 250 mg by mouth daily.   No current facility-administered medications for this visit.  (Other)      REVIEW OF SYSTEMS: ROS    Positive for: Eyes   Negative for: Constitutional, Gastrointestinal,  Neurological, Skin, Genitourinary, Musculoskeletal, HENT, Endocrine, Cardiovascular, Respiratory, Psychiatric, Allergic/Imm, Heme/Lymph   Last edited by Celine MansBaxley, Andrew G, COA on 11/30/2018  9:07 AM. (History)       ALLERGIES Allergies  Allergen Reactions  . Atorvastatin Other (See Comments)    Muscle aches/ memory loss  . Penicillins Itching and Nausea Only  . Sulfa Antibiotics Nausea Only    PAST MEDICAL HISTORY Past Medical History:  Diagnosis Date  . Anxiety   . Arthritis   . GERD (gastroesophageal reflux disease)   . History of palpitations   . Hyperlipidemia   . MVP (mitral valve prolapse)   .  Retinal detachment    OS   Past Surgical History:  Procedure Laterality Date  . ABDOMINAL HYSTERECTOMY  1970's  . CATARACT EXTRACTION    . EYE SURGERY Bilateral   . GAS INSERTION Right 11/26/2014   Procedure: INSERTION OF GAS;  Surgeon: Sherrie GeorgeJohn D Matthews, MD;  [facility];  Service: Ophthalmology;  Laterality: Right;  C3F8  . GAS/FLUID EXCHANGE Left 08/21/2018   Procedure: GAS/FLUID EXCHANGE;  Surgeon: Rennis ChrisZamora, Vergene Marland, MD;  [facility];  Service: Ophthalmology;  Laterality: Left;  C3F8  . KNEE ARTHROSCOPY Left   . PHOTOCOAGULATION WITH LASER Right 11/26/2014   Procedure: PHOTOCOAGULATION WITH LASER;  Surgeon: Sherrie GeorgeJohn D Matthews, MD;  [facility];  Service: Ophthalmology;  Laterality: Right;  . PHOTOCOAGULATION WITH LASER Left 08/21/2018   Procedure: PHOTOCOAGULATION WITH LASER;  Surgeon: Rennis ChrisZamora, Rosine Solecki, MD;  [facility];  Service: Ophthalmology;  Laterality: Left;  . RETINAL DETACHMENT SURGERY Left 08/21/2018   SBP - Dr. Sherryl MangesBrain Zo [place]  . SCLERAL BUCKLE Right 11/26/2014   Procedure: SCLERAL BUCKLE RIGHT EYE ;  Surgeon: Sherrie GeorgeJohn D Matthews, MD;  [facility];  Service: Ophthalmology;  Laterality: Right;  . SCLERAL BUCKLE WITH POSSIBLE 25 GAUGE PARS PLANA VITRECTOMY Left 08/21/2018   Procedure: SCLERAL BUCKLE WITH 25 GAUGE PARS PLANA VITRECTOMY;  Surgeon: Rennis ChrisZamora, Raybon Conard, MD;  [facility];  Service: Ophthalmology;  Laterality: Left;    FAMILY HISTORY Family History  Problem Relation Age of Onset  . Cancer Mother   . Heart failure Father     SOCIAL HISTORY Social History   Tobacco Use  . Smoking status: Former Smoker    Years: 10.00  . Smokeless tobacco: Never Used  . Tobacco comment: quit in 1970's  Substance Use Topics  . Alcohol use: No  . Drug use: No         OPHTHALMIC EXAM:  Base Eye Exam    Visual Acuity (Snellen - Linear)      Right Left   Dist cc 20/40 + 20/40 +   Dist ph cc NI 20/25 -   Correction:  Glasses       Tonometry (Tonopen, 9:14 AM)       Right Left   Pressure 13 11       Pupils      Dark Light Shape React APD   Right 4 3 Round Slow None   Left 6 5.5 Round Minimal None       Visual Fields (Counting fingers)      Left Right    Full Full       Extraocular Movement      Right Left    Full, Ortho Full, Ortho       Neuro/Psych    Oriented x3:  Yes   Mood/Affect:  Normal       Dilation    Both  eyes:  1.0% Mydriacyl, 2.5% Phenylephrine @ 9:14 AM        Slit Lamp and Fundus Exam    External Exam      Right Left   External  Periorbital edema and ecchymoses -- improved       Slit Lamp Exam      Right Left   Lids/Lashes Mild Meibomian gland dysfunction, Telangiectasia Dermatochalasis - upper lid, Meibomian gland dysfunction, Telangiectasia, trace Ecchymosis nasal upper lid   Conjunctiva/Sclera post surgical changes, healed nicely White and quiet, trace subconj heme at 0200; nylon suture visible ST quadrant -- well covered, no exposure   Cornea 2+ Punctate epithelial erosions, Temporal Well healed cataract wounds 1-2+ inferior Punctate epithelial erosions   Anterior Chamber Deep and quiet deep 1/2+cell/pigment   Iris Round and dilated Round and dilated   Lens PC IOL in good position, trace PCO PC IOL in good position, trace Posterior capsular opacification   Vitreous Vitreous syneresis, mild pigment in anterior vitreous, Posterior vitreous detachment Post vitrectomy, gas bubble gone       Fundus Exam      Right Left   Disc Pink and Sharp Compact   C/D Ratio 0.2 0.2   Macula Blunted foveal reflex, Retinal pigment epithelial mottling, No heme or edema Flat, Blunted foveal reflex, mild ERM, No heme or edema   Vessels Vascular attenuation Vascular attenuation   Periphery Attached over scleral buckle, good buckle height, good scarring over buckle Originally: Inferior retinal detachment from 3-6 oclock; horseshoe tear at 0500, two small holes at 0300;  Now: good buckle height; good laser surrounding buckle; early  fibrosis temporally w/ mild +SRF posterior to buckle at 0400 meridian; ?punctate hole at 0400 posterior buckle          IMAGING AND PROCEDURES  Imaging and Procedures for @TODAY @  OCT, Retina - OU - Both Eyes       Right Eye Quality was good. Central Foveal Thickness: 295. Progression has been stable. Findings include normal foveal contour, no IRF, no SRF, epiretinal membrane.   Left Eye Quality was good. Central Foveal Thickness: 307. Progression has been stable. Findings include normal foveal contour, no IRF, no SRF, epiretinal membrane.   Notes *Images captured and stored on drive  Diagnosis / Impression:  OD: mild ERM; NFP; no IRF/SRF OS: NFP; no IRF/SRF  Clinical management:  See below  Abbreviations: NFP - Normal foveal profile. CME - cystoid macular edema. PED - pigment epithelial detachment. IRF - intraretinal fluid. SRF - subretinal fluid. EZ - ellipsoid zone. ERM - epiretinal membrane. ORA - outer retinal atrophy. ORT - outer retinal tubulation. SRHM - subretinal hyper-reflective material                 ASSESSMENT/PLAN:    ICD-10-CM   1. 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 Retinal edema H35.81 OCT, Retina - OU - Both Eyes  4. History of retinal detachment Z86.69   5. Pseudophakia of both eyes Z96.1     1-3. Retinal detachment, OS - macula-sparing inferior retinal detachment from 3-6 oclock - large HST at 0500 and 2 small tears at 300 within the detached retina - POW14 s/p SBP + PPV/PFC/EL/FAX/14% C3F8 OS, 10.07.19             - BCVA good at 20/25 OS             - good buckle height and laser around breaks             -  IOP good today - 13 mmHg  - progressive fibrosis/PVR just posterior to buckle around 0400 with +SRF tracking posterior to buckle -- focal tractional/PVR detachment OS  - discussed findings, prognosis and treatment options  - recommend 25g PPV w/ membrane peel/PFO and gas vs silicon oil,  under general anesthesia  - pt wishes to proceed -- will plan for surgery on 12/14/18, possibly sooner if needed  - RBA of procedure discussed, questions answered  - informed consent obtained and signed             - cont PF TID OS for now   - Cosopt - cont QD OS  - ATs QID OU  - f/u next Tuesday, 1.21.20, for repeat exam and imaging  4. History of retinal detachment OD - S/P SBP OD 11/26/14 -- Dr. Alan Mulder - looks great -- retina in great position - monitor  5. Pseudophakia OU  - s/p CE/IOL OU ([facility])  - doing well  - monitor   Ophthalmic Meds Ordered this visit:  No orders of the defined types were placed in this encounter.      Return for f/u Tuesday, January 21, [place], [place].  There are no Patient Instructions on file for this visit.   Explained the diagnoses, plan, and follow up with the patient and they expressed understanding.  Patient expressed understanding of the importance of proper follow up care.   This document serves as a record of services personally performed by Karie Chimera, MD, PhD. It was created on their behalf by Laurian Brim, OA, an ophthalmic assistant. The creation of this record is the provider's dictation and/or activities during the visit.    Electronically signed by: Laurian Brim, OA  01.12.2020 12:14 AM    Karie Chimera, M.D., Ph.D. Diseases & Surgery of the Retina and Vitreous [facility]  I have reviewed the above documentation for accuracy and completeness, and I agree with the above. Karie Chimera, M.D., Ph.D. 12/01/18 12:22 AM    Abbreviations: M myopia (nearsighted); A astigmatism; H hyperopia (farsighted); P presbyopia; Mrx spectacle prescription;  CTL contact lenses; OD right eye; OS left eye; OU both eyes  XT exotropia; ET esotropia; PEK punctate epithelial keratitis; PEE punctate epithelial erosions; DES dry eye syndrome; MGD meibomian gland dysfunction; ATs artificial tears; PFAT's preservative free  artificial tears; NSC nuclear sclerotic cataract; PSC posterior subcapsular cataract; ERM epi-retinal membrane; PVD posterior vitreous detachment; RD retinal detachment; DM diabetes mellitus; DR diabetic retinopathy; NPDR non-proliferative diabetic retinopathy; PDR proliferative diabetic retinopathy; CSME clinically significant macular edema; DME diabetic macular edema; dbh dot blot hemorrhages; CWS cotton wool spot; POAG primary open angle glaucoma; C/D cup-to-disc ratio; HVF humphrey visual field; GVF goldmann visual field; OCT optical coherence tomography; IOP intraocular pressure; BRVO Branch retinal vein occlusion; CRVO central retinal vein occlusion; CRAO central retinal artery occlusion; BRAO branch retinal artery occlusion; RT retinal tear; SB scleral buckle; PPV pars plana vitrectomy; VH Vitreous hemorrhage; PRP panretinal laser photocoagulation; IVK intravitreal kenalog; VMT vitreomacular traction; MH Macular hole;  NVD neovascularization of the disc; NVE neovascularization elsewhere; AREDS age related eye disease study; ARMD age related macular degeneration; POAG primary open angle glaucoma; EBMD epithelial/anterior basement membrane dystrophy; ACIOL anterior chamber intraocular lens; IOL intraocular lens; PCIOL posterior chamber intraocular lens; Phaco/IOL phacoemulsification with intraocular lens placement; PRK photorefractive keratectomy; LASIK laser assisted in situ keratomileusis; HTN hypertension; DM diabetes mellitus; COPD chronic obstructive pulmonary disease

## 2018-11-30 ENCOUNTER — Ambulatory Visit (INDEPENDENT_AMBULATORY_CARE_PROVIDER_SITE_OTHER): Payer: Medicare Other | Admitting: Ophthalmology

## 2018-11-30 ENCOUNTER — Encounter (INDEPENDENT_AMBULATORY_CARE_PROVIDER_SITE_OTHER): Payer: Self-pay | Admitting: Ophthalmology

## 2018-11-30 DIAGNOSIS — H3581 Retinal edema: Secondary | ICD-10-CM

## 2018-11-30 DIAGNOSIS — Z961 Presence of intraocular lens: Secondary | ICD-10-CM

## 2018-11-30 DIAGNOSIS — H3322 Serous retinal detachment, left eye: Secondary | ICD-10-CM

## 2018-11-30 DIAGNOSIS — H3342 Traction detachment of retina, left eye: Secondary | ICD-10-CM | POA: Diagnosis not present

## 2018-11-30 DIAGNOSIS — Z8669 Personal history of other diseases of the nervous system and sense organs: Secondary | ICD-10-CM

## 2018-12-01 ENCOUNTER — Encounter (INDEPENDENT_AMBULATORY_CARE_PROVIDER_SITE_OTHER): Payer: Self-pay | Admitting: Ophthalmology

## 2018-12-04 NOTE — Progress Notes (Signed)
[facility] - Clinic Note  12/05/2018     CHIEF COMPLAINT Patient presents for Retina Follow Up   HISTORY OF PRESENT ILLNESS: Carla May is a 78 y.o. female who presents to the clinic today for:   HPI    Retina Follow Up    Patient presents with  Retinal Break/Detachment.  In left eye.  Duration of 5 days.  Since onset it is stable.  I, the attending physician,  performed the HPI with the patient and updated documentation appropriately.          Comments    Patient states vision the same OU. Still has floaters OS. No flashes. Shadow in left corner of vision OS slightly worse.        Last edited by Rennis ChrisZamora, Dillyn Joaquin, MD on 12/06/2018  8:32 AM. (History)    pt states the pain that she had at last visit is easing up some, she states her vision is doing better also, she states when she lays on her side and watches TV she sees double vision, pt is using pred forte TID and cosopt Vita BarleyQDaily  Referring physician: Maurice SmallGriffin, Elaine, MD [address] [place], [place] [id number]  HISTORICAL INFORMATION:   Selected notes from the MEDICAL RECORD NUMBER Referred by Dr. Ashok CordiaMaria Johnson for concern of retinal hole LEE: Josefa Half(M. Johnson) [BCVA: OD: OS:] Ocular Hx- PMH-anxiety, arthritis, HLD    CURRENT MEDICATIONS: Current Outpatient Medications (Ophthalmic Drugs)  Medication Sig  . carboxymethylcellulose (REFRESH PLUS) 0.5 % SOLN Place 1 drop into both eyes 3 (three) times daily as needed (dry eyes).   . dorzolamide-timolol (COSOPT) 22.3-6.8 MG/ML ophthalmic solution Place 1 drop into the left eye daily.   . prednisoLONE acetate (PRED FORTE) 1 % ophthalmic suspension Place 1 drop into the left eye 3 (three) times daily.  . bacitracin-polymyxin b (POLYSPORIN) ophthalmic ointment Place 1 application into the left eye 4 (four) times daily. Apply 1/4 inch ribbon to left eye four times a day (Patient not taking: Reported on 11/16/2018)  . gatifloxacin (ZYMAXID) 0.5 % SOLN  Place 1 drop into the left eye 4 (four) times daily. (Patient not taking: Reported on 11/16/2018)   No current facility-administered medications for this visit.  (Ophthalmic Drugs)   Current Outpatient Medications (Other)  Medication Sig  . acetaminophen (TYLENOL ARTHRITIS PAIN) 650 MG CR tablet Take 1,300 mg by mouth every 8 (eight) hours as needed for pain.  Marland Kitchen. aspirin EC 81 MG tablet Take 81 mg by mouth daily.  Marland Kitchen. HYDROcodone-acetaminophen (NORCO/VICODIN) 5-325 MG tablet Take 1 tablet by mouth every 4 (four) hours as needed for moderate pain. (Patient not taking: Reported on 12/05/2018)  . Omega-3 Fatty Acids (FISH OIL) 1000 MG CAPS Take 1,000 mg by mouth daily.  Marland Kitchen. omeprazole (PRILOSEC OTC) 20 MG tablet Take 20 mg by mouth daily.  . Multiple Vitamin (MULTIVITAMIN WITH MINERALS) TABS tablet Take 1 tablet by mouth daily. One-A-Day Multivitamin   No current facility-administered medications for this visit.  (Other)      REVIEW OF SYSTEMS: ROS    Positive for: Eyes   Negative for: Constitutional, Gastrointestinal, Neurological, Skin, Genitourinary, Musculoskeletal, HENT, Endocrine, Cardiovascular, Respiratory, Psychiatric, Allergic/Imm, Heme/Lymph   Last edited by Annalee GentaBarber, Daryl D on 12/05/2018  1:01 PM. (History)       ALLERGIES Allergies  Allergen Reactions  . Atorvastatin Other (See Comments)    Muscle aches/ memory loss  . Penicillins Itching and Nausea Only  . Sulfa Antibiotics  Nausea Only    PAST MEDICAL HISTORY Past Medical History:  Diagnosis Date  . Anxiety   . Arthritis   . GERD (gastroesophageal reflux disease)   . History of palpitations   . Hyperlipidemia   . MVP (mitral valve prolapse)   . PONV (postoperative nausea and vomiting)    No problems in 08/2018; however has had previous problems  . Pre-diabetes   . Retinal detachment    OS   Past Surgical History:  Procedure Laterality Date  . ABDOMINAL HYSTERECTOMY  1970's  . CATARACT EXTRACTION    . EYE  SURGERY Bilateral   . GAS INSERTION Right 11/26/2014   Procedure: INSERTION OF GAS;  Surgeon: Sherrie George, MD;  Location: [facility] OR;  Service: Ophthalmology;  Laterality: Right;  C3F8  . GAS/FLUID EXCHANGE Left 08/21/2018   Procedure: GAS/FLUID EXCHANGE;  Surgeon: Rennis Chris, MD;  Location: [facility] OR;  Service: Ophthalmology;  Laterality: Left;  C3F8  . KNEE ARTHROSCOPY Left   . PHOTOCOAGULATION WITH LASER Right 11/26/2014   Procedure: PHOTOCOAGULATION WITH LASER;  Surgeon: Sherrie George, MD;  Location[facility] OR;  Service: Ophthalmology;  Laterality: Right;  . PHOTOCOAGULATION WITH LASER Left 08/21/2018   Procedure: PHOTOCOAGULATION WITH LASER;  Surgeon: Rennis Chris, MD;  Location: [facility] OR;  Service: Ophthalmology;  Laterality: Left;  . RETINAL DETACHMENT SURGERY Left 08/21/2018   SBP - Dr. Sherryl Manges  . SCLERAL BUCKLE Right 11/26/2014   Procedure: SCLERAL BUCKLE RIGHT EYE ;  Surgeon: Sherrie George, MD;  Location: [facility] OR;  Service: Ophthalmology;  Laterality: Right;  . SCLERAL BUCKLE WITH POSSIBLE 25 GAUGE PARS PLANA VITRECTOMY Left 08/21/2018   Procedure: SCLERAL BUCKLE WITH 25 GAUGE PARS PLANA VITRECTOMY;  Surgeon: Rennis Chris, MD;  Location: [facility] OR;  Service: Ophthalmology;  Laterality: Left;    FAMILY HISTORY Family History  Problem Relation Age of Onset  . Cancer Mother   . Heart failure Father     SOCIAL HISTORY Social History   Tobacco Use  . Smoking status: Former Smoker    Years: 10.00  . Smokeless tobacco: Never Used  . Tobacco comment: quit in 1970's  Substance Use Topics  . Alcohol use: No  . Drug use: No         OPHTHALMIC EXAM:  Base Eye Exam    Visual Acuity (Snellen - Linear)      Right Left   Dist cc 20/40 -2 20/30 +1   Dist ph cc 20/30 -2 20/25 -1   Correction:  Glasses       Tonometry (Tonopen, 1:08 PM)      Right Left   Pressure 13 10       Pupils      Dark Light Shape React APD   Right 4 3.5 Round Minimal None   Left 6 5.5 Round Minimal  Trace       Visual Fields (Counting fingers)      Left Right    Full Full       Extraocular Movement      Right Left    Full, Ortho Full, Ortho       Neuro/Psych    Oriented x3:  Yes   Mood/Affect:  Normal       Dilation    Both eyes:  1.0% Mydriacyl, 2.5% Phenylephrine @ 1:08 PM        Slit Lamp and Fundus Exam    External Exam      Right Left   External  Periorbital edema and ecchymoses -- improved       Slit Lamp Exam      Right Left   Lids/Lashes Mild Meibomian gland dysfunction, Telangiectasia Dermatochalasis - upper lid, Meibomian gland dysfunction, Telangiectasia, trace Ecchymosis nasal upper lid   Conjunctiva/Sclera post surgical changes, healed nicely White and quiet, trace subconj heme at 0200; nylon suture visible ST quadrant -- well covered, no exposure   Cornea 2+ Punctate epithelial erosions, Temporal Well healed cataract wounds 1-2+ inferior Punctate epithelial erosions   Anterior Chamber Deep and quiet deep 1/2+cell/pigment   Iris Round and dilated Round and dilated   Lens PC IOL in good position, trace PCO PC IOL in good position, trace Posterior capsular opacification   Vitreous Vitreous syneresis, mild pigment in anterior vitreous, Posterior vitreous detachment Post vitrectomy, gas bubble gone       Fundus Exam      Right Left   Disc Pink and Sharp Compact   C/D Ratio 0.2 0.2   Macula Blunted foveal reflex, Retinal pigment epithelial mottling, No heme or edema Flat, Blunted foveal reflex, mild ERM, No heme or edema   Vessels Vascular attenuation Vascular attenuation   Periphery Attached over scleral buckle, good buckle height, good scarring over buckle Originally: Inferior retinal detachment from 3-6 oclock; horseshoe tear at 0500, two small holes at 0300; Now: good buckle height; good laser surrounding buckle; early fibrosis temporally w/ interval increase in SRF posterior to buckle at 0400 meridian; ?punctate hole at 0400 posterior buckle         Refraction    Wearing Rx      Sphere Cylinder Axis Add   Right -1.00 Sphere  +2.50   Left [place] +1.00 025 +2.50          IMAGING AND PROCEDURES  Imaging and Procedures for @TODAY @  OCT, Retina - OU - Both Eyes       Right Eye Quality was borderline. Central Foveal Thickness: 299. Progression has been stable. Findings include normal foveal contour, no IRF, no SRF, epiretinal membrane.   Left Eye Quality was good. Central Foveal Thickness: 301. Progression has worsened. Findings include normal foveal contour, no IRF, epiretinal membrane, subretinal fluid (+SRF temporal macula).   Notes *Images captured and stored on drive  Diagnosis / Impression:  OD: mild ERM; NFP; no IRF/SRF OS: NFP; no IRF/SRF centrally; interval increase in SRF temporal periphery posterior to buckle ~0400 (caught on widefield)  Clinical management:  See below  Abbreviations: NFP - Normal foveal profile. CME - cystoid macular edema. PED - pigment epithelial detachment. IRF - intraretinal fluid. SRF - subretinal fluid. EZ - ellipsoid zone. ERM - epiretinal membrane. ORA - outer retinal atrophy. ORT - outer retinal tubulation. SRHM - subretinal hyper-reflective material        Color Fundus Photography Optos - OU - Both Eyes       Right Eye Progression has been stable. Disc findings include normal observations. Macula : normal observations. Vessels : normal observations. Periphery : (Scleral buckle).   Left Eye Progression has worsened. Disc findings include normal observations. Macula : normal observations. Vessels : normal observations. Periphery : (Scleral buckle; +SRF posterior to buckle inf temp quad).   Notes **Images stored on drive**  Impression: OD: scleral buckle with retina attached OS:  Scleral buckle w/ interval increase in SRF / RD inf temp quadrant                 ASSESSMENT/PLAN:    ICD-10-CM   1.  Left retinal detachment H33.22 Color Fundus Photography Optos - OU -  Both Eyes  2. 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 Retinal edema H35.81 OCT, Retina - OU - Both Eyes  4. History of retinal detachment Z86.69 Color Fundus Photography Optos - OU - Both Eyes  5. Pseudophakia of both eyes Z96.1     1-3. Retinal detachment, OS - macula-sparing inferior retinal detachment from 3-6 oclock - large HST at 0500 and 2 small tears at 300 within the detached retina - POW14 s/p SBP + PPV/PFC/EL/FAX/14% C3F8 OS, 10.07.19             - BCVA good at 20/25 OS             - good buckle height and laser around breaks             - IOP good today - 13 mmHg  - progressive fibrosis/PVR just posterior to buckle around 0400 with +SRF tracking posterior to buckle -- focal tractional/PVR detachment OS -- interval increase in SRF from prior  - discussed findings, prognosis and treatment options  - recommend 25g PPV w/ membrane peel/PFO and gas vs silicon oil, under general anesthesia  - pt wishes to proceed -- given increased SRF, will move surgery up to tomorrow afternoon -- 12/06/18  - RBA of procedure discussed, questions answered  - informed consent obtained and signed             - cont PF TID OS for now   - Cosopt - cont QD OS  - ATs QID OU  - f/u Thursday, 1.23.20 for POV1  4. History of retinal detachment OD - S/P SBP OD 11/26/14 -- Dr. Alan Mulder - looks great -- retina in great position - monitor  5. Pseudophakia OU  - s/p CE/IOL OU (Bevis)  - doing well  - monitor   Ophthalmic Meds Ordered this visit:  No orders of the defined types were placed in this encounter.      Return in about 2 days (around 12/07/2018) for POV.  There are no Patient Instructions on file for this visit.   Explained the diagnoses, plan, and follow up with the patient and they expressed understanding.  Patient expressed understanding of the importance of proper follow up care.   This document serves as a record of  services personally performed by Karie Chimera, MD, PhD. It was created on their behalf by Laurian Brim, OA, an ophthalmic assistant. The creation of this record is the provider's dictation and/or activities during the visit.    Electronically signed by: Laurian Brim, OA  01.20.2020 12:20 PM     Karie Chimera, M.D., Ph.D. Diseases & Surgery of the Retina and Vitreous [facility]  I have reviewed the above documentation for accuracy and completeness, and I agree with the above. Karie Chimera, M.D., Ph.D. 12/06/18 12:20 PM     Abbreviations: M myopia (nearsighted); A astigmatism; H hyperopia (farsighted); P presbyopia; Mrx spectacle prescription;  CTL contact lenses; OD right eye; OS left eye; OU both eyes  XT exotropia; ET esotropia; PEK punctate epithelial keratitis; PEE punctate epithelial erosions; DES dry eye syndrome; MGD meibomian gland dysfunction; ATs artificial tears; PFAT's preservative free artificial tears; NSC nuclear sclerotic cataract; PSC posterior subcapsular cataract; ERM epi-retinal membrane; PVD posterior vitreous detachment; RD retinal detachment; DM diabetes mellitus; DR diabetic retinopathy; NPDR non-proliferative diabetic retinopathy; PDR proliferative  diabetic retinopathy; CSME clinically significant macular edema; DME diabetic macular edema; dbh dot blot hemorrhages; CWS cotton wool spot; POAG primary open angle glaucoma; C/D cup-to-disc ratio; HVF humphrey visual field; GVF goldmann visual field; OCT optical coherence tomography; IOP intraocular pressure; BRVO Branch retinal vein occlusion; CRVO central retinal vein occlusion; CRAO central retinal artery occlusion; BRAO branch retinal artery occlusion; RT retinal tear; SB scleral buckle; PPV pars plana vitrectomy; VH Vitreous hemorrhage; PRP panretinal laser photocoagulation; IVK intravitreal kenalog; VMT vitreomacular traction; MH Macular hole;  NVD neovascularization of the disc; NVE  neovascularization elsewhere; AREDS age related eye disease study; ARMD age related macular degeneration; POAG primary open angle glaucoma; EBMD epithelial/anterior basement membrane dystrophy; ACIOL anterior chamber intraocular lens; IOL intraocular lens; PCIOL posterior chamber intraocular lens; Phaco/IOL phacoemulsification with intraocular lens placement; [place] photorefractive keratectomy; LASIK laser assisted in situ keratomileusis; HTN hypertension; DM diabetes mellitus; COPD chronic obstructive pulmonary disease

## 2018-12-05 ENCOUNTER — Encounter (INDEPENDENT_AMBULATORY_CARE_PROVIDER_SITE_OTHER): Payer: Self-pay | Admitting: Ophthalmology

## 2018-12-05 ENCOUNTER — Encounter (HOSPITAL_COMMUNITY): Payer: Self-pay | Admitting: *Deleted

## 2018-12-05 ENCOUNTER — Ambulatory Visit (INDEPENDENT_AMBULATORY_CARE_PROVIDER_SITE_OTHER): Payer: Medicare Other | Admitting: Ophthalmology

## 2018-12-05 DIAGNOSIS — H3342 Traction detachment of retina, left eye: Secondary | ICD-10-CM

## 2018-12-05 DIAGNOSIS — Z8669 Personal history of other diseases of the nervous system and sense organs: Secondary | ICD-10-CM

## 2018-12-05 DIAGNOSIS — H3581 Retinal edema: Secondary | ICD-10-CM

## 2018-12-05 DIAGNOSIS — H3322 Serous retinal detachment, left eye: Secondary | ICD-10-CM

## 2018-12-05 DIAGNOSIS — Z961 Presence of intraocular lens: Secondary | ICD-10-CM

## 2018-12-05 NOTE — H&P (Signed)
Carla May is an 78 y.o. female.    Chief Complaint: PVR tractional retinal detachment, left eye  HPI: 78 yo F with history of rhegmatogenous retinal detachment of left eye, s/p SBP + PPV w/ EL and gas OS on 10.7.19. During post-op f/u was noted to have PVR tractional redetachment w/ corresponding visual field loss OS.  Past Medical History:  Diagnosis Date  . Anxiety   . Arthritis   . GERD (gastroesophageal reflux disease)   . History of palpitations   . Hyperlipidemia   . MVP (mitral valve prolapse)   . PONV (postoperative nausea and vomiting)    No problems in 08/2018; however has had previous problems  . Pre-diabetes   . Retinal detachment    OS    Past Surgical History:  Procedure Laterality Date  . ABDOMINAL HYSTERECTOMY  1970's  . CATARACT EXTRACTION    . EYE SURGERY Bilateral   . GAS INSERTION Right 11/26/2014   Procedure: INSERTION OF GAS;  Surgeon: Sherrie George, MD;  Location: [facility] OR;  Service: Ophthalmology;  Laterality: Right;  C3F8  . GAS/FLUID EXCHANGE Left 08/21/2018   Procedure: GAS/FLUID EXCHANGE;  Surgeon: Rennis Chris, MD;  Location: [facility] OR;  Service: Ophthalmology;  Laterality: Left;  C3F8  . KNEE ARTHROSCOPY Left   . PHOTOCOAGULATION WITH LASER Right 11/26/2014   Procedure: PHOTOCOAGULATION WITH LASER;  Surgeon: Sherrie George, MD;  Location: [facility] OR;  Service: Ophthalmology;  Laterality: Right;  . PHOTOCOAGULATION WITH LASER Left 08/21/2018   Procedure: PHOTOCOAGULATION WITH LASER;  Surgeon: Rennis Chris, MD;  Location[facility] OR;  Service: Ophthalmology;  Laterality: Left;  . RETINAL DETACHMENT SURGERY Left 08/21/2018   SBP - Dr. Sherryl Manges  . SCLERAL BUCKLE Right 11/26/2014   Procedure: SCLERAL BUCKLE RIGHT EYE ;  Surgeon: Sherrie George, MD;  Location: [facility] OR;  Service: Ophthalmology;  Laterality: Right;  . SCLERAL BUCKLE WITH POSSIBLE 25 GAUGE PARS PLANA VITRECTOMY Left 08/21/2018   Procedure: SCLERAL BUCKLE WITH 25 GAUGE PARS PLANA VITRECTOMY;   Surgeon: Rennis Chris, MD;  Location: [facility] OR;  Service: Ophthalmology;  Laterality: Left;    Family History  Problem Relation Age of Onset  . Cancer Mother   . Heart failure Father    Social History:  reports that she has quit smoking. She quit after 10.00 years of use. She has never used smokeless tobacco. She reports that she does not drink alcohol or use drugs.  Allergies:  Allergies  Allergen Reactions  . Atorvastatin Other (See Comments)    Muscle aches/ memory loss  . Penicillins Itching and Nausea Only  . Sulfa Antibiotics Nausea Only    No medications prior to admission.    Review of systems otherwise negative  There were no vitals taken for this visit.  Physical exam: Mental status: oriented x3. Eyes: See eye exam associated with this date of surgery Ears, Nose, Throat: within normal limits Neck: Within Normal limits General: within normal limits Chest: Within normal limits Breast: deferred Heart: Within normal limits Abdomen: Within normal limits GU: deferred Extremities: within normal limits Skin: within normal limits  Assessment/Plan 1. Inferotemporal PVR tractional retinal detachment, left eye  Plan: To [facility] for 25g PPV with PFO, membrane peel and silicon oil injection, OS, under general anesthesia - case scheduled for 1.22.20, [facility] OR 08  Karie Chimera, M.D., Ph.D. Vitreoretinal Surgeon [facility]

## 2018-12-06 ENCOUNTER — Encounter (INDEPENDENT_AMBULATORY_CARE_PROVIDER_SITE_OTHER): Payer: Self-pay | Admitting: Ophthalmology

## 2018-12-06 ENCOUNTER — Encounter (HOSPITAL_COMMUNITY): Payer: Self-pay | Admitting: Certified Registered Nurse Anesthetist

## 2018-12-06 ENCOUNTER — Ambulatory Visit (HOSPITAL_COMMUNITY): Payer: Medicare Other | Admitting: Certified Registered Nurse Anesthetist

## 2018-12-06 ENCOUNTER — Ambulatory Visit (HOSPITAL_COMMUNITY)
Admission: RE | Admit: 2018-12-06 | Discharge: 2018-12-06 | Disposition: A | Discharge status: Home / Self Care | Payer: Medicare Other | Attending: Ophthalmology | Admitting: Ophthalmology

## 2018-12-06 ENCOUNTER — Encounter (HOSPITAL_COMMUNITY)
Admission: RE | Disposition: A | Discharge status: Home / Self Care | Payer: Self-pay | Source: Home / Self Care | Attending: Ophthalmology

## 2018-12-06 DIAGNOSIS — H3342 Traction detachment of retina, left eye: Secondary | ICD-10-CM | POA: Insufficient documentation

## 2018-12-06 DIAGNOSIS — Z7982 Long term (current) use of aspirin: Secondary | ICD-10-CM | POA: Diagnosis not present

## 2018-12-06 DIAGNOSIS — Z87891 Personal history of nicotine dependence: Secondary | ICD-10-CM | POA: Insufficient documentation

## 2018-12-06 DIAGNOSIS — H338 Other retinal detachments: Secondary | ICD-10-CM | POA: Diagnosis not present

## 2018-12-06 DIAGNOSIS — K219 Gastro-esophageal reflux disease without esophagitis: Secondary | ICD-10-CM | POA: Diagnosis not present

## 2018-12-06 DIAGNOSIS — Z79899 Other long term (current) drug therapy: Secondary | ICD-10-CM | POA: Insufficient documentation

## 2018-12-06 HISTORY — DX: Other specified postprocedural states: Z98.890

## 2018-12-06 HISTORY — PX: REPAIR OF COMPLEX TRACTION RETINAL DETACHMENT: SHX6217

## 2018-12-06 HISTORY — DX: Prediabetes: R73.03

## 2018-12-06 HISTORY — DX: Nausea with vomiting, unspecified: R11.2

## 2018-12-06 LAB — GLUCOSE, CAPILLARY
GLUCOSE-CAPILLARY: 94 mg/dL (ref 70–99)
Glucose-Capillary: 123 mg/dL — ABNORMAL HIGH (ref 70–99)

## 2018-12-06 LAB — CBC
HCT: 44.5 % (ref 36.0–46.0)
Hemoglobin: 14.3 g/dL (ref 12.0–15.0)
MCH: 29.9 pg (ref 26.0–34.0)
MCHC: 32.1 g/dL (ref 30.0–36.0)
MCV: 93.1 fL (ref 80.0–100.0)
Platelets: 259 10*3/uL (ref 150–400)
RBC: 4.78 MIL/uL (ref 3.87–5.11)
RDW: 12.6 % (ref 11.5–15.5)
WBC: 9.1 10*3/uL (ref 4.0–10.5)
nRBC: 0 % (ref 0.0–0.2)

## 2018-12-06 LAB — BASIC METABOLIC PANEL
Anion gap: 10 (ref 5–15)
BUN: 18 mg/dL (ref 8–23)
CO2: 24 mmol/L (ref 22–32)
Calcium: 9.8 mg/dL (ref 8.9–10.3)
Chloride: 104 mmol/L (ref 98–111)
Creatinine, Ser: 0.79 mg/dL (ref 0.44–1.00)
GFR calc non Af Amer: >60 mL/min (ref >60)
Glucose, Bld: 104 mg/dL — ABNORMAL HIGH (ref 70–99)
Potassium: 4.2 mmol/L (ref 3.5–5.1)
Sodium: 138 mmol/L (ref 135–145)

## 2018-12-06 SURGERY — REPAIR, RETINAL DETACHMENT, COMPLEX
Anesthesia: General | Site: Eye | Laterality: Left

## 2018-12-06 MED ORDER — NA CHONDROIT SULF-NA HYALURON 40-30 MG/ML IO SOLN
INTRAOCULAR | Status: DC | PRN
  Administered 2018-12-06: 0.5 mL via INTRAOCULAR

## 2018-12-06 MED ORDER — CEFTAZIDIME 1 G IJ SOLR
INTRAMUSCULAR | Status: AC
  Filled 2018-12-06: qty 1

## 2018-12-06 MED ORDER — ATROPINE SULFATE 1 % OP SOLN
1.0000 [drp] | OPHTHALMIC | Status: AC | PRN
  Administered 2018-12-06 (×3): 1 [drp] via OPHTHALMIC
  Filled 2018-12-06: qty 2

## 2018-12-06 MED ORDER — BACITRACIN-POLYMYXIN B 500-10000 UNIT/GM OP OINT
TOPICAL_OINTMENT | OPHTHALMIC | Status: DC | PRN
  Administered 2018-12-06: 1 via OPHTHALMIC

## 2018-12-06 MED ORDER — EPINEPHRINE PF 1 MG/ML IJ SOLN
INTRAOCULAR | Status: DC | PRN
  Administered 2018-12-06: 500.3 mL

## 2018-12-06 MED ORDER — PROPOFOL 10 MG/ML IV BOLUS
INTRAVENOUS | Status: DC | PRN
  Administered 2018-12-06: 140 mg via INTRAVENOUS

## 2018-12-06 MED ORDER — SODIUM CHLORIDE 0.9 % IV SOLN
INTRAVENOUS | Status: DC | PRN
  Administered 2018-12-06: 20 ug/min via INTRAVENOUS

## 2018-12-06 MED ORDER — EPINEPHRINE PF 1 MG/ML IJ SOLN
INTRAMUSCULAR | Status: AC
  Filled 2018-12-06: qty 1

## 2018-12-06 MED ORDER — PROPOFOL 500 MG/50ML IV EMUL
INTRAVENOUS | Status: DC | PRN
  Administered 2018-12-06: 150 ug/kg/min via INTRAVENOUS

## 2018-12-06 MED ORDER — FENTANYL CITRATE (PF) 250 MCG/5ML IJ SOLN
INTRAMUSCULAR | Status: DC | PRN
  Administered 2018-12-06: 50 ug via INTRAVENOUS
  Administered 2018-12-06 (×2): 100 ug via INTRAVENOUS

## 2018-12-06 MED ORDER — BUPIVACAINE HCL (PF) 0.75 % IJ SOLN
INTRAMUSCULAR | Status: AC
  Filled 2018-12-06: qty 10

## 2018-12-06 MED ORDER — ROCURONIUM BROMIDE 50 MG/5ML IV SOSY
PREFILLED_SYRINGE | INTRAVENOUS | Status: AC
  Filled 2018-12-06: qty 5

## 2018-12-06 MED ORDER — BRIMONIDINE TARTRATE 0.2 % OP SOLN
OPHTHALMIC | Status: AC
  Filled 2018-12-06: qty 5

## 2018-12-06 MED ORDER — TOBRAMYCIN-DEXAMETHASONE 0.3-0.1 % OP OINT
TOPICAL_OINTMENT | OPHTHALMIC | Status: AC
  Filled 2018-12-06: qty 3.5

## 2018-12-06 MED ORDER — PREDNISOLONE ACETATE 1 % OP SUSP
OPHTHALMIC | Status: DC | PRN
  Administered 2018-12-06: 1 [drp] via OPHTHALMIC

## 2018-12-06 MED ORDER — STERILE WATER FOR INJECTION IJ SOLN
INTRAMUSCULAR | Status: DC | PRN
  Administered 2018-12-06: 20 mL

## 2018-12-06 MED ORDER — GATIFLOXACIN 0.5 % OP SOLN
OPHTHALMIC | Status: AC
  Filled 2018-12-06: qty 2.5

## 2018-12-06 MED ORDER — PROPARACAINE HCL 0.5 % OP SOLN
1.0000 [drp] | OPHTHALMIC | Status: AC | PRN
  Administered 2018-12-06 (×3): 1 [drp] via OPHTHALMIC
  Filled 2018-12-06: qty 15

## 2018-12-06 MED ORDER — LIDOCAINE HCL (PF) 2 % IJ SOLN
INTRAMUSCULAR | Status: AC
  Filled 2018-12-06: qty 10

## 2018-12-06 MED ORDER — PHENYLEPHRINE HCL 10 % OP SOLN
1.0000 [drp] | OPHTHALMIC | Status: AC | PRN
  Administered 2018-12-06 (×3): 1 [drp] via OPHTHALMIC
  Filled 2018-12-06: qty 5

## 2018-12-06 MED ORDER — TRIAMCINOLONE ACETONIDE 40 MG/ML IJ SUSP
INTRAMUSCULAR | Status: DC | PRN
  Administered 2018-12-06: 40 mg

## 2018-12-06 MED ORDER — DEXAMETHASONE SODIUM PHOSPHATE 10 MG/ML IJ SOLN
INTRAMUSCULAR | Status: DC | PRN
  Administered 2018-12-06: 5 mg via INTRAVENOUS

## 2018-12-06 MED ORDER — INDOCYANINE GREEN 25 MG IV SOLR
INTRAVENOUS | Status: DC | PRN
  Administered 2018-12-06: 25 mg via OPHTHALMIC

## 2018-12-06 MED ORDER — SODIUM CHLORIDE 0.9 % IV SOLN
INTRAVENOUS | Status: AC
  Filled 2018-12-06: qty 500000

## 2018-12-06 MED ORDER — BACITRACIN-POLYMYXIN B 500-10000 UNIT/GM OP OINT
TOPICAL_OINTMENT | OPHTHALMIC | Status: AC
  Filled 2018-12-06: qty 3.5

## 2018-12-06 MED ORDER — ONDANSETRON HCL 4 MG/2ML IJ SOLN
INTRAMUSCULAR | Status: DC | PRN
  Administered 2018-12-06: 4 mg via INTRAVENOUS

## 2018-12-06 MED ORDER — PREDNISOLONE ACETATE 1 % OP SUSP
OPHTHALMIC | Status: AC
  Filled 2018-12-06: qty 5

## 2018-12-06 MED ORDER — ROCURONIUM BROMIDE 10 MG/ML (PF) SYRINGE
PREFILLED_SYRINGE | INTRAVENOUS | Status: DC | PRN
  Administered 2018-12-06: 50 mg via INTRAVENOUS
  Administered 2018-12-06 (×2): 20 mg via INTRAVENOUS

## 2018-12-06 MED ORDER — DORZOLAMIDE HCL-TIMOLOL MAL 2-0.5 % OP SOLN
OPHTHALMIC | Status: AC
  Filled 2018-12-06: qty 10

## 2018-12-06 MED ORDER — BSS PLUS IO SOLN
INTRAOCULAR | Status: AC
  Filled 2018-12-06: qty 500

## 2018-12-06 MED ORDER — ARTIFICIAL TEARS OPHTHALMIC OINT
TOPICAL_OINTMENT | OPHTHALMIC | Status: DC | PRN
  Administered 2018-12-06: 1 via OPHTHALMIC

## 2018-12-06 MED ORDER — BSS IO SOLN
INTRAOCULAR | Status: AC
  Filled 2018-12-06: qty 15

## 2018-12-06 MED ORDER — ACETAZOLAMIDE SODIUM 500 MG IJ SOLR
INTRAMUSCULAR | Status: AC
  Filled 2018-12-06: qty 500

## 2018-12-06 MED ORDER — DEXTROSE 50 % IV SOLN
INTRAVENOUS | Status: AC
  Filled 2018-12-06: qty 50

## 2018-12-06 MED ORDER — SODIUM CHLORIDE 0.9 % IV SOLN
INTRAVENOUS | Status: DC
  Administered 2018-12-06 (×2): via INTRAVENOUS

## 2018-12-06 MED ORDER — ATROPINE SULFATE 1 % OP SOLN
OPHTHALMIC | Status: AC
  Filled 2018-12-06: qty 5

## 2018-12-06 MED ORDER — STERILE WATER FOR INJECTION IJ SOLN
INTRAMUSCULAR | Status: AC
  Filled 2018-12-06: qty 10

## 2018-12-06 MED ORDER — CARBACHOL 0.01 % IO SOLN
INTRAOCULAR | Status: AC
  Filled 2018-12-06: qty 1.5

## 2018-12-06 MED ORDER — ONDANSETRON HCL 4 MG/2ML IJ SOLN
INTRAMUSCULAR | Status: AC
  Filled 2018-12-06: qty 6

## 2018-12-06 MED ORDER — TRIAMCINOLONE ACETONIDE 40 MG/ML IJ SUSP
INTRAMUSCULAR | Status: AC
  Filled 2018-12-06: qty 5

## 2018-12-06 MED ORDER — NA CHONDROIT SULF-NA HYALURON 40-30 MG/ML IO SOLN
INTRAOCULAR | Status: AC
  Filled 2018-12-06: qty 1

## 2018-12-06 MED ORDER — LIDOCAINE 2% (20 MG/ML) 5 ML SYRINGE
INTRAMUSCULAR | Status: AC
  Filled 2018-12-06: qty 5

## 2018-12-06 MED ORDER — INDOCYANINE GREEN 25 MG IV SOLR
INTRAVENOUS | Status: AC
  Filled 2018-12-06: qty 25

## 2018-12-06 MED ORDER — TROPICAMIDE 1 % OP SOLN
1.0000 [drp] | OPHTHALMIC | Status: AC | PRN
  Administered 2018-12-06 (×3): 1 [drp] via OPHTHALMIC
  Filled 2018-12-06: qty 15

## 2018-12-06 MED ORDER — POLYMYXIN B SULFATE 500000 UNITS IJ SOLR
INTRAMUSCULAR | Status: AC
  Filled 2018-12-06: qty 500000

## 2018-12-06 MED ORDER — LIDOCAINE HCL (PF) 4 % IJ SOLN
INTRAMUSCULAR | Status: AC
  Filled 2018-12-06: qty 5

## 2018-12-06 MED ORDER — LIDOCAINE 2% (20 MG/ML) 5 ML SYRINGE
INTRAMUSCULAR | Status: DC | PRN
  Administered 2018-12-06: 60 mg via INTRAVENOUS

## 2018-12-06 MED ORDER — DEXAMETHASONE SODIUM PHOSPHATE 10 MG/ML IJ SOLN
INTRAMUSCULAR | Status: AC
  Filled 2018-12-06: qty 1

## 2018-12-06 MED ORDER — SODIUM CHLORIDE (PF) 0.9 % IJ SOLN
INTRAMUSCULAR | Status: AC
  Filled 2018-12-06: qty 10

## 2018-12-06 MED ORDER — PROPOFOL 10 MG/ML IV BOLUS
INTRAVENOUS | Status: AC
  Filled 2018-12-06: qty 20

## 2018-12-06 MED ORDER — SUGAMMADEX SODIUM 200 MG/2ML IV SOLN
INTRAVENOUS | Status: DC | PRN
  Administered 2018-12-06: 200 mg via INTRAVENOUS

## 2018-12-06 MED ORDER — FENTANYL CITRATE (PF) 250 MCG/5ML IJ SOLN
INTRAMUSCULAR | Status: AC
  Filled 2018-12-06: qty 5

## 2018-12-06 MED ORDER — PHENYLEPHRINE HCL 10 MG/ML IJ SOLN
INTRAMUSCULAR | Status: AC
  Filled 2018-12-06: qty 1

## 2018-12-06 SURGICAL SUPPLY — 62 items
APPLICATOR COTTON TIP 6 STRL (MISCELLANEOUS) ×4 IMPLANT
APPLICATOR COTTON TIP 6IN STRL (MISCELLANEOUS) ×8
BLADE EYE CATARACT 19 1.4 BEAV (BLADE) IMPLANT
BNDG EYE OVAL (GAUZE/BANDAGES/DRESSINGS) ×2 IMPLANT
CABLE BIPOLOR RESECTION CORD (MISCELLANEOUS) ×2 IMPLANT
CANNULA ANT CHAM MAIN (OPHTHALMIC RELATED) IMPLANT
CANNULA DUALBORE 25G (CANNULA) ×2 IMPLANT
CANNULA FLEX TIP 25G (CANNULA) ×2 IMPLANT
CANNULA TROCAR 23 GA VLV (OPHTHALMIC) IMPLANT
CANNULA VLV SOFT TIP 25GA (OPHTHALMIC) IMPLANT
CLSR STERI-STRIP ANTIMIC 1/2X4 (GAUZE/BANDAGES/DRESSINGS) ×2 IMPLANT
COTTONBALL LRG STERILE PKG (GAUZE/BANDAGES/DRESSINGS) ×6 IMPLANT
COVER WAND RF STERILE (DRAPES) IMPLANT
DRAPE MICROSCOPE LEICA 46X105 (MISCELLANEOUS) ×2 IMPLANT
DRAPE OPHTHALMIC 77X100 STRL (CUSTOM PROCEDURE TRAY) ×2 IMPLANT
ERASER HMR WETFIELD 23G BP (MISCELLANEOUS) IMPLANT
FILTER BLUE MILLIPORE (MISCELLANEOUS) IMPLANT
FILTER STRAW FLUID ASPIR (MISCELLANEOUS) IMPLANT
FORCEPS GRIESHABER ILM 25G A (INSTRUMENTS) ×2 IMPLANT
GAS AUTO FILL CONSTEL (OPHTHALMIC)
GAS AUTO FILL CONSTELLATION (OPHTHALMIC) IMPLANT
GLOVE BIO SURGEON STRL SZ7.5 (GLOVE) ×4 IMPLANT
GLOVE BIOGEL M 7.0 STRL (GLOVE) ×2 IMPLANT
GOWN STRL REUS W/ TWL LRG LVL3 (GOWN DISPOSABLE) ×2 IMPLANT
GOWN STRL REUS W/ TWL XL LVL3 (GOWN DISPOSABLE) ×1 IMPLANT
GOWN STRL REUS W/TWL LRG LVL3 (GOWN DISPOSABLE) ×2
GOWN STRL REUS W/TWL XL LVL3 (GOWN DISPOSABLE) ×1
KIT BASIN OR (CUSTOM PROCEDURE TRAY) ×2 IMPLANT
KIT PERFLUORON PROCEDURE 5ML (MISCELLANEOUS) ×2 IMPLANT
LENS MACULAR ASPHERIC CONSTEL (OPHTHALMIC) IMPLANT
LENS VITRECTOMY FLAT OCLR DISP (MISCELLANEOUS) IMPLANT
LOOP FINESSE 25 GA (MISCELLANEOUS) ×2 IMPLANT
MICROPICK 25G (MISCELLANEOUS)
NEEDLE 18GX1X1/2 (RX/OR ONLY) (NEEDLE) ×2 IMPLANT
NEEDLE 25GX 5/8IN NON SAFETY (NEEDLE) ×6 IMPLANT
NEEDLE HYPO 30X.5 LL (NEEDLE) ×4 IMPLANT
NEEDLE PRECISIONGLIDE 27X1.5 (NEEDLE) IMPLANT
NS IRRIG 1000ML POUR BTL (IV SOLUTION) ×2 IMPLANT
OIL SILICONE OPHTHALMIC 1000 (Ophthalmic Related) ×2 IMPLANT
PACK VITRECTOMY CUSTOM (CUSTOM PROCEDURE TRAY) ×2 IMPLANT
PAD ARMBOARD 7.5X6 YLW CONV (MISCELLANEOUS) ×4 IMPLANT
PAK PIK VITRECTOMY CVS 25GA (OPHTHALMIC) ×2 IMPLANT
PENCIL BIPOLAR 25GA STR DISP (OPHTHALMIC RELATED) ×2 IMPLANT
PICK MICROPICK 25G (MISCELLANEOUS) IMPLANT
PROBE DIATHERMY DSP 27GA (MISCELLANEOUS) IMPLANT
PROBE ENDO DIATHERMY 25G (MISCELLANEOUS) ×2 IMPLANT
PROBE LASER ILLUM FLEX CVD 25G (OPHTHALMIC) ×2 IMPLANT
REPL STRA BRUSH NEEDLE (NEEDLE) IMPLANT
RESERVOIR BACK FLUSH (MISCELLANEOUS) IMPLANT
RETRACTOR IRIS FLEX 25G GRIESH (INSTRUMENTS) IMPLANT
SET INJECTOR OIL FLUID CONSTEL (OPHTHALMIC) ×2 IMPLANT
SPONGE SURGIFOAM ABS GEL 12-7 (HEMOSTASIS) IMPLANT
STOPCOCK 4 WAY LG BORE MALE ST (IV SETS) IMPLANT
SUT VICRYL 7 0 TG140 8 (SUTURE) ×2 IMPLANT
SYR 10ML LL (SYRINGE) ×2 IMPLANT
SYR 20CC LL (SYRINGE) ×2 IMPLANT
SYR 5ML LL (SYRINGE) ×2 IMPLANT
SYR BULB 3OZ (MISCELLANEOUS) ×2 IMPLANT
SYR TB 1ML LUER SLIP (SYRINGE) ×4 IMPLANT
TOWEL NATURAL 6PK STERILE (DISPOSABLE) ×2 IMPLANT
TUBING HIGH PRESS EXTEN 6IN (TUBING) ×2 IMPLANT
WATER STERILE IRR 1000ML POUR (IV SOLUTION) ×2 IMPLANT

## 2018-12-06 NOTE — Op Note (Signed)
Date of Surgery:1.22.2020  Pre-Op Diagnosis: Tractional Retinal Detachment, LEFTEye  Post-op Diagnosis:Tractional Retinal Detachment, LEFT Eye  Procedure: 1.25gauge [place] Vitrectomy  CPT [id number] 2. PVR Membrane Peeling 3. Perflurocarbon 4. Retinotomy 5.Fluid-AirExchange 6. Laser Retinopexy 7. Injection 1000cssiliconoil, LEFT Eye  Surgeon:Londynn Sonoda Wynona NeatG. Danyela Posas, MD, PhD  Assistant:Amanda Manson PasseyBrown, OA  Anesthesia:General  Estimated Blood Loss <1cc  Complications: None  Brief history: The patient has a history ofa rhegmatogenous retinal detachment, left eye, and is s/p retinal detachment repair by scleral buckle + PPV OS on 08/21/18. The patient was doing well, but at most recent post operative f/u, was found to have a PVR tractional detachment OS.The risks, benefits, and alternatives were explained to the patient, including pain, bleeding, infection, loss of vision, double vision, droopy eyelids, and need for more surgeries.Informed consent was obtained from the patient and placed in the chart.   Procedure: Patient was seen in the Pre-operative area. After all remaining questions were answered, the operative "LeftEye" was marked and the informed consent was confirmed.The patient was brought back to the operating room by the nursing staffwhere general endotracheal anesthesia was induced.Adocumentedtime-out was performed. All in attendance (the nursing staff, anesthesia staff, and ophthalmology staff) agreed upon the patient, type of surgery, location of surgery, and patient allergies.Theoperative eye wasthenprepped and draped inthe usualsterileophthalmicfashion, followed by placement of a lid speculum.  A 25gauge trocar was placed in the inferotemporal quadrant in a beveled fashion, taking care to avoid the prior sclerotomy site.The infusion line was brought to the operative field and found to be functional and free of air bubbles.The infusion  linewas placed through this trocar and the infusion cannula was confirmed in the vitreous cavity with no incarceration of retina or choroid prior to turning it on. The infusion was secured with steristrips.Two additional 25gauge trocars were placed in the superonasal and superotemporal quadrants(10and 2oclock, respectively)in a similar beveled fashion.  The BIOMviewing systemwas then brought into placeand theretina was visualized.The temporal retina was detached from 3-430 oclock with PVR membranes posterior to the previously placed scleral buckle. Additionally there were subertinal bands emanating from a yellow subretinal blood clot. Kenalog and ICG were used to stain membranes. Peripheral PVR membranes were peeledusing the wide-field viewing system.  These PVR membranes were very carefully stripped and peeled from the retinal surface using the light pipe, ILM and maxgrip forceps, and 25g finesse loop with limited success. The PVR membranes were very gummy and ingrained into the detached retina.  Perflurocarbon was used to push the subretinal fluid anteriorly and flatten thedetached temporal retina. Due to the extensive intrinsic PVR and the presence of a subretinal blood clot with subretinal bands, diathermy was used to mark a small relaxing retinotomy from3-430oclock. Additional perfluorocarbon was used to flatten the retina after creation of the retinectomy. Using the lighted endolaser, near confluent laser was applied to the border of the retinectomy.Air-fluid exchange was performed with drainage of all subretinal fluidusing the soft tip cannula. Under air additional laser was placed to the attached retina, posterior to the scleral buckle.ABSS wash was performedto rinse any residual Perfluoron, which was again carefully aspirated using the soft tip cannula.1000cs siliconoil was then injected via the superior nasal trocar and filled up to the level of the lens plane with the aide  of the venting cannula.  The three trocars were removedand sutured closed with7-0 vicryl sutures. The eye was confirmed to be at a physiologic level by digital palpation. Subconjunctival injections ofantibiotic and kenalogwere administered. The lid speculum and drapes were removed.  Drops of an antibiotic, anti-ocular hypertensives andsteroid were given. The eye was patched and shielded. The patient tolerated the procedure well without any intraoperative or immediate postoperative complications. The patient was taken to the recovery room in good conditionto be discharged home. The patient was instructed to maintain a strict face-down position andwill be seen by Dr. Johnnette LitterZamoratomorrowmorning.

## 2018-12-06 NOTE — Brief Op Note (Signed)
12/06/2018  5:16 PM  PATIENT:  Carla May  78 y.o. female  PRE-OPERATIVE DIAGNOSIS:   retinal detachment left eye  POST-OPERATIVE DIAGNOSIS:   retinal detachment left eye  PROCEDURE:  Procedure(s): REPAIR OF COMPLEX TRACTION RETINAL DETACHMENT, 25 gauge vitrectomy, endolaser photocoagulation, memebrane peel, perfluoron injection,  and silicone oil (Left)  SURGEON:  Surgeon(s) and Role:    Rennis Chris, MD - Primary  ASSISTANTS: Laurian Brim, OA   ANESTHESIA:   general  EBL:  5 mL   BLOOD ADMINISTERED:none  DRAINS: none   LOCAL MEDICATIONS USED:  NONE  SPECIMEN:  No Specimen  DISPOSITION OF SPECIMEN:  N/A  COUNTS:  YES  TOURNIQUET:  * No tourniquets in log *  DICTATION: .Note written in EPIC  PLAN OF CARE: Discharge to home after PACU  PATIENT DISPOSITION:  PACU - hemodynamically stable.   Delay start of Pharmacological VTE agent (>24hrs) due to surgical blood loss or risk of bleeding: not applicable

## 2018-12-06 NOTE — Anesthesia Preprocedure Evaluation (Signed)
Anesthesia Evaluation  Patient identified by MRN, date of birth, ID band Patient awake    Reviewed: Allergy & Precautions, NPO status , Patient's Chart, lab work & pertinent test results  History of Anesthesia Complications (+) PONVNegative for: history of anesthetic complications  Airway Mallampati: II  TM Distance: >3 FB Neck ROM: Full    Dental no notable dental hx. (+) Teeth Intact   Pulmonary neg pulmonary ROS, former smoker,    Pulmonary exam normal        Cardiovascular negative cardio ROS Normal cardiovascular exam     Neuro/Psych negative neurological ROS  negative psych ROS   GI/Hepatic Neg liver ROS, GERD  ,  Endo/Other  negative endocrine ROS  Renal/GU negative Renal ROS  negative genitourinary   Musculoskeletal negative musculoskeletal ROS (+)   Abdominal   Peds  Hematology negative hematology ROS (+)   Anesthesia Other Findings   Reproductive/Obstetrics                             Anesthesia Physical Anesthesia Plan  ASA: II  Anesthesia Plan: General   Post-op Pain Management:    Induction: Intravenous  PONV Risk Score and Plan: 4 or greater and Ondansetron, Dexamethasone, TIVA, Propofol infusion and Treatment may vary due to age or medical condition  Airway Management Planned: Oral ETT  Additional Equipment: None  Intra-op Plan:   Post-operative Plan: Extubation in OR  Informed Consent: I have reviewed the patients History and Physical, chart, labs and discussed the procedure including the risks, benefits and alternatives for the proposed anesthesia with the patient or authorized representative who has indicated his/her understanding and acceptance.     Dental advisory given  Plan Discussed with:   Anesthesia Plan Comments:         Anesthesia Quick Evaluation

## 2018-12-06 NOTE — Anesthesia Procedure Notes (Signed)
Procedure Name: Intubation Date/Time: 12/06/2018 3:17 PM Performed by: Elayne Snare, CRNA Pre-anesthesia Checklist: Patient identified, Emergency Drugs available, Suction available and Patient being monitored Patient Re-evaluated:Patient Re-evaluated prior to induction Oxygen Delivery Method: Circle System Utilized Preoxygenation: Pre-oxygenation with 100% oxygen Induction Type: IV induction Laryngoscope Size: Mac and 3 Grade View: Grade II Tube type: Oral Tube size: 7.0 mm Number of attempts: 1 Airway Equipment and Method: Stylet and Oral airway Placement Confirmation: ETT inserted through vocal cords under direct vision,  positive ETCO2 and breath sounds checked- equal and bilateral Secured at: 22 cm Tube secured with: Tape Dental Injury: Teeth and Oropharynx as per pre-operative assessment

## 2018-12-06 NOTE — Progress Notes (Signed)
[facility] - Clinic Note  12/07/2018     CHIEF COMPLAINT Patient presents for Post-op Follow-up   HISTORY OF PRESENT ILLNESS: Carla May is a 78 y.o. female who presents to the clinic today for:   HPI    Post-op Follow-up    In left eye.  Discomfort includes pain and foreign body sensation.  Negative for itching, tearing, discharge, floaters and none.  Vision is blurred at distance and is blurred at near.  I, the attending physician,  performed the HPI with the patient and updated documentation appropriately.          Comments    Patient states some discomfort OS this am-helped with tylenol.       Last edited by Rennis ChrisZamora, Embry Huss, MD on 12/10/2018  1:53 AM. (History)    pt states last night went well, she states she only had to use tylenol for pain   Referring physician: Maurice SmallGriffin, Elaine, MD [address] [place], [place] [id number]  HISTORICAL INFORMATION:   Selected notes from the MEDICAL RECORD NUMBER Referred by Dr. Ashok CordiaMaria Johnson for concern of retinal hole LEE: Josefa Half(M. Johnson) [BCVA: OD: OS:] Ocular Hx- PMH-anxiety, arthritis, HLD    CURRENT MEDICATIONS: Current Outpatient Medications (Ophthalmic Drugs)  Medication Sig  . carboxymethylcellulose (REFRESH PLUS) 0.5 % SOLN Place 1 drop into both eyes 3 (three) times daily as needed (dry eyes).   . dorzolamide-timolol (COSOPT) 22.3-6.8 MG/ML ophthalmic solution Place 1 drop into the left eye daily.   . bacitracin-polymyxin b (POLYSPORIN) ophthalmic ointment Place 1 application into the left eye 4 (four) times daily. Apply 1/4 inch ribbon to left eye four times a day (Patient not taking: Reported on 11/16/2018)  . gatifloxacin (ZYMAXID) 0.5 % SOLN Place 1 drop into the left eye 4 (four) times daily. (Patient not taking: Reported on 11/16/2018)  . prednisoLONE acetate (PRED FORTE) 1 % ophthalmic suspension Place 1 drop into the left eye 3 (three) times daily. (Patient not taking: Reported on  12/07/2018)   No current facility-administered medications for this visit.  (Ophthalmic Drugs)   Current Outpatient Medications (Other)  Medication Sig  . acetaminophen (TYLENOL ARTHRITIS PAIN) 650 MG CR tablet Take 1,300 mg by mouth every 8 (eight) hours as needed for pain.  . Multiple Vitamin (MULTIVITAMIN WITH MINERALS) TABS tablet Take 1 tablet by mouth daily. One-A-Day Multivitamin  . Omega-3 Fatty Acids (FISH OIL) 1000 MG CAPS Take 1,000 mg by mouth daily.  Marland Kitchen. omeprazole (PRILOSEC OTC) 20 MG tablet Take 20 mg by mouth daily.  Marland Kitchen. aspirin EC 81 MG tablet Take 81 mg by mouth daily.  Marland Kitchen. HYDROcodone-acetaminophen (NORCO/VICODIN) 5-325 MG tablet Take 1 tablet by mouth every 4 (four) hours as needed for moderate pain. (Patient not taking: Reported on 12/05/2018)   No current facility-administered medications for this visit.  (Other)      REVIEW OF SYSTEMS: ROS    Positive for: Eyes   Negative for: Constitutional, Gastrointestinal, Neurological, Skin, Genitourinary, Musculoskeletal, HENT, Endocrine, Cardiovascular, Respiratory, Psychiatric, Allergic/Imm, Heme/Lymph   Last edited by Annalee GentaBarber, Daryl D on 12/07/2018  8:00 AM. (History)       ALLERGIES Allergies  Allergen Reactions  . Atorvastatin Other (See Comments)    Muscle aches/ memory loss  . Penicillins Itching and Nausea Only  . Sulfa Antibiotics Nausea Only    PAST MEDICAL HISTORY Past Medical History:  Diagnosis Date  . Anxiety   . Arthritis   . GERD (gastroesophageal reflux disease)   .  History of palpitations   . Hyperlipidemia   . MVP (mitral valve prolapse)   . PONV (postoperative nausea and vomiting)    No problems in 08/2018; however has had previous problems  . Pre-diabetes   . Retinal detachment    OS   Past Surgical History:  Procedure Laterality Date  . ABDOMINAL HYSTERECTOMY  1970's  . CATARACT EXTRACTION    . EYE SURGERY Bilateral   . GAS INSERTION Right 11/26/2014   Procedure: INSERTION OF GAS;   Surgeon: Sherrie George, MD;  Location: [facility] OR;  Service: Ophthalmology;  Laterality: Right;  C3F8  . GAS/FLUID EXCHANGE Left 08/21/2018   Procedure: GAS/FLUID EXCHANGE;  Surgeon: Rennis Chris, MD;  Location: [facility] OR;  Service: Ophthalmology;  Laterality: Left;  C3F8  . KNEE ARTHROSCOPY Left   . PHOTOCOAGULATION WITH LASER Right 11/26/2014   Procedure: PHOTOCOAGULATION WITH LASER;  Surgeon: Sherrie George, MD;  Location: [facility] OR;  Service: Ophthalmology;  Laterality: Right;  . PHOTOCOAGULATION WITH LASER Left 08/21/2018   Procedure: PHOTOCOAGULATION WITH LASER;  Surgeon: Rennis Chris, MD;  Location: [facility] OR;  Service: Ophthalmology;  Laterality: Left;  . REPAIR OF COMPLEX TRACTION RETINAL DETACHMENT Left 12/06/2018   Procedure: REPAIR OF COMPLEX TRACTION RETINAL DETACHMENT, 25 gauge vitrectomy, endolaser photocoagulation, memebrane peel, perfluoron injection,  and silicone oil;  Surgeon: Rennis Chris, MD;  Location: [facility] OR;  Service: Ophthalmology;  Laterality: Left;  . RETINAL DETACHMENT SURGERY Left 08/21/2018   SBP - Dr. Sherryl Manges  . SCLERAL BUCKLE Right 11/26/2014   Procedure: SCLERAL BUCKLE RIGHT EYE ;  Surgeon: Sherrie George, MD;  Location: [facility] OR;  Service: Ophthalmology;  Laterality: Right;  . SCLERAL BUCKLE WITH POSSIBLE 25 GAUGE PARS PLANA VITRECTOMY Left 08/21/2018   Procedure: SCLERAL BUCKLE WITH 25 GAUGE PARS PLANA VITRECTOMY;  Surgeon: Rennis Chris, MD;  Location: [facility] OR;  Service: Ophthalmology;  Laterality: Left;    FAMILY HISTORY Family History  Problem Relation Age of Onset  . Cancer Mother   . Heart failure Father     SOCIAL HISTORY Social History   Tobacco Use  . Smoking status: Former Smoker    Years: 10.00  . Smokeless tobacco: Never Used  . Tobacco comment: quit in 1970's  Substance Use Topics  . Alcohol use: No  . Drug use: No         OPHTHALMIC EXAM:  Base Eye Exam    Visual Acuity (Snellen - Linear)      Right Left   Dist [place] 20/60 +2 CF at 2'   Dist  ph [place] 20/30 -2 20/80 -2       Tonometry (Tonopen, 8:08 AM)      Right Left   Pressure 13 20       Pupils      Dark Light Shape React APD   Right 4 3 Round Slow None   Left 8 8 Round Minimal None       Visual Fields (Counting fingers)      Left Right     Full   Restrictions Partial outer superior nasal deficiency        Extraocular Movement      Right Left    Full, Ortho Full, Ortho       Neuro/Psych    Oriented x3:  Yes   Mood/Affect:  Normal       Dilation    Left eye:  1.0% Mydriacyl, 2.5% Phenylephrine @ 8:08 AM  Slit Lamp and Fundus Exam    External Exam      Right Left   External  Periorbital edema and ecchymoses -- improved       Slit Lamp Exam      Right Left   Lids/Lashes Mild Meibomian gland dysfunction, Telangiectasia Dermatochalasis - upper lid, Meibomian gland dysfunction, Telangiectasia, trace Ecchymosis nasal upper lid   Conjunctiva/Sclera post surgical changes, healed nicely 2+ Injection, sutures intact   Cornea 2+ Punctate epithelial erosions, Temporal Well healed cataract wounds 1+ inferior Punctate epithelial erosions   Anterior Chamber Deep and quiet deep 1/2+cell/pigment   Iris Round and dilated Round and dilated   Lens PC IOL in good position, trace PCO PC IOL in good position, trace Posterior capsular opacification   Vitreous Vitreous syneresis, mild pigment in anterior vitreous, Posterior vitreous detachment Post vitrectomy, silicon oil -- good fill       Fundus Exam      Right Left   Disc Pink and Sharp Compact   C/D Ratio 0.2 0.2   Macula Blunted foveal reflex, Retinal pigment epithelial mottling, No heme or edema Flat under oil; Blunted foveal reflex   Vessels Vascular attenuation Vascular attenuation   Periphery Attached over scleral buckle, good buckle height, good scarring over buckle good buckle height; good laser surrounding buckle; improved fibrosis; retina attached under oil, retinotomy from 0300-0430 with good laser  surrounding          IMAGING AND PROCEDURES  Imaging and Procedures for @TODAY @           ASSESSMENT/PLAN:    ICD-10-CM   1. 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 Retinal edema H35.81   4. History of retinal detachment Z86.69   5. Pseudophakia of both eyes Z96.1     1-3. Retinal detachment, OS - originally: macula-sparing inferior retinal detachment from 3-6 oclock - large HST at 0500 and 2 small tears at 300 within the detached retina - s/p SBP + PPV/PFC/EL/FAX/14% C3F8 OS, 10.07.19  - progressive fibrosis/PVR just posterior to buckle around 0400 with +SRF tracking posterior to buckle -- focal tractional/PVR detachment OS - now s/p PPV/MP/PFO/retinotomy/EL/1000cs silicon oil OS, 1.22.2020             - doing well this morning             - retina attached and in good position -- good buckle height and laser around retinotomy             - IOP mildly elevated             - start   PF 4x/day OS                         besivance QID OS                         Atropine BID OS                         Cosopt BID OS                         PSO ung QID OS             - cont face down positioning x3 days; avoid laying flat on back             -  eye shield when sleeping             - post op drop and positioning instructions reviewed             - tylenol/ibuprofen for pain  - f/u 1 week  4. History of retinal detachment OD - S/P SBP OD 11/26/14 -- Dr. Alan Mulder - looks great -- retina in great position - monitor  5. Pseudophakia OU  - s/p CE/IOL OU ([place])  - doing well  - monitor   Ophthalmic Meds Ordered this visit:  No orders of the defined types were placed in this encounter.      Return in about 1 week (around 12/14/2018) for f/u RD OS.  There are no Patient Instructions on file for this visit.   Explained the diagnoses, plan, and follow up with the patient and they expressed understanding.  Patient  expressed understanding of the importance of proper follow up care.   This document serves as a record of services personally performed by Karie Chimera, MD, PhD. It was created on their behalf by Laurian Brim, OA, an ophthalmic assistant. The creation of this record is the provider's dictation and/or activities during the visit.    Electronically signed by: Laurian Brim, OA  01.22.2020 1:59 AM     Karie Chimera, M.D., Ph.D. Diseases & Surgery of the Retina and Vitreous [facility]   I have reviewed the above documentation for accuracy and completeness, and I agree with the above. Karie Chimera, M.D., Ph.D. 12/10/18 1:59 AM     Abbreviations: M myopia (nearsighted); A astigmatism; H hyperopia (farsighted); P presbyopia; Mrx spectacle prescription;  CTL contact lenses; OD right eye; OS left eye; OU both eyes  XT exotropia; ET esotropia; PEK punctate epithelial keratitis; PEE punctate epithelial erosions; DES dry eye syndrome; MGD meibomian gland dysfunction; ATs artificial tears; PFAT's preservative free artificial tears; NSC nuclear sclerotic cataract; PSC posterior subcapsular cataract; ERM epi-retinal membrane; PVD posterior vitreous detachment; RD retinal detachment; DM diabetes mellitus; DR diabetic retinopathy; NPDR non-proliferative diabetic retinopathy; PDR proliferative diabetic retinopathy; CSME clinically significant macular edema; DME diabetic macular edema; dbh dot blot hemorrhages; CWS cotton wool spot; POAG primary open angle glaucoma; C/D cup-to-disc ratio; HVF humphrey visual field; GVF goldmann visual field; OCT optical coherence tomography; IOP intraocular pressure; BRVO Branch retinal vein occlusion; CRVO central retinal vein occlusion; CRAO central retinal artery occlusion; BRAO branch retinal artery occlusion; RT retinal tear; SB scleral buckle; PPV pars plana vitrectomy; VH Vitreous hemorrhage; PRP panretinal laser photocoagulation; IVK  intravitreal kenalog; VMT vitreomacular traction; MH Macular hole;  NVD neovascularization of the disc; NVE neovascularization elsewhere; AREDS age related eye disease study; ARMD age related macular degeneration; POAG primary open angle glaucoma; EBMD epithelial/anterior basement membrane dystrophy; ACIOL anterior chamber intraocular lens; IOL intraocular lens; PCIOL posterior chamber intraocular lens; Phaco/IOL phacoemulsification with intraocular lens placement; PRK photorefractive keratectomy; LASIK laser assisted in situ keratomileusis; HTN hypertension; DM diabetes mellitus; COPD chronic obstructive pulmonary disease

## 2018-12-06 NOTE — Transfer of Care (Signed)
Immediate Anesthesia Transfer of Care Note  Patient: KIARNA HEGGER  Procedure(s) Performed: REPAIR OF COMPLEX TRACTION RETINAL DETACHMENT, 25 gauge vitrectomy, endolaser photocoagulation, memebrane peel, perfluoron injection,  and silicone oil (Left Eye)  Patient Location: PACU  Anesthesia Type:General  Level of Consciousness: awake, alert  and patient cooperative  Airway & Oxygen Therapy: Patient Spontanous Breathing and Patient connected to nasal cannula oxygen  Post-op Assessment: Report given to RN and Post -op Vital signs reviewed and stable  Post vital signs: Reviewed and stable  Last Vitals:  Vitals Value Taken Time  BP 154/77 12/06/2018  5:12 PM  Temp 36.1 C 12/06/2018  5:12 PM  Pulse 95 12/06/2018  5:13 PM  Resp 16 12/06/2018  5:13 PM  SpO2 97 % 12/06/2018  5:13 PM  Vitals shown include unvalidated device data.  Last Pain:  Vitals:   12/06/18 1306  TempSrc: Oral  PainSc:       Patients Stated Pain Goal: 7 (12/06/18 1256)  Complications: No apparent anesthesia complications

## 2018-12-06 NOTE — Discharge Instructions (Addendum)
POSTOPERATIVE INSTRUCTIONS  Your doctor has performed vitreoretinal surgery on you at [facility].  - Keep eye patched and shielded until seen by Dr. Zamora 8 AM tomorrow in clinic - Do not use drops until return - FACE DOWN POSITIONING WHILE AWAKE - Sleep with belly down or on right side, avoid laying flat on back.    - No strenuous bending, stooping or lifting.  - You may not drive until further notice.  - If your doctor used a gas bubble in your eye during the procedure he will advise you on postoperative positioning. If you have a gas bubble you will be wearing a green bracelet that was applied in the operating room. The green bracelet should stay on as long as the gas bubble is in your eye. While the gas bubble is present you should not fly in an airplane. If you require general anesthesia while the gas bubble is present you must notify your anesthesiologist that an intraocular gas bubble is present so he can take the appropriate precautions.  - Tylenol or any other over-the-counter pain reliever can be used according to your doctor. If more pain medicine is required, your doctor will have a prescription for you.  - You may read, go up and down stairs, and watch television.     Brian Zamora, M.D., Ph.D.  

## 2018-12-06 NOTE — Anesthesia Postprocedure Evaluation (Signed)
Anesthesia Post Note  Patient: Carla May  Procedure(s) Performed: REPAIR OF COMPLEX TRACTION RETINAL DETACHMENT, 25 gauge vitrectomy, endolaser photocoagulation, memebrane peel, perfluoron injection,  and silicone oil (Left Eye)     Patient location during evaluation: PACU Anesthesia Type: General Level of consciousness: awake and alert Pain management: pain level controlled Vital Signs Assessment: post-procedure vital signs reviewed and stable Respiratory status: spontaneous breathing, nonlabored ventilation, respiratory function stable and patient connected to nasal cannula oxygen Cardiovascular status: blood pressure returned to baseline and stable Postop Assessment: no apparent nausea or vomiting Anesthetic complications: no    Last Vitals:  Vitals:   12/06/18 1738 12/06/18 1750  BP: (!) 149/76 (!) 144/75  Pulse: 90 89  Resp: 15 12  Temp: (!) 36.1 C   SpO2: 95% 96%    Last Pain:  Vitals:   12/06/18 1712  TempSrc:   PainSc: 0-No pain                 Shelton SilvasKevin D Kasidy Gianino

## 2018-12-06 NOTE — Interval H&P Note (Signed)
History and Physical Interval Note:  12/06/2018 3:01 PM  Carla May  has presented today for surgery, with the diagnosis of retinal detachement left eye  The various methods of treatment have been discussed with the patient and family. After consideration of risks, benefits and other options for treatment, the patient has consented to  Procedure(s): REPAIR OF COMPLEX TRACTION RETINAL DETACHMENT (Left) as a surgical intervention .  The patient's history has been reviewed, patient examined, no change in status, stable for surgery.  I have reviewed the patient's chart and labs.  Questions were answered to the patient's satisfaction.     Rennis Chris

## 2018-12-07 ENCOUNTER — Ambulatory Visit (INDEPENDENT_AMBULATORY_CARE_PROVIDER_SITE_OTHER): Payer: Medicare Other | Admitting: Ophthalmology

## 2018-12-07 ENCOUNTER — Encounter (INDEPENDENT_AMBULATORY_CARE_PROVIDER_SITE_OTHER): Payer: Self-pay | Admitting: Ophthalmology

## 2018-12-07 DIAGNOSIS — Z961 Presence of intraocular lens: Secondary | ICD-10-CM

## 2018-12-07 DIAGNOSIS — Z8669 Personal history of other diseases of the nervous system and sense organs: Secondary | ICD-10-CM

## 2018-12-07 DIAGNOSIS — H3342 Traction detachment of retina, left eye: Secondary | ICD-10-CM

## 2018-12-07 DIAGNOSIS — H3322 Serous retinal detachment, left eye: Secondary | ICD-10-CM

## 2018-12-07 DIAGNOSIS — H3581 Retinal edema: Secondary | ICD-10-CM

## 2018-12-08 ENCOUNTER — Encounter (HOSPITAL_COMMUNITY): Payer: Self-pay | Admitting: Ophthalmology

## 2018-12-10 ENCOUNTER — Encounter (INDEPENDENT_AMBULATORY_CARE_PROVIDER_SITE_OTHER): Payer: Self-pay | Admitting: Ophthalmology

## 2018-12-12 NOTE — Progress Notes (Addendum)
[facility] - Clinic Note  12/15/2018     CHIEF COMPLAINT Patient presents for Post-op Follow-up   HISTORY OF PRESENT ILLNESS: Carla May is a 78 y.o. female who presents to the clinic today for:   HPI    Post-op Follow-up    In left eye.  Discomfort includes pain.  Vision is improved.  I, the attending physician,  performed the HPI with the patient and updated documentation appropriately.          Comments    78 y/o female pt here for 1 wk po s/p PPV/MP/PFO/retinectomy/EL/1000cs silicon oil OS 1.22.20.  Also s/p SBP + PPV/PFC/EL/FAX/14% C3F8 OS on 10.7.19.  Doing well.  VA slightly improved OS.  No change in [place] OD.  Has minor discomfort OD.  Pain at about a 3.  Takes tylenol to help with pain.  Denies flashes, floaters.  PF QID OS Besivance QID OS Atropine BID OS Cosopt BID OS PSO UNG QID OS       Last edited by Rennis Chris, MD on 12/15/2018  8:20 AM. (History)       Referring physician: Maurice Small, MD [address] [place], [place] [address]  HISTORICAL INFORMATION:   Selected notes from the MEDICAL RECORD NUMBER Referred by Dr. Ashok Cordia for concern of retinal hole LEE: Josefa Half) [BCVA: OD: OS:] Ocular Hx- PMH-anxiety, arthritis, HLD    CURRENT MEDICATIONS: Current Outpatient Medications (Ophthalmic Drugs)  Medication Sig  . bacitracin-polymyxin b (POLYSPORIN) ophthalmic ointment Place 1 application into the left eye 4 (four) times daily. Apply 1/4 inch ribbon to left eye four times a day (Patient not taking: Reported on 11/16/2018)  . carboxymethylcellulose (REFRESH PLUS) 0.5 % SOLN Place 1 drop into both eyes 3 (three) times daily as needed (dry eyes).   . dorzolamide-timolol (COSOPT) 22.3-6.8 MG/ML ophthalmic solution Place 1 drop into the left eye daily.  Marland Kitchen gatifloxacin (ZYMAXID) 0.5 % SOLN Place 1 drop into the left eye 4 (four) times daily. (Patient not taking: Reported on 11/16/2018)  . prednisoLONE acetate  (PRED FORTE) 1 % ophthalmic suspension Place 1 drop into the left eye 3 (three) times daily. (Patient not taking: Reported on 12/07/2018)   No current facility-administered medications for this visit.  (Ophthalmic Drugs)   Current Outpatient Medications (Other)  Medication Sig  . acetaminophen (TYLENOL ARTHRITIS PAIN) 650 MG CR tablet Take 1,300 mg by mouth every 8 (eight) hours as needed for pain.  Marland Kitchen aspirin EC 81 MG tablet Take 81 mg by mouth daily.  Marland Kitchen HYDROcodone-acetaminophen (NORCO/VICODIN) 5-325 MG tablet Take 1 tablet by mouth every 4 (four) hours as needed for moderate pain. (Patient not taking: Reported on 12/05/2018)  . Multiple Vitamin (MULTIVITAMIN WITH MINERALS) TABS tablet Take 1 tablet by mouth daily. One-A-Day Multivitamin  . Omega-3 Fatty Acids (FISH OIL) 1000 MG CAPS Take 1,000 mg by mouth daily.  Marland Kitchen omeprazole (PRILOSEC OTC) 20 MG tablet Take 20 mg by mouth daily.   No current facility-administered medications for this visit.  (Other)      REVIEW OF SYSTEMS: ROS    Positive for: Gastrointestinal, Musculoskeletal, Endocrine, Cardiovascular, Eyes, Psychiatric   Negative for: Neurological, Skin, Genitourinary, HENT, Respiratory, Allergic/Imm, Heme/Lymph   Last edited by Celine Mans, COA on 12/15/2018  8:08 AM. (History)       ALLERGIES Allergies  Allergen Reactions  . Atorvastatin Other (See Comments)    Muscle aches/ memory loss  . Penicillins Itching and Nausea  Only  . Sulfa Antibiotics Nausea Only    PAST MEDICAL HISTORY Past Medical History:  Diagnosis Date  . Anxiety   . Arthritis   . GERD (gastroesophageal reflux disease)   . History of palpitations   . Hyperlipidemia   . MVP (mitral valve prolapse)   . PONV (postoperative nausea and vomiting)    No problems in 08/2018; however has had previous problems  . Pre-diabetes   . Retinal detachment    OS   Past Surgical History:  Procedure Laterality Date  . ABDOMINAL HYSTERECTOMY  1970's  .  CATARACT EXTRACTION    . EYE SURGERY Bilateral   . GAS INSERTION Right 11/26/2014   Procedure: INSERTION OF GAS;  Surgeon: Sherrie George, MD;  Location[facility] OR;  Service: Ophthalmology;  Laterality: Right;  C3F8  . GAS/FLUID EXCHANGE Left 08/21/2018   Procedure: GAS/FLUID EXCHANGE;  Surgeon: Rennis Chris, MD;  Location: [facility] OR;  Service: Ophthalmology;  Laterality: Left;  C3F8  . KNEE ARTHROSCOPY Left   . PHOTOCOAGULATION WITH LASER Right 11/26/2014   Procedure: PHOTOCOAGULATION WITH LASER;  Surgeon: Sherrie George, MD;  Location: [facility] OR;  Service: Ophthalmology;  Laterality: Right;  . PHOTOCOAGULATION WITH LASER Left 08/21/2018   Procedure: PHOTOCOAGULATION WITH LASER;  Surgeon: Rennis Chris, MD;  Location: [facility] OR;  Service: Ophthalmology;  Laterality: Left;  . REPAIR OF COMPLEX TRACTION RETINAL DETACHMENT Left 12/06/2018   Procedure: REPAIR OF COMPLEX TRACTION RETINAL DETACHMENT, 25 gauge vitrectomy, endolaser photocoagulation, memebrane peel, perfluoron injection,  and silicone oil;  Surgeon: Rennis Chris, MD;  Location: [facility] OR;  Service: Ophthalmology;  Laterality: Left;  . RETINAL DETACHMENT SURGERY Left 08/21/2018   SBP - Dr. Sherryl Manges  . SCLERAL BUCKLE Right 11/26/2014   Procedure: SCLERAL BUCKLE RIGHT EYE ;  Surgeon: Sherrie George, MD;  Location: [facility] OR;  Service: Ophthalmology;  Laterality: Right;  . SCLERAL BUCKLE WITH POSSIBLE 25 GAUGE PARS PLANA VITRECTOMY Left 08/21/2018   Procedure: SCLERAL BUCKLE WITH 25 GAUGE PARS PLANA VITRECTOMY;  Surgeon: Rennis Chris, MD;  Location: [facility] OR;  Service: Ophthalmology;  Laterality: Left;    FAMILY HISTORY Family History  Problem Relation Age of Onset  . Cancer Mother   . Heart failure Father     SOCIAL HISTORY Social History   Tobacco Use  . Smoking status: Former Smoker    Years: 10.00  . Smokeless tobacco: Never Used  . Tobacco comment: quit in 1970's  Substance Use Topics  . Alcohol use: No  . Drug use: No          OPHTHALMIC EXAM:  Base Eye Exam    Visual Acuity (Snellen - Linear)      Right Left   Dist cc 20/40 20/150 -2   Dist ph cc NI 20/80 -2   Correction:  Glasses       Tonometry (Tonopen, 8:12 AM)      Right Left   Pressure 11 12       Pupils      Dark Light Shape React APD   Right 4 3 Round Brisk None   Left 5 5 Round No None  Pharm dilated OS       Visual Fields (Counting fingers)      Left Right    Full Full       Extraocular Movement      Right Left    Full, Ortho Full, Ortho       Neuro/Psych    Oriented x3:  Yes   Mood/Affect:  Normal       Dilation    Both eyes:  1.0% Mydriacyl, 2.5% Phenylephrine @ 8:12 AM        Slit Lamp and Fundus Exam    External Exam      Right Left   External  Periorbital edema and ecchymoses -- improved       Slit Lamp Exam      Right Left   Lids/Lashes Mild Meibomian gland dysfunction, Telangiectasia Dermatochalasis - upper lid, Meibomian gland dysfunction, Telangiectasia, trace Ecchymosis nasal upper lid   Conjunctiva/Sclera post surgical changes, healed nicely 2+ Injection, sutures intact   Cornea 2+ Punctate epithelial erosions, Temporal Well healed cataract wounds 1+ inferior Punctate epithelial erosions   Anterior Chamber Deep and quiet deep 1/2+cell/pigment   Iris Round and dilated Round and dilated   Lens PC IOL in good position, trace PCO PC IOL in good position, trace Posterior capsular opacification   Vitreous Vitreous syneresis, mild pigment in anterior vitreous, Posterior vitreous detachment Post vitrectomy, silicon oil -- good fill       Fundus Exam      Right Left   Disc Pink and Sharp Compact   C/D Ratio 0.2 0.2   Macula Blunted foveal reflex, Retinal pigment epithelial mottling, No heme or edema Flat under oil; Blunted foveal reflex   Vessels Vascular attenuation Vascular attenuation   Periphery Attached over scleral buckle, good buckle height, good scarring over buckle good buckle height; good laser  surrounding buckle; improved fibrosis; retina attached under oil, retinotomy from 0300-0430 with good laser surrounding; minimal heme          IMAGING AND PROCEDURES  Imaging and Procedures for @TODAY @           ASSESSMENT/PLAN:    ICD-10-CM   1. 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 Retinal edema H35.81 CANCELED: OCT, Retina - OU - Both Eyes  4. History of retinal detachment Z86.69   5. Pseudophakia of both eyes Z96.1     1-3. Retinal detachment, OS - originally: macula-sparing inferior retinal detachment from 3-6 oclock - large HST at 0500 and 2 small tears at 300 within the detached retina - s/p SBP + PPV/PFC/EL/FAX/14% C3F8 OS, 10.07.19  - progressive fibrosis/PVR just posterior to buckle around 0400 with +SRF tracking posterior to buckle -- focal tractional/PVR detachment OS - now s/p PPV/MP/PFO/retinotomy/EL/1000cs silicon oil OS, 1.22.2020             - reporting some dizziness and car sickness caused by left eye vision             - retina attached and in good position -- good buckle height and laser around retinotomy             - IOP good             - cont   PF 4x/day OS                         besivance QID OS-- stop when bottle runs out                         Atropine BID OS-- stop when bottle runs out                         Cosopt BID OS -- reduce  to qdaily                         PSO ung QID OS-- reduce to qhs and prn             - can d/c face down positioning ; avoid laying flat on back             - d/c eye shield when sleeping             - post op drop and positioning instructions reviewed             - tylenol/ibuprofen for pain  - f/u 3 weekw  4. History of retinal detachment OD - S/P SBP OD 11/26/14 -- Dr. Alan MulderJohn Matthews - looks great -- retina in great position - monitor  5. Pseudophakia OU  - s/p CE/IOL OU ([place])  - doing well  - monitor   Ophthalmic Meds Ordered this visit:  Meds  ordered this encounter  Medications  . dorzolamide-timolol (COSOPT) 22.3-6.8 MG/ML ophthalmic solution    Sig: Place 1 drop into the left eye daily.    Dispense:  10 mL    Refill:  5       Return in about 3 weeks (around 01/05/2019) for POV.  There are no Patient Instructions on file for this visit.   Explained the diagnoses, plan, and follow up with the patient and they expressed understanding.  Patient expressed understanding of the importance of proper follow up care.   This document serves as a record of services personally performed by Karie ChimeraBrian G. Elonzo Sopp, MD, PhD. It was created on their behalf by Laurian BrimAmanda Brown, OA, an ophthalmic assistant. The creation of this record is the provider's dictation and/or activities during the visit.    Electronically signed by: Laurian BrimAmanda Brown, OA  01.28.2020 8:35 AM    Karie ChimeraBrian G. Brailyn Delman, M.D., Ph.D. Diseases & Surgery of the Retina and Vitreous [facility]  I have reviewed the above documentation for accuracy and completeness, and I agree with the above. Karie ChimeraBrian G. Raylen Ken, M.D., Ph.D. 12/15/18 8:35 AM    Abbreviations: M myopia (nearsighted); A astigmatism; H hyperopia (farsighted); P presbyopia; Mrx spectacle prescription;  CTL contact lenses; OD right eye; OS left eye; OU both eyes  XT exotropia; ET esotropia; PEK punctate epithelial keratitis; PEE punctate epithelial erosions; DES dry eye syndrome; MGD meibomian gland dysfunction; ATs artificial tears; PFAT's preservative free artificial tears; NSC nuclear sclerotic cataract; PSC posterior subcapsular cataract; ERM epi-retinal membrane; PVD posterior vitreous detachment; RD retinal detachment; DM diabetes mellitus; DR diabetic retinopathy; NPDR non-proliferative diabetic retinopathy; PDR proliferative diabetic retinopathy; CSME clinically significant macular edema; DME diabetic macular edema; dbh dot blot hemorrhages; CWS cotton wool spot; POAG primary open angle glaucoma; C/D  cup-to-disc ratio; HVF humphrey visual field; GVF goldmann visual field; OCT optical coherence tomography; IOP intraocular pressure; BRVO Branch retinal vein occlusion; CRVO central retinal vein occlusion; CRAO central retinal artery occlusion; BRAO branch retinal artery occlusion; RT retinal tear; SB scleral buckle; PPV pars plana vitrectomy; VH Vitreous hemorrhage; PRP panretinal laser photocoagulation; IVK intravitreal kenalog; VMT vitreomacular traction; MH Macular hole;  NVD neovascularization of the disc; NVE neovascularization elsewhere; AREDS age related eye disease study; ARMD age related macular degeneration; POAG primary open angle glaucoma; EBMD epithelial/anterior basement membrane dystrophy; ACIOL anterior chamber intraocular lens; IOL intraocular lens; PCIOL posterior chamber intraocular lens; Phaco/IOL phacoemulsification with intraocular lens placement; PRK photorefractive keratectomy; LASIK laser  assisted in situ keratomileusis; HTN hypertension; DM diabetes mellitus; COPD chronic obstructive pulmonary disease

## 2018-12-15 ENCOUNTER — Encounter (INDEPENDENT_AMBULATORY_CARE_PROVIDER_SITE_OTHER): Payer: Self-pay | Admitting: Ophthalmology

## 2018-12-15 ENCOUNTER — Ambulatory Visit (INDEPENDENT_AMBULATORY_CARE_PROVIDER_SITE_OTHER): Payer: Medicare Other | Admitting: Ophthalmology

## 2018-12-15 DIAGNOSIS — H3342 Traction detachment of retina, left eye: Secondary | ICD-10-CM

## 2018-12-15 DIAGNOSIS — Z961 Presence of intraocular lens: Secondary | ICD-10-CM

## 2018-12-15 DIAGNOSIS — Z8669 Personal history of other diseases of the nervous system and sense organs: Secondary | ICD-10-CM

## 2018-12-15 DIAGNOSIS — H3581 Retinal edema: Secondary | ICD-10-CM

## 2018-12-15 DIAGNOSIS — H3322 Serous retinal detachment, left eye: Secondary | ICD-10-CM

## 2018-12-15 MED ORDER — DORZOLAMIDE HCL-TIMOLOL MAL 2-0.5 % OP SOLN: 1.0000 [drp] | Freq: Every day | OPHTHALMIC | 5 refills | Status: DC

## 2019-01-05 ENCOUNTER — Encounter (INDEPENDENT_AMBULATORY_CARE_PROVIDER_SITE_OTHER): Payer: Self-pay | Admitting: Ophthalmology

## 2019-01-05 ENCOUNTER — Ambulatory Visit (INDEPENDENT_AMBULATORY_CARE_PROVIDER_SITE_OTHER): Payer: Medicare Other | Admitting: Ophthalmology

## 2019-01-05 DIAGNOSIS — H3581 Retinal edema: Secondary | ICD-10-CM | POA: Diagnosis not present

## 2019-01-05 DIAGNOSIS — Z961 Presence of intraocular lens: Secondary | ICD-10-CM

## 2019-01-05 DIAGNOSIS — Z8669 Personal history of other diseases of the nervous system and sense organs: Secondary | ICD-10-CM

## 2019-01-05 DIAGNOSIS — H3342 Traction detachment of retina, left eye: Secondary | ICD-10-CM

## 2019-01-05 DIAGNOSIS — H3322 Serous retinal detachment, left eye: Secondary | ICD-10-CM

## 2019-01-05 MED ORDER — KETOROLAC TROMETHAMINE 0.5 % OP SOLN: 1.0000 [drp] | Freq: Four times a day (QID) | OPHTHALMIC | 0 refills | Status: DC

## 2019-01-05 NOTE — Progress Notes (Signed)
[facility] - Clinic Note  01/05/2019     CHIEF COMPLAINT Patient presents for Post-op Follow-up   HISTORY OF PRESENT ILLNESS: Carla May is a 78 y.o. female who presents to the clinic today for:   HPI    Post-op Follow-up    In left eye.  Discomfort includes Negative for pain, itching, foreign body sensation, tearing, discharge, floaters and none.  Vision is blurred at distance and is blurred at near.  I, the attending physician,  performed the HPI with the patient and updated documentation appropriately.          Comments    Patient states vision still blurred OS. No pain/discomfort.        Last edited by Rennis ChrisZamora, Yitzhak Awan, MD on 01/05/2019  1:45 PM. (History)    pt states her left eye is doing okay   Referring physician: Maurice SmallGriffin, Elaine, MD [address] [place], [place] [address]  HISTORICAL INFORMATION:   Selected notes from the MEDICAL RECORD NUMBER Referred by Dr. Ashok CordiaMaria Johnson for concern of retinal hole LEE: Josefa Half(M. Johnson) [BCVA: OD: OS:] Ocular Hx- PMH-anxiety, arthritis, HLD    CURRENT MEDICATIONS: Current Outpatient Medications (Ophthalmic Drugs)  Medication Sig  . bacitracin-polymyxin b (POLYSPORIN) ophthalmic ointment Place 1 application into the left eye 4 (four) times daily. Apply 1/4 inch ribbon to left eye four times a day (Patient taking differently: Place 1 application into the left eye as needed. Apply 1/4 inch ribbon to left eye four times a day)  . carboxymethylcellulose (REFRESH PLUS) 0.5 % SOLN Place 1 drop into both eyes 3 (three) times daily as needed (dry eyes).   . dorzolamide-timolol (COSOPT) 22.3-6.8 MG/ML ophthalmic solution Place 1 drop into the left eye daily.  . prednisoLONE acetate (PRED FORTE) 1 % ophthalmic suspension Place 1 drop into the left eye 3 (three) times daily. (Patient taking differently: Place 1 drop into the left eye 4 (four) times daily. )  . gatifloxacin (ZYMAXID) 0.5 % SOLN Place 1 drop  into the left eye 4 (four) times daily. (Patient not taking: Reported on 11/16/2018)  . ketorolac (ACULAR) 0.5 % ophthalmic solution Place 1 drop into the left eye 4 (four) times daily.   No current facility-administered medications for this visit.  (Ophthalmic Drugs)   Current Outpatient Medications (Other)  Medication Sig  . acetaminophen (TYLENOL ARTHRITIS PAIN) 650 MG CR tablet Take 1,300 mg by mouth every 8 (eight) hours as needed for pain.  Marland Kitchen. aspirin EC 81 MG tablet Take 81 mg by mouth daily.  Marland Kitchen. HYDROcodone-acetaminophen (NORCO/VICODIN) 5-325 MG tablet Take 1 tablet by mouth every 4 (four) hours as needed for moderate pain.  . Multiple Vitamin (MULTIVITAMIN WITH MINERALS) TABS tablet Take 1 tablet by mouth daily. One-A-Day Multivitamin  . Omega-3 Fatty Acids (FISH OIL) 1000 MG CAPS Take 1,000 mg by mouth daily.  Marland Kitchen. omeprazole (PRILOSEC OTC) 20 MG tablet Take 20 mg by mouth daily.   No current facility-administered medications for this visit.  (Other)      REVIEW OF SYSTEMS: ROS    Positive for: Gastrointestinal, Musculoskeletal, Cardiovascular, Eyes, Psychiatric   Negative for: Neurological, Skin, Genitourinary, HENT, Endocrine, Respiratory, Allergic/Imm, Heme/Lymph   Last edited by Annalee GentaBarber, Daryl D on 01/05/2019  1:29 PM. (History)       ALLERGIES Allergies  Allergen Reactions  . Atorvastatin Other (See Comments)    Muscle aches/ memory loss  . Penicillins Itching and Nausea Only  . Sulfa Antibiotics Nausea  Only    PAST MEDICAL HISTORY Past Medical History:  Diagnosis Date  . Anxiety   . Arthritis   . GERD (gastroesophageal reflux disease)   . History of palpitations   . Hyperlipidemia   . MVP (mitral valve prolapse)   . PONV (postoperative nausea and vomiting)    No problems in 08/2018; however has had previous problems  . Pre-diabetes   . Retinal detachment    OS   Past Surgical History:  Procedure Laterality Date  . ABDOMINAL HYSTERECTOMY  1970's  .  CATARACT EXTRACTION    . EYE SURGERY Bilateral   . GAS INSERTION Right 11/26/2014   Procedure: INSERTION OF GAS;  Surgeon: Sherrie George, MD;  Location: [facility] OR;  Service: Ophthalmology;  Laterality: Right;  C3F8  . GAS/FLUID EXCHANGE Left 08/21/2018   Procedure: GAS/FLUID EXCHANGE;  Surgeon: Rennis Chris, MD;  Location: [facility] OR;  Service: Ophthalmology;  Laterality: Left;  C3F8  . KNEE ARTHROSCOPY Left   . PHOTOCOAGULATION WITH LASER Right 11/26/2014   Procedure: PHOTOCOAGULATION WITH LASER;  Surgeon: Sherrie George, MD;  Location: [facility] OR;  Service: Ophthalmology;  Laterality: Right;  . PHOTOCOAGULATION WITH LASER Left 08/21/2018   Procedure: PHOTOCOAGULATION WITH LASER;  Surgeon: Rennis Chris, MD;  Location[facility] OR;  Service: Ophthalmology;  Laterality: Left;  . REPAIR OF COMPLEX TRACTION RETINAL DETACHMENT Left 12/06/2018   Procedure: REPAIR OF COMPLEX TRACTION RETINAL DETACHMENT, 25 gauge vitrectomy, endolaser photocoagulation, memebrane peel, perfluoron injection,  and silicone oil;  Surgeon: Rennis Chris, MD;  Location: [facility] OR;  Service: Ophthalmology;  Laterality: Left;  . RETINAL DETACHMENT SURGERY Left 08/21/2018   SBP - Dr. Sherryl Manges  . SCLERAL BUCKLE Right 11/26/2014   Procedure: SCLERAL BUCKLE RIGHT EYE ;  Surgeon: Sherrie George, MD;  Location: [facility] OR;  Service: Ophthalmology;  Laterality: Right;  . SCLERAL BUCKLE WITH POSSIBLE 25 GAUGE PARS PLANA VITRECTOMY Left 08/21/2018   Procedure: SCLERAL BUCKLE WITH 25 GAUGE PARS PLANA VITRECTOMY;  Surgeon: Rennis Chris, MD;  Location: [facility] OR;  Service: Ophthalmology;  Laterality: Left;    FAMILY HISTORY Family History  Problem Relation Age of Onset  . Cancer Mother   . Heart failure Father     SOCIAL HISTORY Social History   Tobacco Use  . Smoking status: Former Smoker    Years: 10.00  . Smokeless tobacco: Never Used  . Tobacco comment: quit in 1970's  Substance Use Topics  . Alcohol use: No  . Drug use: No          OPHTHALMIC EXAM:  Base Eye Exam    Visual Acuity (Snellen - Linear)      Right Left   Dist cc 20/40 20/200   Dist ph cc 20/40 +2 20/60 +1   Correction:  Glasses       Tonometry (Tonopen, 1:38 PM)      Right Left   Pressure 14 14       Pupils      Dark Light Shape React APD   Right 4 3 Round Slow None   Left 7 6.5 Round Slow None       Visual Fields (Counting fingers)      Left Right    Full Full       Extraocular Movement      Right Left    Full, Ortho Full, Ortho       Neuro/Psych    Oriented x3:  Yes   Mood/Affect:  Normal  Slit Lamp and Fundus Exam    External Exam      Right Left   External  Periorbital edema and ecchymoses -- improved       Slit Lamp Exam      Right Left   Lids/Lashes Mild Meibomian gland dysfunction, Telangiectasia Dermatochalasis - upper lid, Meibomian gland dysfunction, Telangiectasia, trace Ecchymosis nasal upper lid   Conjunctiva/Sclera post surgical changes, healed nicely sutures intact   Cornea 2+ Punctate epithelial erosions, Temporal Well healed cataract wounds 1+ inferior Punctate epithelial erosions   Anterior Chamber Deep and quiet deep and clear   Iris Round and dilated Round and dilated   Lens PC IOL in good position, trace PCO PC IOL in good position, trace Posterior capsular opacification   Vitreous Vitreous syneresis, mild pigment in anterior vitreous, Posterior vitreous detachment Post vitrectomy, silicon oil -- good fill       Fundus Exam      Right Left   Disc  Pink and Sharp   C/D Ratio  0.2   Macula  Flat, central Cystoid macular edema under oil   Vessels  Vascular attenuation   Periphery  good buckle height; good laser surrounding buckle; improved fibrosis; retina attached under oil, retinotomy from 0300-0430 with good laser surrounding; heme resolved        Refraction    Wearing Rx      Sphere Cylinder Axis Add   Right -1.00 Sphere  +2.50   Left [place] +1.00 025 +2.50       Manifest  Refraction      Sphere Cylinder Axis Dist VA   Right       Left +2.25 +0.50 095 20/60+2          IMAGING AND PROCEDURES  Imaging and Procedures for @TODAY @  OCT, Retina - OU - Both Eyes       Right Eye Quality was good. Central Foveal Thickness: 289. Progression has been stable. Findings include normal foveal contour, no IRF, no SRF, epiretinal membrane.   Left Eye Quality was good. Central Foveal Thickness: 480. Progression has worsened. Findings include epiretinal membrane, abnormal foveal contour, no SRF, intraretinal fluid.   Notes *Images captured and stored on drive  Diagnosis / Impression:  OD: mild ERM; NFP; no IRF/SRF OS: retina reattached, interval development of central IRF/CME  Clinical management:  See below  Abbreviations: NFP - Normal foveal profile. CME - cystoid macular edema. PED - pigment epithelial detachment. IRF - intraretinal fluid. SRF - subretinal fluid. EZ - ellipsoid zone. ERM - epiretinal membrane. ORA - outer retinal atrophy. ORT - outer retinal tubulation. SRHM - subretinal hyper-reflective material                 ASSESSMENT/PLAN:    ICD-10-CM   1. 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 Retinal edema H35.81 OCT, Retina - OU - Both Eyes  4. History of retinal detachment Z86.69   5. Pseudophakia of both eyes Z96.1     1-3. Retinal detachment, OS - originally: macula-sparing inferior retinal detachment from 3-6 oclock - large HST at 0500 and 2 small tears at 300 within the detached retina - s/p SBP + PPV/PFC/EL/FAX/14% C3F8 OS, 10.07.19  - progressive fibrosis/PVR just posterior to buckle around 0400 with +SRF tracking posterior to buckle -- focal tractional/PVR detachment OS - now s/p PPV/MP/PFO/retinotomy/EL/1000cs silicon oil OS, 1.22.2020             - retina  attached and in good position -- good buckle height and laser around retinotomy  - OCT shows central CME under oil              - IOP goodat 14             - cont   PF 4x/day OS  - start Ketorolac QID OS -- due to interval development of CME  - okay to stop Cosopt  - cont PSO prn             - avoid laying flat on back             - post op drop and positioning instructions reviewed             - tylenol/ibuprofen for pain  - f/u 3-4 weeks  4. History of retinal detachment OD - S/P SBP OD 11/26/14 -- Dr. Alan Mulder - looks great -- retina in great position - monitor  5. Pseudophakia OU  - s/p CE/IOL OU ([place])  - doing well  - monitor   Ophthalmic Meds Ordered this visit:  Meds ordered this encounter  Medications  . ketorolac (ACULAR) 0.5 % ophthalmic solution    Sig: Place 1 drop into the left eye 4 (four) times daily.    Dispense:  5 mL    Refill:  0       Return for 3-4 wks, POV.  There are no Patient Instructions on file for this visit.   Explained the diagnoses, plan, and follow up with the patient and they expressed understanding.  Patient expressed understanding of the importance of proper follow up care.   This document serves as a record of services personally performed by Karie Chimera, MD, PhD. It was created on their behalf by Laurian Brim, OA, an ophthalmic assistant. The creation of this record is the provider's dictation and/or activities during the visit.    Electronically signed by: Laurian Brim, OA  02.21.2020 2:08 PM    Karie Chimera, M.D., Ph.D. Diseases & Surgery of the Retina and Vitreous [facility]  I have reviewed the above documentation for accuracy and completeness, and I agree with the above. Karie Chimera, M.D., Ph.D. 01/05/19 2:08 PM    Abbreviations: M myopia (nearsighted); A astigmatism; H hyperopia (farsighted); P presbyopia; Mrx spectacle prescription;  CTL contact lenses; OD right eye; OS left eye; OU both eyes  XT exotropia; ET esotropia; PEK punctate epithelial keratitis; PEE punctate epithelial erosions; DES dry eye  syndrome; MGD meibomian gland dysfunction; ATs artificial tears; PFAT's preservative free artificial tears; NSC nuclear sclerotic cataract; PSC posterior subcapsular cataract; ERM epi-retinal membrane; PVD posterior vitreous detachment; RD retinal detachment; DM diabetes mellitus; DR diabetic retinopathy; NPDR non-proliferative diabetic retinopathy; PDR proliferative diabetic retinopathy; CSME clinically significant macular edema; DME diabetic macular edema; dbh dot blot hemorrhages; CWS cotton wool spot; POAG primary open angle glaucoma; C/D cup-to-disc ratio; HVF humphrey visual field; GVF goldmann visual field; OCT optical coherence tomography; IOP intraocular pressure; BRVO Branch retinal vein occlusion; CRVO central retinal vein occlusion; CRAO central retinal artery occlusion; BRAO branch retinal artery occlusion; RT retinal tear; SB scleral buckle; PPV pars plana vitrectomy; VH Vitreous hemorrhage; PRP panretinal laser photocoagulation; IVK intravitreal kenalog; VMT vitreomacular traction; MH Macular hole;  NVD neovascularization of the disc; NVE neovascularization elsewhere; AREDS age related eye disease study; ARMD age related macular degeneration; POAG primary open angle glaucoma; EBMD epithelial/anterior basement membrane dystrophy; ACIOL anterior chamber intraocular  lens; IOL intraocular lens; PCIOL posterior chamber intraocular lens; Phaco/IOL phacoemulsification with intraocular lens placement; [place] photorefractive keratectomy; LASIK laser assisted in situ keratomileusis; HTN hypertension; DM diabetes mellitus; COPD chronic obstructive pulmonary disease

## 2019-01-24 NOTE — Progress Notes (Signed)
[facility] - Clinic Note  01/26/2019     CHIEF COMPLAINT Patient presents for Retina Follow Up   HISTORY OF PRESENT ILLNESS: Carla May is a 78 y.o. female who presents to the clinic today for:   HPI    Retina Follow Up    Patient presents with  Retinal Break/Detachment.  In left eye.  Severity is moderate.  Duration of 3 weeks.  Since onset it is stable.  I, the attending physician,  performed the HPI with the patient and updated documentation appropriately.          Comments    Patient states vision the same OU.        Last edited by Rennis Chris, MD on 01/26/2019  2:15 PM. (History)    pt states her left eye is doing okay   Referring physician: Maurice Small, MD [address] [place], [place] [address]  HISTORICAL INFORMATION:   Selected notes from the MEDICAL RECORD NUMBER Referred by Dr. Ashok Cordia for concern of retinal hole LEE: Josefa Half) [BCVA: OD: OS:] Ocular Hx- PMH-anxiety, arthritis, HLD    CURRENT MEDICATIONS: Current Outpatient Medications (Ophthalmic Drugs)  Medication Sig  . bacitracin-polymyxin b (POLYSPORIN) ophthalmic ointment Place 1 application into the left eye 4 (four) times daily. Apply 1/4 inch ribbon to left eye four times a day (Patient taking differently: Place 1 application into the left eye as needed. Apply 1/4 inch ribbon to left eye four times a day)  . carboxymethylcellulose (REFRESH PLUS) 0.5 % SOLN Place 1 drop into both eyes 3 (three) times daily as needed (dry eyes).   Marland Kitchen ketorolac (ACULAR) 0.5 % ophthalmic solution Place 1 drop into the left eye 4 (four) times daily.  . prednisoLONE acetate (PRED FORTE) 1 % ophthalmic suspension Place 1 drop into the left eye 4 (four) times daily.  . dorzolamide-timolol (COSOPT) 22.3-6.8 MG/ML ophthalmic solution Place 1 drop into the left eye daily. (Patient not taking: Reported on 01/26/2019)  . gatifloxacin (ZYMAXID) 0.5 % SOLN Place 1 drop into the  left eye 4 (four) times daily. (Patient not taking: Reported on 11/16/2018)   No current facility-administered medications for this visit.  (Ophthalmic Drugs)   Current Outpatient Medications (Other)  Medication Sig  . acetaminophen (TYLENOL ARTHRITIS PAIN) 650 MG CR tablet Take 1,300 mg by mouth every 8 (eight) hours as needed for pain.  Marland Kitchen aspirin EC 81 MG tablet Take 81 mg by mouth daily.  . Multiple Vitamin (MULTIVITAMIN WITH MINERALS) TABS tablet Take 1 tablet by mouth daily. One-A-Day Multivitamin  . Omega-3 Fatty Acids (FISH OIL) 1000 MG CAPS Take 1,000 mg by mouth daily.  Marland Kitchen omeprazole (PRILOSEC OTC) 20 MG tablet Take 20 mg by mouth daily.  Marland Kitchen HYDROcodone-acetaminophen (NORCO/VICODIN) 5-325 MG tablet Take 1 tablet by mouth every 4 (four) hours as needed for moderate pain. (Patient not taking: Reported on 01/26/2019)   Current Facility-Administered Medications (Other)  Medication Route  . triamcinolone acetonide (KENALOG-40) injection 4 mg Intravitreal      REVIEW OF SYSTEMS: ROS    Positive for: Gastrointestinal, Musculoskeletal, Cardiovascular, Eyes, Psychiatric   Negative for: Constitutional, Neurological, Skin, Genitourinary, HENT, Endocrine, Respiratory, Allergic/Imm, Heme/Lymph   Last edited by Annalee Genta D on 01/26/2019  1:42 PM. (History)       ALLERGIES Allergies  Allergen Reactions  . Atorvastatin Other (See Comments)    Muscle aches/ memory loss  . Penicillins Itching and Nausea Only  . Sulfa Antibiotics  Nausea Only    PAST MEDICAL HISTORY Past Medical History:  Diagnosis Date  . Anxiety   . Arthritis   . GERD (gastroesophageal reflux disease)   . History of palpitations   . Hyperlipidemia   . MVP (mitral valve prolapse)   . PONV (postoperative nausea and vomiting)    No problems in 08/2018; however has had previous problems  . Pre-diabetes   . Retinal detachment    OS   Past Surgical History:  Procedure Laterality Date  . ABDOMINAL HYSTERECTOMY   1970's  . CATARACT EXTRACTION    . EYE SURGERY Bilateral   . GAS INSERTION Right 11/26/2014   Procedure: INSERTION OF GAS;  Surgeon: Sherrie George, MD;  Location: [facility] OR;  Service: Ophthalmology;  Laterality: Right;  C3F8  . GAS/FLUID EXCHANGE Left 08/21/2018   Procedure: GAS/FLUID EXCHANGE;  Surgeon: Rennis Chris, MD;  Location: [facility] OR;  Service: Ophthalmology;  Laterality: Left;  C3F8  . KNEE ARTHROSCOPY Left   . PHOTOCOAGULATION WITH LASER Right 11/26/2014   Procedure: PHOTOCOAGULATION WITH LASER;  Surgeon: Sherrie George, MD;  Location: [facility] OR;  Service: Ophthalmology;  Laterality: Right;  . PHOTOCOAGULATION WITH LASER Left 08/21/2018   Procedure: PHOTOCOAGULATION WITH LASER;  Surgeon: Rennis Chris, MD;  Location[facility] OR;  Service: Ophthalmology;  Laterality: Left;  . REPAIR OF COMPLEX TRACTION RETINAL DETACHMENT Left 12/06/2018   Procedure: REPAIR OF COMPLEX TRACTION RETINAL DETACHMENT, 25 gauge vitrectomy, endolaser photocoagulation, memebrane peel, perfluoron injection,  and silicone oil;  Surgeon: Rennis Chris, MD;  Location: [facility] OR;  Service: Ophthalmology;  Laterality: Left;  . RETINAL DETACHMENT SURGERY Left 08/21/2018   SBP - Dr. Sherryl Manges  . SCLERAL BUCKLE Right 11/26/2014   Procedure: SCLERAL BUCKLE RIGHT EYE ;  Surgeon: Sherrie George, MD;  Location: [facility] OR;  Service: Ophthalmology;  Laterality: Right;  . SCLERAL BUCKLE WITH POSSIBLE 25 GAUGE PARS PLANA VITRECTOMY Left 08/21/2018   Procedure: SCLERAL BUCKLE WITH 25 GAUGE PARS PLANA VITRECTOMY;  Surgeon: Rennis Chris, MD;  Location: [facility] OR;  Service: Ophthalmology;  Laterality: Left;    FAMILY HISTORY Family History  Problem Relation Age of Onset  . Cancer Mother   . Heart failure Father     SOCIAL HISTORY Social History   Tobacco Use  . Smoking status: Former Smoker    Years: 10.00  . Smokeless tobacco: Never Used  . Tobacco comment: quit in 1970's  Substance Use Topics  . Alcohol use: No  . Drug use: No          OPHTHALMIC EXAM:  Base Eye Exam    Visual Acuity (Snellen - Linear)      Right Left   Dist cc 20/40 20/200   Dist ph cc 20/40 +2 20/60 -2   Correction:  Glasses       Tonometry (Tonopen, 1:47 PM)      Right Left   Pressure 10 18       Pupils      Dark Light Shape React APD   Right 4 3 Round Slow None   Left 7 6.5 Round Slow None       Visual Fields (Counting fingers)      Left Right    Full Full       Extraocular Movement      Right Left    Full, Ortho Full, Ortho       Neuro/Psych    Oriented x3:  Yes   Mood/Affect:  Normal  Dilation    Both eyes:  1.0% Mydriacyl, 2.5% Phenylephrine @ 1:47 PM        Slit Lamp and Fundus Exam    External Exam      Right Left   External  Periorbital edema and ecchymoses -- improved       Slit Lamp Exam      Right Left   Lids/Lashes Mild Meibomian gland dysfunction, Telangiectasia Dermatochalasis - upper lid, Meibomian gland dysfunction, Telangiectasia, trace Ecchymosis nasal upper lid   Conjunctiva/Sclera post surgical changes, healed nicely sutures intact   Cornea 1-2+ Punctate epithelial erosions, Temporal Well healed cataract wounds 2-3+ inferior Punctate epithelial erosions   Anterior Chamber Deep and quiet deep and clear   Iris Round and dilated Round and dilated   Lens PC IOL in good position, trace PCO PC IOL in good position, trace Posterior capsular opacification   Vitreous Vitreous syneresis, mild pigment in anterior vitreous, Posterior vitreous detachment Post vitrectomy, silicon oil -- good fill       Fundus Exam      Right Left   Disc Pink and Sharp Pink and Sharp   C/D Ratio 0.2 0.2   Macula Blunted foveal reflex, Retinal pigment epithelial mottling, ERM, No heme or edema Flat, mild central Cystoid macular edema under oil   Vessels Vascular attenuation Vascular attenuation   Periphery Attached over scleral buckle, good buckle height, good scarring over buckle good buckle height; good laser  surrounding buckle; improved fibrosis; retina attached under oil, retinotomy from 0300-0430 with good laser surrounding        Refraction    Wearing Rx      Sphere Cylinder Axis Add   Right -1.00 Sphere  +2.50   Left [place] +1.00 025 +2.50          IMAGING AND PROCEDURES  Imaging and Procedures for @TODAY @  OCT, Retina - OU - Both Eyes       Right Eye Quality was good. Central Foveal Thickness: 293. Progression has been stable. Findings include normal foveal contour, no IRF, no SRF, epiretinal membrane.   Left Eye Quality was good. Central Foveal Thickness: 479. Progression has worsened. Findings include epiretinal membrane, abnormal foveal contour, intraretinal fluid, subretinal fluid.   Notes *Images captured and stored on drive  Diagnosis / Impression:  OD: mild ERM; NFP; no IRF/SRF OS: retina reattached, persistent central IRF/CME; interval development of SRF  Clinical management:  See below  Abbreviations: NFP - Normal foveal profile. CME - cystoid macular edema. PED - pigment epithelial detachment. IRF - intraretinal fluid. SRF - subretinal fluid. EZ - ellipsoid zone. ERM - epiretinal membrane. ORA - outer retinal atrophy. ORT - outer retinal tubulation. SRHM - subretinal hyper-reflective material        Injection into Tenon's Capsule - OS - Left Eye       Time Out 01/26/2019. 2:53 PM. Confirmed correct patient, procedure, site, and patient consented.   Anesthesia Topical anesthesia was used. Anesthetic medications included Lidocaine 2%, Proparacaine 0.5%.   Procedure Preparation included eyelid speculum, 5% betadine to ocular surface. A 27 gauge needle was used.   Injection:  4 mg triamcinolone acetonide (KENALOG-40) injection   NDC: 0786-7544-92, Lot: [id number], Expiration date: 10/15/2019   Route: Other, Site: Left Eye  Post-op Post injection exam found visual acuity of at least counting fingers. The patient tolerated the procedure well. There were no  complications. The patient received written and verbal post procedure care education.   Notes 0.8 cc of Kenalog-40 (  32 mg) injected into subtenon's capsule in the superotemporal quadrant. Betadine was applied to Injection area pre and post-injection then rinsed with sterile BSS. 1 drop of polymixin was instilled into the eye. There were no complications. Pt tolerated procedure well.                 ASSESSMENT/PLAN:    ICD-10-CM   1. Left retinal detachment H33.22 CANCELED: Intravitreal Injection, Pharmacologic Agent - OS - Left Eye  2. 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 Retinal edema H35.81 OCT, Retina - OU - Both Eyes  4. CME (cystoid macular edema), left H35.352 Injection into Tenon's Capsule - OS - Left Eye    triamcinolone acetonide (KENALOG-40) injection 4 mg  5. History of retinal detachment Z86.69   6. Pseudophakia of both eyes Z96.1     1-3. Retinal detachment, OS - originally: macula-sparing inferior retinal detachment from 3-6 oclock - large HST at 0500 and 2 small tears at 300 within the detached retina - s/p SBP + PPV/PFC/EL/FAX/14% C3F8 OS, 10.07.19  - progressive fibrosis/PVR just posterior to buckle around 0400 with +SRF tracking posterior to buckle -- focal tractional/PVR detachment OS - s/p PPV/MP/PFO/retinotomy/EL/1000cs silicon oil OS, 1.22.2020             - retina attached and in good position -- good buckle height and laser around retinotomy  - OCT shows interval increase in central CME under oil despite PF and Ketorolac QID             - IOP goodat 14             - cont PF 4x/day OS  - cont Ketorolac QID OS -- due to persistent CME  - cont PSO prn             - avoid laying flat on back             - post op drop and positioning instructions reviewed             - tylenol/ibuprofen for pain  - f/u 3-4 weeks  4. CME OS  - likely post-op CME  - interval increase despite PF and ketorolac QID  - recommend sub-tenons kenalog today,  03.13.20  - RBA of procedure discussed, questions answered  - informed consent obtained and signed  - see procedure note  - pt wishes to proceed  - start gatifloxacin QID OS x7 days  5. History of retinal detachment OD - S/P SBP OD 11/26/14 -- Dr. Alan Mulder - looks great -- retina in great position - monitor  6. Pseudophakia OU  - s/p CE/IOL OU ([place])  - doing well  - monitor   Ophthalmic Meds Ordered this visit:  Meds ordered this encounter  Medications  . prednisoLONE acetate (PRED FORTE) 1 % ophthalmic suspension    Sig: Place 1 drop into the left eye 4 (four) times daily.    Dispense:  15 mL    Refill:  0  . gatifloxacin (ZYMAXID) 0.5 % ophthalmic drops 1 drop  . triamcinolone acetonide (KENALOG-40) injection 4 mg       Return for f/u 3-4 weeks, CME OS, DFE, OCT.  There are no Patient Instructions on file for this visit.   Explained the diagnoses, plan, and follow up with the patient and they expressed understanding.  Patient expressed understanding of the importance of proper follow up care.   This document serves as a record of services personally performed by  Karie Chimera, MD, PhD. It was created on their behalf by Laurian Brim, OA, an ophthalmic assistant. The creation of this record is the provider's dictation and/or activities during the visit.    Electronically signed by: Laurian Brim, OA  03.11.2020 11:48 AM    Karie Chimera, M.D., Ph.D. Diseases & Surgery of the Retina and Vitreous [facility]  I have reviewed the above documentation for accuracy and completeness, and I agree with the above. Karie Chimera, M.D., Ph.D. 01/28/19 11:55 AM    Abbreviations: M myopia (nearsighted); A astigmatism; H hyperopia (farsighted); P presbyopia; Mrx spectacle prescription;  CTL contact lenses; OD right eye; OS left eye; OU both eyes  XT exotropia; ET esotropia; PEK punctate epithelial keratitis; PEE punctate epithelial erosions; DES  dry eye syndrome; MGD meibomian gland dysfunction; ATs artificial tears; PFAT's preservative free artificial tears; NSC nuclear sclerotic cataract; PSC posterior subcapsular cataract; ERM epi-retinal membrane; PVD posterior vitreous detachment; RD retinal detachment; DM diabetes mellitus; DR diabetic retinopathy; NPDR non-proliferative diabetic retinopathy; PDR proliferative diabetic retinopathy; CSME clinically significant macular edema; DME diabetic macular edema; dbh dot blot hemorrhages; CWS cotton wool spot; POAG primary open angle glaucoma; C/D cup-to-disc ratio; HVF humphrey visual field; GVF goldmann visual field; OCT optical coherence tomography; IOP intraocular pressure; BRVO Branch retinal vein occlusion; CRVO central retinal vein occlusion; CRAO central retinal artery occlusion; BRAO branch retinal artery occlusion; RT retinal tear; SB scleral buckle; PPV pars plana vitrectomy; VH Vitreous hemorrhage; PRP panretinal laser photocoagulation; IVK intravitreal kenalog; VMT vitreomacular traction; MH Macular hole;  NVD neovascularization of the disc; NVE neovascularization elsewhere; AREDS age related eye disease study; ARMD age related macular degeneration; POAG primary open angle glaucoma; EBMD epithelial/anterior basement membrane dystrophy; ACIOL anterior chamber intraocular lens; IOL intraocular lens; PCIOL posterior chamber intraocular lens; Phaco/IOL phacoemulsification with intraocular lens placement; PRK photorefractive keratectomy; LASIK laser assisted in situ keratomileusis; HTN hypertension; DM diabetes mellitus; COPD chronic obstructive pulmonary disease

## 2019-01-26 ENCOUNTER — Ambulatory Visit (INDEPENDENT_AMBULATORY_CARE_PROVIDER_SITE_OTHER): Payer: Medicare Other | Admitting: Ophthalmology

## 2019-01-26 ENCOUNTER — Encounter (INDEPENDENT_AMBULATORY_CARE_PROVIDER_SITE_OTHER): Payer: Self-pay | Admitting: Ophthalmology

## 2019-01-26 ENCOUNTER — Other Ambulatory Visit: Payer: Self-pay

## 2019-01-26 DIAGNOSIS — H35352 Cystoid macular degeneration, left eye: Secondary | ICD-10-CM | POA: Diagnosis not present

## 2019-01-26 DIAGNOSIS — H3581 Retinal edema: Secondary | ICD-10-CM | POA: Diagnosis not present

## 2019-01-26 DIAGNOSIS — H3322 Serous retinal detachment, left eye: Secondary | ICD-10-CM

## 2019-01-26 DIAGNOSIS — Z8669 Personal history of other diseases of the nervous system and sense organs: Secondary | ICD-10-CM

## 2019-01-26 DIAGNOSIS — H3342 Traction detachment of retina, left eye: Secondary | ICD-10-CM

## 2019-01-26 DIAGNOSIS — Z961 Presence of intraocular lens: Secondary | ICD-10-CM

## 2019-01-26 MED ORDER — GATIFLOXACIN 0.5 % OP SOLN: 1.0000 [drp] | Freq: Four times a day (QID) | OPHTHALMIC | Status: AC

## 2019-01-26 MED ORDER — TRIAMCINOLONE ACETONIDE 40 MG/ML IJ SUSP FOR KALEIDOSCOPE
4.0000 mg | INTRAMUSCULAR | Status: AC
  Administered 2019-01-26: 4 mg

## 2019-01-26 MED ORDER — PREDNISOLONE ACETATE 1 % OP SUSP: 1.0000 [drp] | Freq: Four times a day (QID) | OPHTHALMIC | 0 refills | Status: DC

## 2019-01-27 ENCOUNTER — Other Ambulatory Visit (INDEPENDENT_AMBULATORY_CARE_PROVIDER_SITE_OTHER): Payer: Self-pay | Admitting: Ophthalmology

## 2019-01-29 ENCOUNTER — Telehealth (INDEPENDENT_AMBULATORY_CARE_PROVIDER_SITE_OTHER): Payer: Self-pay

## 2019-02-21 NOTE — Progress Notes (Signed)
[facility] - Clinic Note  02/23/2019     CHIEF COMPLAINT Patient presents for Post-op Follow-up   HISTORY OF PRESENT ILLNESS: Carla May is a 78 y.o. female who presents to the clinic today for:   HPI    Post-op Follow-up    In left eye.  Discomfort includes pain and floaters.  Negative for itching, foreign body sensation, tearing and discharge.  Vision is stable.  I, the attending physician,  performed the HPI with the patient and updated documentation appropriately.          Comments    F/U CME OS. Patient states her vision is stable , she occasionally has floaters and slight pain os. Denies flashes and light sensitivity. Pt is using gtt's as instructed.        Last edited by Rennis Chris, MD on 02/23/2019  8:53 AM. (History)    pt states her left eye is doing okay   Referring physician: Maurice Small, MD [address] [place], [place] [address]  HISTORICAL INFORMATION:   Selected notes from the MEDICAL RECORD NUMBER Referred by Dr. Ashok Cordia for concern of retinal hole LEE: Josefa Half) [BCVA: OD: OS:] Ocular Hx- PMH-anxiety, arthritis, HLD    CURRENT MEDICATIONS: Current Outpatient Medications (Ophthalmic Drugs)  Medication Sig  . bacitracin-polymyxin b (POLYSPORIN) ophthalmic ointment Place 1 application into the left eye 4 (four) times daily. Apply 1/4 inch ribbon to left eye four times a day (Patient taking differently: Place 1 application into the left eye as needed. Apply 1/4 inch ribbon to left eye four times a day)  . carboxymethylcellulose (REFRESH PLUS) 0.5 % SOLN Place 1 drop into both eyes 3 (three) times daily as needed (dry eyes).   . dorzolamide-timolol (COSOPT) 22.3-6.8 MG/ML ophthalmic solution Place 1 drop into the left eye daily.  Marland Kitchen gatifloxacin (ZYMAXID) 0.5 % SOLN Place 1 drop into the left eye 4 (four) times daily.  Marland Kitchen ketorolac (ACULAR) 0.5 % ophthalmic solution Place 1 drop into the left eye 4 (four)  times daily.  . prednisoLONE acetate (PRED FORTE) 1 % ophthalmic suspension Place 1 drop into the left eye 4 (four) times daily.   No current facility-administered medications for this visit.  (Ophthalmic Drugs)   Current Outpatient Medications (Other)  Medication Sig  . acetaminophen (TYLENOL ARTHRITIS PAIN) 650 MG CR tablet Take 1,300 mg by mouth every 8 (eight) hours as needed for pain.  Marland Kitchen aspirin EC 81 MG tablet Take 81 mg by mouth daily.  Marland Kitchen HYDROcodone-acetaminophen (NORCO/VICODIN) 5-325 MG tablet Take 1 tablet by mouth every 4 (four) hours as needed for moderate pain.  . Multiple Vitamin (MULTIVITAMIN WITH MINERALS) TABS tablet Take 1 tablet by mouth daily. One-A-Day Multivitamin  . Omega-3 Fatty Acids (FISH OIL) 1000 MG CAPS Take 1,000 mg by mouth daily.  Marland Kitchen omeprazole (PRILOSEC OTC) 20 MG tablet Take 20 mg by mouth daily.   Current Facility-Administered Medications (Other)  Medication Route  . triamcinolone acetonide (KENALOG-40) injection 4 mg Intravitreal      REVIEW OF SYSTEMS: ROS    Positive for: Eyes   Negative for: Constitutional, Gastrointestinal, Neurological, Skin, Genitourinary, Musculoskeletal, HENT, Endocrine, Cardiovascular, Respiratory, Psychiatric, Allergic/Imm, Heme/Lymph   Last edited by Eldridge Scot, LPN on 2/50/0370  8:29 AM. (History)       ALLERGIES Allergies  Allergen Reactions  . Atorvastatin Other (See Comments)    Muscle aches/ memory loss  . Penicillins Itching and Nausea Only  . Sulfa  Antibiotics Nausea Only    PAST MEDICAL HISTORY Past Medical History:  Diagnosis Date  . Anxiety   . Arthritis   . GERD (gastroesophageal reflux disease)   . History of palpitations   . Hyperlipidemia   . MVP (mitral valve prolapse)   . PONV (postoperative nausea and vomiting)    No problems in 08/2018; however has had previous problems  . Pre-diabetes   . Retinal detachment    OS   Past Surgical History:  Procedure Laterality Date  .  ABDOMINAL HYSTERECTOMY  1970's  . CATARACT EXTRACTION    . EYE SURGERY Bilateral   . GAS INSERTION Right 11/26/2014   Procedure: INSERTION OF GAS;  Surgeon: Sherrie George, MD;  Location: [facility] OR;  Service: Ophthalmology;  Laterality: Right;  C3F8  . GAS/FLUID EXCHANGE Left 08/21/2018   Procedure: GAS/FLUID EXCHANGE;  Surgeon: Rennis Chris, MD;  Location: [facility] OR;  Service: Ophthalmology;  Laterality: Left;  C3F8  . KNEE ARTHROSCOPY Left   . PHOTOCOAGULATION WITH LASER Right 11/26/2014   Procedure: PHOTOCOAGULATION WITH LASER;  Surgeon: Sherrie George, MD;  Location: [facility] OR;  Service: Ophthalmology;  Laterality: Right;  . PHOTOCOAGULATION WITH LASER Left 08/21/2018   Procedure: PHOTOCOAGULATION WITH LASER;  Surgeon: Rennis Chris, MD;  Location: [facility] OR;  Service: Ophthalmology;  Laterality: Left;  . REPAIR OF COMPLEX TRACTION RETINAL DETACHMENT Left 12/06/2018   Procedure: REPAIR OF COMPLEX TRACTION RETINAL DETACHMENT, 25 gauge vitrectomy, endolaser photocoagulation, memebrane peel, perfluoron injection,  and silicone oil;  Surgeon: Rennis Chris, MD;  Location: [facility] OR;  Service: Ophthalmology;  Laterality: Left;  . RETINAL DETACHMENT SURGERY Left 08/21/2018   SBP - Dr. Sherryl Manges  . SCLERAL BUCKLE Right 11/26/2014   Procedure: SCLERAL BUCKLE RIGHT EYE ;  Surgeon: Sherrie George, MD;  Location: [facility] OR;  Service: Ophthalmology;  Laterality: Right;  . SCLERAL BUCKLE WITH POSSIBLE 25 GAUGE PARS PLANA VITRECTOMY Left 08/21/2018   Procedure: SCLERAL BUCKLE WITH 25 GAUGE PARS PLANA VITRECTOMY;  Surgeon: Rennis Chris, MD;  Location: [facility] OR;  Service: Ophthalmology;  Laterality: Left;    FAMILY HISTORY Family History  Problem Relation Age of Onset  . Cancer Mother   . Heart failure Father     SOCIAL HISTORY Social History   Tobacco Use  . Smoking status: Former Smoker    Years: 10.00  . Smokeless tobacco: Never Used  . Tobacco comment: quit in 1970's  Substance Use Topics  . Alcohol use: No  .  Drug use: No         OPHTHALMIC EXAM:  Base Eye Exam    Visual Acuity (Snellen - Linear)      Right Left   Dist cc 20/40 20/150   Dist ph cc NI 20/70 +2   Correction:  Glasses       Tonometry (Tonopen, 8:46 AM)      Right Left   Pressure 12 16       Pupils      Dark Light Shape React APD   Right 4 3 Round Brisk None   Left 5  Round  None       Visual Fields (Counting fingers)      Left Right    Full Full       Extraocular Movement      Right Left    Full, Ortho Full, Ortho       Neuro/Psych    Oriented x3:  Yes   Mood/Affect:  Normal  Dilation    Both eyes:  1.0% Mydriacyl, 2.5% Phenylephrine @ 8:47 AM        Slit Lamp and Fundus Exam    External Exam      Right Left   External  Periorbital edema and ecchymoses -- improved       Slit Lamp Exam      Right Left   Lids/Lashes Mild Meibomian gland dysfunction, Telangiectasia Dermatochalasis - upper lid, Meibomian gland dysfunction, Telangiectasia, trace Ecchymosis nasal upper lid   Conjunctiva/Sclera post surgical changes, healed nicely sutures intact   Cornea 1-2+ Punctate epithelial erosions, Temporal Well healed cataract wounds 2-3+ inferior Punctate epithelial erosions   Anterior Chamber Deep and quiet deep, 1+pigment   Iris Round and dilated Round and dilated   Lens PC IOL in good position, trace PCO PC IOL in good position, trace Posterior capsular opacification   Vitreous Vitreous syneresis, mild pigment in anterior vitreous, Posterior vitreous detachment Post vitrectomy, silicon oil -- good fill       Fundus Exam      Right Left   Disc Pink and Sharp Pink and Sharp   C/D Ratio 0.2 0.2   Macula Blunted foveal reflex, Retinal pigment epithelial mottling, ERM, No heme or edema Flat, Good foveal reflex under oil   Vessels Vascular attenuation Vascular attenuation   Periphery Attached over scleral buckle, good buckle height, good scarring over buckle good buckle height; good laser surrounding  buckle; improved fibrosis; retina attached under oil, retinotomy from 0300-0430 with good laser surrounding          IMAGING AND PROCEDURES  Imaging and Procedures for @  OCT, Retina - OU - Both Eyes       Right Eye Quality was good. Central Foveal Thickness: 292. Progression has been stable. Findings include normal foveal contour, no IRF, no SRF, epiretinal membrane.   Left Eye Quality was good. Central Foveal Thickness: 327. Progression has improved. Findings include epiretinal membrane, intraretinal fluid, normal foveal contour, no SRF (Interval improvement in IRF/interval resolution of SRF).   Notes *Images captured and stored on drive  Diagnosis / Impression:  OD: mild ERM; NFP; no IRF/SRF OS: retina reattached, Interval improvement in IRF/CME, interval resolution of SRF  Clinical management:  See below  Abbreviations: NFP - Normal foveal profile. CME - cystoid macular edema. PED - pigment epithelial detachment. IRF - intraretinal fluid. SRF - subretinal fluid. EZ - ellipsoid zone. ERM - epiretinal membrane. ORA - outer retinal atrophy. ORT - outer retinal tubulation. SRHM - subretinal hyper-reflective material                 ASSESSMENT/PLAN:    ICD-10-CM   1. 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 Retinal edema H35.81 OCT, Retina - OU - Both Eyes  4. 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 History of retinal detachment Z86.69   6. Pseudophakia of both eyes Z96.1     1-3. Retinal detachment, OS - originally: macula-sparing inferior retinal detachment from 3-6 oclock - large HST at 0500 and 2 small tears at 300 within the detached retina - s/p SBP + PPV/PFC/EL/FAX/14% C3F8 OS, 10.07.19  - progressive fibrosis/PVR just posterior to buckle around 0400 with +SRF tracking posterior to buckle -- focal tractional/PVR detachment OS - s/p PPV/MP/PFO/retinotomy/EL/1000cs silicon oil OS, 1.22.2020              - retina attached and in good position -- good buckle  height and laser around retinotomy  - OCT shows interval improvement in IRF and interval resolution of SRF             - IOP goodat 16             - cont PF 4x/day OS  - cont Ketorolac QID OS             - avoid laying flat on back             - post op drop and positioning instructions reviewed             - tylenol/ibuprofen for pain  - f/u 6 weeks  4. CME OS  - likely post-op CME  - initially treated w/ PF and ketorolac -- had interval increase in CME  - s/p sub-tenons kenalog (03.13.20) -- interval improvement in IRF/resolution of SRF  - cont PF and ketorolac QID OS  - f/u 6 wks  5. History of retinal detachment OD - S/P SBP OD 11/26/14 -- Dr. Alan MulderJohn Matthews - looks great -- retina in great position - monitor  6. Pseudophakia OU  - s/p CE/IOL OU ([facility])  - doing well  - monitor   Ophthalmic Meds Ordered this visit:  Meds ordered this encounter  Medications  . ketorolac (ACULAR) 0.5 % ophthalmic solution    Sig: Place 1 drop into the left eye 4 (four) times daily.    Dispense:  10 mL    Refill:  5  . prednisoLONE acetate (PRED FORTE) 1 % ophthalmic suspension    Sig: Place 1 drop into the left eye 4 (four) times daily.    Dispense:  15 mL    Refill:  1       Return in about 6 weeks (around 04/06/2019) for f/u RD OS, DFE, OCT.  There are no Patient Instructions on file for this visit.   Explained the diagnoses, plan, and follow up with the patient and they expressed understanding.  Patient expressed understanding of the importance of proper follow up care.   This document serves as a record of services personally performed by Karie ChimeraBrian G. Draedyn Weidinger, MD, PhD. It was created on their behalf by Laurian BrimAmanda Brown, OA, an ophthalmic assistant. The creation of this record is the provider's dictation and/or activities during the visit.    Electronically signed by: Laurian BrimAmanda Brown, OA  04.08.2020 11:07 AM    Karie ChimeraBrian G. Piper Hassebrock,  M.D., Ph.D. Diseases & Surgery of the Retina and Vitreous [facility]  I have reviewed the above documentation for accuracy and completeness, and I agree with the above. Karie ChimeraBrian G. Nin[place]ka Wainwright, M.D., Ph.D. 02/23/19 11:07 AM     Abbreviations: M myopia (nearsighted); A astigmatism; H hyperopia (farsighted); P presbyopia; Mrx spectacle prescription;  CTL contact lenses; OD right eye; OS left eye; OU both eyes  XT exotropia; ET esotropia; PEK punctate epithelial keratitis; PEE punctate epithelial erosions; DES dry eye syndrome; MGD meibomian gland dysfunction; ATs artificial tears; PFAT's preservative free artificial tears; NSC nuclear sclerotic cataract; PSC posterior subcapsular cataract; ERM epi-retinal membrane; PVD posterior vitreous detachment; RD retinal detachment; DM diabetes mellitus; DR diabetic retinopathy; NPDR non-proliferative diabetic retinopathy; PDR proliferative diabetic retinopathy; CSME clinically significant macular edema; DME diabetic macular edema; dbh dot blot hemorrhages; CWS cotton wool spot; POAG primary open angle glaucoma; C/D cup-to-disc ratio; HVF humphrey visual field; GVF goldmann visual field; OCT optical coherence tomography; IOP intraocular pressure; BRVO Branch retinal vein occlusion; CRVO central retinal  vein occlusion; CRAO central retinal artery occlusion; BRAO branch retinal artery occlusion; RT retinal tear; SB scleral buckle; PPV pars plana vitrectomy; VH Vitreous hemorrhage; PRP panretinal laser photocoagulation; IVK intravitreal kenalog; VMT vitreomacular traction; MH Macular hole;  NVD neovascularization of the disc; NVE neovascularization elsewhere; AREDS age related eye disease study; ARMD age related macular degeneration; POAG primary open angle glaucoma; EBMD epithelial/anterior basement membrane dystrophy; ACIOL anterior chamber intraocular lens; IOL intraocular lens; PCIOL posterior chamber intraocular lens; Phaco/IOL phacoemulsification  with intraocular lens placement; PRK photorefractive keratectomy; LASIK laser assisted in situ keratomileusis; HTN hypertension; DM diabetes mellitus; COPD chronic obstructive pulmonary disease

## 2019-02-23 ENCOUNTER — Encounter (INDEPENDENT_AMBULATORY_CARE_PROVIDER_SITE_OTHER): Payer: Self-pay | Admitting: Ophthalmology

## 2019-02-23 ENCOUNTER — Other Ambulatory Visit: Payer: Self-pay

## 2019-02-23 ENCOUNTER — Ambulatory Visit (INDEPENDENT_AMBULATORY_CARE_PROVIDER_SITE_OTHER): Payer: Medicare Other | Admitting: Ophthalmology

## 2019-02-23 DIAGNOSIS — H3322 Serous retinal detachment, left eye: Secondary | ICD-10-CM

## 2019-02-23 DIAGNOSIS — Z961 Presence of intraocular lens: Secondary | ICD-10-CM

## 2019-02-23 DIAGNOSIS — H35352 Cystoid macular degeneration, left eye: Secondary | ICD-10-CM

## 2019-02-23 DIAGNOSIS — H3342 Traction detachment of retina, left eye: Secondary | ICD-10-CM

## 2019-02-23 DIAGNOSIS — H3581 Retinal edema: Secondary | ICD-10-CM | POA: Diagnosis not present

## 2019-02-23 DIAGNOSIS — Z8669 Personal history of other diseases of the nervous system and sense organs: Secondary | ICD-10-CM

## 2019-02-23 MED ORDER — KETOROLAC TROMETHAMINE 0.5 % OP SOLN: 1.0000 [drp] | Freq: Four times a day (QID) | OPHTHALMIC | 5 refills | Status: DC

## 2019-02-23 MED ORDER — PREDNISOLONE ACETATE 1 % OP SUSP: 1.0000 [drp] | Freq: Four times a day (QID) | OPHTHALMIC | 1 refills | Status: DC

## 2019-04-04 ENCOUNTER — Encounter (INDEPENDENT_AMBULATORY_CARE_PROVIDER_SITE_OTHER): Payer: Medicare Other | Admitting: Ophthalmology

## 2019-04-04 NOTE — Progress Notes (Signed)
[facility] - Clinic Note  04/05/2019     CHIEF COMPLAINT Patient presents for Retina Follow Up and Post-op Follow-up   HISTORY OF PRESENT ILLNESS: Carla May is a 78 y.o. female who presents to the clinic today for:   HPI    Retina Follow Up    Patient presents with  Retinal Break/Detachment.  In left eye.  I, the attending physician,  performed the HPI with the patient and updated documentation appropriately.          Post-op Follow-up    In left eye.  Vision is worse.  I, the attending physician,  performed the HPI with the patient and updated documentation appropriately.          Comments    Patient states her vision seems worse in her left eye.  Patient denies eye pain or discomfort and denies any new or worsening floaters or fol OU.       Last edited by Rennis Chris, MD on 04/05/2019 11:46 AM. (History)    pt states it is difficult to read with her left eye, pt is still eager to have silicone oil removed   Referring physician: Maurice Small, MD [address] [place], [place] [address]  HISTORICAL INFORMATION:   Selected notes from the MEDICAL RECORD NUMBER Referred by Dr. Ashok Cordia for concern of retinal hole LEE: Josefa Half) [BCVA: OD: OS:] Ocular Hx- PMH-anxiety, arthritis, HLD    CURRENT MEDICATIONS: Current Outpatient Medications (Ophthalmic Drugs)  Medication Sig  . bacitracin-polymyxin b (POLYSPORIN) ophthalmic ointment Place 1 application into the left eye 4 (four) times daily. Apply 1/4 inch ribbon to left eye four times a day (Patient taking differently: Place 1 application into the left eye as needed. Apply 1/4 inch ribbon to left eye four times a day)  . carboxymethylcellulose (REFRESH PLUS) 0.5 % SOLN Place 1 drop into both eyes 3 (three) times daily as needed (dry eyes).   . dorzolamide-timolol (COSOPT) 22.3-6.8 MG/ML ophthalmic solution Place 1 drop into the left eye daily.  Marland Kitchen gatifloxacin (ZYMAXID)  0.5 % SOLN Place 1 drop into the left eye 4 (four) times daily.  Marland Kitchen ketorolac (ACULAR) 0.5 % ophthalmic solution Place 1 drop into the left eye 4 (four) times daily.  . prednisoLONE acetate (PRED FORTE) 1 % ophthalmic suspension Place 1 drop into the left eye 4 (four) times daily.   No current facility-administered medications for this visit.  (Ophthalmic Drugs)   Current Outpatient Medications (Other)  Medication Sig  . acetaminophen (TYLENOL ARTHRITIS PAIN) 650 MG CR tablet Take 1,300 mg by mouth every 8 (eight) hours as needed for pain.  Marland Kitchen aspirin EC 81 MG tablet Take 81 mg by mouth daily.  Marland Kitchen HYDROcodone-acetaminophen (NORCO/VICODIN) 5-325 MG tablet Take 1 tablet by mouth every 4 (four) hours as needed for moderate pain.  . Multiple Vitamin (MULTIVITAMIN WITH MINERALS) TABS tablet Take 1 tablet by mouth daily. One-A-Day Multivitamin  . Omega-3 Fatty Acids (FISH OIL) 1000 MG CAPS Take 1,000 mg by mouth daily.  Marland Kitchen omeprazole (PRILOSEC OTC) 20 MG tablet Take 20 mg by mouth daily.   Current Facility-Administered Medications (Other)  Medication Route  . triamcinolone acetonide (KENALOG-40) injection 4 mg Intravitreal      REVIEW OF SYSTEMS: ROS    Positive for: Eyes   Negative for: Constitutional, Gastrointestinal, Neurological, Skin, Genitourinary, Musculoskeletal, HENT, Endocrine, Cardiovascular, Respiratory, Psychiatric, Allergic/Imm, Heme/Lymph   Last edited by Corrinne Eagle on 04/05/2019  9:41 AM. (History)       ALLERGIES Allergies  Allergen Reactions  . Atorvastatin Other (See Comments)    Muscle aches/ memory loss  . Penicillins Itching and Nausea Only  . Sulfa Antibiotics Nausea Only    PAST MEDICAL HISTORY Past Medical History:  Diagnosis Date  . Anxiety   . Arthritis   . GERD (gastroesophageal reflux disease)   . History of palpitations   . Hyperlipidemia   . MVP (mitral valve prolapse)   . PONV (postoperative nausea and vomiting)    No problems in 08/2018;  however has had previous problems  . Pre-diabetes   . Retinal detachment    OS   Past Surgical History:  Procedure Laterality Date  . ABDOMINAL HYSTERECTOMY  1970's  . CATARACT EXTRACTION    . EYE SURGERY Bilateral   . GAS INSERTION Right 11/26/2014   Procedure: INSERTION OF GAS;  Surgeon: Sherrie George, MD;  Location: [facility] OR;  Service: Ophthalmology;  Laterality: Right;  C3F8  . GAS/FLUID EXCHANGE Left 08/21/2018   Procedure: GAS/FLUID EXCHANGE;  Surgeon: Rennis Chris, MD;  Location: [facility] OR;  Service: Ophthalmology;  Laterality: Left;  C3F8  . KNEE ARTHROSCOPY Left   . PHOTOCOAGULATION WITH LASER Right 11/26/2014   Procedure: PHOTOCOAGULATION WITH LASER;  Surgeon: Sherrie George, MD;  Location: [facility] OR;  Service: Ophthalmology;  Laterality: Right;  . PHOTOCOAGULATION WITH LASER Left 08/21/2018   Procedure: PHOTOCOAGULATION WITH LASER;  Surgeon: Rennis Chris, MD;  Location: [facility] OR;  Service: Ophthalmology;  Laterality: Left;  . REPAIR OF COMPLEX TRACTION RETINAL DETACHMENT Left 12/06/2018   Procedure: REPAIR OF COMPLEX TRACTION RETINAL DETACHMENT, 25 gauge vitrectomy, endolaser photocoagulation, memebrane peel, perfluoron injection,  and silicone oil;  Surgeon: Rennis Chris, MD;  Location: [facility] OR;  Service: Ophthalmology;  Laterality: Left;  . RETINAL DETACHMENT SURGERY Left 08/21/2018   SBP - Dr. Sherryl Manges  . SCLERAL BUCKLE Right 11/26/2014   Procedure: SCLERAL BUCKLE RIGHT EYE ;  Surgeon: Sherrie George, MD;  Location: [facility] OR;  Service: Ophthalmology;  Laterality: Right;  . SCLERAL BUCKLE WITH POSSIBLE 25 GAUGE PARS PLANA VITRECTOMY Left 08/21/2018   Procedure: SCLERAL BUCKLE WITH 25 GAUGE PARS PLANA VITRECTOMY;  Surgeon: Rennis Chris, MD;  Location: [facility] OR;  Service: Ophthalmology;  Laterality: Left;    FAMILY HISTORY Family History  Problem Relation Age of Onset  . Cancer Mother   . Heart failure Father     SOCIAL HISTORY Social History   Tobacco Use  . Smoking status: Former  Smoker    Years: 10.00  . Smokeless tobacco: Never Used  . Tobacco comment: quit in 1970's  Substance Use Topics  . Alcohol use: No  . Drug use: No         OPHTHALMIC EXAM:  Base Eye Exam    Visual Acuity (Snellen - Linear)      Right Left   Dist cc 20/40 +2 20/80 -3   Dist ph cc NI 20/50 +1       Tonometry (Tonopen, 9:48 AM)      Right Left   Pressure 18 25       Pupils      Dark Light Shape React APD   Right 4 3 Round Brisk 0   Left 6 5 Round Minimal 0       Extraocular Movement      Right Left    Full Full       Neuro/Psych    Oriented x3:  Yes   Mood/Affect:  Normal       Dilation    Both eyes:  1.0% Mydriacyl, 2.5% Phenylephrine @ 9:48 AM        Slit Lamp and Fundus Exam    External Exam      Right Left   External  Periorbital edema and ecchymoses -- improved       Slit Lamp Exam      Right Left   Lids/Lashes Mild Meibomian gland dysfunction, Telangiectasia Mild Telangiectasia, Ptosis   Conjunctiva/Sclera post surgical changes, healed nicely White and quiet   Cornea 1-2+ Punctate epithelial erosions, Temporal Well healed cataract wounds, Debris in tear film 2-3+ inferior Punctate epithelial erosions   Anterior Chamber Deep and quiet Deep and quiet   Iris Round and dilated Round and dilated   Lens PC IOL in good position, trace PCO PC IOL in good position, trace Posterior capsular opacification   Vitreous Vitreous syneresis, mild pigment in anterior vitreous, Posterior vitreous detachment; scattered vitreous debris Post vitrectomy, silicon oil -- good fill       Fundus Exam      Right Left   Disc Pink and Sharp Sharp rim, mild tilt, temporal pallor   C/D Ratio 0.2 0.2   Macula Blunted foveal reflex, Retinal pigment epithelial mottling, ERM, No heme or edema Flat, Good foveal reflex under oil, no heme   Vessels Vascular attenuation Vascular attenuation   Periphery Attached over scleral buckle, good buckle height, good scarring over buckle good  buckle height; good laser surrounding buckle; improved fibrosis; retina attached under oil, retinotomy from 0300-0430 with good laser surrounding        Refraction    Wearing Rx      Sphere Cylinder Axis Add   Right -1.00 Sphere  +2.50   Left [place] +1.00 025 +2.50       Manifest Refraction      Sphere Cylinder Axis Dist VA   Right -1.00 Sphere  20/40+2   Left -0.50 +1.00 025 20/60+1          IMAGING AND PROCEDURES  Imaging and Procedures for @TODAY @  OCT, Retina - OU - Both Eyes       Right Eye Quality was good. Central Foveal Thickness: 294. Progression has been stable. Findings include normal foveal contour, no IRF, no SRF, epiretinal membrane.   Left Eye Quality was good. Central Foveal Thickness: 327. Progression has improved. Findings include epiretinal membrane, normal foveal contour, no SRF, no IRF (CME resolved).   Notes *Images captured and stored on drive  Diagnosis / Impression:  OD: mild ERM; NFP; no IRF/SRF OS: retina reattached; no IRF/SRF; CME resolved  Clinical management:  See below  Abbreviations: NFP - Normal foveal profile. CME - cystoid macular edema. PED - pigment epithelial detachment. IRF - intraretinal fluid. SRF - subretinal fluid. EZ - ellipsoid zone. ERM - epiretinal membrane. ORA - outer retinal atrophy. ORT - outer retinal tubulation. SRHM - subretinal hyper-reflective material                 ASSESSMENT/PLAN:    ICD-10-CM   1. 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 Retinal edema H35.81 OCT, Retina - OU - Both Eyes  4. 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 History of retinal detachment Z86.69   6. Pseudophakia of both eyes Z96.1     1-4. Retinal detachment, OS  - originally: macula-sparing inferior retinal detachment from  3-6 oclock  - large HST at 0500 and 2 small tears at 300 within the detached retina  - s/p SBP + PPV/PFC/EL/FAX/14% C3F8 OS, 10.07.19   -  progressive fibrosis/PVR just posterior to buckle around 0400 with +SRF tracking posterior to buckle -- focal tractional/PVR detachment OS  - s/p PPV/MP/PFO/retinotomy/EL/1000cs silicon oil OS, 1.22.2020             - retina attached and in good position -- good buckle height and laser around retinotomy  - OCT shows interval resolution of CME             - IOP elevated to 25 today -- possible delayed steroid response  - recommend re-starting Cosopt BID OS             - start PF taper -- 4,3,2,1 drops daily, decrease weekly  - stop Ketorolac             - avoid laying flat on back             - post op drop and positioning instructions reviewed             - discussed possibility of removing silicon oil in June or July -- pt to discuss with husband  - f/u 3 weeks for IOP check and possible surgical planning  5. CME OS  - post-op CME  - initially treated w/ PF and ketorolac -- had interval increase in CME  - s/p sub-tenons kenalog (03.13.20) -- resolved today  - start PF taper and discontinue ketorolac QID OS as above  6. History of retinal detachment OD  - S/P SBP OD 11/26/14 -- Dr. Alan Mulder  - looks great -- retina in great position  - monitor  7. Pseudophakia OU  - s/p CE/IOL OU ([place])  - doing well  - monitor   Ophthalmic Meds Ordered this visit:  No orders of the defined types were placed in this encounter.      Return in about 3 weeks (around 04/26/2019) for f/u IOP check OS.  There are no Patient Instructions on file for this visit.   Explained the diagnoses, plan, and follow up with the patient and they expressed understanding.  Patient expressed understanding of the importance of proper follow up care.   This document serves as a record of services personally performed by Karie Chimera, MD, PhD. It was created on their behalf by Laurian Brim, OA, an ophthalmic assistant. The creation of this record is the provider's dictation and/or activities during the  visit.    Electronically signed by: Laurian Brim, OA 05.20.2020 11:48 AM    Karie Chimera, M.D., Ph.D. Diseases & Surgery of the Retina and Vitreous [facility]  I have reviewed the above documentation for accuracy and completeness, and I agree with the above. Karie Chimera, M.D., Ph.D. 04/05/19 11:51 AM    Abbreviations: M myopia (nearsighted); A astigmatism; H hyperopia (farsighted); P presbyopia; Mrx spectacle prescription;  CTL contact lenses; OD right eye; OS left eye; OU both eyes  XT exotropia; ET esotropia; PEK punctate epithelial keratitis; PEE punctate epithelial erosions; DES dry eye syndrome; MGD meibomian gland dysfunction; ATs artificial tears; PFAT's preservative free artificial tears; NSC nuclear sclerotic cataract; PSC posterior subcapsular cataract; ERM epi-retinal membrane; PVD posterior vitreous detachment; RD retinal detachment; DM diabetes mellitus; DR diabetic retinopathy; NPDR non-proliferative diabetic retinopathy; PDR proliferative diabetic retinopathy; CSME clinically significant macular edema; DME diabetic macular edema; dbh dot blot  hemorrhages; CWS cotton wool spot; POAG primary open angle glaucoma; C/D cup-to-disc ratio; HVF humphrey visual field; GVF goldmann visual field; OCT optical coherence tomography; IOP intraocular pressure; BRVO Branch retinal vein occlusion; CRVO central retinal vein occlusion; CRAO central retinal artery occlusion; BRAO branch retinal artery occlusion; RT retinal tear; SB scleral buckle; PPV pars plana vitrectomy; VH Vitreous hemorrhage; PRP panretinal laser photocoagulation; IVK intravitreal kenalog; VMT vitreomacular traction; MH Macular hole;  NVD neovascularization of the disc; NVE neovascularization elsewhere; AREDS age related eye disease study; ARMD age related macular degeneration; POAG primary open angle glaucoma; EBMD epithelial/anterior basement membrane dystrophy; ACIOL anterior chamber intraocular lens;  IOL intraocular lens; PCIOL posterior chamber intraocular lens; Phaco/IOL phacoemulsification with intraocular lens placement; PRK photorefractive keratectomy; LASIK laser assisted in situ keratomileusis; HTN hypertension; DM diabetes mellitus; COPD chronic obstructive pulmonary disease

## 2019-04-05 ENCOUNTER — Encounter (INDEPENDENT_AMBULATORY_CARE_PROVIDER_SITE_OTHER): Payer: Self-pay | Admitting: Ophthalmology

## 2019-04-05 ENCOUNTER — Ambulatory Visit (INDEPENDENT_AMBULATORY_CARE_PROVIDER_SITE_OTHER): Payer: Medicare Other | Admitting: Ophthalmology

## 2019-04-05 ENCOUNTER — Other Ambulatory Visit: Payer: Self-pay

## 2019-04-05 DIAGNOSIS — H3581 Retinal edema: Secondary | ICD-10-CM | POA: Diagnosis not present

## 2019-04-05 DIAGNOSIS — H40052 Ocular hypertension, left eye: Secondary | ICD-10-CM

## 2019-04-05 DIAGNOSIS — H3342 Traction detachment of retina, left eye: Secondary | ICD-10-CM

## 2019-04-05 DIAGNOSIS — H35352 Cystoid macular degeneration, left eye: Secondary | ICD-10-CM

## 2019-04-05 DIAGNOSIS — H3322 Serous retinal detachment, left eye: Secondary | ICD-10-CM

## 2019-04-05 DIAGNOSIS — Z961 Presence of intraocular lens: Secondary | ICD-10-CM

## 2019-04-05 DIAGNOSIS — Z8669 Personal history of other diseases of the nervous system and sense organs: Secondary | ICD-10-CM

## 2019-04-06 ENCOUNTER — Encounter (INDEPENDENT_AMBULATORY_CARE_PROVIDER_SITE_OTHER): Payer: Medicare Other | Admitting: Ophthalmology

## 2019-04-25 NOTE — Progress Notes (Signed)
[facility] Note  04/27/2019     CHIEF COMPLAINT Patient presents for Post-op Follow-up   HISTORY OF PRESENT ILLNESS: Carla May is a 78 y.o. female who presents to the clinic today for:   HPI    Post-op Follow-up    In left eye.  Discomfort includes pain.  Vision is stable.  I, the attending physician,  performed the HPI with the patient and updated documentation appropriately.          Comments    Patient states her vision comes and goes in the left eye.  Patient complains of pain in the left eye that is sometimes so bad that it wakes her up during the night.  Patient denies any new or worsening floaters or fol.       Last edited by Bernarda Caffey, MD on 04/27/2019 11:16 AM. (History)    pt states she wants to move forward with surgery to remove the oil from her eye, pt states she has had quite a bit of pain since her last visit, she states it wakes up her at night and she has to take tylenol for it, she states she is using cosopt BID and PF   Referring physician: Kelton Pillar, MD [place] [place],  [place] [address]  HISTORICAL INFORMATION:   Selected notes from the MEDICAL RECORD NUMBER Referred by Dr. Len Blalock for concern of retinal hole LEE: Mellody Memos) [BCVA: OD: OS:] Ocular Hx- PMH-anxiety, arthritis, HLD    CURRENT MEDICATIONS: Current Outpatient Medications (Ophthalmic Drugs)  Medication Sig  . bacitracin-polymyxin b (POLYSPORIN) ophthalmic ointment Place 1 application into the left eye 4 (four) times daily. Apply 1/4 inch ribbon to left eye four times a day (Patient taking differently: Place 1 application into the left eye as needed. Apply 1/4 inch ribbon to left eye four times a day)  . carboxymethylcellulose (REFRESH PLUS) 0.5 % SOLN Place 1 drop into both eyes 3 (three) times daily as needed (dry eyes).   . dorzolamide-timolol (COSOPT) 22.3-6.8 MG/ML ophthalmic solution Place 1 drop into the left eye  daily.  Marland Kitchen gatifloxacin (ZYMAXID) 0.5 % SOLN Place 1 drop into the left eye 4 (four) times daily.  Marland Kitchen ketorolac (ACULAR) 0.5 % ophthalmic solution Place 1 drop into the left eye 4 (four) times daily.  . prednisoLONE acetate (PRED FORTE) 1 % ophthalmic suspension Place 1 drop into the left eye 4 (four) times daily.   No current facility-administered medications for this visit.  (Ophthalmic Drugs)   Current Outpatient Medications (Other)  Medication Sig  . acetaminophen (TYLENOL ARTHRITIS PAIN) 650 MG CR tablet Take 1,300 mg by mouth every 8 (eight) hours as needed for pain.  Marland Kitchen aspirin EC 81 MG tablet Take 81 mg by mouth daily.  Marland Kitchen HYDROcodone-acetaminophen (NORCO/VICODIN) 5-325 MG tablet Take 1 tablet by mouth every 4 (four) hours as needed for moderate pain.  . Multiple Vitamin (MULTIVITAMIN WITH MINERALS) TABS tablet Take 1 tablet by mouth daily. One-A-Day Multivitamin  . Omega-3 Fatty Acids (FISH OIL) 1000 MG CAPS Take 1,000 mg by mouth daily.  Marland Kitchen omeprazole (PRILOSEC OTC) 20 MG tablet Take 20 mg by mouth daily.   Current Facility-Administered Medications (Other)  Medication Route  . triamcinolone acetonide (KENALOG-40) injection 4 mg Intravitreal      REVIEW OF SYSTEMS: ROS    Positive for: Eyes   Negative for: Constitutional, Gastrointestinal, Neurological, Skin, Genitourinary, Musculoskeletal, HENT, Endocrine, Cardiovascular, Respiratory, Psychiatric, Allergic/Imm, Heme/Lymph  Last edited by Corrinne EagleEnglish, Ashley L on 04/27/2019  9:30 AM. (History)       ALLERGIES Allergies  Allergen Reactions  . Atorvastatin Other (See Comments)    Muscle aches/ memory loss  . Penicillins Itching and Nausea Only  . Sulfa Antibiotics Nausea Only    PAST MEDICAL HISTORY Past Medical History:  Diagnosis Date  . Anxiety   . Arthritis   . GERD (gastroesophageal reflux disease)   . History of palpitations   . Hyperlipidemia   . MVP (mitral valve prolapse)   . PONV (postoperative nausea and  vomiting)    No problems in 08/2018; however has had previous problems  . Pre-diabetes   . Retinal detachment    OS   Past Surgical History:  Procedure Laterality Date  . ABDOMINAL HYSTERECTOMY  1970's  . CATARACT EXTRACTION    . EYE SURGERY Bilateral   . GAS INSERTION Right 11/26/2014   Procedure: INSERTION OF GAS;  Surgeon: Sherrie GeorgeJohn D Matthews, MD;  [facility];  Service: Ophthalmology;  Laterality: Right;  C3F8  . GAS/FLUID EXCHANGE Left 08/21/2018   Procedure: GAS/FLUID EXCHANGE;  Surgeon: Rennis ChrisZamora, Mickael Mcnutt, MD;  [facility];  Service: Ophthalmology;  Laterality: Left;  C3F8  . KNEE ARTHROSCOPY Left   . PHOTOCOAGULATION WITH LASER Right 11/26/2014   Procedure: PHOTOCOAGULATION WITH LASER;  Surgeon: Sherrie GeorgeJohn D Matthews, MD;  [facility];  Service: Ophthalmology;  Laterality: Right;  . PHOTOCOAGULATION WITH LASER Left 08/21/2018   Procedure: PHOTOCOAGULATION WITH LASER;  Surgeon: Rennis ChrisZamora, Nathanyel Defenbaugh, MD;  [facility];  Service: Ophthalmology;  Laterality: Left;  . REPAIR OF COMPLEX TRACTION RETINAL DETACHMENT Left 12/06/2018   Procedure: REPAIR OF COMPLEX TRACTION RETINAL DETACHMENT, 25 gauge vitrectomy, endolaser photocoagulation, memebrane peel, perfluoron injection,  and silicone oil;  Surgeon: Rennis ChrisZamora, Kareem Cathey, MD;  [facility];  Service: Ophthalmology;  Laterality: Left;  . RETINAL DETACHMENT SURGERY Left 08/21/2018   SBP - Dr. Sherryl MangesBrain Satvik Parco  . SCLERAL BUCKLE Right 11/26/2014   Procedure: SCLERAL BUCKLE RIGHT EYE ;  Surgeon: Sherrie GeorgeJohn D Matthews, MD;  [facility];  Service: Ophthalmology;  Laterality: Right;  . SCLERAL BUCKLE WITH POSSIBLE 25 GAUGE PARS PLANA VITRECTOMY Left 08/21/2018   Procedure: SCLERAL BUCKLE WITH 25 GAUGE PARS PLANA VITRECTOMY;  Surgeon: Rennis ChrisZamora, Loretta Kluender, MD;  [facility];  Service: Ophthalmology;  Laterality: Left;    FAMILY HISTORY Family History  Problem Relation Age of Onset  . Cancer Mother   . Heart failure Father     SOCIAL HISTORY Social History    Tobacco Use  . Smoking status: Former Smoker    Years: 10.00  . Smokeless tobacco: Never Used  . Tobacco comment: quit in 1970's  Substance Use Topics  . Alcohol use: No  . Drug use: No         OPHTHALMIC EXAM:  Base Eye Exam    Visual Acuity (Snellen - Linear)      Right Left   Dist cc 20/40 20/80 +1   Dist ph cc NI 20/40 -2       Tonometry (Tonopen, 9:34 AM)      Right Left   Pressure 13 14       Pupils      Dark Light Shape React APD   Right 4 3 Round Brisk 0   Left 5 4 Round Brisk 0       Extraocular Movement      Right Left    Full Full  Neuro/Psych    Oriented x3: Yes   Mood/Affect: Normal       Dilation    Both eyes: 1.0% Mydriacyl, 2.5% Phenylephrine @ 9:34 AM        Slit Lamp and Fundus Exam    External Exam      Right Left   External  Periorbital edema and ecchymoses -- improved       Slit Lamp Exam      Right Left   Lids/Lashes Mild Meibomian gland dysfunction, Telangiectasia Ptosis, Dermatochalasis - upper lid   Conjunctiva/Sclera post surgical changes, healed nicely Mild Injection temporally, residual STK ST quad   Cornea 1-2+ Punctate epithelial erosions, Temporal Well healed cataract wounds, Debris in tear film 1+ inferior Punctate epithelial erosions, mild Debris in tear film   Anterior Chamber Deep and quiet Deep and quiet   Iris Round and dilated Round and dilated   Lens PC IOL in good position, trace PCO PC IOL in good position, trace Posterior capsular opacification   Vitreous Vitreous syneresis, mild pigment in anterior vitreous, Posterior vitreous detachment; scattered vitreous debris Post vitrectomy, silicon oil -- good fill       Fundus Exam      Right Left   Disc Pink and Sharp Sharp rim, mild tilt, temporal pallor   C/D Ratio 0.2 0.2   Macula Blunted foveal reflex, Retinal pigment epithelial mottling, ERM, No heme or edema Flat under oil, Good foveal reflex under oil, no heme   Vessels Vascular attenuation  Vascular attenuation   Periphery Attached over scleral buckle, good buckle height, good scarring over buckle good buckle height; good laser surrounding buckle; improved fibrosis; retina attached under oil, retinotomy from 0300-0430 with good laser surrounding        Refraction    Wearing Rx      Sphere Cylinder Axis Add   Right -1.00 Sphere  +2.50   Left [place] +1.00 025 +2.50          IMAGING AND PROCEDURES  Imaging and Procedures for @TODAY @  OCT, Retina - OU - Both Eyes       Right Eye Quality was good. Central Foveal Thickness: 297. Progression has been stable. Findings include normal foveal contour, no IRF, no SRF, epiretinal membrane.   Left Eye Quality was good. Central Foveal Thickness: 257. Progression has improved. Findings include epiretinal membrane, normal foveal contour, no SRF, no IRF (Trace cystic change temporal fovea).   Notes *Images captured and stored on drive  Diagnosis / Impression:  OD: mild ERM; NFP; no IRF/SRF OS: retina reattached; no IRF/SRF; CME stably resolved; Trace cystic change temporal fovea  Clinical management:  See below  Abbreviations: NFP - Normal foveal profile. CME - cystoid macular edema. PED - pigment epithelial detachment. IRF - intraretinal fluid. SRF - subretinal fluid. EZ - ellipsoid zone. ERM - epiretinal membrane. ORA - outer retinal atrophy. ORT - outer retinal tubulation. SRHM - subretinal hyper-reflective material                 ASSESSMENT/PLAN:    ICD-10-CM   1. Left retinal detachment  H33.22   2. 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 Retinal edema  H35.81 OCT, Retina - OU - Both Eyes  5. CME (cystoid macular edema), left  H35.352   6. History of retinal detachment  Z86.69   7. Pseudophakia of both eyes  Z96.1     1-4. Retinal  detachment, OS  - originally: macula-sparing inferior retinal detachment from 3-6 oclock  - large HST at 0500 and 2 small  tears at 300 within the detached retina  - s/p SBP + PPV/PFC/EL/FAX/14% C3F8 OS, 10.07.19   - progressive fibrosis/PVR just posterior to buckle around 0400 with +SRF tracking posterior to buckle -- focal tractional/PVR detachment OS  - s/p PPV/MP/PFO/retinotomy/EL/1000cs silicon oil OS, 1.22.2020             - retina attached and in good position -- good buckle height and laser around retinotomy  - OCT shows interval resolution of CME             - IOP okay at 14 today  - cont Cosopt BID OS             - cont PF taper -- 1 drop daily  - discussed surgery for silicon oil removal and RBA of procedure             - pt wishes to proceed with surgery on May 24, 2019  - will post case for July 9, but pt to return week of June 29 for f/u and completion of pre op paperwork, informed consent, etc.  5. CME OS  - post-op CME  - initially treated w/ PF and ketorolac -- had interval increase in CME  - s/p sub-tenons kenalog (03.13.20) -- remains resolved today  - continue PF q daily as above  6. History of retinal detachment OD  - S/P SBP OD 11/26/14 -- Dr. Alan MulderJohn Matthews  - looks great -- retina in great position  - monitor  7. Pseudophakia OU  - s/p CE/IOL OU ([place])  - doing well  - monitor   Ophthalmic Meds Ordered this visit:  No orders of the defined types were placed in this encounter.      Return for f/u week of June 29, POV.  There are no Patient Instructions on file for this visit.   Explained the diagnoses, plan, and follow up with the patient and they expressed understanding.  Patient expressed understanding of the importance of proper follow up care.   This document serves as a record of services personally performed by Karie ChimeraBrian G. Orena Cavazos, MD, PhD. It was created on their behalf by Laurian BrimAmanda Brown, OA, an ophthalmic assistant. The creation of this record is the provider's dictation and/or activities during the visit.    Electronically signed by: Laurian BrimAmanda Brown, OA  06.10.2020 12:23  AM     Karie ChimeraBrian G. Jisel Fleet, M.D., Ph.D. Diseases & Surgery of the Retina and Vitreous [facility]  I have reviewed the above documentation for accuracy and completeness, and I agree with the above. Karie ChimeraBrian G. Kierah Goatley, M.D., Ph.D. 05/01/19 12:27 AM     Abbreviations: M myopia (nearsighted); A astigmatism; H hyperopia (farsighted); P presbyopia; Mrx spectacle prescription;  CTL contact lenses; OD right eye; OS left eye; OU both eyes  XT exotropia; ET esotropia; PEK punctate epithelial keratitis; PEE punctate epithelial erosions; DES dry eye syndrome; MGD meibomian gland dysfunction; ATs artificial tears; PFAT's preservative free artificial tears; NSC nuclear sclerotic cataract; PSC posterior subcapsular cataract; ERM epi-retinal membrane; PVD posterior vitreous detachment; RD retinal detachment; DM diabetes mellitus; DR diabetic retinopathy; NPDR non-proliferative diabetic retinopathy; PDR proliferative diabetic retinopathy; CSME clinically significant macular edema; DME diabetic macular edema; dbh dot blot hemorrhages; CWS cotton wool spot; POAG primary open angle glaucoma; C/D cup-to-disc ratio; HVF humphrey visual field; GVF goldmann visual field; OCT optical  coherence tomography; IOP intraocular pressure; BRVO Branch retinal vein occlusion; CRVO central retinal vein occlusion; CRAO central retinal artery occlusion; BRAO branch retinal artery occlusion; RT retinal tear; SB scleral buckle; PPV pars plana vitrectomy; VH Vitreous hemorrhage; PRP panretinal laser photocoagulation; IVK intravitreal kenalog; VMT vitreomacular traction; MH Macular hole;  NVD neovascularization of the disc; NVE neovascularization elsewhere; AREDS age related eye disease study; ARMD age related macular degeneration; POAG primary open angle glaucoma; EBMD epithelial/anterior basement membrane dystrophy; ACIOL anterior chamber intraocular lens; IOL intraocular lens; PCIOL posterior chamber intraocular lens;  Phaco/IOL phacoemulsification with intraocular lens placement; [place] photorefractive keratectomy; LASIK laser assisted in situ keratomileusis; HTN hypertension; DM diabetes mellitus; COPD chronic obstructive pulmonary disease

## 2019-04-27 ENCOUNTER — Other Ambulatory Visit: Payer: Self-pay

## 2019-04-27 ENCOUNTER — Ambulatory Visit (INDEPENDENT_AMBULATORY_CARE_PROVIDER_SITE_OTHER): Payer: Medicare Other | Admitting: Ophthalmology

## 2019-04-27 DIAGNOSIS — H40052 Ocular hypertension, left eye: Secondary | ICD-10-CM

## 2019-04-27 DIAGNOSIS — H3342 Traction detachment of retina, left eye: Secondary | ICD-10-CM

## 2019-04-27 DIAGNOSIS — Z961 Presence of intraocular lens: Secondary | ICD-10-CM

## 2019-04-27 DIAGNOSIS — H3581 Retinal edema: Secondary | ICD-10-CM | POA: Diagnosis not present

## 2019-04-27 DIAGNOSIS — H3322 Serous retinal detachment, left eye: Secondary | ICD-10-CM

## 2019-04-27 DIAGNOSIS — Z8669 Personal history of other diseases of the nervous system and sense organs: Secondary | ICD-10-CM

## 2019-04-27 DIAGNOSIS — H35352 Cystoid macular degeneration, left eye: Secondary | ICD-10-CM

## 2019-05-01 ENCOUNTER — Encounter (INDEPENDENT_AMBULATORY_CARE_PROVIDER_SITE_OTHER): Payer: Self-pay | Admitting: Ophthalmology

## 2019-05-03 ENCOUNTER — Other Ambulatory Visit: Payer: Self-pay

## 2019-05-03 ENCOUNTER — Encounter (INDEPENDENT_AMBULATORY_CARE_PROVIDER_SITE_OTHER): Payer: Medicare Other | Admitting: Ophthalmology

## 2019-05-03 DIAGNOSIS — H338 Other retinal detachments: Secondary | ICD-10-CM

## 2019-05-09 NOTE — Progress Notes (Addendum)
[facility] - Clinic Note  05/10/2019     CHIEF COMPLAINT Patient presents for Post-op Follow-up   HISTORY OF PRESENT ILLNESS: Carla May is a 78 y.o. female who presents to the clinic today for:   HPI    Post-op Follow-up    In left eye.  I, the attending physician,  performed the HPI with the patient and updated documentation appropriately.          Comments    78 y/o female pt here for 2 wk f/u.  S/p SBP + PPV/PFC/EL/FAX/14% C3F8 OS on 10.17.19.  S/p PPV/MP/PFO/retinotomy/EL/1000cs silicon oil OS on 01.22.20.  Pt having sx for silicon oil removal on 07.09.20.  Saw Dr. Ashley RoyaltyMatthews 1 wk ago for intense pain OS.  Dr. Ashley RoyaltyMatthews put her on Prolensa and Durezol.  Pain has improved a bit, but still at about a 4 today.  Cosopt bid OS, Prolensa bid OS, Durezol tid OS.       Last edited by Rennis ChrisZamora, Shawnetta Lein, MD on 05/11/2019  8:21 AM. (History)    pt states she saw Dr. Ashley RoyaltyMatthews last week for eye pain, she states he gave her Durezol and Prolensa and she feels like they helped a little bit, she state she is also using a pressure drop, she states her eye still hurts, but nothing like it did a couple weeks ago, pt would still like to move forward with surgery to remove the silicone oil in her left eye on July 9  Referring physician: Maurice SmallGriffin, Elaine, MD [address] [place],  [place] [id number]  HISTORICAL INFORMATION:   Selected notes from the MEDICAL RECORD NUMBER Referred by Dr. Ashok CordiaMaria Johnson for concern of retinal hole LEE: Josefa Half(M. Johnson) [BCVA: OD: OS:] Ocular Hx- PMH-anxiety, arthritis, HLD    CURRENT MEDICATIONS: Current Outpatient Medications (Ophthalmic Drugs)  Medication Sig  . dorzolamide-timolol (COSOPT) 22.3-6.8 MG/ML ophthalmic solution Place 1 drop into the left eye daily.  . DUREZOL 0.05 % EMUL INSTILL 1 DROP IN THE LEFT EYE THREE TIMES A DAY  . PROLENSA 0.07 % SOLN INSTILL 1 DROP IN THE LEFT EYE 2 TIMES A DAY  . bacitracin-polymyxin b  (POLYSPORIN) ophthalmic ointment Place 1 application into the left eye 4 (four) times daily. Apply 1/4 inch ribbon to left eye four times a day (Patient not taking: Reported on 05/10/2019)  . carboxymethylcellulose (REFRESH PLUS) 0.5 % SOLN Place 1 drop into both eyes 3 (three) times daily as needed (dry eyes).   Marland Kitchen. gatifloxacin (ZYMAXID) 0.5 % SOLN Place 1 drop into the left eye 4 (four) times daily. (Patient not taking: Reported on 05/10/2019)  . ketorolac (ACULAR) 0.5 % ophthalmic solution Place 1 drop into the left eye 4 (four) times daily. (Patient not taking: Reported on 05/10/2019)  . prednisoLONE acetate (PRED FORTE) 1 % ophthalmic suspension Place 1 drop into the left eye 4 (four) times daily. (Patient not taking: Reported on 05/10/2019)   No current facility-administered medications for this visit.  (Ophthalmic Drugs)   Current Outpatient Medications (Other)  Medication Sig  . acetaminophen (TYLENOL ARTHRITIS PAIN) 650 MG CR tablet Take 1,300 mg by mouth every 8 (eight) hours as needed for pain.  Marland Kitchen. aspirin EC 81 MG tablet Take 81 mg by mouth daily.  Marland Kitchen. HYDROcodone-acetaminophen (NORCO/VICODIN) 5-325 MG tablet Take 1 tablet by mouth every 4 (four) hours as needed for moderate pain.  Marland Kitchen. ibuprofen (ADVIL) 600 MG tablet TAKE 1 TABLET BY MOUTH EVERY 6 8 HOURS  AS NEEDED FOR PAIN  . Multiple Vitamin (MULTIVITAMIN WITH MINERALS) TABS tablet Take 1 tablet by mouth daily. One-A-Day Multivitamin  . nabumetone (RELAFEN) 500 MG tablet Take 500 mg by mouth 2 (two) times daily as needed.  . Omega-3 Fatty Acids (FISH OIL) 1000 MG CAPS Take 1,000 mg by mouth daily.  Marland Kitchen. omeprazole (PRILOSEC OTC) 20 MG tablet Take 20 mg by mouth daily.  . traMADol (ULTRAM) 50 MG tablet Take 50-100 mg by mouth every 6 (six) hours as needed. for pain   Current Facility-Administered Medications (Other)  Medication Route  . triamcinolone acetonide (KENALOG-40) injection 4 mg Intravitreal      REVIEW OF SYSTEMS: ROS     Positive for: Gastrointestinal, Musculoskeletal, Eyes   Negative for: Constitutional, Neurological, Skin, Genitourinary, HENT, Endocrine, Cardiovascular, Respiratory, Psychiatric, Allergic/Imm, Heme/Lymph   Last edited by Celine MansBaxley, Andrew G, COA on 05/10/2019  8:26 AM. (History)       ALLERGIES Allergies  Allergen Reactions  . Atorvastatin Other (See Comments)    Muscle aches/ memory loss  . Penicillins Itching and Nausea Only  . Sulfa Antibiotics Nausea Only    PAST MEDICAL HISTORY Past Medical History:  Diagnosis Date  . Anxiety   . Arthritis   . GERD (gastroesophageal reflux disease)   . History of palpitations   . Hyperlipidemia   . MVP (mitral valve prolapse)   . PONV (postoperative nausea and vomiting)    No problems in 08/2018; however has had previous problems  . Pre-diabetes   . Retinal detachment    OS   Past Surgical History:  Procedure Laterality Date  . ABDOMINAL HYSTERECTOMY  1970's  . CATARACT EXTRACTION    . EYE SURGERY Bilateral   . GAS INSERTION Right 11/26/2014   Procedure: INSERTION OF GAS;  Surgeon: Sherrie GeorgeJohn D Matthews, MD;  [facility];  Service: Ophthalmology;  Laterality: Right;  C3F8  . GAS/FLUID EXCHANGE Left 08/21/2018   Procedure: GAS/FLUID EXCHANGE;  Surgeon: Rennis ChrisZamora, Iram Lundberg, MD;  [facility];  Service: Ophthalmology;  Laterality: Left;  C3F8  . KNEE ARTHROSCOPY Left   . PHOTOCOAGULATION WITH LASER Right 11/26/2014   Procedure: PHOTOCOAGULATION WITH LASER;  Surgeon: Sherrie GeorgeJohn D Matthews, MD;  [facility];  Service: Ophthalmology;  Laterality: Right;  . PHOTOCOAGULATION WITH LASER Left 08/21/2018   Procedure: PHOTOCOAGULATION WITH LASER;  Surgeon: Rennis ChrisZamora, Loni Abdon, MD;  [facility];  Service: Ophthalmology;  Laterality: Left;  . REPAIR OF COMPLEX TRACTION RETINAL DETACHMENT Left 12/06/2018   Procedure: REPAIR OF COMPLEX TRACTION RETINAL DETACHMENT, 25 gauge vitrectomy, endolaser photocoagulation, memebrane peel, perfluoron injection,  and silicone  oil;  Surgeon: Rennis ChrisZamora, Neeko Pharo, MD;  [facility];  Service: Ophthalmology;  Laterality: Left;  . RETINAL DETACHMENT SURGERY Left 08/21/2018   SBP - Dr. Sherryl MangesBrain Kamuela Magos  . SCLERAL BUCKLE Right 11/26/2014   Procedure: SCLERAL BUCKLE RIGHT EYE ;  Surgeon: Sherrie GeorgeJohn D Matthews, MD;  [facility];  Service: Ophthalmology;  Laterality: Right;  . SCLERAL BUCKLE WITH POSSIBLE 25 GAUGE PARS PLANA VITRECTOMY Left 08/21/2018   Procedure: SCLERAL BUCKLE WITH 25 GAUGE PARS PLANA VITRECTOMY;  Surgeon: Rennis ChrisZamora, Jashira Cotugno, MD;  [facility];  Service: Ophthalmology;  Laterality: Left;    FAMILY HISTORY Family History  Problem Relation Age of Onset  . Cancer Mother   . Heart failure Father     SOCIAL HISTORY Social History   Tobacco Use  . Smoking status: Former Smoker    Years: 10.00  . Smokeless tobacco: Never Used  .  Tobacco comment: quit in 1970's  Substance Use Topics  . Alcohol use: No  . Drug use: No         OPHTHALMIC EXAM:  Base Eye Exam    Visual Acuity (Snellen - Linear)      Right Left   Dist cc 20/40 20/100   Dist ph cc 20/30 20/70   Correction: Glasses       Tonometry (Tonopen, 8:28 AM)      Right Left   Pressure 14 15       Pupils      Dark Light Shape React APD   Right 4 3 Round Brisk None   Left 5 4 Round Minimal None       Visual Fields (Counting fingers)      Left Right    Full Full       Extraocular Movement      Right Left    Full, Ortho Full, Ortho       Neuro/Psych    Oriented x3: Yes   Mood/Affect: Normal       Dilation    Both eyes: 1.0% Mydriacyl, 2.5% Phenylephrine @ 8:28 AM        Slit Lamp and Fundus Exam    External Exam      Right Left   External  Periorbital edema and ecchymoses -- improved       Slit Lamp Exam      Right Left   Lids/Lashes Mild Meibomian gland dysfunction, Telangiectasia, Ptosis Ptosis, Dermatochalasis - upper lid   Conjunctiva/Sclera post surgical changes, nodular scleritis  Mild Injection temporally,  residual STK ST quad   Cornea 1-2+ Punctate epithelial erosions, Temporal Well healed cataract wounds, Debris in tear film 1+ inferior Punctate epithelial erosions, mild Debris in tear film   Anterior Chamber Deep and quiet Deep and quiet   Iris Round and dilated Round and dilated   Lens PC IOL in good position, trace PCO PC IOL in good position, trace Posterior capsular opacification   Vitreous Vitreous syneresis, mild pigment in anterior vitreous, Posterior vitreous detachment; scattered vitreous debris Post vitrectomy, silicon oil -- good fill       Fundus Exam      Right Left   Disc Pink and Sharp Sharp rim, mild tilt, temporal pallor   C/D Ratio 0.2 0.2   Macula Blunted foveal reflex, Retinal pigment epithelial mottling, ERM, No heme or edema Flat under oil, Good foveal reflex under oil, no heme   Vessels Vascular attenuation Vascular attenuation   Periphery Attached over scleral buckle, good buckle height, good scarring over buckle good buckle height; good laser surrounding buckle; improved fibrosis; retina attached under oil, retinotomy from 0300-0430 with good laser surrounding          IMAGING AND PROCEDURES  Imaging and Procedures for @TODAY @  OCT, Retina - OU - Both Eyes       Right Eye Quality was good. Central Foveal Thickness: 290. Progression has been stable. Findings include normal foveal contour, no IRF, no SRF, epiretinal membrane.   Left Eye Quality was good. Central Foveal Thickness: 251. Progression has improved. Findings include epiretinal membrane, normal foveal contour, no SRF, no IRF (Interval improvement in cystic change temporal fovea).   Notes *Images captured and stored on drive  Diagnosis / Impression:  OD: mild ERM; NFP; no IRF/SRF OS: retina reattached; no IRF/SRF; interval improvement in cystic change temporal fovea  Clinical management:  See below  Abbreviations: NFP - Normal foveal profile.  CME - cystoid macular edema. PED - pigment  epithelial detachment. IRF - intraretinal fluid. SRF - subretinal fluid. EZ - ellipsoid zone. ERM - epiretinal membrane. ORA - outer retinal atrophy. ORT - outer retinal tubulation. SRHM - subretinal hyper-reflective material                 ASSESSMENT/PLAN:    ICD-10-CM   1. Left retinal detachment  H33.22   2. 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 Retinal edema  H35.81 OCT, Retina - OU - Both Eyes  6. CME (cystoid macular edema), left  H35.352   7. History of retinal detachment  Z86.69   8. Pseudophakia of both eyes  Z96.1     1-3. Retinal detachment, OS  - originally: macula-sparing inferior retinal detachment from 3-6 oclock  - large HST at 0500 and 2 small tears at 300 within the detached retina  - s/p SBP + PPV/PFC/EL/FAX/14% C3F8 OS, 10.07.19   - progressive fibrosis/PVR just posterior to buckle around 0400 with +SRF tracking posterior to buckle -- focal tractional/PVR detachment OS  - s/p ZOX/WR/UEA/[id number] silicon oil OS, 3.08.6578             - retina attached and in good position -- good buckle height and laser around retinotomy  - OCT shows interval resolution of CME             - IOP okay at 15 today  - cont Cosopt BID OS             - cont PF taper -- 1 drop daily  - cont Durezol / Prolensa as prescribed by Dr. Zigmund Daniel  - add Ibuprofen 800 mg TID  - discussed surgery for silicon oil removal and RBA of procedure             - pt wishes to proceed with surgery on May 24, 2019  - RBA of procedure discussed, questions answered  - informed consent obtained and signed  - f/u next Friday, July 3 -- f/u nodular scleritis  4. Nodular scleritis OS - sup temporal quadrant - some mild improvement on topical durezol and prolensa- started by Dr. Zigmund Daniel last week -- continue - start po Ibuprofen 800 mg TID - pt already on prilosec for GI prophylaxis - f/u  Friday, July 3rd  5,6. CME OS  - post-op CME  - initially treated w/ PF and ketorolac -- had interval increase in CME  - s/p sub-tenons kenalog (03.13.20) -- remains resolved today  - continue durezol and prolensa as above  7. History of retinal detachment OD  - S/P SBP OD 11/26/14 -- Dr. Tempie Hoist  - looks great -- retina in great position  - monitor  8. Pseudophakia OU  - s/p CE/IOL OU (Bevis)  - doing well  - monitor   Ophthalmic Meds Ordered this visit:  No orders of the defined types were placed in this encounter.      Return in about 8 days (around 05/18/2019).  There are no Patient Instructions on file for this visit.   Explained the diagnoses, plan, and follow up with the patient and they expressed understanding.  Patient expressed understanding of the importance of proper follow up care.   This document serves as a record of services personally performed by Gardiner Sleeper, MD, PhD. It was created on their behalf  by Laurian Brim, OA, an ophthalmic assistant. The creation of this record is the provider's dictation and/or activities during the visit.    Electronically signed by: Laurian Brim, OA  06.24.2020 8:22 AM    Karie Chimera, M.D., Ph.D. Diseases & Surgery of the Retina and Vitreous [facility]  I have reviewed the above documentation for accuracy and completeness, and I agree with the above. Karie Chimera, M.D., Ph.D. 05/11/19 8:23 AM    Abbreviations: M myopia (nearsighted); A astigmatism; H hyperopia (farsighted); P presbyopia; Mrx spectacle prescription;  CTL contact lenses; OD right eye; OS left eye; OU both eyes  XT exotropia; ET esotropia; PEK punctate epithelial keratitis; PEE punctate epithelial erosions; DES dry eye syndrome; MGD meibomian gland dysfunction; ATs artificial tears; PFAT's preservative free artificial tears; NSC nuclear sclerotic cataract; PSC posterior subcapsular cataract; ERM epi-retinal membrane; PVD  posterior vitreous detachment; RD retinal detachment; DM diabetes mellitus; DR diabetic retinopathy; NPDR non-proliferative diabetic retinopathy; PDR proliferative diabetic retinopathy; CSME clinically significant macular edema; DME diabetic macular edema; dbh dot blot hemorrhages; CWS cotton wool spot; POAG primary open angle glaucoma; C/D cup-to-disc ratio; HVF humphrey visual field; GVF goldmann visual field; OCT optical coherence tomography; IOP intraocular pressure; BRVO Branch retinal vein occlusion; CRVO central retinal vein occlusion; CRAO central retinal artery occlusion; BRAO branch retinal artery occlusion; RT retinal tear; SB scleral buckle; PPV pars plana vitrectomy; VH Vitreous hemorrhage; PRP panretinal laser photocoagulation; IVK intravitreal kenalog; VMT vitreomacular traction; MH Macular hole;  NVD neovascularization of the disc; NVE neovascularization elsewhere; AREDS age related eye disease study; ARMD age related macular degeneration; POAG primary open angle glaucoma; EBMD epithelial/anterior basement membrane dystrophy; ACIOL anterior chamber intraocular lens; IOL intraocular lens; PCIOL posterior chamber intraocular lens; Phaco/IOL phacoemulsification with intraocular lens placement; PRK photorefractive keratectomy; LASIK laser assisted in situ keratomileusis; HTN hypertension; DM diabetes mellitus; COPD chronic obstructive pulmonary disease

## 2019-05-10 ENCOUNTER — Encounter (INDEPENDENT_AMBULATORY_CARE_PROVIDER_SITE_OTHER): Payer: Self-pay | Admitting: Ophthalmology

## 2019-05-10 ENCOUNTER — Ambulatory Visit (INDEPENDENT_AMBULATORY_CARE_PROVIDER_SITE_OTHER): Payer: Medicare Other | Admitting: Ophthalmology

## 2019-05-10 ENCOUNTER — Other Ambulatory Visit: Payer: Self-pay

## 2019-05-10 DIAGNOSIS — H15092 Other scleritis, left eye: Secondary | ICD-10-CM

## 2019-05-10 DIAGNOSIS — H3322 Serous retinal detachment, left eye: Secondary | ICD-10-CM

## 2019-05-10 DIAGNOSIS — Z8669 Personal history of other diseases of the nervous system and sense organs: Secondary | ICD-10-CM

## 2019-05-10 DIAGNOSIS — H3581 Retinal edema: Secondary | ICD-10-CM | POA: Diagnosis not present

## 2019-05-10 DIAGNOSIS — H40052 Ocular hypertension, left eye: Secondary | ICD-10-CM | POA: Diagnosis not present

## 2019-05-10 DIAGNOSIS — H35352 Cystoid macular degeneration, left eye: Secondary | ICD-10-CM

## 2019-05-10 DIAGNOSIS — Z961 Presence of intraocular lens: Secondary | ICD-10-CM

## 2019-05-10 DIAGNOSIS — H3342 Traction detachment of retina, left eye: Secondary | ICD-10-CM

## 2019-05-14 ENCOUNTER — Encounter (INDEPENDENT_AMBULATORY_CARE_PROVIDER_SITE_OTHER): Payer: Medicare Other | Admitting: Ophthalmology

## 2019-05-17 NOTE — Progress Notes (Addendum)
[facility] Note  05/18/2019     CHIEF COMPLAINT Patient presents for Retina Evaluation   HISTORY OF PRESENT ILLNESS: Carla May is a 78 y.o. female who presents to the clinic today for:   HPI    Retina Evaluation    In left eye.  This started weeks ago.  Duration of weeks.  Context:  distance vision.  I, the attending physician,  performed the HPI with the patient and updated documentation appropriately.          Comments    Nodular Scleritis OS.  Patient states her vision is the same in her left eye as last visit.  Patient denies eye pain or discomfort and denies any new or worsening floaters or fol OU.       Last edited by Bernarda Caffey, MD on 05/18/2019  9:40 AM. (History)    Patient states she has history of arthritis. Eye pain/discomfort has improved  Referring physician: Kelton Pillar, MD [address] [place],  [place] [id number]  HISTORICAL INFORMATION:   Selected notes from the MEDICAL RECORD NUMBER Referred by Dr. Len Blalock for concern of retinal hole LEE: Mellody Memos) [BCVA: OD: OS:] Ocular Hx- PMH-anxiety, arthritis, HLD    CURRENT MEDICATIONS: Current Outpatient Medications (Ophthalmic Drugs)  Medication Sig  . carboxymethylcellulose (REFRESH PLUS) 0.5 % SOLN Place 1 drop into both eyes 3 (three) times daily as needed (for dry eyes).   . dorzolamide-timolol (COSOPT) 22.3-6.8 MG/ML ophthalmic solution Place 1 drop into the left eye 2 (two) times daily.  . DUREZOL 0.05 % EMUL Place 1 drop into the left eye 3 (three) times daily.  Marland Kitchen PROLENSA 0.07 % SOLN Place 1 drop into the left eye 2 (two) times a day.   No current facility-administered medications for this visit.  (Ophthalmic Drugs)   Current Outpatient Medications (Other)  Medication Sig  . aspirin EC 81 MG tablet Take 81 mg by mouth daily.  Marland Kitchen ibuprofen (ADVIL) 800 MG tablet Take 800 mg by mouth every 8 (eight) hours as needed for headache or moderate  pain.   . Multiple Vitamin (MULTIVITAMIN WITH MINERALS) TABS tablet Take 1 tablet by mouth daily. One-A-Day Multivitamin  . Omega-3 Fatty Acids (FISH OIL PO) Take 1,400 mg by mouth daily.   Marland Kitchen omeprazole (PRILOSEC OTC) 20 MG tablet Take 20 mg by mouth daily.   Current Facility-Administered Medications (Other)  Medication Route  . triamcinolone acetonide (KENALOG-40) injection 4 mg Intravitreal      REVIEW OF SYSTEMS: ROS    Positive for: Gastrointestinal, Musculoskeletal, Eyes   Negative for: Constitutional, Neurological, Skin, Genitourinary, HENT, Endocrine, Cardiovascular, Respiratory, Psychiatric, Allergic/Imm, Heme/Lymph   Last edited by Doneen Poisson on 05/18/2019  8:27 AM. (History)       ALLERGIES Allergies  Allergen Reactions  . Atorvastatin Other (See Comments)    Muscle aches/ memory loss  . Penicillins Itching, Nausea Only and Other (See Comments)    Did it involve swelling of the face/tongue/throat, SOB, or low BP? Unknown Did it involve sudden or severe rash/hives, skin peeling, or any reaction on the inside of your mouth or nose? Itching Did you need to seek medical attention at a hospital or doctor's office? No When did it last happen? 1970s If all above answers are "NO", may proceed with cephalosporin use.   . Sulfa Antibiotics Nausea Only    PAST MEDICAL HISTORY Past Medical History:  Diagnosis Date  . Anxiety   .  Arthritis   . GERD (gastroesophageal reflux disease)   . History of palpitations   . Hyperlipidemia   . MVP (mitral valve prolapse)   . PONV (postoperative nausea and vomiting)    No problems in 08/2018; however has had previous problems  . Pre-diabetes   . Retinal detachment    OS   Past Surgical History:  Procedure Laterality Date  . ABDOMINAL HYSTERECTOMY  1970's  . CATARACT EXTRACTION    . EYE SURGERY Bilateral   . GAS INSERTION Right 11/26/2014   Procedure: INSERTION OF GAS;  Surgeon: Hayden Pedro, MD;  Location: [facility];   Service: Ophthalmology;  Laterality: Right;  C3F8  . GAS/FLUID EXCHANGE Left 08/21/2018   Procedure: GAS/FLUID EXCHANGE;  Surgeon: Bernarda Caffey, MD;  Location: [facility];  Service: Ophthalmology;  Laterality: Left;  C3F8  . KNEE ARTHROSCOPY Left   . PHOTOCOAGULATION WITH LASER Right 11/26/2014   Procedure: PHOTOCOAGULATION WITH LASER;  Surgeon: Hayden Pedro, MD;  Location: [facility];  Service: Ophthalmology;  Laterality: Right;  . PHOTOCOAGULATION WITH LASER Left 08/21/2018   Procedure: PHOTOCOAGULATION WITH LASER;  Surgeon: Bernarda Caffey, MD;  Location: [facility];  Service: Ophthalmology;  Laterality: Left;  . REPAIR OF COMPLEX TRACTION RETINAL DETACHMENT Left 12/06/2018   Procedure: REPAIR OF COMPLEX TRACTION RETINAL DETACHMENT, 25 gauge vitrectomy, endolaser photocoagulation, memebrane peel, perfluoron injection,  and silicone oil;  Surgeon: Bernarda Caffey, MD;  Location: [facility];  Service: Ophthalmology;  Laterality: Left;  . RETINAL DETACHMENT SURGERY Left 08/21/2018   SBP - Dr. Adonis Housekeeper  . SCLERAL BUCKLE Right 11/26/2014   Procedure: SCLERAL BUCKLE RIGHT EYE ;  Surgeon: Hayden Pedro, MD;  Location: [facility];  Service: Ophthalmology;  Laterality: Right;  . SCLERAL BUCKLE WITH POSSIBLE 25 GAUGE PARS PLANA VITRECTOMY Left 08/21/2018   Procedure: SCLERAL BUCKLE WITH 25 GAUGE PARS PLANA VITRECTOMY;  Surgeon: Bernarda Caffey, MD;  Location: [facility];  Service: Ophthalmology;  Laterality: Left;    FAMILY HISTORY Family History  Problem Relation Age of Onset  . Cancer Mother   . Heart failure Father     SOCIAL HISTORY Social History   Tobacco Use  . Smoking status: Former Smoker    Years: 10.00  . Smokeless tobacco: Never Used  . Tobacco comment: quit in 1970's  Substance Use Topics  . Alcohol use: No  . Drug use: No         OPHTHALMIC EXAM:  Base Eye Exam    Visual Acuity (Snellen - Linear)      Right Left   Dist cc 20/40 -1 20/100 -2   Dist ph cc 20/40 +1 20/70 -2   Correction: Glasses        Tonometry (Tonopen, 8:31 AM)      Right Left   Pressure 13 14       Pupils      Dark Light Shape React APD   Right 4 3 Round Brisk 0   Left 5 5 Round Minimal 0       Extraocular Movement      Right Left    Full Full       Neuro/Psych    Oriented x3: Yes   Mood/Affect: Normal       Dilation    Both eyes: 1.0% Mydriacyl, 2.5% Phenylephrine @ 8:31 AM        Slit Lamp and Fundus Exam    External Exam      Right Left   External  Periorbital  edema and ecchymoses -- improved       Slit Lamp Exam      Right Left   Lids/Lashes Mild Meibomian gland dysfunction, Telangiectasia, Ptosis Ptosis, Dermatochalasis - upper lid   Conjunctiva/Sclera post surgical changes Mild Injection temporally, nodular scleritis ST quad   Cornea 1-2+ Punctate epithelial erosions, Temporal Well healed cataract wounds, Debris in tear film 1+ inferior Punctate epithelial erosions, mild Debris in tear film   Anterior Chamber Deep and quiet Deep and quiet   Iris Round and dilated Round and dilated   Lens PC IOL in good position, trace PCO PC IOL in good position, trace Posterior capsular opacification   Vitreous Vitreous syneresis, mild pigment in anterior vitreous, Posterior vitreous detachment; scattered vitreous debris Post vitrectomy, silicon oil -- good fill       Fundus Exam      Right Left   Disc Pink and Sharp Sharp rim, mild tilt, temporal pallor   C/D Ratio 0.2 0.2   Macula Blunted foveal reflex, Retinal pigment epithelial mottling, ERM, No heme or edema Flat under oil, Good foveal reflex under oil, no heme   Vessels Vascular attenuation Vascular attenuation   Periphery Attached over scleral buckle, good buckle height, good scarring over buckle good buckle height; good laser surrounding buckle; improved fibrosis; retina attached under oil, retinotomy from 0300-0430 with good laser surrounding        Refraction    Wearing Rx      Sphere Cylinder Axis Add   Right -1.00 Sphere  +2.50    Left [place] +1.00 025 +2.50          IMAGING AND PROCEDURES  Imaging and Procedures for _0 @  OCT, Retina - OU - Both Eyes       Right Eye Quality was good. Central Foveal Thickness: 293. Progression has been stable. Findings include normal foveal contour, no IRF, no SRF, epiretinal membrane (stable).   Left Eye Quality was good. Central Foveal Thickness: 252. Progression has been stable. Findings include epiretinal membrane, normal foveal contour, no SRF, no IRF (Trace cystic changes).   Notes *Images captured and stored on drive  Diagnosis / Impression:  OD: mild ERM; NFP; no IRF/SRF-stable from prior OS: retina reattached; no IRF/SRF; trace cystic changes   Clinical management:  See below  Abbreviations: NFP - Normal foveal profile. CME - cystoid macular edema. PED - pigment epithelial detachment. IRF - intraretinal fluid. SRF - subretinal fluid. EZ - ellipsoid zone. ERM - epiretinal membrane. ORA - outer retinal atrophy. ORT - outer retinal tubulation. SRHM - subretinal hyper-reflective material                 ASSESSMENT/PLAN:    ICD-10-CM   1. Left retinal detachment  H33.22   2. 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 Retinal edema  H35.81 OCT, Retina - OU - Both Eyes  6. CME (cystoid macular edema), left  H35.352   7. History of retinal detachment  Z86.69   8. Pseudophakia of both eyes  Z96.1     1-3. Retinal  detachment, OS - originally: macula-sparing inferior retinal detachment from 3-6 oclock - large HST at 0500 and 2 small tears at 300 within the detached retina - s/pSBP +  PPV/PFC/EL/FAX/14% C3F8 OS, 10.07.19 - progressive fibrosis/PVR just posterior to buckle around 0400 with +SRF tracking posterior to buckle -- focal tractional/PVR detachment OS - s/p PYP/[id number] silicon oil OS, [id number] - retina attachedand in good position -- good buckle height and laser around retinotomy - OCT shows trace cystic changes OS - IOP okay at 14 today - cont Cosopt BID OS - discussed surgery for silicon oil removal and RBA of procedure -surgery scheduled for May 24, 2019 at 1:30PM, [facility] OR 8--need to postpone surgery due to inflammation of nodular scleritis OS  4. Nodular scleritis OS - sup temporal quadrant  - pain/discomfort symptoms improved, but nodular inflammation still present and not improved on exam - cont Durezol / Prolensa TID OS (started by JD) - continue Ibuprofen 800 mg TID - pt already on prilosec for GI prophylaxis             - need lab work to rule out rheumatologic cause   CBC, CMP   HIV   HLA Panel   ANA   ANCA   ACE, Lysozyme   RF   ESR, CRP - f/u 2 weeks  5,6. CME OS  - post-op CME  - initially treated w/ PF and ketorolac -- had interval increase in CME  - s/p sub-tenons kenalog (03.13.20) -- tr cystic changes on OCT today  - continue durezol and prolensa as above  7. History of retinal detachment OD  - S/P SBP OD 11/26/14 -- Dr. Tempie Hoist  - looks great -- retina in great position  - monitor  8. Pseudophakia OU  - s/p CE/IOL OU ([place])  - doing well  - monitor   Ophthalmic Meds Ordered this visit:  Meds ordered this encounter  Medications  . PROLENSA 0.07 % SOLN    Sig: Place 1 drop into the left eye 2 (two) times a day.    Dispense:  6 mL    Refill:  2  . dorzolamide-timolol (COSOPT) 22.3-6.8 MG/ML ophthalmic solution    Sig: Place 1 drop  into the left eye 2 (two) times daily.    Dispense:  10 mL    Refill:  2  . DUREZOL 0.05 % EMUL    Sig: Place 1 drop into the left eye 3 (three) times daily.    Dispense:  10 mL    Refill:  2       Return 2 weeks, for DFE, OCT.  There are no Patient Instructions on file for this visit.   Explained the diagnoses, plan, and follow up with the patient and they expressed understanding.  Patient expressed understanding of the importance of proper follow up care.  This document serves as a record of services personally performed by Gardiner Sleeper, MD, PhD. It was created on their behalf by Ernest Mallick, OA, an ophthalmic assistant. The creation of this record is the provider's dictation and/or activities during the visit.    Electronically signed by: Ernest Mallick, OA  07.02.2020 1:03 PM    Gardiner Sleeper, M.D., Ph.D. Diseases & Surgery of the Retina and Vitreous [facility]  I have reviewed the above documentation for accuracy and completeness, and I agree with the above. Gardiner Sleeper, M.D., Ph.D. 05/18/19 1:14 PM     Abbreviations: M myopia (nearsighted); A astigmatism;  H hyperopia (farsighted); P presbyopia; Mrx spectacle prescription;  CTL contact lenses; OD right eye; OS left eye; OU both eyes  XT exotropia; ET esotropia; PEK punctate epithelial keratitis; PEE punctate epithelial erosions; DES dry eye syndrome; MGD meibomian gland dysfunction; ATs artificial tears; PFAT's preservative free artificial tears; [place] nuclear sclerotic cataract; PSC posterior subcapsular cataract; ERM epi-retinal membrane; PVD posterior vitreous detachment; RD retinal detachment; DM diabetes mellitus; DR diabetic retinopathy; NPDR non-proliferative diabetic retinopathy; PDR proliferative diabetic retinopathy; CSME clinically significant macular edema; DME diabetic macular edema; dbh dot blot hemorrhages; CWS cotton wool spot; POAG primary open angle glaucoma; C/D cup-to-disc ratio;  HVF humphrey visual field; GVF goldmann visual field; OCT optical coherence tomography; IOP intraocular pressure; BRVO Branch retinal vein occlusion; CRVO central retinal vein occlusion; CRAO central retinal artery occlusion; BRAO branch retinal artery occlusion; RT retinal tear; SB scleral buckle; PPV pars plana vitrectomy; VH Vitreous hemorrhage; PRP panretinal laser photocoagulation; IVK intravitreal kenalog; VMT vitreomacular traction; MH Macular hole;  NVD neovascularization of the disc; NVE neovascularization elsewhere; AREDS age related eye disease study; ARMD age related macular degeneration; POAG primary open angle glaucoma; EBMD epithelial/anterior basement membrane dystrophy; ACIOL anterior chamber intraocular lens; IOL intraocular lens; PCIOL posterior chamber intraocular lens; Phaco/IOL phacoemulsification with intraocular lens placement; [place] photorefractive keratectomy; LASIK laser assisted in situ keratomileusis; HTN hypertension; DM diabetes mellitus; COPD chronic obstructive pulmonary disease

## 2019-05-18 ENCOUNTER — Other Ambulatory Visit: Payer: Self-pay

## 2019-05-18 ENCOUNTER — Encounter (INDEPENDENT_AMBULATORY_CARE_PROVIDER_SITE_OTHER): Payer: Self-pay | Admitting: Ophthalmology

## 2019-05-18 ENCOUNTER — Ambulatory Visit (INDEPENDENT_AMBULATORY_CARE_PROVIDER_SITE_OTHER): Payer: Medicare Other | Admitting: Ophthalmology

## 2019-05-18 DIAGNOSIS — H3342 Traction detachment of retina, left eye: Secondary | ICD-10-CM

## 2019-05-18 DIAGNOSIS — H35352 Cystoid macular degeneration, left eye: Secondary | ICD-10-CM

## 2019-05-18 DIAGNOSIS — H3322 Serous retinal detachment, left eye: Secondary | ICD-10-CM

## 2019-05-18 DIAGNOSIS — H3581 Retinal edema: Secondary | ICD-10-CM

## 2019-05-18 DIAGNOSIS — Z8669 Personal history of other diseases of the nervous system and sense organs: Secondary | ICD-10-CM

## 2019-05-18 DIAGNOSIS — H15092 Other scleritis, left eye: Secondary | ICD-10-CM | POA: Diagnosis not present

## 2019-05-18 DIAGNOSIS — Z961 Presence of intraocular lens: Secondary | ICD-10-CM

## 2019-05-18 DIAGNOSIS — H40052 Ocular hypertension, left eye: Secondary | ICD-10-CM | POA: Diagnosis not present

## 2019-05-18 MED ORDER — DUREZOL 0.05 % OP EMUL: 1.0000 [drp] | Freq: Three times a day (TID) | OPHTHALMIC | 2 refills | Status: DC

## 2019-05-18 MED ORDER — DORZOLAMIDE HCL-TIMOLOL MAL 2-0.5 % OP SOLN: 1.0000 [drp] | Freq: Two times a day (BID) | OPHTHALMIC | 2 refills | Status: DC

## 2019-05-18 MED ORDER — PROLENSA 0.07 % OP SOLN: 1.0000 [drp] | Freq: Two times a day (BID) | OPHTHALMIC | 2 refills | Status: DC

## 2019-05-21 ENCOUNTER — Inpatient Hospital Stay (HOSPITAL_COMMUNITY): Admission: RE | Admit: 2019-05-21 | Payer: Medicare Other | Source: Ambulatory Visit

## 2019-05-24 ENCOUNTER — Ambulatory Visit (HOSPITAL_COMMUNITY): Admission: RE | Admit: 2019-05-24 | Payer: Medicare Other | Source: Home / Self Care | Admitting: Ophthalmology

## 2019-05-24 ENCOUNTER — Encounter (HOSPITAL_COMMUNITY): Admission: RE | Payer: Self-pay | Source: Home / Self Care

## 2019-05-24 SURGERY — PARS PLANA VITRECTOMY WITH 25 GAUGE
Anesthesia: General | Laterality: Left

## 2019-05-25 ENCOUNTER — Encounter (INDEPENDENT_AMBULATORY_CARE_PROVIDER_SITE_OTHER): Payer: Medicare Other | Admitting: Ophthalmology

## 2019-05-28 LAB — COMPREHENSIVE METABOLIC PANEL
ALT: 19 IU/L (ref 0–32)
AST: 20 IU/L (ref 0–40)
Albumin/Globulin Ratio: 2 (ref 1.2–2.2)
Albumin: 4.6 g/dL (ref 3.7–4.7)
Alkaline Phosphatase: 90 IU/L (ref 39–117)
BUN/Creatinine Ratio: 13 (ref 12–28)
BUN: 11 mg/dL (ref 8–27)
Bilirubin Total: 0.5 mg/dL (ref 0.0–1.2)
CO2: 27 mmol/L (ref 20–29)
Calcium: 9.9 mg/dL (ref 8.7–10.3)
Chloride: 102 mmol/L (ref 96–106)
Creatinine, Ser: 0.83 mg/dL (ref 0.57–1.00)
GFR calc Af Amer: 79 mL/min/{1.73_m2} (ref >59)
GFR calc non Af Amer: 69 mL/min/{1.73_m2} (ref >59)
Globulin, Total: 2.3 g/dL (ref 1.5–4.5)
Glucose: 105 mg/dL — ABNORMAL HIGH (ref 65–99)
Potassium: 4.8 mmol/L (ref 3.5–5.2)
Sodium: 140 mmol/L (ref 134–144)
Total Protein: 6.9 g/dL (ref 6.0–8.5)

## 2019-05-28 LAB — CBC
Hematocrit: 41.5 % (ref 34.0–46.6)
Hemoglobin: 13.7 g/dL (ref 11.1–15.9)
MCH: 29.4 pg (ref 26.6–33.0)
MCHC: 33 g/dL (ref 31.5–35.7)
MCV: 89 fL (ref 79–97)
Platelets: 271 10*3/uL (ref 150–450)
RBC: 4.66 x10E6/uL (ref 3.77–5.28)
RDW: 12.4 % (ref 11.7–15.4)
WBC: 8.6 10*3/uL (ref 3.4–10.8)

## 2019-05-28 LAB — HLA A,B,C (IR)

## 2019-05-28 LAB — ANA: Anti Nuclear Antibody (ANA): NEGATIVE

## 2019-05-28 LAB — ANTINEUTROPHIL CYTOPLASMIC AB
Atypical pANCA: <1:20 {titer}
Cytoplasmic (C-ANCA): <1:20 {titer}
Perinuclear (P-ANCA): <1:20 {titer}

## 2019-05-28 LAB — ANGIOTENSIN CONVERTING ENZYME: Angio Convert Enzyme: 65 U/L (ref 14–82)

## 2019-05-28 LAB — SEDIMENTATION RATE: Sed Rate: 20 mm/hr (ref 0–40)

## 2019-05-28 LAB — RHEUMATOID FACTOR: Rheumatoid fact SerPl-aCnc: <10 IU/mL (ref 0.0–13.9)

## 2019-05-28 LAB — RPR: RPR Ser Ql: NONREACTIVE

## 2019-05-28 LAB — C-REACTIVE PROTEIN: CRP: 3 mg/L (ref 0–10)

## 2019-05-30 NOTE — Progress Notes (Signed)
[facility] Note  05/31/2019     CHIEF COMPLAINT Patient presents for Retina Follow Up   HISTORY OF PRESENT ILLNESS: Carla May is a 78 y.o. female who presents to the clinic today for:   HPI    Retina Follow Up    Patient presents with  Retinal Break/Detachment.  In left eye.  This started 2 months ago.  Severity is mild.  Since onset it is stable.  I, the attending physician,  performed the HPI with the patient and updated documentation appropriately.          Comments    F/U RT OS. Patient states her vision is getting "better", denies new visual onsets/onsets.       Last edited by Bernarda Caffey, MD on 05/31/2019  9:40 AM. (History)    Patient states vision getting better  Referring physician: Kelton Pillar, MD [place],  [place] [address]  HISTORICAL INFORMATION:   Selected notes from the MEDICAL RECORD NUMBER Referred by Dr. Len Blalock for concern of retinal hole LEE: Mellody Memos) [BCVA: OD: OS:] Ocular Hx- PMH-anxiety, arthritis, HLD    CURRENT MEDICATIONS: Current Outpatient Medications (Ophthalmic Drugs)  Medication Sig  . carboxymethylcellulose (REFRESH PLUS) 0.5 % SOLN Place 1 drop into both eyes 3 (three) times daily as needed (for dry eyes).   . dorzolamide-timolol (COSOPT) 22.3-6.8 MG/ML ophthalmic solution Place 1 drop into the left eye 2 (two) times daily.  . DUREZOL 0.05 % EMUL Place 1 drop into the left eye 3 (three) times daily.  Marland Kitchen PROLENSA 0.07 % SOLN Place 1 drop into the left eye 2 (two) times a day.   No current facility-administered medications for this visit.  (Ophthalmic Drugs)   Current Outpatient Medications (Other)  Medication Sig  . aspirin EC 81 MG tablet Take 81 mg by mouth daily.  Marland Kitchen ibuprofen (ADVIL) 800 MG tablet Take 800 mg by mouth every 8 (eight) hours as needed for headache or moderate pain.   . Multiple Vitamin (MULTIVITAMIN WITH MINERALS) TABS tablet Take 1 tablet  by mouth daily. One-A-Day Multivitamin  . Omega-3 Fatty Acids (FISH OIL PO) Take 1,400 mg by mouth daily.   Marland Kitchen omeprazole (PRILOSEC OTC) 20 MG tablet Take 20 mg by mouth daily.  . predniSONE (DELTASONE) 20 MG tablet Take 3 tablets daily   Current Facility-Administered Medications (Other)  Medication Route  . triamcinolone acetonide (KENALOG-40) injection 4 mg Intravitreal      REVIEW OF SYSTEMS: ROS    Positive for: Eyes   Negative for: Constitutional, Gastrointestinal, Neurological, Skin, Genitourinary, Musculoskeletal, HENT, Endocrine, Cardiovascular, Respiratory, Psychiatric, Allergic/Imm, Heme/Lymph   Last edited by Zenovia Jordan, LPN on 3/88/8280  0:34 AM. (History)       ALLERGIES Allergies  Allergen Reactions  . Atorvastatin Other (See Comments)    Muscle aches/ memory loss  . Penicillins Itching, Nausea Only and Other (See Comments)    Did it involve swelling of the face/tongue/throat, SOB, or low BP? Unknown Did it involve sudden or severe rash/hives, skin peeling, or any reaction on the inside of your mouth or nose? Itching Did you need to seek medical attention at a hospital or doctor's office? No When did it last happen? 1970s If all above answers are "NO", may proceed with cephalosporin use.   . Sulfa Antibiotics Nausea Only    PAST MEDICAL HISTORY Past Medical History:  Diagnosis Date  . Anxiety   . Arthritis   .  GERD (gastroesophageal reflux disease)   . History of palpitations   . Hyperlipidemia   . MVP (mitral valve prolapse)   . PONV (postoperative nausea and vomiting)    No problems in 08/2018; however has had previous problems  . Pre-diabetes   . Retinal detachment    OS   Past Surgical History:  Procedure Laterality Date  . ABDOMINAL HYSTERECTOMY  1970's  . CATARACT EXTRACTION    . EYE SURGERY Bilateral   . GAS INSERTION Right 11/26/2014   Procedure: INSERTION OF GAS;  Surgeon: Hayden Pedro, MD;  Location: [facility];  Service:  Ophthalmology;  Laterality: Right;  C3F8  . GAS/FLUID EXCHANGE Left 08/21/2018   Procedure: GAS/FLUID EXCHANGE;  Surgeon: Bernarda Caffey, MD;  Location: [facility];  Service: Ophthalmology;  Laterality: Left;  C3F8  . KNEE ARTHROSCOPY Left   . PHOTOCOAGULATION WITH LASER Right 11/26/2014   Procedure: PHOTOCOAGULATION WITH LASER;  Surgeon: Hayden Pedro, MD;  Location: [facility];  Service: Ophthalmology;  Laterality: Right;  . PHOTOCOAGULATION WITH LASER Left 08/21/2018   Procedure: PHOTOCOAGULATION WITH LASER;  Surgeon: Bernarda Caffey, MD;  Location: [facility];  Service: Ophthalmology;  Laterality: Left;  . REPAIR OF COMPLEX TRACTION RETINAL DETACHMENT Left 12/06/2018   Procedure: REPAIR OF COMPLEX TRACTION RETINAL DETACHMENT, 25 gauge vitrectomy, endolaser photocoagulation, memebrane peel, perfluoron injection,  and silicone oil;  Surgeon: Bernarda Caffey, MD;  Location: [facility];  Service: Ophthalmology;  Laterality: Left;  . RETINAL DETACHMENT SURGERY Left 08/21/2018   SBP - Dr. Adonis Housekeeper  . SCLERAL BUCKLE Right 11/26/2014   Procedure: SCLERAL BUCKLE RIGHT EYE ;  Surgeon: Hayden Pedro, MD;  Location: [place] [facility];  Service: Ophthalmology;  Laterality: Right;  . SCLERAL BUCKLE WITH POSSIBLE 25 GAUGE PARS PLANA VITRECTOMY Left 08/21/2018   Procedure: SCLERAL BUCKLE WITH 25 GAUGE PARS PLANA VITRECTOMY;  Surgeon: Bernarda Caffey, MD;  Location: [facility];  Service: Ophthalmology;  Laterality: Left;    FAMILY HISTORY Family History  Problem Relation Age of Onset  . Cancer Mother   . Heart failure Father     SOCIAL HISTORY Social History   Tobacco Use  . Smoking status: Former Smoker    Years: 10.00  . Smokeless tobacco: Never Used  . Tobacco comment: quit in 1970's  Substance Use Topics  . Alcohol use: No  . Drug use: No         OPHTHALMIC EXAM:  Base Eye Exam    Visual Acuity (Snellen - Linear)      Right Left   Dist cc 20/40 -2 20/100 -2   Dist ph cc 20/40 +1 20/70 +1   Correction: Glasses        Tonometry (Tonopen, 9:25 AM)      Right Left   Pressure 11 12       Pupils      Dark Light Shape React APD   Right 3 2 Round Brisk None   Left 3 2 Round Brisk None       Visual Fields (Counting fingers)      Left Right    Full Full       Extraocular Movement      Right Left    Full, Ortho Full, Ortho       Neuro/Psych    Oriented x3: Yes   Mood/Affect: Normal       Dilation    Both eyes: 1.0% Mydriacyl, 2.5% Phenylephrine @ 9:21 AM        Slit Lamp  and Fundus Exam    Slit Lamp Exam      Right Left   Lids/Lashes Mild Meibomian gland dysfunction, Telangiectasia, Ptosis Ptosis, Dermatochalasis - upper lid   Conjunctiva/Sclera post surgical changes Mild Injection temporally, nodular scleritis ST quad -- 5.5 mm vertically X 8 mm horizontal with some injection-- persistent, not improved    Cornea 1-2+ Punctate epithelial erosions, Temporal Well healed cataract wounds, Debris in tear film 1+ inferior Punctate epithelial erosions, mild Debris in tear film   Anterior Chamber Deep and quiet Deep and quiet   Iris Round and dilated Round and dilated   Lens PC IOL in good position, trace PCO PC IOL in good position, trace Posterior capsular opacification   Vitreous Vitreous syneresis, mild pigment in anterior vitreous, Posterior vitreous detachment; scattered vitreous debris Post vitrectomy, silicon oil -- good fill       Fundus Exam      Right Left   Disc Pink and Sharp Sharp rim, mild tilt, temporal pallor   C/D Ratio 0.2 0.2   Macula Blunted foveal reflex, Retinal pigment epithelial mottling, ERM, No heme or edema Flat under oil, Good foveal reflex under oil, no heme   Vessels Vascular attenuation Vascular attenuation   Periphery Attached over scleral buckle, good buckle height, good scarring over buckle good buckle height; good laser surrounding buckle; improved fibrosis; retina attached under oil, retinotomy from 0300-0430 with good laser surrounding          IMAGING  AND PROCEDURES  Imaging and Procedures for '@TODAY' @  OCT, Retina - OU - Both Eyes       Right Eye Quality was good. Central Foveal Thickness: 288. Progression has been stable. Findings include normal foveal contour, no IRF, no SRF, epiretinal membrane (stable).   Left Eye Quality was good. Central Foveal Thickness: 252. Progression has been stable. Findings include epiretinal membrane, normal foveal contour, no SRF, no IRF (Trace cystic changes).   Notes *Images captured and stored on drive  Diagnosis / Impression:  OD: mild ERM; NFP; no IRF/SRF-stable from prior OS: retina reattached; no IRF/SRF; trace cystic changes--improved from prior  Clinical management:  See below  Abbreviations: NFP - Normal foveal profile. CME - cystoid macular edema. PED - pigment epithelial detachment. IRF - intraretinal fluid. SRF - subretinal fluid. EZ - ellipsoid zone. ERM - epiretinal membrane. ORA - outer retinal atrophy. ORT - outer retinal tubulation. SRHM - subretinal hyper-reflective material                 ASSESSMENT/PLAN:    ICD-10-CM   1. Left retinal detachment  H33.22   2. 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 Retinal edema  H35.81 OCT, Retina - OU - Both Eyes  6. CME (cystoid macular edema), left  H35.352   7. History of retinal detachment  Z86.69   8. Pseudophakia of both eyes  Z96.1     1-3. Retinal detachment, OS - originally: macula-sparing inferior retinal detachment from 3-6 oclock - large HST at 0500 and 2 small tears at 300 within the detached retina - s/pSBP + PPV/PFC/EL/FAX/14% C3F8 OS, 10.07.19 - progressive fibrosis/PVR just posterior to buckle around 0400 with +SRF tracking posterior to buckle -- focal tractional/PVR detachment OS - s/p AOZ/HY/QMV/HQIONGEXBM/WU/1324MW silicon oil OS,  1.02.7253 - retina attachedand in good position -- good buckle height and laser around retinotomy - OCT  shows trace cystic changes OS -- improved - IOP okay at 14 today - cont Cosopt BID OS - discussed surgery for silicon oil removal and RBA of procedure -surgery was scheduled for May 24, 2019 at 1:30PM, [facility] OR 8--surgery postponed due to inflammation of nodular scleritis OS  4. Nodular scleritis OS - sup temporal quadrant  - pain/discomfort symptoms improved, but nodular inflammation still present and not improved on exam - cont Durezol / Prolensa TID OS (started by JDM) - continue Ibuprofen 800 mg TID  -already on 10 mg prednisone for poison oak, starting yesterday, recommend prednisone 60 mg daily for 1 week - pt already on prilosec for GI prophylaxis             - lab work to rule out rheumatologic cause--all results negative   CBC, CMP   HIV   HLA Panel   ANA   ANCA   ACE, Lysozyme   RF   ESR, CRP - f/u 1 week  5,6. CME OS  - post-op CME  - initially treated w/ PF and ketorolac -- had interval increase in CME  - s/p sub-tenons kenalog (03.13.20) -- tr cystic changes on OCT today  - continue durezol and prolensa as above  7. History of retinal detachment OD  - S/P SBP OD 11/26/14 -- Dr. Tempie Hoist  - looks great -- retina in great position  - monitor  8. Pseudophakia OU  - s/p CE/IOL OU ([place])  - doing well  - monitor   Ophthalmic Meds Ordered this visit:  Meds ordered this encounter  Medications  . predniSONE (DELTASONE) 20 MG tablet    Sig: Take 3 tablets daily    Dispense:  90 tablet    Refill:  0       Return 1 week, for DFE, OCT.  There are no Patient Instructions on file for this visit.   Explained the diagnoses, plan, and follow up with the patient and they expressed understanding.  Patient expressed  understanding of the importance of proper follow up care.   This document serves as a record of services personally performed by Gardiner Sleeper, MD, PhD. It was created on their behalf by Ernest Mallick, OA, an ophthalmic assistant. The creation of this record is the provider's dictation and/or activities during the visit.    Electronically signed by: Ernest Mallick, OA 07.15.2020 11:01 PM     Gardiner Sleeper, M.D., Ph.D. Diseases & Surgery of the Retina and Vitreous [facility]  I have reviewed the above documentation for accuracy and completeness, and I agree with the above. Gardiner Sleeper, M.D., Ph.D. 05/31/19 11:01 PM     Abbreviations: M myopia (nearsighted); A astigmatism; H hyperopia (farsighted); P presbyopia; Mrx spectacle prescription;  CTL contact lenses; OD right eye; OS left eye; OU both eyes  XT exotropia; ET esotropia; PEK punctate epithelial keratitis; PEE punctate epithelial erosions; DES dry eye syndrome; MGD meibomian gland dysfunction; ATs artificial tears; PFAT's preservative free artificial tears; [place] nuclear sclerotic cataract; PSC posterior subcapsular cataract; ERM epi-retinal membrane; PVD posterior vitreous detachment; RD retinal detachment; DM diabetes mellitus; DR diabetic retinopathy; NPDR non-proliferative diabetic retinopathy; PDR proliferative diabetic retinopathy; CSME clinically significant macular edema; DME diabetic macular edema; dbh dot blot hemorrhages; CWS cotton wool spot; POAG primary open angle glaucoma; C/D cup-to-disc ratio; HVF humphrey visual field; GVF goldmann visual field; OCT optical coherence tomography; IOP intraocular pressure; BRVO Branch retinal vein occlusion; CRVO central retinal vein occlusion;  CRAO central retinal artery occlusion; BRAO branch retinal artery occlusion; RT retinal tear; SB scleral buckle; PPV pars plana vitrectomy; VH Vitreous hemorrhage; PRP panretinal laser photocoagulation; IVK intravitreal kenalog;  VMT vitreomacular traction; MH Macular hole;  NVD neovascularization of the disc; NVE neovascularization elsewhere; AREDS age related eye disease study; ARMD age related macular degeneration; POAG primary open angle glaucoma; EBMD epithelial/anterior basement membrane dystrophy; ACIOL anterior chamber intraocular lens; IOL intraocular lens; PCIOL posterior chamber intraocular lens; Phaco/IOL phacoemulsification with intraocular lens placement; [place] photorefractive keratectomy; LASIK laser assisted in situ keratomileusis; HTN hypertension; DM diabetes mellitus; COPD chronic obstructive pulmonary disease

## 2019-05-31 ENCOUNTER — Other Ambulatory Visit: Payer: Self-pay

## 2019-05-31 ENCOUNTER — Ambulatory Visit (INDEPENDENT_AMBULATORY_CARE_PROVIDER_SITE_OTHER): Payer: Medicare Other | Admitting: Ophthalmology

## 2019-05-31 ENCOUNTER — Encounter (INDEPENDENT_AMBULATORY_CARE_PROVIDER_SITE_OTHER): Payer: Self-pay | Admitting: Ophthalmology

## 2019-05-31 DIAGNOSIS — H40052 Ocular hypertension, left eye: Secondary | ICD-10-CM

## 2019-05-31 DIAGNOSIS — H15092 Other scleritis, left eye: Secondary | ICD-10-CM | POA: Diagnosis not present

## 2019-05-31 DIAGNOSIS — H3581 Retinal edema: Secondary | ICD-10-CM | POA: Diagnosis not present

## 2019-05-31 DIAGNOSIS — H3342 Traction detachment of retina, left eye: Secondary | ICD-10-CM | POA: Diagnosis not present

## 2019-05-31 DIAGNOSIS — H3322 Serous retinal detachment, left eye: Secondary | ICD-10-CM | POA: Diagnosis not present

## 2019-05-31 DIAGNOSIS — Z8669 Personal history of other diseases of the nervous system and sense organs: Secondary | ICD-10-CM

## 2019-05-31 DIAGNOSIS — Z961 Presence of intraocular lens: Secondary | ICD-10-CM

## 2019-05-31 DIAGNOSIS — H35352 Cystoid macular degeneration, left eye: Secondary | ICD-10-CM

## 2019-05-31 MED ORDER — PREDNISONE 20 MG PO TABS: ORAL_TABLET | ORAL | 0 refills | Status: DC

## 2019-06-06 NOTE — Progress Notes (Signed)
[facility] Note  06/07/2019     CHIEF COMPLAINT Patient presents for Retina Follow Up   HISTORY OF PRESENT ILLNESS: PAMI Carla May is a 78 y.o. female who presents to the clinic today for:   HPI    Retina Follow Up    Patient presents with  Other.  In left eye.  This started 1 week ago.  Severity is moderate.  I, the attending physician,  performed the HPI with the patient and updated documentation appropriately.          Comments    Patient here for 1 week retina follow up for nodular scleritits OS. Patient states vision seems to be doing good. No eye pain.        Last edited by Bernarda Caffey, MD on 06/07/2019 10:55 AM. (History)    Patient states vision seems to be good OS. No eye pain. Using durezol tid OS and prolensa bid OS. Also using cosopt bid OS for IOP control.  Referring physician: Kelton Pillar, MD [place],  [place] [phone]  HISTORICAL INFORMATION:   Selected notes from the MEDICAL RECORD NUMBER Referred by Dr. Len Blalock for concern of retinal hole LEE: Mellody Memos) [BCVA: OD: OS:] Ocular Hx- PMH-anxiety, arthritis, HLD    CURRENT MEDICATIONS: Current Outpatient Medications (Ophthalmic Drugs)  Medication Sig  . carboxymethylcellulose (REFRESH PLUS) 0.5 % SOLN Place 1 drop into both eyes 3 (three) times daily as needed (for dry eyes).   . dorzolamide-timolol (COSOPT) 22.3-6.8 MG/ML ophthalmic solution Place 1 drop into the left eye 2 (two) times daily.  . DUREZOL 0.05 % EMUL Place 1 drop into the left eye 3 (three) times daily.  Marland Kitchen PROLENSA 0.07 % SOLN Place 1 drop into the left eye 2 (two) times a day.   No current facility-administered medications for this visit.  (Ophthalmic Drugs)   Current Outpatient Medications (Other)  Medication Sig  . aspirin EC 81 MG tablet Take 81 mg by mouth daily.  Marland Kitchen ibuprofen (ADVIL) 800 MG tablet Take 800 mg by mouth every 8 (eight) hours as needed for  headache or moderate pain.   . Multiple Vitamin (MULTIVITAMIN WITH MINERALS) TABS tablet Take 1 tablet by mouth daily. One-A-Day Multivitamin  . Omega-3 Fatty Acids (FISH OIL PO) Take 1,400 mg by mouth daily.   Marland Kitchen omeprazole (PRILOSEC OTC) 20 MG tablet Take 20 mg by mouth daily.  . predniSONE (DELTASONE) 20 MG tablet Take 3 tablets daily   Current Facility-Administered Medications (Other)  Medication Route  . triamcinolone acetonide (KENALOG-40) injection 4 mg Intravitreal      REVIEW OF SYSTEMS: ROS    Positive for: Gastrointestinal, Musculoskeletal, Eyes   Negative for: Constitutional, Neurological, Skin, Genitourinary, HENT, Endocrine, Cardiovascular, Respiratory, Psychiatric, Allergic/Imm, Heme/Lymph   Last edited by Theodore Demark on 06/07/2019 10:15 AM. (History)       ALLERGIES Allergies  Allergen Reactions  . Atorvastatin Other (See Comments)    Muscle aches/ memory loss  . Penicillins Itching, Nausea Only and Other (See Comments)    Did it involve swelling of the face/tongue/throat, SOB, or low BP? Unknown Did it involve sudden or severe rash/hives, skin peeling, or any reaction on the inside of your mouth or nose? Itching Did you need to seek medical attention at a hospital or doctor's office? No When did it last happen? 1970s If all above answers are "NO", may proceed with cephalosporin use.   . Sulfa Antibiotics  Nausea Only    PAST MEDICAL HISTORY Past Medical History:  Diagnosis Date  . Anxiety   . Arthritis   . GERD (gastroesophageal reflux disease)   . History of palpitations   . Hyperlipidemia   . MVP (mitral valve prolapse)   . PONV (postoperative nausea and vomiting)    No problems in 08/2018; however has had previous problems  . Pre-diabetes   . Retinal detachment    OS   Past Surgical History:  Procedure Laterality Date  . ABDOMINAL HYSTERECTOMY  1970's  . CATARACT EXTRACTION    . EYE SURGERY Bilateral   . GAS INSERTION Right 11/26/2014    Procedure: INSERTION OF GAS;  Surgeon: Hayden Pedro, MD;  Location: [facility];  Service: Ophthalmology;  Laterality: Right;  C3F8  . GAS/FLUID EXCHANGE Left 08/21/2018   Procedure: GAS/FLUID EXCHANGE;  Surgeon: Bernarda Caffey, MD;  Location: [facility];  Service: Ophthalmology;  Laterality: Left;  C3F8  . KNEE ARTHROSCOPY Left   . PHOTOCOAGULATION WITH LASER Right 11/26/2014   Procedure: PHOTOCOAGULATION WITH LASER;  Surgeon: Hayden Pedro, MD;  Location: [facility];  Service: Ophthalmology;  Laterality: Right;  . PHOTOCOAGULATION WITH LASER Left 08/21/2018   Procedure: PHOTOCOAGULATION WITH LASER;  Surgeon: Bernarda Caffey, MD;  Location: [facility];  Service: Ophthalmology;  Laterality: Left;  . REPAIR OF COMPLEX TRACTION RETINAL DETACHMENT Left 12/06/2018   Procedure: REPAIR OF COMPLEX TRACTION RETINAL DETACHMENT, 25 gauge vitrectomy, endolaser photocoagulation, memebrane peel, perfluoron injection,  and silicone oil;  Surgeon: Bernarda Caffey, MD;  Location: [facility];  Service: Ophthalmology;  Laterality: Left;  . RETINAL DETACHMENT SURGERY Left 08/21/2018   SBP - Dr. Adonis Housekeeper  . SCLERAL BUCKLE Right 11/26/2014   Procedure: SCLERAL BUCKLE RIGHT EYE ;  Surgeon: Hayden Pedro, MD;  Location: [facility];  Service: Ophthalmology;  Laterality: Right;  . SCLERAL BUCKLE WITH POSSIBLE 25 GAUGE PARS PLANA VITRECTOMY Left 08/21/2018   Procedure: SCLERAL BUCKLE WITH 25 GAUGE PARS PLANA VITRECTOMY;  Surgeon: Bernarda Caffey, MD;  Location: [facility];  Service: Ophthalmology;  Laterality: Left;    FAMILY HISTORY Family History  Problem Relation Age of Onset  . Cancer Mother   . Heart failure Father     SOCIAL HISTORY Social History   Tobacco Use  . Smoking status: Former Smoker    Years: 10.00  . Smokeless tobacco: Never Used  . Tobacco comment: quit in 1970's  Substance Use Topics  . Alcohol use: No  . Drug use: No         OPHTHALMIC EXAM:  Base Eye Exam    Visual Acuity (Snellen - Linear)      Right Left    Dist cc 20/40 20/150   Dist ph cc 20/30 -2 20/60   Correction: Glasses       Tonometry (Tonopen, 10:12 AM)      Right Left   Pressure 13 10       Pupils      Dark Light Shape React APD   Right 3 2 Round Brisk None   Left 3 2 Round Brisk None       Visual Fields (Counting fingers)      Left Right    Full        Extraocular Movement      Right Left    Full, Ortho Full, Ortho       Neuro/Psych    Oriented x3: Yes   Mood/Affect: Normal       Dilation  Both eyes: 1.0% Mydriacyl, 2.5% Phenylephrine @ 10:12 AM        Slit Lamp and Fundus Exam    Slit Lamp Exam      Right Left   Lids/Lashes Mild Meibomian gland dysfunction, Telangiectasia, Ptosis Ptosis, Dermatochalasis - upper lid   Conjunctiva/Sclera post surgical changes Mild Injection temporally, nodular scleritis ST quad -- 4 mm vertically X 5 mm horizontal with improved injection (1+ injection today)   Cornea 1-2+ Punctate epithelial erosions, Temporal Well healed cataract wounds, Debris in tear film 3+ inferior Punctate epithelial erosions, mild Debris in tear film   Anterior Chamber Deep and quiet Deep and quiet   Iris Round and dilated Round and dilated   Lens PC IOL in good position, trace PCO PC IOL in good position, trace Posterior capsular opacification   Vitreous Vitreous syneresis, mild pigment in anterior vitreous, Posterior vitreous detachment; scattered vitreous debris Post vitrectomy, silicon oil -- good fill       Fundus Exam      Right Left   Disc Pink and Sharp Sharp rim, mild tilt, temporal pallor   C/D Ratio 0.2 0.2   Macula Blunted foveal reflex, Retinal pigment epithelial mottling, ERM, No heme or edema Flat under oil, Good foveal reflex under oil, no heme   Vessels Vascular attenuation Vascular attenuation   Periphery Attached over scleral buckle, good buckle height, good scarring over buckle good buckle height; good laser surrounding buckle; improved fibrosis; retina attached under oil,  retinotomy from 0300-0430 with good laser surrounding        Refraction    Wearing Rx      Sphere Cylinder Axis Add   Right -1.00 Sphere  +2.50   Left [place] +1.00 025 +2.50          IMAGING AND PROCEDURES  Imaging and Procedures for _0 @  OCT, Retina - OU - Both Eyes       Right Eye Quality was good. Central Foveal Thickness: 284. Progression has been stable. Findings include normal foveal contour, no IRF, no SRF, epiretinal membrane (stable).   Left Eye Quality was good. Central Foveal Thickness: 240. Progression has improved. Findings include epiretinal membrane, normal foveal contour, no SRF, no IRF (Trace cystic changes resolved).   Notes *Images captured and stored on drive  Diagnosis / Impression:  OD: mild ERM; NFP; no IRF/SRF-stable from prior OS: retina reattached; no IRF/SRF; trace cystic changes resolved  Clinical management:  See below  Abbreviations: NFP - Normal foveal profile. CME - cystoid macular edema. PED - pigment epithelial detachment. IRF - intraretinal fluid. SRF - subretinal fluid. EZ - ellipsoid zone. ERM - epiretinal membrane. ORA - outer retinal atrophy. ORT - outer retinal tubulation. SRHM - subretinal hyper-reflective material                 ASSESSMENT/PLAN:    ICD-10-CM   1. Left retinal detachment  H33.22   2. 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 Retinal edema  H35.81 OCT, Retina - OU - Both Eyes  6. CME (cystoid macular edema), left  H35.352   7. History of retinal detachment  Z86.69   8. Pseudophakia of both eyes  Z96.1     1-3. Retinal detachment, OS - originally: macula-sparing inferior retinal detachment from 3-6 oclock - large HST at 0500 and 2 small tears at 300 within the detached  retina - s/pSBP + PPV/PFC/EL/FAX/14% C3F8 OS, 10.07.19 - progressive fibrosis/PVR  just posterior to buckle around 0400 with +SRF tracking posterior to buckle -- focal tractional/PVR detachment OS - s/p RWE/RX/VQM/[id number] silicon oil OS, 8.09.9833 - retina attachedand in good position -- good buckle height and laser around retinotomy - OCT shows trace cystic changes OS -- resolved - IOP okay at 10 today - cont Cosopt BID OS - discussed surgery for silicon oil removal and RBA of procedure -surgery was scheduled for May 24, 2019 at 1:30PM, [facility] OR 8--surgery postponed due to inflammation of nodular scleritis OS  - will reschedule once nodular scleritis improves  4. Nodular scleritis OS - sup temporal quadrant  - pain/discomfort symptoms improved last visit, and now nodular inflammation improved with 60 mg po prednisone  - pt reports minimal side effects on po steroid and is tolerating well             - lab work to rule out rheumatologic cause--all results negative   CBC, CMP   HIV   HLA Panel   ANA   ANCA   ACE, Lysozyme   RF   ESR, CRP - cont Durezol TID OS / Prolensa BID OS (started by JDM) - continue Ibuprofen 800 mg TID  - continue prednisone 60 mg daily for 1 week, then 50 mg for 1 week to see if nodule continues to get smaller - pt already on prilosec for GI prophylaxis - f/u 2 weeks  5,6. CME OS  - post-op CME  - initially treated w/ PF and ketorolac -- had interval increase in CME  - s/p sub-tenons kenalog (03.13.20) -- tr cystic changes on OCT resolved today  - continue durezol and prolensa as above  7. History of retinal detachment OD  - S/P SBP OD 11/26/14 -- Dr. Tempie Hoist  - looks great -- retina in great position  - monitor  8. Pseudophakia OU  - s/p CE/IOL OU ([place])  - doing well  - monitor   Ophthalmic Meds Ordered this visit:  No orders of the defined types were  placed in this encounter.      Return in about 2 weeks (around 06/21/2019) for Dilated Exam, OCT, f/u nodular scleritis OS.  There are no Patient Instructions on file for this visit.   Explained the diagnoses, plan, and follow up with the patient and they expressed understanding.  Patient expressed understanding of the importance of proper follow up care.   This document serves as a record of services personally performed by Gardiner Sleeper, MD, PhD. It was created on their behalf by Ernest Mallick, OA, an ophthalmic assistant. The creation of this record is the provider's dictation and/or activities during the visit.    Electronically signed by: Ernest Mallick, OA  07.22.2020 1:09 AM    Gardiner Sleeper, M.D., Ph.D. Diseases & Surgery of the Retina and Vitreous [facility]  I have reviewed the above documentation for accuracy and completeness, and I agree with the above. Gardiner Sleeper, M.D., Ph.D. 06/08/19 1:09 AM   Abbreviations: M myopia (nearsighted); A astigmatism; H hyperopia (farsighted); P presbyopia; Mrx spectacle prescription;  CTL contact lenses; OD right eye; OS left eye; OU both eyes  XT exotropia; ET esotropia; PEK punctate epithelial keratitis; PEE punctate epithelial erosions; DES dry eye syndrome; MGD meibomian gland dysfunction; ATs artificial tears; PFAT's preservative free artificial tears; [place] nuclear sclerotic cataract; PSC posterior subcapsular cataract; ERM epi-retinal membrane; PVD posterior vitreous detachment;  RD retinal detachment; DM diabetes mellitus; DR diabetic retinopathy; NPDR non-proliferative diabetic retinopathy; PDR proliferative diabetic retinopathy; CSME clinically significant macular edema; DME diabetic macular edema; dbh dot blot hemorrhages; CWS cotton Carla May spot; POAG primary open angle glaucoma; C/D cup-to-disc ratio; HVF humphrey visual field; GVF goldmann visual field; OCT optical coherence tomography; IOP intraocular pressure;  BRVO Branch retinal vein occlusion; CRVO central retinal vein occlusion; CRAO central retinal artery occlusion; BRAO branch retinal artery occlusion; RT retinal tear; SB scleral buckle; PPV pars plana vitrectomy; VH Vitreous hemorrhage; PRP panretinal laser photocoagulation; IVK intravitreal kenalog; VMT vitreomacular traction; MH Macular hole;  NVD neovascularization of the disc; NVE neovascularization elsewhere; AREDS age related eye disease study; ARMD age related macular degeneration; POAG primary open angle glaucoma; EBMD epithelial/anterior basement membrane dystrophy; ACIOL anterior chamber intraocular lens; IOL intraocular lens; PCIOL posterior chamber intraocular lens; Phaco/IOL phacoemulsification with intraocular lens placement; [place] photorefractive keratectomy; LASIK laser assisted in situ keratomileusis; HTN hypertension; DM diabetes mellitus; COPD chronic obstructive pulmonary disease

## 2019-06-07 ENCOUNTER — Ambulatory Visit (INDEPENDENT_AMBULATORY_CARE_PROVIDER_SITE_OTHER): Payer: Medicare Other | Admitting: Ophthalmology

## 2019-06-07 ENCOUNTER — Other Ambulatory Visit: Payer: Self-pay

## 2019-06-07 ENCOUNTER — Encounter (INDEPENDENT_AMBULATORY_CARE_PROVIDER_SITE_OTHER): Payer: Self-pay | Admitting: Ophthalmology

## 2019-06-07 DIAGNOSIS — H3581 Retinal edema: Secondary | ICD-10-CM | POA: Diagnosis not present

## 2019-06-07 DIAGNOSIS — H40052 Ocular hypertension, left eye: Secondary | ICD-10-CM | POA: Diagnosis not present

## 2019-06-07 DIAGNOSIS — H15092 Other scleritis, left eye: Secondary | ICD-10-CM | POA: Diagnosis not present

## 2019-06-07 DIAGNOSIS — H3322 Serous retinal detachment, left eye: Secondary | ICD-10-CM | POA: Diagnosis not present

## 2019-06-07 DIAGNOSIS — H35352 Cystoid macular degeneration, left eye: Secondary | ICD-10-CM

## 2019-06-07 DIAGNOSIS — H3342 Traction detachment of retina, left eye: Secondary | ICD-10-CM

## 2019-06-07 DIAGNOSIS — Z961 Presence of intraocular lens: Secondary | ICD-10-CM

## 2019-06-07 DIAGNOSIS — Z8669 Personal history of other diseases of the nervous system and sense organs: Secondary | ICD-10-CM

## 2019-06-21 ENCOUNTER — Other Ambulatory Visit (INDEPENDENT_AMBULATORY_CARE_PROVIDER_SITE_OTHER): Payer: Self-pay | Admitting: Ophthalmology

## 2019-06-21 NOTE — Progress Notes (Signed)
[facility] Note  06/22/2019     CHIEF COMPLAINT Patient presents for Retina Follow Up   HISTORY OF PRESENT ILLNESS: Carla May is a 78 y.o. female who presents to the clinic today for:   HPI    Retina Follow Up    In left eye.  This started 2 weeks ago.  Severity is moderate.  Since onset it is stable.  I, the attending physician,  performed the HPI with the patient and updated documentation appropriately.          Comments    F/U retinal detach. OS. Patient states her vision is the same, denies new visual onsets.        Last edited by Bernarda Caffey, MD on 06/22/2019  9:35 AM. (History)    Patient states she has been tracking her lesion and it has gotten better, but it still there, she states she started taking '40mg'$  of PO prednisone this morning  Referring physician: Kelton Pillar, [address],  [place] [id number]  HISTORICAL INFORMATION:   Selected notes from the MEDICAL RECORD NUMBER Referred by Dr. Len Blalock for concern of retinal hole LEE: Mellody Memos) [BCVA: OD: OS:] Ocular Hx- PMH-anxiety, arthritis, HLD    CURRENT MEDICATIONS: Current Outpatient Medications (Ophthalmic Drugs)  Medication Sig  . carboxymethylcellulose (REFRESH PLUS) 0.5 % SOLN Place 1 drop into both eyes 3 (three) times daily as needed (for dry eyes).   . dorzolamide-timolol (COSOPT) 22.3-6.8 MG/ML ophthalmic solution Place 1 drop into the left eye 2 (two) times daily.  . DUREZOL 0.05 % EMUL Place 1 drop into the left eye 3 (three) times daily.  Marland Kitchen PROLENSA 0.07 % SOLN Place 1 drop into the left eye 2 (two) times a day.   No current facility-administered medications for this visit.  (Ophthalmic Drugs)   Current Outpatient Medications (Other)  Medication Sig  . aspirin EC 81 MG tablet Take 81 mg by mouth daily.  Marland Kitchen ibuprofen (ADVIL) 800 MG tablet Take 800 mg by mouth every 8 (eight) hours as needed for headache or moderate pain.   .  Multiple Vitamin (MULTIVITAMIN WITH MINERALS) TABS tablet Take 1 tablet by mouth daily. One-A-Day Multivitamin  . Omega-3 Fatty Acids (FISH OIL PO) Take 1,400 mg by mouth daily.   Marland Kitchen omeprazole (PRILOSEC OTC) 20 MG tablet Take 20 mg by mouth daily.  . predniSONE (DELTASONE) 20 MG tablet Take 3 tablets daily   Current Facility-Administered Medications (Other)  Medication Route  . triamcinolone acetonide (KENALOG-40) injection 4 mg Intravitreal      REVIEW OF SYSTEMS: ROS    Positive for: Eyes   Negative for: Constitutional, Gastrointestinal, Neurological, Skin, Genitourinary, Musculoskeletal, HENT, Endocrine, Cardiovascular, Respiratory, Psychiatric, Allergic/Imm, Heme/Lymph   Last edited by Zenovia Jordan, LPN on 04/24/7947  0:16 AM. (History)       ALLERGIES Allergies  Allergen Reactions  . Atorvastatin Other (See Comments)    Muscle aches/ memory loss  . Penicillins Itching, Nausea Only and Other (See Comments)    Did it involve swelling of the face/tongue/throat, SOB, or low BP? Unknown Did it involve sudden or severe rash/hives, skin peeling, or any reaction on the inside of your mouth or nose? Itching Did you need to seek medical attention at a hospital or doctor's office? No When did it last happen? 1970s If all above answers are "NO", may proceed with cephalosporin use.   . Sulfa Antibiotics Nausea Only  PAST MEDICAL HISTORY Past Medical History:  Diagnosis Date  . Anxiety   . Arthritis   . GERD (gastroesophageal reflux disease)   . History of palpitations   . Hyperlipidemia   . MVP (mitral valve prolapse)   . PONV (postoperative nausea and vomiting)    No problems in 08/2018; however has had previous problems  . Pre-diabetes   . Retinal detachment    OS   Past Surgical History:  Procedure Laterality Date  . ABDOMINAL HYSTERECTOMY  1970's  . CATARACT EXTRACTION    . EYE SURGERY Bilateral   . GAS INSERTION Right 11/26/2014   Procedure: INSERTION OF GAS;   Surgeon: Hayden Pedro, MD;  Location: [place];  Service: Ophthalmology;  Laterality: Right;  C3F8  . GAS/FLUID EXCHANGE Left 08/21/2018   Procedure: GAS/FLUID EXCHANGE;  Surgeon: Bernarda Caffey, MD;  Location: [place];  Service: Ophthalmology;  Laterality: Left;  C3F8  . KNEE ARTHROSCOPY Left   . PHOTOCOAGULATION WITH LASER Right 11/26/2014   Procedure: PHOTOCOAGULATION WITH LASER;  Surgeon: Hayden Pedro, MD;  Location: [place];  Service: Ophthalmology;  Laterality: Right;  . PHOTOCOAGULATION WITH LASER Left 08/21/2018   Procedure: PHOTOCOAGULATION WITH LASER;  Surgeon: Bernarda Caffey, MD;  Location: [place];  Service: Ophthalmology;  Laterality: Left;  . REPAIR OF COMPLEX TRACTION RETINAL DETACHMENT Left 12/06/2018   Procedure: REPAIR OF COMPLEX TRACTION RETINAL DETACHMENT, 25 gauge vitrectomy, endolaser photocoagulation, memebrane peel, perfluoron injection,  and silicone oil;  Surgeon: Bernarda Caffey, MD;  Location: [place];  Service: Ophthalmology;  Laterality: Left;  . RETINAL DETACHMENT SURGERY Left 08/21/2018   SBP - Dr. Adonis Housekeeper  . SCLERAL BUCKLE Right 11/26/2014   Procedure: SCLERAL BUCKLE RIGHT EYE ;  Surgeon: Hayden Pedro, MD;  Location: [place];  Service: Ophthalmology;  Laterality: Right;  . SCLERAL BUCKLE WITH POSSIBLE 25 GAUGE PARS PLANA VITRECTOMY Left 08/21/2018   Procedure: SCLERAL BUCKLE WITH 25 GAUGE PARS PLANA VITRECTOMY;  Surgeon: Bernarda Caffey, MD;  Location: [place];  Service: Ophthalmology;  Laterality: Left;    FAMILY HISTORY Family History  Problem Relation Age of Onset  . Cancer Mother   . Heart failure Father     SOCIAL HISTORY Social History   Tobacco Use  . Smoking status: Former Smoker    Years: 10.00  . Smokeless tobacco: Never Used  . Tobacco comment: quit in 1970's  Substance Use Topics  . Alcohol use: No  . Drug use: No         OPHTHALMIC EXAM:  Base Eye Exam    Visual Acuity (Snellen - Linear)      Right Left   Dist cc 20/40 -1 20/150   Dist  ph cc NI 20/70 +1   Correction: Glasses       Tonometry (Tonopen, 9:32 AM)      Right Left   Pressure 16 18       Pupils      Dark Light Shape React APD   Right 3 2 Round Brisk None   Left 4 3 Round Brisk None       Visual Fields (Counting fingers)      Left Right    Full Full       Extraocular Movement      Right Left    Full, Ortho Full, Ortho       Neuro/Psych    Oriented x3: Yes   Mood/Affect: Normal       Dilation    Both  eyes: 1.0% Mydriacyl, 2.5% Phenylephrine @ 9:32 AM        Slit Lamp and Fundus Exam    Slit Lamp Exam      Right Left   Lids/Lashes Mild Meibomian gland dysfunction, Telangiectasia, Ptosis Ptosis, Dermatochalasis - upper lid   Conjunctiva/Sclera post surgical changes Mild Injection temporally, nodular scleritis ST quad -- 2.5 mm vertically X 3.5 mm horizontal with improved injection (1+ injection today)   Cornea 1-2+ Punctate epithelial erosions, Temporal Well healed cataract wounds, Debris in tear film 2+ inferior Punctate epithelial erosions, mild Debris in tear film   Anterior Chamber Deep and quiet Deep and quiet   Iris Round and dilated Round and dilated   Lens PC IOL in good position, trace PCO PC IOL in good position, trace Posterior capsular opacification   Vitreous Vitreous syneresis, mild pigment in anterior vitreous, Posterior vitreous detachment; scattered vitreous debris Post vitrectomy, silicon oil -- good fill       Fundus Exam      Right Left   Disc Pink and Sharp Sharp rim, mild tilt, temporal pallor   C/D Ratio 0.2 0.2   Macula Blunted foveal reflex, Retinal pigment epithelial mottling, ERM, No heme or edema Flat under oil, Good foveal reflex under oil, no heme   Vessels Vascular attenuation Vascular attenuation   Periphery Attached over scleral buckle, good buckle height, good scarring over buckle good buckle height; good laser surrounding buckle; improved fibrosis; retina attached under oil, retinotomy from 0300-0430 with  good laser surrounding          IMAGING AND PROCEDURES  Imaging and Procedures for '@TODAY'$ @  OCT, Retina - OU - Both Eyes       Right Eye Quality was good. Central Foveal Thickness: 281. Progression has been stable. Findings include normal foveal contour, no IRF, no SRF, epiretinal membrane (stable).   Left Eye Quality was good. Central Foveal Thickness: 232. Progression has improved. Findings include epiretinal membrane, normal foveal contour, no SRF, no IRF.   Notes *Images captured and stored on drive  Diagnosis / Impression:  OD: mild ERM; NFP; no IRF/SRF-stable from prior OS: retina reattached; no IRF/SRF  Clinical management:  See below  Abbreviations: NFP - Normal foveal profile. CME - cystoid macular edema. PED - pigment epithelial detachment. IRF - intraretinal fluid. SRF - subretinal fluid. EZ - ellipsoid zone. ERM - epiretinal membrane. ORA - outer retinal atrophy. ORT - outer retinal tubulation. SRHM - subretinal hyper-reflective material                 ASSESSMENT/PLAN:    ICD-10-CM   1. Left retinal detachment  H33.22   2. 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 Retinal edema  H35.81 OCT, Retina - OU - Both Eyes  6. CME (cystoid macular edema), left  H35.352   7. History of retinal detachment  Z86.69   8. Pseudophakia of both eyes  Z96.1     1-3. Retinal detachment, OS - originally: macula-sparing inferior retinal detachment from 3-6 oclock - large HST at 0500 and 2 small tears at 300 within the detached retina - s/pSBP + PPV/PFC/EL/FAX/14% C3F8 OS, 10.07.19 - progressive fibrosis/PVR just posterior to buckle around 0400 with +SRF tracking posterior to buckle -- focal tractional/PVR detachment OS - s/p DSK/AJ/GOT/LXBWIOMBTD/HR/4163AG silicon oil OS, 5.36.4680 - retina  attachedand in good position -- good buckle  height and laser around retinotomy - OCT shows trace cystic changes OS -- resolved - IOP okay at 18 today - cont Cosopt BID OS - discussed surgery for silicon oil removal -- 25g PPV under general anesthesia -pt wishes to proceed with surgery  - RBA of procedure discussed, questions answered  - informed consent obtained and signed  - surgery scheduled for Thursday, June 28, 2019, 12 noon, [facility] OR 8  4. Nodular scleritis OS - sup temporal quadrant  - pain/discomfort symptoms improved last visit, and now nodular inflammation improved on po prednisone -- tapered to 40 mg as of today  - pt reports minimal side effects on po steroid and is tolerating well             - lab work to rule out rheumatologic cause--all results negative   CBC, CMP   HIV   HLA Panel   ANA   ANCA   ACE, Lysozyme   RF   ESR, CRP - decrease Durezol BID OS / okay to stop Prolensa BID OS (started by JDM) - continue Ibuprofen 800 mg TID  - continue po prednisone '40mg'$  for now through surgery will - pt already on prilosec for GI prophylaxis - continue to monitor  5,6. CME OS  - post-op CME  - initially treated w/ PF and ketorolac -- had interval increase in CME  - s/p sub-tenons kenalog (03.13.20) -- tr cystic changes on OCT resolved today  - continue durezol as above  7. History of retinal detachment OD  - S/P SBP OD 11/26/14 -- Dr. Tempie Hoist  - looks great -- retina in great position  - monitor  8. Pseudophakia OU  - s/p CE/IOL OU ([place])  - doing well  - monitor   Ophthalmic Meds Ordered this visit:  No orders of the defined types were placed in this encounter.      Return in about 1 week (around 06/29/2019) for POV.  There are no Patient Instructions on file for this visit.   Explained the diagnoses, plan, and follow  up with the patient and they expressed understanding.  Patient expressed understanding of the importance of proper follow up care.   This document serves as a record of services personally performed by Gardiner Sleeper, MD, PhD. It was created on their behalf by Ernest Mallick, OA, an ophthalmic assistant. The creation of this record is the provider's dictation and/or activities during the visit.    Electronically signed by: Ernest Mallick, OA  08.06.2020 12:53 AM    Gardiner Sleeper, M.D., Ph.D. Diseases & Surgery of the Retina and Vitreous [facility]  I have reviewed the above documentation for accuracy and completeness, and I agree with the above. Gardiner Sleeper, M.D., Ph.D. 06/24/19 12:53 AM   Abbreviations: M myopia (nearsighted); A astigmatism; H hyperopia (farsighted); P presbyopia; Mrx spectacle prescription;  CTL contact lenses; OD right eye; OS left eye; OU both eyes  XT exotropia; ET esotropia; PEK punctate epithelial keratitis; PEE punctate epithelial erosions; DES dry eye syndrome; MGD meibomian gland dysfunction; ATs artificial tears; PFAT's preservative free artificial tears; [place] nuclear sclerotic cataract; PSC posterior subcapsular cataract; ERM epi-retinal membrane; PVD posterior vitreous detachment; RD retinal detachment; DM diabetes mellitus; DR diabetic retinopathy; NPDR non-proliferative diabetic retinopathy; PDR proliferative diabetic retinopathy; CSME clinically significant macular edema; DME diabetic macular edema; dbh dot blot hemorrhages; CWS cotton wool spot; POAG primary open angle glaucoma; C/D cup-to-disc ratio; HVF humphrey visual field; GVF goldmann  visual field; OCT optical coherence tomography; IOP intraocular pressure; BRVO Branch retinal vein occlusion; CRVO central retinal vein occlusion; CRAO central retinal artery occlusion; BRAO branch retinal artery occlusion; RT retinal tear; SB scleral buckle; PPV pars plana vitrectomy; VH Vitreous  hemorrhage; PRP panretinal laser photocoagulation; IVK intravitreal kenalog; VMT vitreomacular traction; MH Macular hole;  NVD neovascularization of the disc; NVE neovascularization elsewhere; AREDS age related eye disease study; ARMD age related macular degeneration; POAG primary open angle glaucoma; EBMD epithelial/anterior basement membrane dystrophy; ACIOL anterior chamber intraocular lens; IOL intraocular lens; PCIOL posterior chamber intraocular lens; Phaco/IOL phacoemulsification with intraocular lens placement; [place] photorefractive keratectomy; LASIK laser assisted in situ keratomileusis; HTN hypertension; DM diabetes mellitus; COPD chronic obstructive pulmonary disease

## 2019-06-22 ENCOUNTER — Other Ambulatory Visit: Payer: Self-pay

## 2019-06-22 ENCOUNTER — Encounter (INDEPENDENT_AMBULATORY_CARE_PROVIDER_SITE_OTHER): Payer: Self-pay | Admitting: Ophthalmology

## 2019-06-22 ENCOUNTER — Ambulatory Visit (INDEPENDENT_AMBULATORY_CARE_PROVIDER_SITE_OTHER): Payer: Medicare Other | Admitting: Ophthalmology

## 2019-06-22 DIAGNOSIS — Z961 Presence of intraocular lens: Secondary | ICD-10-CM

## 2019-06-22 DIAGNOSIS — H15092 Other scleritis, left eye: Secondary | ICD-10-CM

## 2019-06-22 DIAGNOSIS — H3322 Serous retinal detachment, left eye: Secondary | ICD-10-CM | POA: Diagnosis not present

## 2019-06-22 DIAGNOSIS — H3342 Traction detachment of retina, left eye: Secondary | ICD-10-CM | POA: Diagnosis not present

## 2019-06-22 DIAGNOSIS — Z8669 Personal history of other diseases of the nervous system and sense organs: Secondary | ICD-10-CM

## 2019-06-22 DIAGNOSIS — H3581 Retinal edema: Secondary | ICD-10-CM

## 2019-06-22 DIAGNOSIS — H35352 Cystoid macular degeneration, left eye: Secondary | ICD-10-CM

## 2019-06-22 DIAGNOSIS — H40052 Ocular hypertension, left eye: Secondary | ICD-10-CM

## 2019-06-24 ENCOUNTER — Encounter (INDEPENDENT_AMBULATORY_CARE_PROVIDER_SITE_OTHER): Payer: Self-pay | Admitting: Ophthalmology

## 2019-06-25 ENCOUNTER — Other Ambulatory Visit (HOSPITAL_COMMUNITY)
Admission: RE | Admit: 2019-06-25 | Discharge: 2019-06-25 | Disposition: A | Discharge status: Home / Self Care | Payer: Medicare Other | Source: Ambulatory Visit | Attending: Ophthalmology | Admitting: Ophthalmology

## 2019-06-25 DIAGNOSIS — Z01812 Encounter for preprocedural laboratory examination: Secondary | ICD-10-CM | POA: Diagnosis present

## 2019-06-25 DIAGNOSIS — Z20828 Contact with and (suspected) exposure to other viral communicable diseases: Secondary | ICD-10-CM | POA: Diagnosis not present

## 2019-06-25 LAB — SARS CORONAVIRUS 2 (TAT 6-24 HRS): SARS Coronavirus 2: NEGATIVE

## 2019-06-25 NOTE — H&P (Signed)
Carla May is an 78 y.o. female.    Chief Complaint: history of retinal detachment with silicon oil, left eye  HPI: Pt has a history of rhegmatogenous retinal detachment OS, s/p scleral buckle + PPV w/ gas on 08/21/2018 and then a redetachment s/p PPV w/ silicon oil on 8.84.1660. Pt has done well with retina remaining attached and after an extensive discussion of the risks, benefits and alternatives to surgery elects to proceed with surgery to remove silicon oil OS under general anesthesia.  Past Medical History:  Diagnosis Date  . Anxiety   . Arthritis   . GERD (gastroesophageal reflux disease)   . History of palpitations   . Hyperlipidemia   . MVP (mitral valve prolapse)   . PONV (postoperative nausea and vomiting)    No problems in 08/2018; however has had previous problems  . Pre-diabetes   . Retinal detachment    OS    Past Surgical History:  Procedure Laterality Date  . ABDOMINAL HYSTERECTOMY  1970's  . CATARACT EXTRACTION    . EYE SURGERY Bilateral   . GAS INSERTION Right 11/26/2014   Procedure: INSERTION OF GAS;  Surgeon: Hayden Pedro, MD;  Location: [place];  Service: Ophthalmology;  Laterality: Right;  C3F8  . GAS/FLUID EXCHANGE Left 08/21/2018   Procedure: GAS/FLUID EXCHANGE;  Surgeon: Bernarda Caffey, MD;  Location: [place];  Service: Ophthalmology;  Laterality: Left;  C3F8  . KNEE ARTHROSCOPY Left   . PHOTOCOAGULATION WITH LASER Right 11/26/2014   Procedure: PHOTOCOAGULATION WITH LASER;  Surgeon: Hayden Pedro, MD;  Location: [place];  Service: Ophthalmology;  Laterality: Right;  . PHOTOCOAGULATION WITH LASER Left 08/21/2018   Procedure: PHOTOCOAGULATION WITH LASER;  Surgeon: Bernarda Caffey, MD;  Location: [place];  Service: Ophthalmology;  Laterality: Left;  . REPAIR OF COMPLEX TRACTION RETINAL DETACHMENT Left 12/06/2018   Procedure: REPAIR OF COMPLEX TRACTION RETINAL DETACHMENT, 25 gauge vitrectomy, endolaser photocoagulation, memebrane peel, perfluoron injection,  and  silicone oil;  Surgeon: Bernarda Caffey, MD;  Location: [place];  Service: Ophthalmology;  Laterality: Left;  . RETINAL DETACHMENT SURGERY Left 08/21/2018   SBP - Dr. Adonis Housekeeper  . SCLERAL BUCKLE Right 11/26/2014   Procedure: SCLERAL BUCKLE RIGHT EYE ;  Surgeon: Hayden Pedro, MD;  Location: [place];  Service: Ophthalmology;  Laterality: Right;  . SCLERAL BUCKLE WITH POSSIBLE 25 GAUGE PARS PLANA VITRECTOMY Left 08/21/2018   Procedure: SCLERAL BUCKLE WITH 25 GAUGE PARS PLANA VITRECTOMY;  Surgeon: Bernarda Caffey, MD;  Location: [place];  Service: Ophthalmology;  Laterality: Left;    Family History  Problem Relation Age of Onset  . Cancer Mother   . Heart failure Father    Social History:  reports that she has quit smoking. She quit after 10.00 years of use. She has never used smokeless tobacco. She reports that she does not drink alcohol or use drugs.  Allergies:  Allergies  Allergen Reactions  . Atorvastatin Other (See Comments)    Muscle aches/ memory loss  . Penicillins Itching, Nausea Only and Other (See Comments)    Did it involve swelling of the face/tongue/throat, SOB, or low BP? Unknown Did it involve sudden or severe rash/hives, skin peeling, or any reaction on the inside of your mouth or nose? Itching Did you need to seek medical attention at a hospital or doctor's office? No When did it last happen? 1970s If all above answers are "NO", may proceed with cephalosporin use.   . Sulfa Antibiotics Nausea Only  No medications prior to admission.    Review of systems otherwise negative  There were no vitals taken for this visit.  Physical exam: Mental status: oriented x3. Eyes: See eye exam associated with this date of surgery Ears, Nose, Throat: within normal limits Neck: Within Normal limits General: within normal limits Chest: Within normal limits Breast: deferred Heart: Within normal limits Abdomen: Within normal limits GU: deferred Extremities: within normal  limits Skin: within normal limits  Assessment/Plan 1. History of retinal detachment with silicon oil OS  [facility] w/ silicon oil removal, OS under general anesthesia. - case scheduled for 8.13.2020, 1230 pm, [facility] OR 08  Karie ChimeraBrian G. Darrio Bade, M.D., Ph.D. Vitreoretinal Surgeon [facility]

## 2019-06-27 ENCOUNTER — Encounter (HOSPITAL_COMMUNITY): Payer: Self-pay | Admitting: *Deleted

## 2019-06-27 ENCOUNTER — Other Ambulatory Visit: Payer: Self-pay

## 2019-06-27 NOTE — Progress Notes (Signed)
Patient denies shortness of breath, fever, cough and chest pain.  PCP - Dr Kelton Pillar Cardiologist - Denies  Chest x-ray - Denies EKG - 08/21/18 Stress Test - Denies ECHO - Denies Cardiac Cath - Denies  Aspirin Instructions: last dose 06/07/19  Anesthesia review: Jeneen Rinks, [place] reviewed medication-prednisone.  Per Jeneen Rinks, [place] ok to take prednisone on DOS.  STOP now taking any Aspirin (unless otherwise instructed by your surgeon), Aleve, Naproxen, Ibuprofen, Motrin, Advil, Goody's, BC's, all herbal medications, fish oil, and all vitamins.   Coronavirus Screening Have you or your daughter experienced the following symptoms:  Cough yes/no: No Fever (>100.71F)  yes/no: No Runny nose yes/no: No Sore throat yes/no: No Difficulty breathing/shortness of breath  yes/no: No  Have you or your daughter traveled in the last 14 days and where? yes/no: No

## 2019-06-27 NOTE — Progress Notes (Signed)
[facility] Note  06/29/2019     CHIEF COMPLAINT Patient presents for Post-op Follow-up   HISTORY OF PRESENT ILLNESS: Carla May is a 78 y.o. female who presents to the clinic today for:   HPI    Post-op Follow-up    In left eye.  Discomfort includes Negative for pain, itching, foreign body sensation, tearing, discharge, floaters and none.  Vision is blurred at distance and is blurred at near.  I, the attending physician,  performed the HPI with the patient and updated documentation appropriately.          Comments    Patient states no pain/discomfort.        Last edited by Bernarda Caffey, MD on 06/29/2019  8:19 AM. (History)    Patient states she is not in any pain, she states she can already see much better   Referring physician: Kelton Pillar, [address] [place],  [place] [address]  HISTORICAL INFORMATION:   Selected notes from the MEDICAL RECORD NUMBER Referred by Dr. Len Blalock for concern of retinal hole LEE: Mellody Memos) [BCVA: OD: OS:] Ocular Hx- PMH-anxiety, arthritis, HLD    CURRENT MEDICATIONS: Current Outpatient Medications (Ophthalmic Drugs)  Medication Sig  . carboxymethylcellulose (REFRESH PLUS) 0.5 % SOLN Place 1 drop into both eyes 3 (three) times daily as needed (for dry eyes).   . dorzolamide-timolol (COSOPT) 22.3-6.8 MG/ML ophthalmic solution Place 1 drop into the left eye 2 (two) times daily.  . DUREZOL 0.05 % EMUL Place 1 drop into the left eye 3 (three) times daily. (Patient not taking: Reported on 06/29/2019)  . PROLENSA 0.07 % SOLN Place 1 drop into the left eye 2 (two) times a day. (Patient not taking: Reported on 06/26/2019)   No current facility-administered medications for this visit.  (Ophthalmic Drugs)   Current Outpatient Medications (Other)  Medication Sig  . aspirin EC 81 MG tablet Take 81 mg by mouth daily.  . Multiple Vitamin (MULTIVITAMIN WITH MINERALS) TABS tablet Take 1  tablet by mouth daily. One-A-Day for Women 50+  . nabumetone (RELAFEN) 500 MG tablet Take 750 mg by mouth 3 (three) times daily.  . Omega-3 Fatty Acids (FISH OIL ULTRA) 1400 MG CAPS Take 1,400 mg by mouth daily.  Marland Kitchen omeprazole (PRILOSEC OTC) 20 MG tablet Take 20 mg by mouth daily.  . predniSONE (DELTASONE) 20 MG tablet Take 2 tablets (40 mg total) by mouth daily.  Marland Kitchen ibuprofen (ADVIL) 800 MG tablet Take 800 mg by mouth every 8 (eight) hours as needed.   Current Facility-Administered Medications (Other)  Medication Route  . triamcinolone acetonide (KENALOG-40) injection 4 mg Intravitreal      REVIEW OF SYSTEMS: ROS    Positive for: Gastrointestinal, Musculoskeletal, Eyes   Negative for: Constitutional, Neurological, Skin, Genitourinary, HENT, Endocrine, Cardiovascular, Respiratory, Psychiatric, Allergic/Imm, Heme/Lymph   Last edited by Roselee Nova D on 06/29/2019  8:05 AM. (History)       ALLERGIES Allergies  Allergen Reactions  . Atorvastatin Other (See Comments)    Muscle aches/ memory loss  . Penicillins Itching, Nausea Only and Other (See Comments)    Did it involve swelling of the face/tongue/throat, SOB, or low BP? Unknown Did it involve sudden or severe rash/hives, skin peeling, or any reaction on the inside of your mouth or nose? Itching Did you need to seek medical attention at a hospital or doctor's office? No When did it last happen? 1970s If all above answers  are "NO", may proceed with cephalosporin use.   . Sulfa Antibiotics Nausea Only    PAST MEDICAL HISTORY Past Medical History:  Diagnosis Date  . Anxiety   . Arthritis    back. neck  . Cataracts, bilateral    removed by surgery  . GERD (gastroesophageal reflux disease)   . History of palpitations   . HOH (hard of hearing) 06/28/2019  . Hyperlipidemia    diet controlled and fish oil  . MVP (mitral valve prolapse)    never has caused any problems per patient on 06/27/19  . PONV (postoperative nausea and  vomiting)    No problems in 08/2018; however has had previous problems  . Pre-diabetes    diet controlled, no med  . Retinal detachment    OS  . SVD (spontaneous vaginal delivery)    x 2   Past Surgical History:  Procedure Laterality Date  . ABDOMINAL HYSTERECTOMY  1970's  . CATARACT EXTRACTION    . COLONOSCOPY     polyp  . EYE SURGERY Bilateral   . GAS INSERTION Right 11/26/2014   Procedure: INSERTION OF GAS;  Surgeon: Hayden Pedro, MD;  Location: [facility];  Service: Ophthalmology;  Laterality: Right;  C3F8  . GAS/FLUID EXCHANGE Left 08/21/2018   Procedure: GAS/FLUID EXCHANGE;  Surgeon: Bernarda Caffey, MD;  Location: [facility];  Service: Ophthalmology;  Laterality: Left;  C3F8  . KNEE ARTHROSCOPY Left   . PHOTOCOAGULATION WITH LASER Right 11/26/2014   Procedure: PHOTOCOAGULATION WITH LASER;  Surgeon: Hayden Pedro, MD;  Location: [facility];  Service: Ophthalmology;  Laterality: Right;  . PHOTOCOAGULATION WITH LASER Left 08/21/2018   Procedure: PHOTOCOAGULATION WITH LASER;  Surgeon: Bernarda Caffey, MD;  Location: [facility];  Service: Ophthalmology;  Laterality: Left;  . REPAIR OF COMPLEX TRACTION RETINAL DETACHMENT Left 12/06/2018   Procedure: REPAIR OF COMPLEX TRACTION RETINAL DETACHMENT, 25 gauge vitrectomy, endolaser photocoagulation, memebrane peel, perfluoron injection,  and silicone oil;  Surgeon: Bernarda Caffey, MD;  Location: [facility];  Service: Ophthalmology;  Laterality: Left;  . RETINAL DETACHMENT SURGERY Left 08/21/2018   SBP - Dr. Adonis Housekeeper  . SCLERAL BUCKLE Right 11/26/2014   Procedure: SCLERAL BUCKLE RIGHT EYE ;  Surgeon: Hayden Pedro, MD;  Location: [facility];  Service: Ophthalmology;  Laterality: Right;  . SCLERAL BUCKLE WITH POSSIBLE 25 GAUGE PARS PLANA VITRECTOMY Left 08/21/2018   Procedure: SCLERAL BUCKLE WITH 25 GAUGE PARS PLANA VITRECTOMY;  Surgeon: Bernarda Caffey, MD;  Location: [facility];  Service: Ophthalmology;  Laterality: Left;  . TONSILLECTOMY      FAMILY HISTORY Family  History  Problem Relation Age of Onset  . Cancer Mother   . Heart failure Father     SOCIAL HISTORY Social History   Tobacco Use  . Smoking status: Former Smoker    Packs/day: 0.50    Years: 10.00    Pack years: 5.00    Types: Cigarettes    Quit date: 1970    Years since quitting: 50.6  . Smokeless tobacco: Never Used  . Tobacco comment: quit in 1970's  Substance Use Topics  . Alcohol use: No  . Drug use: No         OPHTHALMIC EXAM:  Base Eye Exam    Visual Acuity (Snellen - Linear)      Right Left   Dist [place] 20/50 -2 20/200   Dist ph  20/30 -2 20/80 -2       Tonometry (Tonopen, 8:16 AM)  Right Left   Pressure 11 13       Pupils      Dark Light Shape React APD   Right 4 3 Round Brisk None   Left 8 8 Round None None       Neuro/Psych    Oriented x3: Yes   Mood/Affect: Normal       Dilation    Left eye: 1.0% Mydriacyl, 2.5% Phenylephrine @ 8:17 AM        Slit Lamp and Fundus Exam    Slit Lamp Exam      Right Left   Lids/Lashes Mild Meibomian gland dysfunction, Telangiectasia, Ptosis Ptosis, Dermatochalasis - upper lid   Conjunctiva/Sclera post surgical changes Mild Injection, nodular scleritis ST quad -- 2.5 mm vertically X 3.5 mm horizontal with improved injection (1+ injection today), sutures intact   Cornea 1-2+ Punctate epithelial erosions, Temporal Well healed cataract wounds, Debris in tear film 2+ inferior Punctate epithelial erosions, mild Debris in tear film   Anterior Chamber Deep and quiet Deep, 2-3+ cell/pigment   Iris Round and dilated Round and dilated   Lens PC IOL in good position, trace PCO PC IOL in good position, trace Posterior capsular opacification   Vitreous Vitreous syneresis, mild pigment in anterior vitreous, Posterior vitreous detachment; scattered vitreous debris Post vitrectomy, clear, silicon oil removed, 8-2+XHBZJIR, 30-35% air bubble       Fundus Exam      Right Left   Disc  Sharp rim, mild tilt, temporal pallor    C/D Ratio 0.2 0.3   Macula  Flat, blunted foveal reflex, mild epiretinal membrane, mild Retinal pigment epithelial mottling   Vessels  Vascular attenuation, mild Tortuousity   Periphery  Retina attached over buckle; good buckle height; good laser surrounding buckle w/ new row posterior to buckle; stably improved fibrosis; retinotomy from 0300-0430 with good laser surrounding          IMAGING AND PROCEDURES  Imaging and Procedures for _0 @           ASSESSMENT/PLAN:    ICD-10-CM   1. Left retinal detachment  H33.22   2. 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 Retinal edema  H35.81   6. CME (cystoid macular edema), left  H35.352   7. History of retinal detachment  Z86.69   8. Pseudophakia of both eyes  Z96.1     1-3. Retinal detachment, OS - originally: macula-sparing inferior retinal detachment from 3-6 oclock - large HST at 0500 and 2 small tears at 300 within the detached retina - s/pSBP + PPV/PFC/EL/FAX/14% C3F8 OS, 10.07.19 - progressive fibrosis/PVR just posterior to buckle around 0400 with +SRF tracking posterior to buckle -- focal tractional/PVR detachment OS - s/p CVE/LF/YBO/FBPZWCHENI/DP/8242PN silicon oil OS, 3.61.4431  - now POD1 s/p 54M PPV with silicon oil removal (08.67.61)  - retina attachedand in good position -- good buckle height and laser over buckle and around retinotomy - IOP okay at 13 today  - 30-35% air bubble             - start   PF 4x/day OS                         zymaxid QID OS  Cosopt BID OS                         PSO ung QID OS             - cont face down positioning x3 days; avoid laying flat on back             - eye shield when sleeping             - post op drop and positioning instructions reviewed             - tylenol/ibuprofen  for pain             - Rx given for breakthrough pain  - f/u 1 week  4. Nodular scleritis OS - sup temporal quadrant  - pain/discomfort symptoms stably improved and nodular inflammation continues to improve on po prednisone -- tapered to 40 mg as of today  - pt reports minimal side effects on po steroid and is tolerating well             - lab work to rule out rheumatologic cause--all results negative   CBC, CMP   HIV   HLA Panel   ANA   ANCA   ACE, Lysozyme   RF   ESR, CRP  - continues to improve on PO prednisone - stop Durezol BID OS / stopped Prolensa BID OS (started by JDM) - d/c Ibuprofen 800 mg TID  - continue po prednisone 1m for now through immediate post op period - pt already on prilosec for GI prophylaxis - continue to monitor  5,6. CME OS  - post-op CME  - initially treated w/ PF and ketorolac -- had interval increase in CME  - s/p sub-tenons kenalog (03.13.20)  - stop durezol as above as now on PF  7. History of retinal detachment OD  - S/P SBP OD 11/26/14 -- Dr. JTempie Hoist - looks great -- retina in great position  - monitor  8. Pseudophakia OU  - s/p CE/IOL OU ([facility])  - doing well  - monitor   Ophthalmic Meds Ordered this visit:  Meds ordered this encounter  Medications  . predniSONE (DELTASONE) 20 MG tablet    Sig: Take 2 tablets (40 mg total) by mouth daily.    Dispense:  60 tablet    Refill:  0       Return in about 1 week (around 07/06/2019) for s/p PPV with silicone oil removal, POV.  There are no Patient Instructions on file for this visit.   Explained the diagnoses, plan, and follow up with the patient and they expressed understanding.  Patient expressed understanding of the importance of proper follow up care.   This document serves as a record of services personally performed by BGardiner Sleeper MD, PhD. It was created on their behalf by AErnest Mallick OA, an ophthalmic  assistant. The creation of this record is the provider's dictation and/or activities during the visit.    Electronically signed by: AErnest Mallick OA  08.12.2020 1:26 PM    BGardiner Sleeper M.D., Ph.D. Diseases & Surgery of the Retina and Vitreous [facility] I have reviewed the above documentation for accuracy and completeness, and I agree with the above. BGardiner Sleeper M.D., Ph.D. 06/29/19 1:26 PM    Abbreviations: M myopia (nearsighted); A astigmatism; H hyperopia (farsighted); P presbyopia; Mrx spectacle prescription;  CTL contact lenses; OD right  eye; OS left eye; OU both eyes  XT exotropia; ET esotropia; PEK punctate epithelial keratitis; PEE punctate epithelial erosions; DES dry eye syndrome; MGD meibomian gland dysfunction; ATs artificial tears; PFAT's preservative free artificial tears; [place] nuclear sclerotic cataract; PSC posterior subcapsular cataract; ERM epi-retinal membrane; PVD posterior vitreous detachment; RD retinal detachment; DM diabetes mellitus; DR diabetic retinopathy; NPDR non-proliferative diabetic retinopathy; PDR proliferative diabetic retinopathy; CSME clinically significant macular edema; DME diabetic macular edema; dbh dot blot hemorrhages; CWS cotton wool spot; POAG primary open angle glaucoma; C/D cup-to-disc ratio; HVF humphrey visual field; GVF goldmann visual field; OCT optical coherence tomography; IOP intraocular pressure; BRVO Branch retinal vein occlusion; CRVO central retinal vein occlusion; CRAO central retinal artery occlusion; BRAO branch retinal artery occlusion; RT retinal tear; SB scleral buckle; PPV pars plana vitrectomy; VH Vitreous hemorrhage; PRP panretinal laser photocoagulation; IVK intravitreal kenalog; VMT vitreomacular traction; MH Macular hole;  NVD neovascularization of the disc; NVE neovascularization elsewhere; AREDS age related eye disease study; ARMD age related macular degeneration; POAG primary open angle glaucoma;  EBMD epithelial/anterior basement membrane dystrophy; ACIOL anterior chamber intraocular lens; IOL intraocular lens; PCIOL posterior chamber intraocular lens; Phaco/IOL phacoemulsification with intraocular lens placement; Grampian photorefractive keratectomy; LASIK laser assisted in situ keratomileusis; HTN hypertension; DM diabetes mellitus; COPD chronic obstructive pulmonary disease

## 2019-06-28 ENCOUNTER — Ambulatory Visit (HOSPITAL_COMMUNITY): Payer: Medicare Other | Admitting: Physician Assistant

## 2019-06-28 ENCOUNTER — Other Ambulatory Visit: Payer: Self-pay

## 2019-06-28 ENCOUNTER — Encounter (HOSPITAL_COMMUNITY)
Admission: RE | Disposition: A | Discharge status: Home / Self Care | Payer: Self-pay | Source: Home / Self Care | Attending: Ophthalmology

## 2019-06-28 ENCOUNTER — Ambulatory Visit (HOSPITAL_COMMUNITY)
Admission: RE | Admit: 2019-06-28 | Discharge: 2019-06-28 | Disposition: A | Discharge status: Home / Self Care | Payer: Medicare Other | Attending: Ophthalmology | Admitting: Ophthalmology

## 2019-06-28 ENCOUNTER — Encounter (HOSPITAL_COMMUNITY): Payer: Self-pay

## 2019-06-28 DIAGNOSIS — H3322 Serous retinal detachment, left eye: Secondary | ICD-10-CM | POA: Insufficient documentation

## 2019-06-28 DIAGNOSIS — E785 Hyperlipidemia, unspecified: Secondary | ICD-10-CM | POA: Insufficient documentation

## 2019-06-28 DIAGNOSIS — H338 Other retinal detachments: Secondary | ICD-10-CM | POA: Diagnosis not present

## 2019-06-28 DIAGNOSIS — I341 Nonrheumatic mitral (valve) prolapse: Secondary | ICD-10-CM | POA: Diagnosis not present

## 2019-06-28 DIAGNOSIS — K219 Gastro-esophageal reflux disease without esophagitis: Secondary | ICD-10-CM | POA: Insufficient documentation

## 2019-06-28 DIAGNOSIS — F419 Anxiety disorder, unspecified: Secondary | ICD-10-CM | POA: Insufficient documentation

## 2019-06-28 DIAGNOSIS — Z87891 Personal history of nicotine dependence: Secondary | ICD-10-CM | POA: Insufficient documentation

## 2019-06-28 DIAGNOSIS — M1990 Unspecified osteoarthritis, unspecified site: Secondary | ICD-10-CM | POA: Diagnosis not present

## 2019-06-28 DIAGNOSIS — R7303 Prediabetes: Secondary | ICD-10-CM | POA: Insufficient documentation

## 2019-06-28 DIAGNOSIS — H9190 Unspecified hearing loss, unspecified ear: Secondary | ICD-10-CM

## 2019-06-28 HISTORY — PX: AIR/FLUID EXCHANGE: SHX6494

## 2019-06-28 HISTORY — PX: LASER PHOTO ABLATION: SHX5942

## 2019-06-28 HISTORY — DX: Unspecified hearing loss, unspecified ear: H91.90

## 2019-06-28 HISTORY — PX: SILICON OIL REMOVAL: SHX5305

## 2019-06-28 HISTORY — PX: PARS PLANA VITRECTOMY: SHX2166

## 2019-06-28 SURGERY — PARS PLANA VITRECTOMY WITH 25 GAUGE
Anesthesia: General | Site: Eye | Laterality: Left

## 2019-06-28 MED ORDER — PROPARACAINE HCL 0.5 % OP SOLN
1.0000 [drp] | OPHTHALMIC | Status: AC | PRN
  Administered 2019-06-28 (×3): 1 [drp] via OPHTHALMIC
  Filled 2019-06-28: qty 15

## 2019-06-28 MED ORDER — ATROPINE SULFATE 1 % OP SOLN
1.0000 [drp] | OPHTHALMIC | Status: AC | PRN
  Administered 2019-06-28 (×3): 1 [drp] via OPHTHALMIC
  Filled 2019-06-28: qty 2

## 2019-06-28 MED ORDER — BALANCED SALT IO SOLN
INTRAOCULAR | Status: DC | PRN
  Administered 2019-06-28: 15 mL via INTRAOCULAR

## 2019-06-28 MED ORDER — BACITRACIN-POLYMYXIN B 500-10000 UNIT/GM OP OINT
TOPICAL_OINTMENT | OPHTHALMIC | Status: AC
  Filled 2019-06-28: qty 3.5

## 2019-06-28 MED ORDER — BACITRACIN-POLYMYXIN B 500-10000 UNIT/GM OP OINT
TOPICAL_OINTMENT | OPHTHALMIC | Status: DC | PRN
  Administered 2019-06-28: 1 via OPHTHALMIC

## 2019-06-28 MED ORDER — OXYCODONE HCL 5 MG PO TABS: 5.0000 mg | ORAL_TABLET | Freq: Once | ORAL | Status: DC | PRN

## 2019-06-28 MED ORDER — ONDANSETRON HCL 4 MG/2ML IJ SOLN: 4.0000 mg | Freq: Once | INTRAMUSCULAR | Status: DC | PRN

## 2019-06-28 MED ORDER — OXYCODONE HCL 5 MG/5ML PO SOLN: 5.0000 mg | Freq: Once | ORAL | Status: DC | PRN

## 2019-06-28 MED ORDER — INDOCYANINE GREEN 25 MG IV SOLR
INTRAVENOUS | Status: AC
  Filled 2019-06-28: qty 25

## 2019-06-28 MED ORDER — TOBRAMYCIN-DEXAMETHASONE 0.3-0.1 % OP OINT
TOPICAL_OINTMENT | OPHTHALMIC | Status: AC
  Filled 2019-06-28: qty 3.5

## 2019-06-28 MED ORDER — PHENYLEPHRINE HCL 10 % OP SOLN
1.0000 [drp] | OPHTHALMIC | Status: AC | PRN
  Administered 2019-06-28 (×3): 1 [drp] via OPHTHALMIC
  Filled 2019-06-28: qty 5

## 2019-06-28 MED ORDER — ROCURONIUM BROMIDE 10 MG/ML (PF) SYRINGE
PREFILLED_SYRINGE | INTRAVENOUS | Status: DC | PRN
  Administered 2019-06-28: 50 mg via INTRAVENOUS
  Administered 2019-06-28 (×2): 10 mg via INTRAVENOUS

## 2019-06-28 MED ORDER — LIDOCAINE HCL (PF) 2 % IJ SOLN
INTRAMUSCULAR | Status: AC
  Filled 2019-06-28: qty 10

## 2019-06-28 MED ORDER — FENTANYL CITRATE (PF) 100 MCG/2ML IJ SOLN: 25.0000 ug | INTRAMUSCULAR | Status: DC | PRN

## 2019-06-28 MED ORDER — ATROPINE SULFATE 1 % OP SOLN
OPHTHALMIC | Status: AC
  Filled 2019-06-28: qty 5

## 2019-06-28 MED ORDER — SODIUM CHLORIDE 0.9 % IV SOLN
INTRAVENOUS | Status: DC
  Administered 2019-06-28 (×3): via INTRAVENOUS

## 2019-06-28 MED ORDER — BUPIVACAINE HCL (PF) 0.75 % IJ SOLN
INTRAMUSCULAR | Status: AC
  Filled 2019-06-28: qty 10

## 2019-06-28 MED ORDER — DORZOLAMIDE HCL-TIMOLOL MAL 2-0.5 % OP SOLN
OPHTHALMIC | Status: DC | PRN
  Administered 2019-06-28: 1 [drp] via OPHTHALMIC

## 2019-06-28 MED ORDER — FENTANYL CITRATE (PF) 250 MCG/5ML IJ SOLN
INTRAMUSCULAR | Status: DC | PRN
  Administered 2019-06-28 (×4): 50 ug via INTRAVENOUS

## 2019-06-28 MED ORDER — CARBACHOL 0.01 % IO SOLN
INTRAOCULAR | Status: AC
  Filled 2019-06-28: qty 1.5

## 2019-06-28 MED ORDER — PREDNISOLONE ACETATE 1 % OP SUSP
OPHTHALMIC | Status: AC
  Filled 2019-06-28: qty 5

## 2019-06-28 MED ORDER — BSS IO SOLN
INTRAOCULAR | Status: AC
  Filled 2019-06-28: qty 15

## 2019-06-28 MED ORDER — CEFTAZIDIME 1 G IJ SOLR
INTRAMUSCULAR | Status: AC
  Filled 2019-06-28: qty 1

## 2019-06-28 MED ORDER — ACETAMINOPHEN 325 MG PO TABS
650.0000 mg | ORAL_TABLET | Freq: Once | ORAL | Status: AC
  Administered 2019-06-28: 650 mg via ORAL

## 2019-06-28 MED ORDER — LIDOCAINE 2% (20 MG/ML) 5 ML SYRINGE
INTRAMUSCULAR | Status: AC
  Filled 2019-06-28: qty 15

## 2019-06-28 MED ORDER — NA CHONDROIT SULF-NA HYALURON 40-30 MG/ML IO SOLN
INTRAOCULAR | Status: AC
  Filled 2019-06-28: qty 1

## 2019-06-28 MED ORDER — STERILE WATER FOR INJECTION IJ SOLN
INTRAMUSCULAR | Status: DC | PRN
  Administered 2019-06-28: 200 mL

## 2019-06-28 MED ORDER — TROPICAMIDE 1 % OP SOLN
1.0000 [drp] | OPHTHALMIC | Status: AC | PRN
  Administered 2019-06-28 (×3): 1 [drp] via OPHTHALMIC
  Filled 2019-06-28: qty 15

## 2019-06-28 MED ORDER — TRIAMCINOLONE ACETONIDE 40 MG/ML IJ SUSP
INTRAMUSCULAR | Status: AC
  Filled 2019-06-28: qty 5

## 2019-06-28 MED ORDER — STERILE WATER FOR INJECTION IJ SOLN
INTRAMUSCULAR | Status: AC
  Filled 2019-06-28: qty 10

## 2019-06-28 MED ORDER — GATIFLOXACIN 0.5 % OP SOLN OPTIME - NO CHARGE
OPHTHALMIC | Status: DC | PRN
  Administered 2019-06-28: 1 [drp] via OPHTHALMIC

## 2019-06-28 MED ORDER — PROPOFOL 10 MG/ML IV BOLUS
INTRAVENOUS | Status: DC | PRN
  Administered 2019-06-28: 150 mg via INTRAVENOUS
  Administered 2019-06-28: 30 mg via INTRAVENOUS

## 2019-06-28 MED ORDER — POLYMYXIN B SULFATE 500000 UNITS IJ SOLR
INTRAMUSCULAR | Status: AC
  Filled 2019-06-28: qty 500000

## 2019-06-28 MED ORDER — SODIUM CHLORIDE (PF) 0.9 % IJ SOLN
INTRAMUSCULAR | Status: AC
  Filled 2019-06-28: qty 10

## 2019-06-28 MED ORDER — SUCCINYLCHOLINE CHLORIDE 200 MG/10ML IV SOSY
PREFILLED_SYRINGE | INTRAVENOUS | Status: AC
  Filled 2019-06-28: qty 30

## 2019-06-28 MED ORDER — BRIMONIDINE TARTRATE 0.2 % OP SOLN
OPHTHALMIC | Status: DC | PRN
  Administered 2019-06-28: 1 [drp] via OPHTHALMIC

## 2019-06-28 MED ORDER — ARTIFICIAL TEARS OPHTHALMIC OINT
TOPICAL_OINTMENT | OPHTHALMIC | Status: AC
  Filled 2019-06-28: qty 7

## 2019-06-28 MED ORDER — TRIAMCINOLONE ACETONIDE 40 MG/ML IJ SUSP
INTRAMUSCULAR | Status: DC | PRN
  Administered 2019-06-28: 80 mg

## 2019-06-28 MED ORDER — DEXAMETHASONE SODIUM PHOSPHATE 10 MG/ML IJ SOLN
INTRAMUSCULAR | Status: AC
  Filled 2019-06-28: qty 4

## 2019-06-28 MED ORDER — ACETAMINOPHEN 325 MG PO TABS
ORAL_TABLET | ORAL | Status: AC
  Filled 2019-06-28: qty 2

## 2019-06-28 MED ORDER — SUGAMMADEX SODIUM 200 MG/2ML IV SOLN
INTRAVENOUS | Status: DC | PRN
  Administered 2019-06-28: 150 mg via INTRAVENOUS

## 2019-06-28 MED ORDER — STERILE WATER FOR INJECTION IJ SOLN
INTRAMUSCULAR | Status: DC | PRN
  Administered 2019-06-28: 20 mL

## 2019-06-28 MED ORDER — MIDAZOLAM HCL 5 MG/5ML IJ SOLN
INTRAMUSCULAR | Status: DC | PRN
  Administered 2019-06-28: 1 mg via INTRAVENOUS

## 2019-06-28 MED ORDER — ATROPINE SULFATE 1 % OP SOLN
OPHTHALMIC | Status: DC | PRN
  Administered 2019-06-28: 1 [drp] via OPHTHALMIC

## 2019-06-28 MED ORDER — 0.9 % SODIUM CHLORIDE (POUR BTL) OPTIME
TOPICAL | Status: DC | PRN
  Administered 2019-06-28: 200 mL

## 2019-06-28 MED ORDER — ONDANSETRON HCL 4 MG/2ML IJ SOLN
INTRAMUSCULAR | Status: DC | PRN
  Administered 2019-06-28: 4 mg via INTRAVENOUS

## 2019-06-28 MED ORDER — FENTANYL CITRATE (PF) 250 MCG/5ML IJ SOLN
INTRAMUSCULAR | Status: AC
  Filled 2019-06-28: qty 5

## 2019-06-28 MED ORDER — PROPOFOL 10 MG/ML IV BOLUS
INTRAVENOUS | Status: AC
  Filled 2019-06-28: qty 20

## 2019-06-28 MED ORDER — LIDOCAINE HCL (PF) 4 % IJ SOLN
INTRAMUSCULAR | Status: AC
  Filled 2019-06-28: qty 5

## 2019-06-28 MED ORDER — PREDNISOLONE ACETATE 1 % OP SUSP
OPHTHALMIC | Status: DC | PRN
  Administered 2019-06-28: 1 [drp] via OPHTHALMIC

## 2019-06-28 MED ORDER — EPINEPHRINE PF 1 MG/ML IJ SOLN
INTRAMUSCULAR | Status: AC
  Filled 2019-06-28: qty 1

## 2019-06-28 MED ORDER — LIDOCAINE 2% (20 MG/ML) 5 ML SYRINGE
INTRAMUSCULAR | Status: DC | PRN
  Administered 2019-06-28: 60 mg via INTRAVENOUS

## 2019-06-28 MED ORDER — BSS PLUS IO SOLN
INTRAOCULAR | Status: AC
  Filled 2019-06-28: qty 500

## 2019-06-28 MED ORDER — ROCURONIUM BROMIDE 10 MG/ML (PF) SYRINGE
PREFILLED_SYRINGE | INTRAVENOUS | Status: AC
  Filled 2019-06-28: qty 30

## 2019-06-28 MED ORDER — NA CHONDROIT SULF-NA HYALURON 40-30 MG/ML IO SOLN
INTRAOCULAR | Status: DC | PRN
  Administered 2019-06-28: 0.5 mL via INTRAOCULAR

## 2019-06-28 MED ORDER — BRIMONIDINE TARTRATE 0.2 % OP SOLN
OPHTHALMIC | Status: AC
  Filled 2019-06-28: qty 5

## 2019-06-28 MED ORDER — PROPOFOL 500 MG/50ML IV EMUL
INTRAVENOUS | Status: DC | PRN
  Administered 2019-06-28: 150 ug/kg/min via INTRAVENOUS

## 2019-06-28 MED ORDER — ONDANSETRON HCL 4 MG/2ML IJ SOLN
INTRAMUSCULAR | Status: AC
  Filled 2019-06-28: qty 8

## 2019-06-28 MED ORDER — DEXAMETHASONE SODIUM PHOSPHATE 10 MG/ML IJ SOLN
INTRAMUSCULAR | Status: AC
  Filled 2019-06-28: qty 1

## 2019-06-28 MED ORDER — EPINEPHRINE PF 1 MG/ML IJ SOLN
INTRAOCULAR | Status: DC | PRN
  Administered 2019-06-28: 500 mL

## 2019-06-28 MED ORDER — DEXAMETHASONE SODIUM PHOSPHATE 10 MG/ML IJ SOLN
INTRAMUSCULAR | Status: DC | PRN
  Administered 2019-06-28: 5 mg via INTRAVENOUS

## 2019-06-28 MED ORDER — MIDAZOLAM HCL 2 MG/2ML IJ SOLN
INTRAMUSCULAR | Status: AC
  Filled 2019-06-28: qty 2

## 2019-06-28 MED ORDER — GATIFLOXACIN 0.5 % OP SOLN
OPHTHALMIC | Status: AC
  Filled 2019-06-28: qty 2.5

## 2019-06-28 SURGICAL SUPPLY — 40 items
APPLICATOR COTTON TIP 6 STRL (MISCELLANEOUS) ×2 IMPLANT
APPLICATOR COTTON TIP 6IN STRL (MISCELLANEOUS) ×4
BNDG EYE OVAL (GAUZE/BANDAGES/DRESSINGS) ×2 IMPLANT
CABLE BIPOLOR RESECTION CORD (MISCELLANEOUS) IMPLANT
CANNULA FLEX TIP 25G (CANNULA) ×2 IMPLANT
CLSR STERI-STRIP ANTIMIC 1/2X4 (GAUZE/BANDAGES/DRESSINGS) ×2 IMPLANT
COVER WAND RF STERILE (DRAPES) ×2 IMPLANT
DRAPE MICROSCOPE LEICA 46X105 (MISCELLANEOUS) ×2 IMPLANT
DRAPE OPHTHALMIC 77X100 STRL (CUSTOM PROCEDURE TRAY) ×2 IMPLANT
FILTER BLUE MILLIPORE (MISCELLANEOUS) ×2 IMPLANT
GAUZE SPONGE 4X4 12PLY STRL (GAUZE/BANDAGES/DRESSINGS) ×2 IMPLANT
GLOVE BIO SURGEON STRL SZ7.5 (GLOVE) ×4 IMPLANT
GLOVE BIOGEL M 7.0 STRL (GLOVE) ×2 IMPLANT
GOWN STRL REUS W/ TWL LRG LVL3 (GOWN DISPOSABLE) ×3 IMPLANT
GOWN STRL REUS W/ TWL XL LVL3 (GOWN DISPOSABLE) ×1 IMPLANT
GOWN STRL REUS W/TWL LRG LVL3 (GOWN DISPOSABLE) ×3
GOWN STRL REUS W/TWL XL LVL3 (GOWN DISPOSABLE) ×1
KIT BASIN OR (CUSTOM PROCEDURE TRAY) ×2 IMPLANT
KIT PERFLUORON PROCEDURE 5ML (MISCELLANEOUS) IMPLANT
LENS VITRECTOMY FLAT OCLR DISP (MISCELLANEOUS) IMPLANT
NEEDLE 18GX1X1/2 (RX/OR ONLY) (NEEDLE) ×2 IMPLANT
NEEDLE 25GX 5/8IN NON SAFETY (NEEDLE) ×6 IMPLANT
NEEDLE HYPO 30X.5 LL (NEEDLE) ×4 IMPLANT
NEEDLE PRECISIONGLIDE 27X1.5 (NEEDLE) ×2 IMPLANT
NS IRRIG 1000ML POUR BTL (IV SOLUTION) ×2 IMPLANT
PACK VITRECTOMY CUSTOM (CUSTOM PROCEDURE TRAY) ×2 IMPLANT
PAD ARMBOARD 7.5X6 YLW CONV (MISCELLANEOUS) ×4 IMPLANT
PAK PIK VITRECTOMY CVS 25GA (OPHTHALMIC) ×2 IMPLANT
PROBE LASER ILLUM FLEX CVD 25G (OPHTHALMIC) ×2 IMPLANT
SET INJECTOR OIL FLUID CONSTEL (OPHTHALMIC) ×2 IMPLANT
SPONGE SURGIFOAM ABS GEL 12-7 (HEMOSTASIS) IMPLANT
SUT VICRYL 7 0 TG140 8 (SUTURE) ×2 IMPLANT
SYR 10ML LL (SYRINGE) ×2 IMPLANT
SYR 20ML LL LF (SYRINGE) ×2 IMPLANT
SYR 5ML LL (SYRINGE) ×2 IMPLANT
SYR BULB 3OZ (MISCELLANEOUS) ×2 IMPLANT
SYR TB 1ML LUER SLIP (SYRINGE) ×2 IMPLANT
TOWEL GREEN STERILE FF (TOWEL DISPOSABLE) ×2 IMPLANT
TUBING HIGH PRESS EXTEN 6IN (TUBING) ×2 IMPLANT
WATER STERILE IRR 1000ML POUR (IV SOLUTION) ×2 IMPLANT

## 2019-06-28 NOTE — Interval H&P Note (Signed)
History and Physical Interval Note:  06/28/2019 10:15 AM  Carla May  has presented today for surgery, with the diagnosis of RETINAL DETACHMENT.  The various methods of treatment have been discussed with the patient and family. After consideration of risks, benefits and other options for treatment, the patient has consented to  Procedure(s): PARS PLANA VITRECTOMY WITH 25 GAUGE (Left) as a surgical intervention.  The patient's history has been reviewed, patient examined, no change in status, stable for surgery.  I have reviewed the patient's chart and labs.  Questions were answered to the patient's satisfaction.     Bernarda Caffey

## 2019-06-28 NOTE — Transfer of Care (Addendum)
Immediate Anesthesia Transfer of Care Note  Patient: Carla May  Procedure(s) Performed: PARS PLANA VITRECTOMY WITH 25 GAUGE (Left Eye) Silicon Oil Removal (Left Eye) Laser Photo Ablation (Left Eye) Air/Fluid Exchange (Left Eye)  Patient Location: PACU  Anesthesia Type:General  Level of Consciousness: awake, alert  and oriented  Airway & Oxygen Therapy: Patient Spontanous Breathing and Patient connected to face mask oxygen  Post-op Assessment: Report given to RN and Post -op Vital signs reviewed and stable  Post vital signs: Reviewed and stable  Last Vitals:  Vitals Value Taken Time  BP 155/71 06/28/19 1522  Temp 36.6 C 06/28/19 1522  Pulse 60 06/28/19 1528  Resp 16 06/28/19 1506  SpO2 95 % 06/28/19 1528  Vitals shown include unvalidated device data.  Last Pain:  Vitals:   06/28/19 1522  TempSrc:   PainSc: 0-No pain         Complications: No apparent anesthesia complications

## 2019-06-28 NOTE — Discharge Instructions (Addendum)
POSTOPERATIVE INSTRUCTIONS  Your doctor has performed vitreoretinal surgery on you at [facility] Sloan eye patched and shielded until seen by Dr. Coralyn Pear 930 AM tomorrow in clinic - Do not use drops until return - Keep head elevated while awake - Sleep with head elevated to 30 degrees or greater    - No strenuous bending, stooping or lifting.  - You may not drive until further notice.  - If your doctor used a gas bubble in your eye during the procedure he will advise you on postoperative positioning. If you have a gas bubble you will be wearing a green bracelet that was applied in the operating room. The green bracelet should stay on as long as the gas bubble is in your eye. While the gas bubble is present you should not fly in an airplane. If you require general anesthesia while the gas bubble is present you must notify your anesthesiologist that an intraocular gas bubble is present so he can take the appropriate precautions.  - Tylenol or any other over-the-counter pain reliever can be used according to your doctor. If more pain medicine is required, your doctor will have a prescription for you.  - You may read, go up and down stairs, and watch television.    Bernarda Caffey, M.D., Ph.D.

## 2019-06-28 NOTE — Progress Notes (Signed)
Per Dr. Linna Caprice no lab work needed.

## 2019-06-28 NOTE — Brief Op Note (Signed)
06/28/2019  2:51 PM  PATIENT:  Carla May  78 y.o. female  PRE-OPERATIVE DIAGNOSIS:  RETINAL DETACHMENT  POST-OPERATIVE DIAGNOSIS:  RETINAL DETACHMENT  PROCEDURE:  Procedure(s): PARS PLANA VITRECTOMY WITH 25 GAUGE (Left) Silicon Oil Removal (Left) Laser Photo Ablation (Left) Air/Fluid Exchange (Left)  SURGEON:  Surgeon(s) and Role:    Bernarda Caffey, MD - Primary  ASSISTANTS: Ernest Mallick, Ophthalmic Assistant  ANESTHESIA:   general  EBL:  5 mL   BLOOD ADMINISTERED:none  DRAINS: none   LOCAL MEDICATIONS USED:  NONE  SPECIMEN:  No Specimen  DISPOSITION OF SPECIMEN:  N/A  COUNTS:  YES  TOURNIQUET:  * No tourniquets in log *  DICTATION: .Note written in EPIC  PLAN OF CARE: Discharge to home after PACU  PATIENT DISPOSITION:  PACU - hemodynamically stable.   Delay start of Pharmacological VTE agent (>24hrs) due to surgical blood loss or risk of bleeding: not applicable

## 2019-06-28 NOTE — Anesthesia Procedure Notes (Addendum)
Procedure Name: Intubation Date/Time: 06/28/2019 1:21 PM Performed by: Imagene Riches, CRNA Pre-anesthesia Checklist: Patient identified, Emergency Drugs available, Suction available and Patient being monitored Patient Re-evaluated:Patient Re-evaluated prior to induction Oxygen Delivery Method: Circle System Utilized Preoxygenation: Pre-oxygenation with 100% oxygen Induction Type: IV induction Ventilation: Mask ventilation without difficulty Laryngoscope Size: Miller and 2 Grade View: Grade II Tube type: Oral Tube size: 7.0 mm Number of attempts: 1 Airway Equipment and Method: Stylet and Oral airway Placement Confirmation: ETT inserted through vocal cords under direct vision,  positive ETCO2 and breath sounds checked- equal and bilateral Secured at: 21 cm Tube secured with: Tape Dental Injury: Teeth and Oropharynx as per pre-operative assessment and Injury to lip

## 2019-06-28 NOTE — Op Note (Signed)
Date of Surgery: 08.13.2020  Pre-Op Diagnosis:  1. History of retinal detachment with silicon oil injection, left eye  Post-op Diagnosis:  2. same  Procedure:  1. 25 gauge Pars Plana Vitrectomy 2. Silicone Oil Removal  3. Endolaser Retinopexy  CPT Codes:  1. [id number] LT  Surgeon: Bernarda Caffey, MD,PhD  Assistant: Ernest Mallick, Ophthalmic Assistant  Anesthesia: General  Estimated Blood Loss <5 cc  Complications: None  Description of the Procedure:   Patient was seen in the pre-operative area. After all remaining questions were answered, the operative LEFT EYE was marked and the informed consent was confirmed.  The patient was brought back to the operating room by the nursing staff. An additional time-out was performed. All in attendance (the nursing staff, anesthesia staff, and ophthalmology staff) agreed upon the patient, type of surgery, and the location of surgery. The operative eye was prepped and draped in sterile ophthalmic fashion.   Valved trocars were placed at the 430, 130, and 1030 oclock meridians. The infusion line was brought to the operative field and found to be functional and free of air bubbles. The infusion line was inserted in the inferotemporal trocar, visualized with light pipe, and turned on. The infusion was secured with steristrips.   Next Silicone oil was removed with the extraction syringe. Multiple Air-fluid exchanges were performed with the vitrectomy cutter. Next using the wide-field viewing system, kenalog was injected into the vitreous and a limited posterior vitrectomy was performed. There was negligible remaining vitreous following her two prior vitrectomies. On meticulous inspection, the retina was good position with good laser in place. The peripheral retina was inspected and found to be free of retinal tears and breaks. Next using the lighted endolaser, 1 row of supplementary endolaser was placed along the posterior border of the existing laser scars. A  final partial fluid air exchange was performed to leave a ~30% air bubble in the vitreous cavity. The 3 trocars were removed and sutured closed with 7.0 vicryl suture.  Filtered room air was used to inflate the eye to physiologic pressure. Subtenon's antiobiotic and Kenalog were injected. The lid speculum was removed. Antibiotic ointment and post-operative drops were placed. The eye was doubly patched shut and a shield was taped on top. The patient tolerated the procedure well without any intraoperative or immediate postoperative complications. The patient was taken to the recovery room in good condition. The patient was instructed to maintain a strict face-down position and will be seen by Dr. Coralyn Pear tomorrow morning in clinic.

## 2019-06-28 NOTE — Anesthesia Preprocedure Evaluation (Signed)
Anesthesia Evaluation  Patient identified by MRN, date of birth, ID band Patient awake    Reviewed: Allergy & Precautions, NPO status , Patient's Chart, lab work & pertinent test results  Airway Mallampati: II  TM Distance: >3 FB Neck ROM: Full    Dental  (+) Teeth Intact, Dental Advisory Given   Pulmonary former smoker,    breath sounds clear to auscultation       Cardiovascular  Rhythm:Regular Rate:Normal     Neuro/Psych    GI/Hepatic   Endo/Other    Renal/GU      Musculoskeletal   Abdominal   Peds  Hematology   Anesthesia Other Findings   Reproductive/Obstetrics                             Anesthesia Physical Anesthesia Plan  ASA: II  Anesthesia Plan: General   Post-op Pain Management:    Induction:   PONV Risk Score and Plan: Ondansetron, Dexamethasone and Propofol infusion  Airway Management Planned: Oral ETT  Additional Equipment:   Intra-op Plan:   Post-operative Plan: Extubation in OR  Informed Consent: I have reviewed the patients History and Physical, chart, labs and discussed the procedure including the risks, benefits and alternatives for the proposed anesthesia with the patient or authorized representative who has indicated his/her understanding and acceptance.     Dental advisory given  Plan Discussed with: CRNA and Anesthesiologist  Anesthesia Plan Comments:         Anesthesia Quick Evaluation

## 2019-06-29 ENCOUNTER — Ambulatory Visit (INDEPENDENT_AMBULATORY_CARE_PROVIDER_SITE_OTHER): Payer: Medicare Other | Admitting: Ophthalmology

## 2019-06-29 ENCOUNTER — Encounter (INDEPENDENT_AMBULATORY_CARE_PROVIDER_SITE_OTHER): Payer: Self-pay | Admitting: Ophthalmology

## 2019-06-29 DIAGNOSIS — H3322 Serous retinal detachment, left eye: Secondary | ICD-10-CM

## 2019-06-29 DIAGNOSIS — H15092 Other scleritis, left eye: Secondary | ICD-10-CM

## 2019-06-29 DIAGNOSIS — H40052 Ocular hypertension, left eye: Secondary | ICD-10-CM

## 2019-06-29 DIAGNOSIS — H3342 Traction detachment of retina, left eye: Secondary | ICD-10-CM

## 2019-06-29 DIAGNOSIS — H35352 Cystoid macular degeneration, left eye: Secondary | ICD-10-CM

## 2019-06-29 DIAGNOSIS — Z961 Presence of intraocular lens: Secondary | ICD-10-CM

## 2019-06-29 DIAGNOSIS — H3581 Retinal edema: Secondary | ICD-10-CM

## 2019-06-29 DIAGNOSIS — Z8669 Personal history of other diseases of the nervous system and sense organs: Secondary | ICD-10-CM

## 2019-06-29 MED ORDER — PREDNISONE 20 MG PO TABS: 40.0000 mg | ORAL_TABLET | Freq: Every day | ORAL | 0 refills | Status: DC

## 2019-06-29 NOTE — Anesthesia Postprocedure Evaluation (Signed)
Anesthesia Post Note  Patient: Carla May  Procedure(s) Performed: PARS PLANA VITRECTOMY WITH 25 GAUGE (Left Eye) Silicon Oil Removal (Left Eye) Laser Photo Ablation (Left Eye) Air/Fluid Exchange (Left Eye)     Patient location during evaluation: PACU Anesthesia Type: General Level of consciousness: awake and alert Pain management: pain level controlled Vital Signs Assessment: post-procedure vital signs reviewed and stable Respiratory status: spontaneous breathing, nonlabored ventilation, respiratory function stable and patient connected to nasal cannula oxygen Cardiovascular status: blood pressure returned to baseline and stable Postop Assessment: no apparent nausea or vomiting Anesthetic complications: no    Last Vitals:  Vitals:   06/28/19 1506 06/28/19 1522  BP: (!) 162/72 (!) 155/71  Pulse: 64 (!) 58  Resp: 16   Temp:  36.6 C  SpO2: 93% 94%    Last Pain:  Vitals:   06/28/19 1522  TempSrc:   PainSc: 0-No pain                 Akiya Morr COKER

## 2019-07-04 NOTE — Progress Notes (Signed)
[facility] Note  07/06/2019     CHIEF COMPLAINT Patient presents for Post-op Follow-up   HISTORY OF PRESENT ILLNESS: Carla May is a 78 y.o. female who presents to the clinic today for:   HPI    Post-op Follow-up    Discomfort includes itching and floaters.  Negative for pain, foreign body sensation, tearing and discharge.  Vision is improved.  I, the attending physician,  performed the HPI with the patient and updated documentation appropriately.          Comments    PPV POV Silicone oil removal OS 06/28/19. Patient states the "bubble is not noticed now", she has some itching and occasional floaters OS,denies ocular pain. Pt is using PSO UNG, PF, Zymaxid QID OS and Cosopt Bid OS.        Last edited by Bernarda Caffey, MD on 07/08/2019  8:26 PM. (History)    Patient states she feels like her vision is getting better, she states the air bubble is already gone  Referring physician: Kelton Pillar, MD [address] [place],  [place] [address]  HISTORICAL INFORMATION:   Selected notes from the MEDICAL RECORD NUMBER Referred by Dr. Len Blalock for concern of retinal hole LEE: Mellody Memos) [BCVA: OD: OS:] Ocular Hx- PMH-anxiety, arthritis, HLD    CURRENT MEDICATIONS: Current Outpatient Medications (Ophthalmic Drugs)  Medication Sig  . carboxymethylcellulose (REFRESH PLUS) 0.5 % SOLN Place 1 drop into both eyes 3 (three) times daily as needed (for dry eyes).   . bacitracin-polymyxin b (POLYSPORIN) ophthalmic ointment Place 1 application into the left eye at bedtime as needed.  . dorzolamide-timolol (COSOPT) 22.3-6.8 MG/ML ophthalmic solution Place 1 drop into the left eye 2 (two) times daily. (Patient not taking: Reported on 07/06/2019)  . DUREZOL 0.05 % EMUL Place 1 drop into the left eye 3 (three) times daily. (Patient not taking: Reported on 06/29/2019)  . PROLENSA 0.07 % SOLN Place 1 drop into the left eye 2 (two) times a day.  (Patient not taking: Reported on 06/26/2019)   No current facility-administered medications for this visit.  (Ophthalmic Drugs)   Current Outpatient Medications (Other)  Medication Sig  . aspirin EC 81 MG tablet Take 81 mg by mouth daily.  Marland Kitchen ibuprofen (ADVIL) 800 MG tablet Take 800 mg by mouth every 8 (eight) hours as needed.  . Multiple Vitamin (MULTIVITAMIN WITH MINERALS) TABS tablet Take 1 tablet by mouth daily. One-A-Day for Women 50+  . nabumetone (RELAFEN) 500 MG tablet Take 750 mg by mouth 3 (three) times daily.  . Omega-3 Fatty Acids (FISH OIL ULTRA) 1400 MG CAPS Take 1,400 mg by mouth daily.  Marland Kitchen omeprazole (PRILOSEC OTC) 20 MG tablet Take 20 mg by mouth daily.  . predniSONE (DELTASONE) 20 MG tablet Take 2 tablets (40 mg total) by mouth daily.   Current Facility-Administered Medications (Other)  Medication Route  . triamcinolone acetonide (KENALOG-40) injection 4 mg Intravitreal      REVIEW OF SYSTEMS: ROS    Positive for: Eyes   Negative for: Constitutional, Gastrointestinal, Neurological, Skin, Genitourinary, Musculoskeletal, HENT, Endocrine, Cardiovascular, Respiratory, Psychiatric, Allergic/Imm, Heme/Lymph   Last edited by Zenovia Jordan, LPN on 5/36/4680  3:21 AM. (History)       ALLERGIES Allergies  Allergen Reactions  . Atorvastatin Other (See Comments)    Muscle aches/ memory loss  . Penicillins Itching, Nausea Only and Other (See Comments)    Did it involve swelling of the face/tongue/throat, SOB,  or low BP? Unknown Did it involve sudden or severe rash/hives, skin peeling, or any reaction on the inside of your mouth or nose? Itching Did you need to seek medical attention at a hospital or doctor's office? No When did it last happen? 1970s If all above answers are "NO", may proceed with cephalosporin use.   . Sulfa Antibiotics Nausea Only    PAST MEDICAL HISTORY Past Medical History:  Diagnosis Date  . Anxiety   . Arthritis    back. neck  . Cataracts,  bilateral    removed by surgery  . GERD (gastroesophageal reflux disease)   . History of palpitations   . HOH (hard of hearing) 06/28/2019  . Hyperlipidemia    diet controlled and fish oil  . MVP (mitral valve prolapse)    never has caused any problems per patient on 06/27/19  . PONV (postoperative nausea and vomiting)    No problems in 08/2018; however has had previous problems  . Pre-diabetes    diet controlled, no med  . Retinal detachment    OS  . SVD (spontaneous vaginal delivery)    x 2   Past Surgical History:  Procedure Laterality Date  . ABDOMINAL HYSTERECTOMY  1970's  . AIR/FLUID EXCHANGE Left 06/28/2019   Procedure: Air/Fluid Exchange;  Surgeon: Bernarda Caffey, MD;  Location: [facility];  Service: Ophthalmology;  Laterality: Left;  . CATARACT EXTRACTION    . COLONOSCOPY     polyp  . EYE SURGERY Bilateral   . GAS INSERTION Right 11/26/2014   Procedure: INSERTION OF GAS;  Surgeon: Hayden Pedro, MD;  Location: [facility];  Service: Ophthalmology;  Laterality: Right;  C3F8  . GAS/FLUID EXCHANGE Left 08/21/2018   Procedure: GAS/FLUID EXCHANGE;  Surgeon: Bernarda Caffey, MD;  Location: [facility];  Service: Ophthalmology;  Laterality: Left;  C3F8  . KNEE ARTHROSCOPY Left   . LASER PHOTO ABLATION Left 06/28/2019   Procedure: Laser Photo Ablation;  Surgeon: Bernarda Caffey, MD;  Location: [facility];  Service: Ophthalmology;  Laterality: Left;  . PARS PLANA VITRECTOMY Left 06/28/2019   Procedure: PARS PLANA VITRECTOMY WITH 25 GAUGE;  Surgeon: Bernarda Caffey, MD;  Location: [facility];  Service: Ophthalmology;  Laterality: Left;  . PHOTOCOAGULATION WITH LASER Right 11/26/2014   Procedure: PHOTOCOAGULATION WITH LASER;  Surgeon: Hayden Pedro, MD;  Location: [facility];  Service: Ophthalmology;  Laterality: Right;  . PHOTOCOAGULATION WITH LASER Left 08/21/2018   Procedure: PHOTOCOAGULATION WITH LASER;  Surgeon: Bernarda Caffey, MD;  Location: [facility];  Service: Ophthalmology;  Laterality: Left;  . REPAIR OF COMPLEX  TRACTION RETINAL DETACHMENT Left 12/06/2018   Procedure: REPAIR OF COMPLEX TRACTION RETINAL DETACHMENT, 25 gauge vitrectomy, endolaser photocoagulation, memebrane peel, perfluoron injection,  and silicone oil;  Surgeon: Bernarda Caffey, MD;  Location: [facility];  Service: Ophthalmology;  Laterality: Left;  . RETINAL DETACHMENT SURGERY Left 08/21/2018   SBP - Dr. Adonis Housekeeper  . SCLERAL BUCKLE Right 11/26/2014   Procedure: SCLERAL BUCKLE RIGHT EYE ;  Surgeon: Hayden Pedro, MD;  Location: [facility];  Service: Ophthalmology;  Laterality: Right;  . SCLERAL BUCKLE WITH POSSIBLE 25 GAUGE PARS PLANA VITRECTOMY Left 08/21/2018   Procedure: SCLERAL BUCKLE WITH 25 GAUGE PARS PLANA VITRECTOMY;  Surgeon: Bernarda Caffey, MD;  Location: [facility];  Service: Ophthalmology;  Laterality: Left;  . SILICON OIL REMOVAL Left 4/00/8676   Procedure: Silicon Oil Removal;  Surgeon: Bernarda Caffey, MD;  Location: [facility];  Service: Ophthalmology;  Laterality: Left;  . TONSILLECTOMY  FAMILY HISTORY Family History  Problem Relation Age of Onset  . Cancer Mother   . Heart failure Father     SOCIAL HISTORY Social History   Tobacco Use  . Smoking status: Former Smoker    Packs/day: 0.50    Years: 10.00    Pack years: 5.00    Types: Cigarettes    Quit date: 1970    Years since quitting: 50.6  . Smokeless tobacco: Never Used  . Tobacco comment: quit in 1970's  Substance Use Topics  . Alcohol use: No  . Drug use: No         OPHTHALMIC EXAM:  Base Eye Exam    Visual Acuity (Snellen - Linear)      Right Left   Dist cc 20/40 20/150   Dist ph cc 20/30 -2 20/80 +2   Correction: Glasses       Tonometry (Tonopen, 9:47 AM)      Right Left   Pressure 12 18       Pupils      Dark Light Shape React APD   Right 4 3 Round Brisk None   Left 5  Round  None       Visual Fields (Counting fingers)      Left Right   Restrictions Partial outer superior temporal, inferior temporal, superior nasal, inferior nasal  deficiencies        Extraocular Movement      Right Left    Full, Ortho Full, Ortho       Neuro/Psych    Oriented x3: Yes   Mood/Affect: Normal       Dilation    Left eye: 1.0% Mydriacyl, 2.5% Phenylephrine @ 9:47 AM        Slit Lamp and Fundus Exam    Slit Lamp Exam      Right Left   Lids/Lashes Mild Meibomian gland dysfunction, Telangiectasia, Ptosis Ptosis, Dermatochalasis - upper lid   Conjunctiva/Sclera post surgical changes 1+ Injection, nodular scleritis ST quad -- 2.5 mm vertically X 3.5 mm horizontal -- almost resolved, sutures intact   Cornea 1-2+ Punctate epithelial erosions, Temporal Well healed cataract wounds, Debris in tear film 2+ inferior Punctate epithelial erosions, mild Debris in tear film   Anterior Chamber Deep and quiet Deep, 2-3+ cell/pigment   Iris Round and dilated Round and dilated   Lens PC IOL in good position, trace PCO PC IOL in good position, trace Posterior capsular opacification   Vitreous Vitreous syneresis, mild pigment in anterior vitreous, Posterior vitreous detachment; scattered vitreous debris Post vitrectomy, clear, silicon oil removed, 2-4+MPNTIRW, 30-35% air bubble       Fundus Exam      Right Left   Disc  Sharp rim, mild tilt, temporal pallor   C/D Ratio 0.2 0.3   Macula  Flat, good foveal reflex, mild epiretinal membrane, mild Retinal pigment epithelial mottling   Vessels  Vascular attenuation   Periphery  Retina attached over buckle; good buckle height; good laser surrounding buckle w/ new row posterior to buckle; stably improved fibrosis; retinotomy from 0300-0430 with good laser surrounding          IMAGING AND PROCEDURES  Imaging and Procedures for '@TODAY' @  OCT, Retina - OU - Both Eyes       Right Eye Quality was good. Central Foveal Thickness: 275. Progression has been stable. Findings include normal foveal contour, no IRF, no SRF, epiretinal membrane (stable).   Left Eye Quality was good. Central Foveal Thickness:  241.  Progression has improved. Findings include epiretinal membrane, normal foveal contour, no SRF, no IRF.   Notes *Images captured and stored on drive  Diagnosis / Impression:  OD: mild ERM; NFP; no IRF/SRF-stable from prior OS: retina reattached; no IRF/SRF; tr ERM  Clinical management:  See below  Abbreviations: NFP - Normal foveal profile. CME - cystoid macular edema. PED - pigment epithelial detachment. IRF - intraretinal fluid. SRF - subretinal fluid. EZ - ellipsoid zone. ERM - epiretinal membrane. ORA - outer retinal atrophy. ORT - outer retinal tubulation. SRHM - subretinal hyper-reflective material                 ASSESSMENT/PLAN:    ICD-10-CM   1. Left retinal detachment  H33.22   2. 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 Retinal edema  H35.81 OCT, Retina - OU - Both Eyes  6. CME (cystoid macular edema), left  H35.352   7. History of retinal detachment  Z86.69   8. Pseudophakia of both eyes  Z96.1     1-3. Retinal detachment, OS - originally: macula-sparing inferior retinal detachment from 3-6 oclock - large HST at 0500 and 2 small tears at 300 within the detached retina - s/pSBP + PPV/PFC/EL/FAX/14% C3F8 OS, 10.07.19 - progressive fibrosis/PVR just posterior to buckle around 0400 with +SRF tracking posterior to buckle -- focal tractional/PVR detachment OS - s/p WHQ/PR/FFM/[id number] silicon oil OS, 0.30.0923  - now POW1 s/p 30Q PPV with silicon oil removal (76.22.63)  - retina attachedand in good position -- good buckle height and laser over buckle and around retinotomy - IOP okay at 18 today  - air bubble gone             - start  PF taper -- 4,3,2,1 drops daily, decrease weekly                         zymaxid QID OS -- until Monday                         Cosopt dec to QD  daily OS                         PSO ung PRN and bedtime OS             - eye shield when sleeping             - post op drop and positioning instructions reviewed             - tylenol/ibuprofen for pain             - Rx given for breakthrough pain  - f/u 3 week  4. Nodular scleritis OS - sup temporal quadrant  - pain/discomfort symptoms stably improved and nodular inflammation continues to improve on po prednisone -- currently at 40 mg  - pt reports minimal side effects on po steroid and is tolerating well             - lab work to rule out rheumatologic cause--all results negative   CBC, CMP   HIV   HLA Panel   ANA   ANCA   ACE, Lysozyme   RF   ESR, CRP  - continues to improve on PO prednisone - stop Durezol BID OS /  stopped Prolensa BID OS (started by JDM) - d/c Ibuprofen 800 mg TID  - continue po prednisone 58m for 1 wk then begin po pred taper: 30, 20, 10 mg decreasing weekly - pt already on prilosec for GI prophylaxis - continue to monitor  5,6. CME OS -- currently resolved  - mild post-op CME  - initially treated w/ PF and ketorolac -- had interval increase in CME  - s/p sub-tenons kenalog (03.13.20)  7. History of retinal detachment OD  - S/P SBP OD 11/26/14 -- Dr. JTempie Hoist - looks great -- retina in great position  - monitor  8. Pseudophakia OU  - s/p CE/IOL OU ([facility])  - doing well  - monitor   Ophthalmic Meds Ordered this visit:  Meds ordered this encounter  Medications  . bacitracin-polymyxin b (POLYSPORIN) ophthalmic ointment    Sig: Place 1 application into the left eye at bedtime as needed.    Dispense:  3.5 g    Refill:  2       Return in about 3 weeks (around 07/27/2019) for POV.  There are no Patient Instructions on file for this visit.   Explained the diagnoses, plan, and follow up with the patient and they expressed understanding.  Patient expressed understanding of  the importance of proper follow up care.   This document serves as a record of services personally performed by BGardiner Sleeper MD, PhD. It was created on their behalf by DRoselee Nova COMT. The creation of this record is the provider's dictation and/or activities during the visit.  Electronically signed by: DRoselee Nova COMT 07/08/19 8:28 PM  BGardiner Sleeper M.D., Ph.D. Diseases & Surgery of the Retina and Vitreous [facility] I have reviewed the above documentation for accuracy and completeness, and I agree with the above. BGardiner Sleeper M.D., Ph.D. 07/08/19 8:31 PM    Abbreviations: M myopia (nearsighted); A astigmatism; H hyperopia (farsighted); P presbyopia; Mrx spectacle prescription;  CTL contact lenses; OD right eye; OS left eye; OU both eyes  XT exotropia; ET esotropia; PEK punctate epithelial keratitis; PEE punctate epithelial erosions; DES dry eye syndrome; MGD meibomian gland dysfunction; ATs artificial tears; PFAT's preservative free artificial tears; NAnnettanuclear sclerotic cataract; PSC posterior subcapsular cataract; ERM epi-retinal membrane; PVD posterior vitreous detachment; RD retinal detachment; DM diabetes mellitus; DR diabetic retinopathy; NPDR non-proliferative diabetic retinopathy; PDR proliferative diabetic retinopathy; CSME clinically significant macular edema; DME diabetic macular edema; dbh dot blot hemorrhages; CWS cotton wool spot; POAG primary open angle glaucoma; C/D cup-to-disc ratio; HVF humphrey visual field; GVF goldmann visual field; OCT optical coherence tomography; IOP intraocular pressure; BRVO Branch retinal vein occlusion; CRVO central retinal vein occlusion; CRAO central retinal artery occlusion; BRAO branch retinal artery occlusion; RT retinal tear; SB scleral buckle; PPV pars plana vitrectomy; VH Vitreous hemorrhage; PRP panretinal laser photocoagulation; IVK intravitreal kenalog; VMT vitreomacular traction; MH Macular hole;  NVD  neovascularization of the disc; NVE neovascularization elsewhere; AREDS age related eye disease study; ARMD age related macular degeneration; POAG primary open angle glaucoma; EBMD epithelial/anterior basement membrane dystrophy; ACIOL anterior chamber intraocular lens; IOL intraocular lens; PCIOL posterior chamber intraocular lens; Phaco/IOL phacoemulsification with intraocular lens placement; PBolckowphotorefractive keratectomy; LASIK laser assisted in situ keratomileusis; HTN hypertension; DM diabetes mellitus; COPD chronic obstructive pulmonary disease

## 2019-07-06 ENCOUNTER — Encounter (INDEPENDENT_AMBULATORY_CARE_PROVIDER_SITE_OTHER): Payer: Self-pay | Admitting: Ophthalmology

## 2019-07-06 ENCOUNTER — Other Ambulatory Visit: Payer: Self-pay

## 2019-07-06 ENCOUNTER — Ambulatory Visit (INDEPENDENT_AMBULATORY_CARE_PROVIDER_SITE_OTHER): Payer: Medicare Other | Admitting: Ophthalmology

## 2019-07-06 DIAGNOSIS — H3581 Retinal edema: Secondary | ICD-10-CM | POA: Diagnosis not present

## 2019-07-06 DIAGNOSIS — H40052 Ocular hypertension, left eye: Secondary | ICD-10-CM

## 2019-07-06 DIAGNOSIS — H3322 Serous retinal detachment, left eye: Secondary | ICD-10-CM

## 2019-07-06 DIAGNOSIS — H35352 Cystoid macular degeneration, left eye: Secondary | ICD-10-CM

## 2019-07-06 DIAGNOSIS — Z8669 Personal history of other diseases of the nervous system and sense organs: Secondary | ICD-10-CM

## 2019-07-06 DIAGNOSIS — Z961 Presence of intraocular lens: Secondary | ICD-10-CM

## 2019-07-06 DIAGNOSIS — H15092 Other scleritis, left eye: Secondary | ICD-10-CM

## 2019-07-06 DIAGNOSIS — H3342 Traction detachment of retina, left eye: Secondary | ICD-10-CM

## 2019-07-06 MED ORDER — BACITRACIN-POLYMYXIN B 500-10000 UNIT/GM OP OINT: 1.0000 "application " | TOPICAL_OINTMENT | Freq: Every evening | OPHTHALMIC | 2 refills | Status: DC | PRN

## 2019-07-08 ENCOUNTER — Encounter (INDEPENDENT_AMBULATORY_CARE_PROVIDER_SITE_OTHER): Payer: Self-pay | Admitting: Ophthalmology

## 2019-07-20 ENCOUNTER — Other Ambulatory Visit (INDEPENDENT_AMBULATORY_CARE_PROVIDER_SITE_OTHER): Payer: Self-pay | Admitting: Ophthalmology

## 2019-07-26 NOTE — Progress Notes (Addendum)
[facility] Note  07/27/2019     CHIEF COMPLAINT Patient presents for Retina Follow Up   HISTORY OF PRESENT ILLNESS: Carla May is a 78 y.o. female who presents to the clinic today for:   HPI    Retina Follow Up    Patient presents with  Other.  In left eye.  This started 3 weeks ago.  Severity is moderate.  I, the attending physician,  performed the HPI with the patient and updated documentation appropriately.          Comments    Patient here for 3 weeks RD OS/Silicone oil removal (s/p PPV + Silicone Oil removal (4.26). Patient states vision OU not so good today. Some days better than others. Yesterday was clearer than today-swollen or something. No eye pain.        Last edited by Bernarda Caffey, MD on 07/27/2019  1:41 PM. (History)    Patient states she feels like the vision is down in both eyes today, she states the right eye feels foggy, she states she still sees a spot in her vision in her left eye that she saw when she had the air bubble, pt states she is taking 20 mg po steroid, states today should have been 10 mg this morning, she states she is still using PF TID and Cosopt qdaily  Referring physician: Kelton Pillar, MD [address],  [place] [address]  HISTORICAL INFORMATION:   Selected notes from the MEDICAL RECORD NUMBER Referred by Dr. Len Blalock for concern of retinal hole LEE: Mellody Memos) [BCVA: OD: OS:] Ocular Hx- PMH-anxiety, arthritis, HLD    CURRENT MEDICATIONS: Current Outpatient Medications (Ophthalmic Drugs)  Medication Sig  . bacitracin-polymyxin b (POLYSPORIN) ophthalmic ointment Place 1 application into the left eye at bedtime as needed.  . carboxymethylcellulose (REFRESH PLUS) 0.5 % SOLN Place 1 drop into both eyes 3 (three) times daily as needed (for dry eyes).   . dorzolamide-timolol (COSOPT) 22.3-6.8 MG/ML ophthalmic solution Place 1 drop into the left eye 2 (two) times daily. (Patient  not taking: Reported on 07/06/2019)  . DUREZOL 0.05 % EMUL Place 1 drop into the left eye 3 (three) times daily. (Patient not taking: Reported on 06/29/2019)  . PROLENSA 0.07 % SOLN Place 1 drop into the left eye 2 (two) times a day. (Patient not taking: Reported on 06/26/2019)   No current facility-administered medications for this visit.  (Ophthalmic Drugs)   Current Outpatient Medications (Other)  Medication Sig  . aspirin EC 81 MG tablet Take 81 mg by mouth daily.  Marland Kitchen ibuprofen (ADVIL) 800 MG tablet Take 800 mg by mouth every 8 (eight) hours as needed.  . Multiple Vitamin (MULTIVITAMIN WITH MINERALS) TABS tablet Take 1 tablet by mouth daily. One-A-Day for Women 50+  . nabumetone (RELAFEN) 500 MG tablet Take 750 mg by mouth 3 (three) times daily.  . Omega-3 Fatty Acids (FISH OIL ULTRA) 1400 MG CAPS Take 1,400 mg by mouth daily.  Marland Kitchen omeprazole (PRILOSEC OTC) 20 MG tablet Take 20 mg by mouth daily.  . predniSONE (DELTASONE) 20 MG tablet Take 2 tablets (40 mg total) by mouth daily.   Current Facility-Administered Medications (Other)  Medication Route  . triamcinolone acetonide (KENALOG-40) injection 4 mg Intravitreal      REVIEW OF SYSTEMS: ROS    Positive for: Gastrointestinal, Musculoskeletal, Eyes   Negative for: Constitutional, Neurological, Skin, Genitourinary, HENT, Endocrine, Cardiovascular, Respiratory, Psychiatric, Allergic/Imm, Heme/Lymph  Last edited by Theodore Demark, COA on 07/27/2019  1:20 PM. (History)       ALLERGIES Allergies  Allergen Reactions  . Atorvastatin Other (See Comments)    Muscle aches/ memory loss  . Penicillins Itching, Nausea Only and Other (See Comments)    Did it involve swelling of the face/tongue/throat, SOB, or low BP? Unknown Did it involve sudden or severe rash/hives, skin peeling, or any reaction on the inside of your mouth or nose? Itching Did you need to seek medical attention at a hospital or doctor's office? No When did it last  happen? 1970s If all above answers are "NO", may proceed with cephalosporin use.   . Sulfa Antibiotics Nausea Only    PAST MEDICAL HISTORY Past Medical History:  Diagnosis Date  . Anxiety   . Arthritis    back. neck  . Cataracts, bilateral    removed by surgery  . GERD (gastroesophageal reflux disease)   . History of palpitations   . HOH (hard of hearing) 06/28/2019  . Hyperlipidemia    diet controlled and fish oil  . MVP (mitral valve prolapse)    never has caused any problems per patient on 06/27/19  . PONV (postoperative nausea and vomiting)    No problems in 08/2018; however has had previous problems  . Pre-diabetes    diet controlled, no med  . Retinal detachment    OS  . SVD (spontaneous vaginal delivery)    x 2   Past Surgical History:  Procedure Laterality Date  . ABDOMINAL HYSTERECTOMY  1970's  . AIR/FLUID EXCHANGE Left 06/28/2019   Procedure: Air/Fluid Exchange;  Surgeon: Bernarda Caffey, MD;  Location: [facility];  Service: Ophthalmology;  Laterality: Left;  . CATARACT EXTRACTION    . COLONOSCOPY     polyp  . EYE SURGERY Bilateral   . GAS INSERTION Right 11/26/2014   Procedure: INSERTION OF GAS;  Surgeon: Hayden Pedro, MD;  Location: [facility];  Service: Ophthalmology;  Laterality: Right;  C3F8  . GAS/FLUID EXCHANGE Left 08/21/2018   Procedure: GAS/FLUID EXCHANGE;  Surgeon: Bernarda Caffey, MD;  Location: [facility];  Service: Ophthalmology;  Laterality: Left;  C3F8  . KNEE ARTHROSCOPY Left   . LASER PHOTO ABLATION Left 06/28/2019   Procedure: Laser Photo Ablation;  Surgeon: Bernarda Caffey, MD;  Location: [facility];  Service: Ophthalmology;  Laterality: Left;  . PARS PLANA VITRECTOMY Left 06/28/2019   Procedure: PARS PLANA VITRECTOMY WITH 25 GAUGE;  Surgeon: Bernarda Caffey, MD;  Location: [facility];  Service: Ophthalmology;  Laterality: Left;  . PHOTOCOAGULATION WITH LASER Right 11/26/2014   Procedure: PHOTOCOAGULATION WITH LASER;  Surgeon: Hayden Pedro, MD;  Location: [facility];  Service:  Ophthalmology;  Laterality: Right;  . PHOTOCOAGULATION WITH LASER Left 08/21/2018   Procedure: PHOTOCOAGULATION WITH LASER;  Surgeon: Bernarda Caffey, MD;  Location: [facility];  Service: Ophthalmology;  Laterality: Left;  . REPAIR OF COMPLEX TRACTION RETINAL DETACHMENT Left 12/06/2018   Procedure: REPAIR OF COMPLEX TRACTION RETINAL DETACHMENT, 25 gauge vitrectomy, endolaser photocoagulation, memebrane peel, perfluoron injection,  and silicone oil;  Surgeon: Bernarda Caffey, MD;  Location: [facility];  Service: Ophthalmology;  Laterality: Left;  . RETINAL DETACHMENT SURGERY Left 08/21/2018   SBP - Dr. Adonis Housekeeper  . SCLERAL BUCKLE Right 11/26/2014   Procedure: SCLERAL BUCKLE RIGHT EYE ;  Surgeon: Hayden Pedro, MD;  Location: [facility];  Service: Ophthalmology;  Laterality: Right;  . SCLERAL BUCKLE WITH POSSIBLE 25 GAUGE PARS PLANA VITRECTOMY Left 08/21/2018  Procedure: SCLERAL BUCKLE WITH 25 GAUGE PARS PLANA VITRECTOMY;  Surgeon: Bernarda Caffey, MD;  Location: [facility];  Service: Ophthalmology;  Laterality: Left;  . SILICON OIL REMOVAL Left 2/56/3893   Procedure: Silicon Oil Removal;  Surgeon: Bernarda Caffey, MD;  Location: [facility];  Service: Ophthalmology;  Laterality: Left;  . TONSILLECTOMY      FAMILY HISTORY Family History  Problem Relation Age of Onset  . Cancer Mother   . Heart failure Father     SOCIAL HISTORY Social History   Tobacco Use  . Smoking status: Former Smoker    Packs/day: 0.50    Years: 10.00    Pack years: 5.00    Types: Cigarettes    Quit date: 1970    Years since quitting: 50.7  . Smokeless tobacco: Never Used  . Tobacco comment: quit in 1970's  Substance Use Topics  . Alcohol use: No  . Drug use: No         OPHTHALMIC EXAM:  Base Eye Exam    Visual Acuity (Snellen - Linear)      Right Left   Dist cc 20/40 20/100   Dist ph cc NI 20/80 -1   Correction: Glasses       Tonometry (Tonopen, 1:14 PM)      Right Left   Pressure 16 12       Pupils      Dark Light  Shape React APD   Right 4 3 Round Brisk None   Left 5 5 Round  None       Visual Fields (Counting fingers)      Left Right    Full    Restrictions  Partial outer superior temporal, inferior temporal, superior nasal, inferior nasal deficiencies       Extraocular Movement      Right Left    Full, Ortho Full, Ortho       Neuro/Psych    Oriented x3: Yes   Mood/Affect: Normal       Dilation    Both eyes: 1.0% Mydriacyl, 2.5% Phenylephrine @ 1:14 PM        Slit Lamp and Fundus Exam    Slit Lamp Exam      Right Left   Lids/Lashes Mild Meibomian gland dysfunction, Telangiectasia, Ptosis Ptosis, Dermatochalasis - upper lid, mild UL Telangiectasia   Conjunctiva/Sclera post surgical changes White and quiet, sutures intact   Cornea 1-2+ Punctate epithelial erosions, Temporal Well healed cataract wounds, Debris in tear film 1+ inferior Punctate epithelial erosions   Anterior Chamber Deep and quiet Deep and quiet   Iris Round and dilated Round and dilated   Lens PC IOL in good position, trace PCO PC IOL in good position, trace Posterior capsular opacification   Vitreous Vitreous syneresis, mild pigment in anterior vitreous, Posterior vitreous detachment; scattered vitreous debris Post vitrectomy, clear, silicon oil removed, 7-3+SKAJGOT, air bubble gone       Fundus Exam      Right Left   Disc Pink and Sharp Sharp rim, mild tilt, trace temporal pallor, temporal Peripapillary atrophy   C/D Ratio 0.2 0.3   Macula Blunted foveal reflex, Retinal pigment epithelial mottling, ERM, No heme or edema Flat, good foveal reflex, mild epiretinal membrane, mild Retinal pigment epithelial mottling   Vessels Vascular attenuation Vascular attenuation   Periphery Attached over scleral buckle, good buckle height, good scarring over buckle Retina attached over buckle; good buckle height; good laser surrounding buckle w/ new row posterior to buckle; +fibrosis at 0300  over buckle; retinotomy from 0300-0430  with good laser surrounding        Refraction    Wearing Rx      Sphere Cylinder Axis Add   Right -1.00 Sphere  +2.50   Left [place] +1.00 025 +2.50          IMAGING AND PROCEDURES  Imaging and Procedures for '@TODAY'$ @  OCT, Retina - OU - Both Eyes       Right Eye Quality was good. Central Foveal Thickness: 282. Progression has been stable. Findings include normal foveal contour, no IRF, no SRF, epiretinal membrane (stable).   Left Eye Quality was good. Central Foveal Thickness: 255. Progression has been stable. Findings include epiretinal membrane, normal foveal contour, no SRF, no IRF.   Notes *Images captured and stored on drive  Diagnosis / Impression:  OD: mild ERM; NFP; no IRF/SRF-stable from prior OS: retina remains attached; NFP; no IRF/SRF; tr ERM  Clinical management:  See below  Abbreviations: NFP - Normal foveal profile. CME - cystoid macular edema. PED - pigment epithelial detachment. IRF - intraretinal fluid. SRF - subretinal fluid. EZ - ellipsoid zone. ERM - epiretinal membrane. ORA - outer retinal atrophy. ORT - outer retinal tubulation. SRHM - subretinal hyper-reflective material                 ASSESSMENT/PLAN:    ICD-10-CM   1. Left retinal detachment  H33.22   2. 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 Retinal edema  H35.81 OCT, Retina - OU - Both Eyes  6. CME (cystoid macular edema), left  H35.352   7. History of retinal detachment  Z86.69   8. Pseudophakia of both eyes  Z96.1     1-3. Retinal detachment, OS - originally: macula-sparing inferior retinal detachment from 3-6 oclock - large HST at 0500 and 2 small tears at 300 within the detached retina - s/pSBP + PPV/PFC/EL/FAX/14% C3F8 OS, 10.07.19 - progressive fibrosis/PVR just posterior to buckle around 0400 with +SRF tracking posterior  to buckle -- focal tractional/PVR detachment OS - s/p YQM/VH/QIO/[id number]/KG/4010UV silicon oil OS, 2.53.6644  - now s/p 03K PPV with silicon oil removal (74.25.95)  - retina attachedand in good position -- good buckle height and laser over buckle and around retinotomy - IOP okay at 12 today             - continue PF taper -- 2,1 drops daily, decrease weekly                         continue Cosopt QD daily OS -- okay to stop                         PSO ung PRN and bedtime OS             - post op drop and positioning instructions reviewed             - tylenol/ibuprofen for pain             - Rx given for breakthrough pain  - f/u 4 weeks  4. Nodular scleritis OS  - essentially resolved today - sup temporal quadrant  - pain/discomfort symptoms stably improved and nodular inflammation continues to improve on po prednisone -- currently at 10 mg  - pt reports minimal side  effects on po steroid and is tolerating well             - lab work to rule out rheumatologic cause--all results negative   CBC, CMP   HIV   HLA Panel   ANA   ANCA   ACE, Lysozyme   RF   ESR, CRP  - continues to improve on PO prednisone - stop Durezol BID OS / stopped Prolensa BID OS (started by JDM) - d/c Ibuprofen 800 mg TID  - decrease po prednisone '10mg'$  for 5 days, then stop - pt already on prilosec for GI prophylaxis - continue to monitor  5,6. CME OS -- currently resolved  - mild post-op CME  - initially treated w/ PF and ketorolac -- had interval increase in CME  - s/p sub-tenons kenalog (03.13.20)  7. History of retinal detachment OD  - S/P SBP OD 11/26/14 -- Dr. Tempie Hoist  - looks great -- retina in great position  - monitor  8. Pseudophakia OU  - s/p CE/IOL OU ([place])  - doing well  - monitor   Ophthalmic Meds Ordered this visit:  No orders of the defined types were placed in this encounter.       Return in about 4 weeks (around 68/04/1682) for f/u s/p silicone oil removal OS, DFE, OCT.  There are no Patient Instructions on file for this visit.   Explained the diagnoses, plan, and follow up with the patient and they expressed understanding.  Patient expressed understanding of the importance of proper follow up care.   This document serves as a record of services personally performed by Gardiner Sleeper, MD, PhD. It was created on their behalf by Ernest Mallick, OA, an ophthalmic assistant. The creation of this record is the provider's dictation and/or activities during the visit.    Electronically signed by: Ernest Mallick, OA 09.10.2020 12:53 AM   Gardiner Sleeper, M.D., Ph.D. Diseases & Surgery of the Retina and Vitreous [facility]  I have reviewed the above documentation for accuracy and completeness, and I agree with the above. Gardiner Sleeper, M.D., Ph.D. 07/28/19 12:53 AM     Abbreviations: M myopia (nearsighted); A astigmatism; H hyperopia (farsighted); P presbyopia; Mrx spectacle prescription;  CTL contact lenses; OD right eye; OS left eye; OU both eyes  XT exotropia; ET esotropia; PEK punctate epithelial keratitis; PEE punctate epithelial erosions; DES dry eye syndrome; MGD meibomian gland dysfunction; ATs artificial tears; PFAT's preservative free artificial tears; [place] nuclear sclerotic cataract; PSC posterior subcapsular cataract; ERM epi-retinal membrane; PVD posterior vitreous detachment; RD retinal detachment; DM diabetes mellitus; DR diabetic retinopathy; NPDR non-proliferative diabetic retinopathy; PDR proliferative diabetic retinopathy; CSME clinically significant macular edema; DME diabetic macular edema; dbh dot blot hemorrhages; CWS cotton wool spot; POAG primary open angle glaucoma; C/D cup-to-disc ratio; HVF humphrey visual field; GVF goldmann visual field; OCT optical coherence tomography; IOP intraocular pressure; BRVO Branch retinal vein  occlusion; CRVO central retinal vein occlusion; CRAO central retinal artery occlusion; BRAO branch retinal artery occlusion; RT retinal tear; SB scleral buckle; PPV pars plana vitrectomy; VH Vitreous hemorrhage; PRP panretinal laser photocoagulation; IVK intravitreal kenalog; VMT vitreomacular traction; MH Macular hole;  NVD neovascularization of the disc; NVE neovascularization elsewhere; AREDS age related eye disease study; ARMD age related macular degeneration; POAG primary open angle glaucoma; EBMD epithelial/anterior basement membrane dystrophy; ACIOL anterior chamber intraocular lens; IOL intraocular lens; PCIOL posterior chamber intraocular lens; Phaco/IOL phacoemulsification with intraocular lens placement; [place] photorefractive keratectomy;  LASIK laser assisted in situ keratomileusis; HTN hypertension; DM diabetes mellitus; COPD chronic obstructive pulmonary disease    

## 2019-07-27 ENCOUNTER — Ambulatory Visit (INDEPENDENT_AMBULATORY_CARE_PROVIDER_SITE_OTHER): Payer: Medicare Other | Admitting: Ophthalmology

## 2019-07-27 ENCOUNTER — Encounter (INDEPENDENT_AMBULATORY_CARE_PROVIDER_SITE_OTHER): Payer: Self-pay | Admitting: Ophthalmology

## 2019-07-27 ENCOUNTER — Other Ambulatory Visit: Payer: Self-pay

## 2019-07-27 DIAGNOSIS — H3581 Retinal edema: Secondary | ICD-10-CM

## 2019-07-27 DIAGNOSIS — H3322 Serous retinal detachment, left eye: Secondary | ICD-10-CM

## 2019-07-27 DIAGNOSIS — H15092 Other scleritis, left eye: Secondary | ICD-10-CM

## 2019-07-27 DIAGNOSIS — H35352 Cystoid macular degeneration, left eye: Secondary | ICD-10-CM

## 2019-07-27 DIAGNOSIS — H40052 Ocular hypertension, left eye: Secondary | ICD-10-CM

## 2019-07-27 DIAGNOSIS — Z8669 Personal history of other diseases of the nervous system and sense organs: Secondary | ICD-10-CM

## 2019-07-27 DIAGNOSIS — H3342 Traction detachment of retina, left eye: Secondary | ICD-10-CM

## 2019-07-27 DIAGNOSIS — Z961 Presence of intraocular lens: Secondary | ICD-10-CM

## 2019-07-28 ENCOUNTER — Encounter (INDEPENDENT_AMBULATORY_CARE_PROVIDER_SITE_OTHER): Payer: Self-pay | Admitting: Ophthalmology

## 2019-08-13 NOTE — Progress Notes (Signed)
[facility] Note  08/14/2019     CHIEF COMPLAINT Patient presents for Eye Problem   HISTORY OF PRESENT ILLNESS: Carla May is a 78 y.o. female who presents to the clinic today for:   HPI    Patient arrived for retina eval/ eye pain and redness, patient reports pain started Saturday (08/11/19),she took tylenol 650 mg x 2 Q 6-8 Hrs , with relief. Patient rates pain 5 this am , she tylenol _0 .    Last edited by Zenovia Jordan, LPN on 2/44/0102  7:25 AM. 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 Len Blalock for concern of retinal hole LEE: Mellody Memos) [BCVA: OD: OS:] Ocular Hx- PMH-anxiety, arthritis, HLD    CURRENT MEDICATIONS: Current Outpatient Medications (Ophthalmic Drugs)  Medication Sig  . bacitracin-polymyxin b (POLYSPORIN) ophthalmic ointment Place 1 application into the left eye at bedtime as needed.  . carboxymethylcellulose (REFRESH PLUS) 0.5 % SOLN Place 1 drop into both eyes 3 (three) times daily as needed (for dry eyes).   . dorzolamide-timolol (COSOPT) 22.3-6.8 MG/ML ophthalmic solution Place 1 drop into the left eye 2 (two) times daily.  . DUREZOL 0.05 % EMUL Place 1 drop into the left eye 3 (three) times daily.  Marland Kitchen PROLENSA 0.07 % SOLN Place 1 drop into the left eye 2 (two) times a day.   No current facility-administered medications for this visit.  (Ophthalmic Drugs)   Current Outpatient Medications (Other)  Medication Sig  . aspirin EC 81 MG tablet Take 81 mg by mouth daily.  Marland Kitchen ibuprofen  (ADVIL) 800 MG tablet Take 800 mg by mouth every 8 (eight) hours as needed.  . Multiple Vitamin (MULTIVITAMIN WITH MINERALS) TABS tablet Take 1 tablet by mouth daily. One-A-Day for Women 50+  . nabumetone (RELAFEN) 500 MG tablet Take 750 mg by mouth 3 (three) times daily.  . Omega-3 Fatty Acids (FISH OIL ULTRA) 1400 MG CAPS Take 1,400 mg by mouth daily.  Marland Kitchen omeprazole (PRILOSEC OTC) 20 MG tablet Take 20 mg by mouth daily.  . predniSONE (DELTASONE) 20 MG tablet Take 2 tablets (40 mg total) by mouth daily.   Current Facility-Administered Medications (Other)  Medication Route  . triamcinolone acetonide (KENALOG-40) injection 4 mg Intravitreal      REVIEW OF SYSTEMS: ROS    Positive for: Eyes   Negative for: Constitutional, Gastrointestinal, Neurological, Skin, Genitourinary, Musculoskeletal, HENT, Endocrine, Cardiovascular, Respiratory, Psychiatric, Allergic/Imm, Heme/Lymph   Last edited by Zenovia Jordan, LPN on 0/34/7425  9:56 AM. (History)       ALLERGIES Allergies  Allergen Reactions  . Atorvastatin Other (See Comments)    Muscle aches/ memory loss  . Penicillins Itching, Nausea Only and Other (See Comments)    Did it involve swelling of the face/tongue/throat, SOB, or low BP? Unknown Did it involve sudden or severe rash/hives, skin peeling, or any reaction on the inside of your mouth or nose? Itching Did you need to seek medical attention at a hospital or doctor's office? No When did it last  happen? 1970s If all above answers are "NO", may proceed with cephalosporin use.   . Sulfa Antibiotics Nausea Only    PAST MEDICAL HISTORY Past Medical History:  Diagnosis Date  . Anxiety   . Arthritis    back. neck  . Cataracts, bilateral    removed by surgery  . GERD (gastroesophageal reflux disease)   . History of palpitations   . HOH (hard of hearing) 06/28/2019  . Hyperlipidemia    diet controlled and fish oil  . MVP (mitral valve prolapse)    never has caused any  problems per patient on 06/27/19  . PONV (postoperative nausea and vomiting)    No problems in 08/2018; however has had previous problems  . Pre-diabetes    diet controlled, no med  . Retinal detachment    OS  . SVD (spontaneous vaginal delivery)    x 2   Past Surgical History:  Procedure Laterality Date  . ABDOMINAL HYSTERECTOMY  1970's  . AIR/FLUID EXCHANGE Left 06/28/2019   Procedure: Air/Fluid Exchange;  Surgeon: Bernarda Caffey, MD;  Location: [facility];  Service: Ophthalmology;  Laterality: Left;  . CATARACT EXTRACTION    . COLONOSCOPY     polyp  . EYE SURGERY Bilateral   . GAS INSERTION Right 11/26/2014   Procedure: INSERTION OF GAS;  Surgeon: Hayden Pedro, MD;  Location: [facility];  Service: Ophthalmology;  Laterality: Right;  C3F8  . GAS/FLUID EXCHANGE Left 08/21/2018   Procedure: GAS/FLUID EXCHANGE;  Surgeon: Bernarda Caffey, MD;  Location: [facility];  Service: Ophthalmology;  Laterality: Left;  C3F8  . KNEE ARTHROSCOPY Left   . LASER PHOTO ABLATION Left 06/28/2019   Procedure: Laser Photo Ablation;  Surgeon: Bernarda Caffey, MD;  Location: [facility];  Service: Ophthalmology;  Laterality: Left;  . PARS PLANA VITRECTOMY Left 06/28/2019   Procedure: PARS PLANA VITRECTOMY WITH 25 GAUGE;  Surgeon: Bernarda Caffey, MD;  Location: [facility];  Service: Ophthalmology;  Laterality: Left;  . PHOTOCOAGULATION WITH LASER Right 11/26/2014   Procedure: PHOTOCOAGULATION WITH LASER;  Surgeon: Hayden Pedro, MD;  Location: [facility];  Service: Ophthalmology;  Laterality: Right;  . PHOTOCOAGULATION WITH LASER Left 08/21/2018   Procedure: PHOTOCOAGULATION WITH LASER;  Surgeon: Bernarda Caffey, MD;  Location: [facility];  Service: Ophthalmology;  Laterality: Left;  . REPAIR OF COMPLEX TRACTION RETINAL DETACHMENT Left 12/06/2018   Procedure: REPAIR OF COMPLEX TRACTION RETINAL DETACHMENT, 25 gauge vitrectomy, endolaser photocoagulation, memebrane peel, perfluoron injection,  and silicone oil;  Surgeon: Bernarda Caffey, MD;  Location: [facility];  Service: Ophthalmology;  Laterality: Left;  . RETINAL DETACHMENT SURGERY Left 08/21/2018   SBP - Dr. Adonis Housekeeper  . SCLERAL BUCKLE Right 11/26/2014   Procedure: SCLERAL BUCKLE RIGHT EYE ;  Surgeon: Hayden Pedro, MD;  Location: [facility];  Service: Ophthalmology;  Laterality: Right;  . SCLERAL BUCKLE WITH POSSIBLE 25 GAUGE PARS PLANA VITRECTOMY Left 08/21/2018   Procedure: SCLERAL BUCKLE WITH 25 GAUGE PARS PLANA VITRECTOMY;  Surgeon: Bernarda Caffey, MD;  Location: [facility];  Service: Ophthalmology;  Laterality: Left;  . SILICON OIL REMOVAL Left 9/89/2119   Procedure: Silicon Oil Removal;  Surgeon: Bernarda Caffey, MD;  Location: [facility];  Service: Ophthalmology;  Laterality: Left;  . TONSILLECTOMY      FAMILY HISTORY Family History  Problem Relation Age of Onset  . Cancer Mother   . Heart failure Father     SOCIAL HISTORY Social History   Tobacco Use  . Smoking status: Former Smoker  Packs/day: 0.50    Years: 10.00    Pack years: 5.00    Types: Cigarettes    Quit date: 1970    Years since quitting: 50.7  . Smokeless tobacco: Never Used  . Tobacco comment: quit in 1970's  Substance Use Topics  . Alcohol use: No  . Drug use: No         OPHTHALMIC EXAM:  Base Eye Exam    Visual Acuity (Snellen - Linear)      Right Left   Dist cc 20/40 20/150   Dist ph cc NI NI   Correction: Glasses       Tonometry (Tonopen, 8:55 AM)      Right Left   Pressure 12 20       Pupils      Dark Light Shape React APD   Right 3 2 Round Brisk None   Left 3 2 Round Slow None       Visual Fields (Counting fingers)      Left Right     Full   Restrictions Partial outer superior temporal, inferior temporal, superior nasal deficiencies        Extraocular Movement      Right Left    Full, Ortho Full, Ortho       Neuro/Psych    Oriented x3: Yes   Mood/Affect: Normal       Dilation    Both eyes: 1.0% Mydriacyl, 2.5% Phenylephrine @ 8:56 AM        Slit Lamp and Fundus Exam     Slit Lamp Exam      Right Left   Lids/Lashes Mild Meibomian gland dysfunction, Telangiectasia, Ptosis Ptosis, Dermatochalasis - upper lid, mild UL Telangiectasia   Conjunctiva/Sclera post surgical changes 2-3+ injection, sutures intact, recurrent nodular scleritis 7 mm round nodule ST quadrant   Cornea 1-2+ Punctate epithelial erosions, Temporal Well healed cataract wounds, Debris in tear film 1+ inferior Punctate epithelial erosions   Anterior Chamber Deep and quiet Deep, 2-3+cell pigment   Iris Round and dilated Round and dilated   Lens PC IOL in good position, trace PCO PC IOL in good position, trace Posterior capsular opacification   Vitreous Vitreous syneresis, mild pigment in anterior vitreous, Posterior vitreous detachment; scattered vitreous debris Post vitrectomy, clear, silicon oil removed, 7-8+EUMPNTI, air bubble gone       Fundus Exam      Right Left   Disc Pink and Sharp Sharp rim, mild tilt, trace temporal pallor, temporal Peripapillary atrophy   C/D Ratio 0.2 0.3   Macula Blunted foveal reflex, Retinal pigment epithelial mottling, ERM, No heme or edema Flat, good foveal reflex, mild epiretinal membrane, mild Retinal pigment epithelial mottling   Vessels Vascular attenuation Vascular attenuation   Periphery Attached over scleral buckle, good buckle height, good scarring over buckle Retina attached over buckle; good buckle height; good laser surrounding buckle w/ new row posterior to buckle; +fibrosis at 0300 over buckle; retinotomy from 0300-0430 with good laser surrounding          IMAGING AND PROCEDURES  Imaging and Procedures for _0 @  OCT, Retina - OU - Both Eyes       Right Eye Quality was good. Central Foveal Thickness: 285. Progression has been stable. Findings include normal foveal contour, no IRF, no SRF, epiretinal membrane (stable).   Left Eye Quality was good. Central Foveal Thickness: 267. Progression has been stable. Findings include epiretinal  membrane, normal foveal contour, no SRF, no IRF.   Notes *  Images captured and stored on drive  Diagnosis / Impression:  OD: mild ERM; NFP; no IRF/SRF-stable from prior OS: retina remains attached; NFP; no IRF/SRF; tr ERM  Clinical management:  See below  Abbreviations: NFP - Normal foveal profile. CME - cystoid macular edema. PED - pigment epithelial detachment. IRF - intraretinal fluid. SRF - subretinal fluid. EZ - ellipsoid zone. ERM - epiretinal membrane. ORA - outer retinal atrophy. ORT - outer retinal tubulation. SRHM - subretinal hyper-reflective material                 ASSESSMENT/PLAN:    ICD-10-CM   1. Left retinal detachment  H33.22   2. 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 Retinal edema  H35.81 OCT, Retina - OU - Both Eyes  6. CME (cystoid macular edema), left  H35.352   7. History of retinal detachment  Z86.69   8. Pseudophakia of both eyes  Z96.1     1-3. Retinal detachment, OS - originally: macula-sparing inferior retinal detachment from 3-6 oclock - large HST at 0500 and 2 small tears at 300 within the detached retina - s/pSBP + PPV/PFC/EL/FAX/14% C3F8 OS, 10.07.19 - progressive fibrosis/PVR just posterior to buckle around 0400 with +SRF tracking posterior to buckle -- focal tractional/PVR detachment OS - s/p QPY/PP/JKD/[id number] silicon oil OS, 0.53.9767  - now s/p 34L PPV with silicon oil removal (93.79.02)  - retina attachedand in good position -- good buckle height and laser over buckle and around retinotomy - IOP okay at 20 today             - post op drop and positioning instructions reviewed             - tylenol/ibuprofen for pain  - f/u 4 weeks  4. Nodular scleritis OS  - pt presents for recurrence today - sup temporal quadrant -- 7x7 mm nodule  with surrounding inflammtion  - pain/discomfort symptoms and nodular inflammation have returned following the tapering of meds             - lab work to rule out rheumatologic cause--all results negative   CBC, CMP   HIV   HLA Panel   ANA   ANCA   ACE, Lysozyme   RF   ESR, CRP - restart Durezol and Prolensa QID OS  - restart po prednisone 74m until appt next week - pt already on prilosec for GI prophylaxis - next Friday, October 9  5,6. CME OS -- currently resolved  - mild post-op CME  - initially treated w/ PF and ketorolac -- had interval increase in CME  - s/p sub-tenons kenalog (03.13.20)  7. History of retinal detachment OD  - S/P SBP OD 11/26/14 -- Dr. JTempie Hoist - looks great -- retina in great position  - monitor  8. Pseudophakia OU  - s/p CE/IOL OU ([place])  - doing well  - monitor   Ophthalmic Meds Ordered this visit:  No orders of the defined types were placed in this encounter.      Return for as scheduled.  There are no Patient Instructions on file for this visit.   Explained the diagnoses, plan, and follow up with the patient and they expressed understanding.  Patient expressed understanding of the importance of proper follow up care.   This document serves as a record of services personally performed by  Gardiner Sleeper, MD, PhD. It was created on their behalf by Ernest Mallick, OA, an ophthalmic assistant. The creation of this record is the provider's dictation and/or activities during the visit.    Electronically signed by: Ernest Mallick, OA  09.28.2020 11:45 PM    Gardiner Sleeper, M.D., Ph.D. Diseases & Surgery of the Retina and Vitreous [facility]   I have reviewed the above documentation for accuracy and completeness, and I agree with the above. Gardiner Sleeper, M.D., Ph.D. 08/15/19 11:48 PM     Abbreviations: M myopia (nearsighted); A astigmatism; H hyperopia (farsighted); P  presbyopia; Mrx spectacle prescription;  CTL contact lenses; OD right eye; OS left eye; OU both eyes  XT exotropia; ET esotropia; PEK punctate epithelial keratitis; PEE punctate epithelial erosions; DES dry eye syndrome; MGD meibomian gland dysfunction; ATs artificial tears; PFAT's preservative free artificial tears; [place] nuclear sclerotic cataract; PSC posterior subcapsular cataract; ERM epi-retinal membrane; PVD posterior vitreous detachment; RD retinal detachment; DM diabetes mellitus; DR diabetic retinopathy; NPDR non-proliferative diabetic retinopathy; PDR proliferative diabetic retinopathy; CSME clinically significant macular edema; DME diabetic macular edema; dbh dot blot hemorrhages; CWS cotton wool spot; POAG primary open angle glaucoma; C/D cup-to-disc ratio; HVF humphrey visual field; GVF goldmann visual field; OCT optical coherence tomography; IOP intraocular pressure; BRVO Branch retinal vein occlusion; CRVO central retinal vein occlusion; CRAO central retinal artery occlusion; BRAO branch retinal artery occlusion; RT retinal tear; SB scleral buckle; PPV pars plana vitrectomy; VH Vitreous hemorrhage; PRP panretinal laser photocoagulation; IVK intravitreal kenalog; VMT vitreomacular traction; MH Macular hole;  NVD neovascularization of the disc; NVE neovascularization elsewhere; AREDS age related eye disease study; ARMD age related macular degeneration; POAG primary open angle glaucoma; EBMD epithelial/anterior basement membrane dystrophy; ACIOL anterior chamber intraocular lens; IOL intraocular lens; PCIOL posterior chamber intraocular lens; Phaco/IOL phacoemulsification with intraocular lens placement; [place] photorefractive keratectomy; LASIK laser assisted in situ keratomileusis; HTN hypertension; DM diabetes mellitus; COPD chronic obstructive pulmonary disease

## 2019-08-14 ENCOUNTER — Ambulatory Visit (INDEPENDENT_AMBULATORY_CARE_PROVIDER_SITE_OTHER): Payer: Medicare Other | Admitting: Ophthalmology

## 2019-08-14 ENCOUNTER — Encounter (INDEPENDENT_AMBULATORY_CARE_PROVIDER_SITE_OTHER): Payer: Self-pay | Admitting: Ophthalmology

## 2019-08-14 ENCOUNTER — Other Ambulatory Visit: Payer: Self-pay

## 2019-08-14 DIAGNOSIS — H3322 Serous retinal detachment, left eye: Secondary | ICD-10-CM | POA: Diagnosis not present

## 2019-08-14 DIAGNOSIS — H3581 Retinal edema: Secondary | ICD-10-CM

## 2019-08-14 DIAGNOSIS — H15092 Other scleritis, left eye: Secondary | ICD-10-CM

## 2019-08-14 DIAGNOSIS — Z961 Presence of intraocular lens: Secondary | ICD-10-CM

## 2019-08-14 DIAGNOSIS — H35352 Cystoid macular degeneration, left eye: Secondary | ICD-10-CM

## 2019-08-14 DIAGNOSIS — Z8669 Personal history of other diseases of the nervous system and sense organs: Secondary | ICD-10-CM

## 2019-08-14 DIAGNOSIS — H3342 Traction detachment of retina, left eye: Secondary | ICD-10-CM

## 2019-08-14 DIAGNOSIS — H40052 Ocular hypertension, left eye: Secondary | ICD-10-CM

## 2019-08-16 MED ORDER — PREDNISONE 20 MG PO TABS: 60.0000 mg | ORAL_TABLET | Freq: Every day | ORAL | 0 refills | Status: DC

## 2019-08-16 NOTE — Addendum Note (Signed)
Addended by: Gardiner Sleeper on: 08/16/2019 09:01 AM   Modules accepted: Orders

## 2019-08-23 NOTE — Progress Notes (Signed)
[facility]  08/24/2019     CHIEF COMPLAINT Patient presents for Retina Follow Up   HISTORY OF PRESENT ILLNESS: Carla May is a 78 y.o. female who presents to the clinic today for:   HPI    Retina Follow Up    Patient presents with  Other.  In left eye.  This started 1 week ago.  Severity is moderate.  Duration of 1 week.  Since onset it is stable.  May, the attending physician,  performed the HPI with the patient and updated documentation appropriately.          Comments    78 y/o female pt here for 1 wk f/u for nodular scleritis OS.  Pt also s/p PPV w/silicon oil removal OS 8.13.20.  No change in [place] OU.  Denies pain, flashes, floaters, irritation.  Nodule OS seems a bit smaller.  Durezol and Prolensa QID OS.  Prednisone 35m PO QD.       Last edited by ZBernarda Caffey MD on 08/25/2019  9:49 AM. (History)    Patient states she thinks the nodule is shrinking, she states her she cant tell a difference in her vision, she states she has started the medication again  Referring physician: JGwendalyn Ege MRafael HernandezWMontgomeryGSavannah  [place] [id number] HISTORICAL INFORMATION:   Selected notes from the [place]. MLen Blalockfor concern of retinal hole LEE: (Mellody Memos [BCVA: OD: OS:] Ocular Hx- PMH-anxiety, arthritis, HLD    CURRENT MEDICATIONS: Current Outpatient Medications (Ophthalmic Drugs)  Medication Sig  . bacitracin-polymyxin b (POLYSPORIN) ophthalmic ointment Place 1 application into the left eye at bedtime as needed.  . carboxymethylcellulose (REFRESH PLUS) 0.5 % SOLN Place 1 drop into both eyes 3 (three) times daily as needed (for dry eyes).   . dorzolamide-timolol (COSOPT) 22.3-6.8 MG/ML ophthalmic solution Place 1 drop into the left eye 2 (two) times daily.  . DUREZOL 0.05 % EMUL Place 1 drop into the left eye 3 (three) times daily.  .Marland KitchenPROLENSA 0.07 % SOLN Place 1 drop into the left eye 2 (two)  times a day.   No current facility-administered medications for this visit.  (Ophthalmic Drugs)   Current Outpatient Medications (Other)  Medication Sig  . aspirin EC 81 MG tablet Take 81 mg by mouth daily.  .Marland Kitchenibuprofen (ADVIL) 800 MG tablet Take 800 mg by mouth every 8 (eight) hours as needed.  . Multiple Vitamin (MULTIVITAMIN WITH MINERALS) TABS tablet Take 1 tablet by mouth daily. One-A-Day for Women 50+  . nabumetone (RELAFEN) 500 MG tablet Take 750 mg by mouth 3 (three) times daily.  . Omega-3 Fatty Acids (FISH OIL ULTRA) 1400 MG CAPS Take 1,400 mg by mouth daily.  .Marland Kitchenomeprazole (PRILOSEC OTC) 20 MG tablet Take 20 mg by mouth daily.  . predniSONE (DELTASONE) 20 MG tablet Take 3 tablets (60 mg total) by mouth daily.   Current Facility-Administered Medications (Other)  Medication Route  . triamcinolone acetonide (KENALOG-40) injection 4 mg Intravitreal      REVIEW OF SYSTEMS: ROS    Positive for: Gastrointestinal, Musculoskeletal, Cardiovascular, Eyes   Negative for: Constitutional, Neurological, Skin, Genitourinary, HENT, Endocrine, Respiratory, Psychiatric, Allergic/Imm, Heme/Lymph   Last edited by BMatthew Folks COA on 08/24/2019  2:46 PM. (History)       ALLERGIES Allergies  Allergen Reactions  . Atorvastatin Other (See Comments)    Muscle aches/ memory loss  . Penicillins Itching,  Nausea Only and Other (See Comments)    Did it involve swelling of the face/tongue/throat, SOB, or low BP? Unknown Did it involve sudden or severe rash/hives, skin peeling, or any reaction on the inside of your mouth or nose? Itching Did you need to seek medical attention at a hospital or doctor's office? No When did it last happen? 1970s If all above answers are "NO", may proceed with cephalosporin use.   . Sulfa Antibiotics Nausea Only    PAST MEDICAL HISTORY Past Medical History:  Diagnosis Date  . Anxiety   . Arthritis    back. neck  . GERD (gastroesophageal reflux disease)    . History of palpitations   . HOH (hard of hearing) 06/28/2019  . Hyperlipidemia    diet controlled and fish oil  . MVP (mitral valve prolapse)    never has caused any problems per patient on 06/27/19  . PONV (postoperative nausea and vomiting)    No problems in 08/2018; however has had previous problems  . Pre-diabetes    diet controlled, no med  . Retinal detachment    OU  . SVD (spontaneous vaginal delivery)    x 2   Past Surgical History:  Procedure Laterality Date  . ABDOMINAL HYSTERECTOMY  1970's  . AIR/FLUID EXCHANGE Left 06/28/2019   Procedure: Air/Fluid Exchange;  Surgeon: Bernarda Caffey, MD;  Location: [facility];  Service: Ophthalmology;  Laterality: Left;  . CATARACT EXTRACTION Bilateral    Dr. Talbert Forest  . COLONOSCOPY     polyp  . EYE SURGERY Bilateral    Cat Sx and RD repair  . GAS INSERTION Right 11/26/2014   Procedure: INSERTION OF GAS;  Surgeon: Hayden Pedro, MD;  Location: [facility];  Service: Ophthalmology;  Laterality: Right;  C3F8  . GAS/FLUID EXCHANGE Left 08/21/2018   Procedure: GAS/FLUID EXCHANGE;  Surgeon: Bernarda Caffey, MD;  Location: [facility];  Service: Ophthalmology;  Laterality: Left;  C3F8  . KNEE ARTHROSCOPY Left   . LASER PHOTO ABLATION Left 06/28/2019   Procedure: Laser Photo Ablation;  Surgeon: Bernarda Caffey, MD;  Location: [facility];  Service: Ophthalmology;  Laterality: Left;  . PARS PLANA VITRECTOMY Left 06/28/2019   Procedure: PARS PLANA VITRECTOMY WITH 25 GAUGE;  Surgeon: Bernarda Caffey, MD;  Location: [facility];  Service: Ophthalmology;  Laterality: Left;  . PHOTOCOAGULATION WITH LASER Right 11/26/2014   Procedure: PHOTOCOAGULATION WITH LASER;  Surgeon: Hayden Pedro, MD;  Location: [facility];  Service: Ophthalmology;  Laterality: Right;  . PHOTOCOAGULATION WITH LASER Left 08/21/2018   Procedure: PHOTOCOAGULATION WITH LASER;  Surgeon: Bernarda Caffey, MD;  Location: [facility];  Service: Ophthalmology;  Laterality: Left;  . REPAIR OF COMPLEX TRACTION RETINAL DETACHMENT Left  12/06/2018   Procedure: REPAIR OF COMPLEX TRACTION RETINAL DETACHMENT, 25 gauge vitrectomy, endolaser photocoagulation, memebrane peel, perfluoron injection,  and silicone oil;  Surgeon: Bernarda Caffey, MD;  Location: [facility];  Service: Ophthalmology;  Laterality: Left;  . RETINAL DETACHMENT SURGERY Bilateral    SBP OD - Dr. Tempie Hoist (1.12.16).  SBP - Dr. Bernarda Caffey (10.7.19)  . SCLERAL BUCKLE Right 11/26/2014   Procedure: SCLERAL BUCKLE RIGHT EYE ;  Surgeon: Hayden Pedro, MD;  Location: [facility];  Service: Ophthalmology;  Laterality: Right;  . SCLERAL BUCKLE WITH POSSIBLE 25 GAUGE PARS PLANA VITRECTOMY Left 08/21/2018   Procedure: SCLERAL BUCKLE WITH 25 GAUGE PARS PLANA VITRECTOMY;  Surgeon: Bernarda Caffey, MD;  Location: [facility];  Service: Ophthalmology;  Laterality: Left;  . SILICON OIL REMOVAL Left  4/49/6759   Procedure: Silicon Oil Removal;  Surgeon: Bernarda Caffey, MD;  Location: [facility];  Service: Ophthalmology;  Laterality: Left;  . TONSILLECTOMY      FAMILY HISTORY Family History  Problem Relation Age of Onset  . Cancer Mother   . Heart failure Father     SOCIAL HISTORY Social History   Tobacco Use  . Smoking status: Former Smoker    Packs/day: 0.50    Years: 10.00    Pack years: 5.00    Types: Cigarettes    Quit date: 1970    Years since quitting: 50.8  . Smokeless tobacco: Never Used  . Tobacco comment: quit in 1970's  Substance Use Topics  . Alcohol use: No  . Drug use: No         OPHTHALMIC EXAM:  Base Eye Exam    Visual Acuity (Snellen - Linear)      Right Left   Dist cc 20/30 -2 20/200   Dist ph cc 20/30 + 20/70 -   Correction: Glasses       Tonometry (Tonopen, 2:53 PM)      Right Left   Pressure 11 18       Pupils      Dark Light Shape React APD   Right 3 2 Round Brisk None   Left 4 3 Round Slow None       Visual Fields (Counting fingers)      Left Right     Full   Restrictions Partial outer superior temporal, superior nasal deficiencies         Extraocular Movement      Right Left    Full, Ortho Full, Ortho       Neuro/Psych    Oriented x3: Yes   Mood/Affect: Normal       Dilation    Both eyes: 1.0% Mydriacyl, 2.5% Phenylephrine @ 2:53 PM        Slit Lamp and Fundus Exam    Slit Lamp Exam      Right Left   Lids/Lashes Mild Meibomian gland dysfunction, Telangiectasia, Ptosis Ptosis, Dermatochalasis - upper lid, mild UL Telangiectasia   Conjunctiva/Sclera White and quiet Injection improved; focal nodular scleritis improved--4 mm round nodule ST quadrant - trace injection overlying   Cornea 1-2+ Punctate epithelial erosions, Temporal Well healed cataract wounds, Debris in tear film 1+ inferior Punctate epithelial erosions   Anterior Chamber Deep and quiet Deep, 2-3+cell pigment   Iris Round and dilated Round and dilated   Lens PC IOL in good position, trace PCO PC IOL in good position, trace Posterior capsular opacification   Vitreous Vitreous syneresis, mild pigment in anterior vitreous, Posterior vitreous detachment; scattered vitreous debris Post vitrectomy, clear, 1-2+pigment       Fundus Exam      Right Left   Disc Pink and Sharp Sharp rim, mild tilt, trace temporal pallor, temporal Peripapillary atrophy   C/D Ratio 0.2 0.3   Macula Blunted foveal reflex, Retinal pigment epithelial mottling, ERM, No heme or edema Flat, good foveal reflex, mild epiretinal membrane, mild Retinal pigment epithelial mottling   Vessels Vascular attenuation Vascular attenuation   Periphery Attached over scleral buckle, good buckle height, good scarring over buckle Retina attached over buckle; good buckle height; good laser surrounding buckle w/ new row posterior to buckle; +fibrosis at 0300 over buckle; retinotomy from 0300-0430 with good laser surrounding          IMAGING AND PROCEDURES  Imaging and Procedures for '@TODAY' @  OCT, Retina - OU - Both Eyes       Right Eye Quality was good. Central Foveal Thickness: 277.  Progression has been stable. Findings include normal foveal contour, no IRF, no SRF, epiretinal membrane (stable).   Left Eye Quality was good. Central Foveal Thickness: 264. Progression has been stable. Findings include epiretinal membrane, normal foveal contour, no SRF, no IRF.   Notes *Images captured and stored on drive  Diagnosis / Impression:  OD: mild ERM; NFP; no IRF/SRF-stable from prior OS: retina remains attached; NFP; no IRF/SRF; tr ERM  Clinical management:  See below  Abbreviations: NFP - Normal foveal profile. CME - cystoid macular edema. PED - pigment epithelial detachment. IRF - intraretinal fluid. SRF - subretinal fluid. EZ - ellipsoid zone. ERM - epiretinal membrane. ORA - outer retinal atrophy. ORT - outer retinal tubulation. SRHM - subretinal hyper-reflective material                 ASSESSMENT/PLAN:    ICD-10-CM   1. Left retinal detachment  H33.22   2. 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 Retinal edema  H35.81 OCT, Retina - OU - Both Eyes  6. CME (cystoid macular edema), left  H35.352   7. History of retinal detachment  Z86.69   8. Pseudophakia of both eyes  Z96.1     1-3. Retinal detachment, OS - originally: macula-sparing inferior retinal detachment from 3-6 oclock - large HST at 0500 and 2 small tears at 300 within the detached retina - s/pSBP + PPV/PFC/EL/FAX/14% C3F8 OS, 10.07.19 - progressive fibrosis/PVR just posterior to buckle around 0400 with +SRF tracking posterior to buckle -- focal tractional/PVR detachment OS - s/p BWI/OM/BTD/[id number]/MI/[id number] silicon oil OS, 2.24.8250  - now s/p 03B PPV with silicon oil removal (04.88.89)  - retina attachedand in good position -- good buckle height and laser over buckle and around retinotomy - IOP okay at 18 today              - post op drop and positioning instructions reviewed             - tylenol/ibuprofen for pain  - f/u 4 weeks  4. Nodular scleritis OS  - recurrent episode following the tapering of meds (PO pred and topical PF/Prolensa) - sup temporal quadrant -- imrproved 4x4 mm nodule with surrounding inflammtion             - lab work to rule out rheumatologic cause--all results negative   CBC, CMP   HIV   HLA Panel   ANA   ANCA   ACE, Lysozyme   RF   ESR, CRP - cont Durezol and Prolensa QID OS  - cont po prednisone 38m until next appt - pt already on prilosec for GI prophylaxis - f/u October 19  5,6. CME OS -- currently resolved  - mild post-op CME  - initially treated w/ PF and ketorolac -- had interval increase in CME  - s/p sub-tenons kenalog (03.13.20)  7. History of retinal detachment OD  - S/P SBP OD 11/26/14 -- Dr. JTempie Hoist - looks great -- retina in great position  - monitor  8. Pseudophakia OU  - s/p CE/IOL OU ([place])  - doing well  - monitor   Ophthalmic Meds Ordered this visit:  No orders of the defined types were placed in this encounter.  Return in about 10 days (around 09/03/2019) for f/u nodular scleritis , DFE, OCT.  There are no Patient Instructions on file for this visit.   Explained the diagnoses, plan, and follow up with the patient and they expressed understanding.  Patient expressed understanding of the importance of proper follow up care.   This document serves as a record of services personally performed by Gardiner Sleeper, MD, PhD. It was created on their behalf by Ernest Mallick, OA, an ophthalmic assistant. The creation of this record is the provider's dictation and/or activities during the visit.    Electronically signed by: Ernest Mallick, OA 10.08.2020 9:53 AM   Gardiner Sleeper, M.D., Ph.D. Diseases & Surgery of the Retina and Vitreous [facility]  May have  reviewed the above documentation for accuracy and completeness, and May agree with the above. Gardiner Sleeper, M.D., Ph.D. 08/25/19 9:55 AM    Abbreviations: M myopia (nearsighted); A astigmatism; H hyperopia (farsighted); P presbyopia; Mrx spectacle prescription;  CTL contact lenses; OD right eye; OS left eye; OU both eyes  XT exotropia; ET esotropia; PEK punctate epithelial keratitis; PEE punctate epithelial erosions; DES dry eye syndrome; MGD meibomian gland dysfunction; ATs artificial tears; PFAT's preservative free artificial tears; [place] nuclear sclerotic cataract; PSC posterior subcapsular cataract; ERM epi-retinal membrane; PVD posterior vitreous detachment; RD retinal detachment; DM diabetes mellitus; DR diabetic retinopathy; NPDR non-proliferative diabetic retinopathy; PDR proliferative diabetic retinopathy; CSME clinically significant macular edema; DME diabetic macular edema; dbh dot blot hemorrhages; CWS cotton wool spot; POAG primary open angle glaucoma; C/D cup-to-disc ratio; HVF humphrey visual field; GVF goldmann visual field; OCT optical coherence tomography; IOP intraocular pressure; BRVO Branch retinal vein occlusion; CRVO central retinal vein occlusion; CRAO central retinal artery occlusion; BRAO branch retinal artery occlusion; RT retinal tear; SB scleral buckle; PPV pars plana vitrectomy; VH Vitreous hemorrhage; PRP panretinal laser photocoagulation; IVK intravitreal kenalog; VMT vitreomacular traction; MH Macular hole;  NVD neovascularization of the disc; NVE neovascularization elsewhere; AREDS age related eye disease study; ARMD age related macular degeneration; POAG primary open angle glaucoma; EBMD epithelial/anterior basement membrane dystrophy; ACIOL anterior chamber intraocular lens; IOL intraocular lens; PCIOL posterior chamber intraocular lens; Phaco/IOL phacoemulsification with intraocular lens placement; [place] photorefractive keratectomy; LASIK laser assisted in situ keratomileusis;  HTN hypertension; DM diabetes mellitus; COPD chronic obstructive pulmonary disease

## 2019-08-24 ENCOUNTER — Encounter (INDEPENDENT_AMBULATORY_CARE_PROVIDER_SITE_OTHER): Payer: Self-pay | Admitting: Ophthalmology

## 2019-08-24 ENCOUNTER — Ambulatory Visit (INDEPENDENT_AMBULATORY_CARE_PROVIDER_SITE_OTHER): Payer: Medicare Other | Admitting: Ophthalmology

## 2019-08-24 ENCOUNTER — Other Ambulatory Visit: Payer: Self-pay

## 2019-08-24 DIAGNOSIS — H3581 Retinal edema: Secondary | ICD-10-CM

## 2019-08-24 DIAGNOSIS — H40052 Ocular hypertension, left eye: Secondary | ICD-10-CM

## 2019-08-24 DIAGNOSIS — H3322 Serous retinal detachment, left eye: Secondary | ICD-10-CM

## 2019-08-24 DIAGNOSIS — Z8669 Personal history of other diseases of the nervous system and sense organs: Secondary | ICD-10-CM

## 2019-08-24 DIAGNOSIS — H3342 Traction detachment of retina, left eye: Secondary | ICD-10-CM

## 2019-08-24 DIAGNOSIS — H15092 Other scleritis, left eye: Secondary | ICD-10-CM

## 2019-08-24 DIAGNOSIS — Z961 Presence of intraocular lens: Secondary | ICD-10-CM

## 2019-08-24 DIAGNOSIS — H35352 Cystoid macular degeneration, left eye: Secondary | ICD-10-CM

## 2019-08-25 ENCOUNTER — Encounter (INDEPENDENT_AMBULATORY_CARE_PROVIDER_SITE_OTHER): Payer: Self-pay | Admitting: Ophthalmology

## 2019-08-29 NOTE — Progress Notes (Signed)
[facility] Note  09/03/2019     CHIEF COMPLAINT Patient presents for Retina Follow Up   HISTORY OF PRESENT ILLNESS: Carla May is a 78 y.o. female who presents to the clinic today for:   HPI    Retina Follow Up    Patient presents with  Other.  This started 10 days ago.  Severity is moderate.  I, the attending physician,  performed the HPI with the patient and updated documentation appropriately.          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 Patient states vision doing pretty good. About the same, not where wants to be but can see. No eye pain.        Last edited by Bernarda Caffey, MD on 09/03/2019  1:07 PM. (History)    Patient feels like the nodule is still shrinking, she is still taking the po steroid and using Prolensa and Durezol   Referring physician: Gwendalyn Ege, [address] [place],  [place] [address]  HISTORICAL INFORMATION:   Selected notes from the MEDICAL RECORD NUMBER Referred by Dr. Len Blalock for concern of retinal hole LEE: Mellody Memos) [BCVA: OD: OS:] Ocular Hx- PMH-anxiety, arthritis, HLD    CURRENT MEDICATIONS: Current Outpatient Medications (Ophthalmic Drugs)  Medication Sig  . bacitracin-polymyxin b (POLYSPORIN) ophthalmic ointment Place 1 application into the left eye at bedtime as needed.  . carboxymethylcellulose (REFRESH PLUS) 0.5 % SOLN Place 1 drop into both eyes 3 (three) times daily as needed (for dry eyes).   . dorzolamide-timolol (COSOPT) 22.3-6.8 MG/ML ophthalmic solution Place 1 drop into the left eye 2 (two) times daily.  . DUREZOL 0.05 % EMUL Place 1 drop into the left eye 3 (three) times daily.  Marland Kitchen PROLENSA 0.07 % SOLN Place 1 drop into the left eye 2 (two) times a day.   No current facility-administered medications for this visit.  (Ophthalmic Drugs)   Current Outpatient Medications (Other)  Medication Sig  . aspirin EC 81 MG tablet Take  81 mg by mouth daily.  Marland Kitchen ibuprofen (ADVIL) 800 MG tablet Take 800 mg by mouth every 8 (eight) hours as needed.  . Multiple Vitamin (MULTIVITAMIN WITH MINERALS) TABS tablet Take 1 tablet by mouth daily. One-A-Day for Women 50+  . nabumetone (RELAFEN) 500 MG tablet Take 750 mg by mouth 3 (three) times daily.  . Omega-3 Fatty Acids (FISH OIL ULTRA) 1400 MG CAPS Take 1,400 mg by mouth daily.  Marland Kitchen omeprazole (PRILOSEC OTC) 20 MG tablet Take 20 mg by mouth daily.  . predniSONE (DELTASONE) 20 MG tablet Take 3 tablets (60 mg total) by mouth daily.   Current Facility-Administered Medications (Other)  Medication Route  . triamcinolone acetonide (KENALOG-40) injection 4 mg Intravitreal      REVIEW OF SYSTEMS: ROS    Positive for: Gastrointestinal, Musculoskeletal, Cardiovascular, Eyes   Negative for: Constitutional, Neurological, Skin, Genitourinary, HENT, Endocrine, Respiratory, Psychiatric, Allergic/Imm, Heme/Lymph   Last edited by Theodore Demark, COA on 09/03/2019  8:24 AM. (History)       ALLERGIES Allergies  Allergen Reactions  . Atorvastatin Other (See Comments)    Muscle aches/ memory loss  . Penicillins Itching, Nausea Only and Other (See Comments)    Did it involve swelling of the face/tongue/throat, SOB, or low BP? Unknown Did it involve sudden or severe rash/hives, skin peeling, or any reaction on the inside of your mouth or nose? Itching  Did you need to seek medical attention at a hospital or doctor's office? No When did it last happen? 1970s If all above answers are "NO", may proceed with cephalosporin use.   . Sulfa Antibiotics Nausea Only    PAST MEDICAL HISTORY Past Medical History:  Diagnosis Date  . Anxiety   . Arthritis    back. neck  . GERD (gastroesophageal reflux disease)   . History of palpitations   . HOH (hard of hearing) 06/28/2019  . Hyperlipidemia    diet controlled and fish oil  . MVP (mitral valve prolapse)    never has caused any problems per  patient on 06/27/19  . PONV (postoperative nausea and vomiting)    No problems in 08/2018; however has had previous problems  . Pre-diabetes    diet controlled, no med  . Retinal detachment    OU  . SVD (spontaneous vaginal delivery)    x 2   Past Surgical History:  Procedure Laterality Date  . ABDOMINAL HYSTERECTOMY  1970's  . AIR/FLUID EXCHANGE Left 06/28/2019   Procedure: Air/Fluid Exchange;  Surgeon: Bernarda Caffey, MD;  Location: [facility];  Service: Ophthalmology;  Laterality: Left;  . CATARACT EXTRACTION Bilateral    Dr. Talbert Forest  . COLONOSCOPY     polyp  . EYE SURGERY Bilateral    Cat Sx and RD repair  . GAS INSERTION Right 11/26/2014   Procedure: INSERTION OF GAS;  Surgeon: Hayden Pedro, MD;  Location: [facility];  Service: Ophthalmology;  Laterality: Right;  C3F8  . GAS/FLUID EXCHANGE Left 08/21/2018   Procedure: GAS/FLUID EXCHANGE;  Surgeon: Bernarda Caffey, MD;  Location: [facility];  Service: Ophthalmology;  Laterality: Left;  C3F8  . KNEE ARTHROSCOPY Left   . LASER PHOTO ABLATION Left 06/28/2019   Procedure: Laser Photo Ablation;  Surgeon: Bernarda Caffey, MD;  Location: [facility];  Service: Ophthalmology;  Laterality: Left;  . PARS PLANA VITRECTOMY Left 06/28/2019   Procedure: PARS PLANA VITRECTOMY WITH 25 GAUGE;  Surgeon: Bernarda Caffey, MD;  Location: [facility];  Service: Ophthalmology;  Laterality: Left;  . PHOTOCOAGULATION WITH LASER Right 11/26/2014   Procedure: PHOTOCOAGULATION WITH LASER;  Surgeon: Hayden Pedro, MD;  Location: [facility];  Service: Ophthalmology;  Laterality: Right;  . PHOTOCOAGULATION WITH LASER Left 08/21/2018   Procedure: PHOTOCOAGULATION WITH LASER;  Surgeon: Bernarda Caffey, MD;  Location: [facility];  Service: Ophthalmology;  Laterality: Left;  . REPAIR OF COMPLEX TRACTION RETINAL DETACHMENT Left 12/06/2018   Procedure: REPAIR OF COMPLEX TRACTION RETINAL DETACHMENT, 25 gauge vitrectomy, endolaser photocoagulation, memebrane peel, perfluoron injection,  and silicone oil;  Surgeon:  Bernarda Caffey, MD;  Location: [facility];  Service: Ophthalmology;  Laterality: Left;  . RETINAL DETACHMENT SURGERY Bilateral    SBP OD - Dr. Tempie Hoist (1.12.16).  SBP - Dr. Bernarda Caffey (10.7.19)  . SCLERAL BUCKLE Right 11/26/2014   Procedure: SCLERAL BUCKLE RIGHT EYE ;  Surgeon: Hayden Pedro, MD;  Location: [facility];  Service: Ophthalmology;  Laterality: Right;  . SCLERAL BUCKLE WITH POSSIBLE 25 GAUGE PARS PLANA VITRECTOMY Left 08/21/2018   Procedure: SCLERAL BUCKLE WITH 25 GAUGE PARS PLANA VITRECTOMY;  Surgeon: Bernarda Caffey, MD;  Location: [facility];  Service: Ophthalmology;  Laterality: Left;  . SILICON OIL REMOVAL Left 7/62/8315   Procedure: Silicon Oil Removal;  Surgeon: Bernarda Caffey, MD;  Location: [facility];  Service: Ophthalmology;  Laterality: Left;  . TONSILLECTOMY      FAMILY HISTORY Family History  Problem Relation Age of Onset  .  Cancer Mother   . Heart failure Father     SOCIAL HISTORY Social History   Tobacco Use  . Smoking status: Former Smoker    Packs/day: 0.50    Years: 10.00    Pack years: 5.00    Types: Cigarettes    Quit date: 1970    Years since quitting: 50.8  . Smokeless tobacco: Never Used  . Tobacco comment: quit in 1970's  Substance Use Topics  . Alcohol use: No  . Drug use: No         OPHTHALMIC EXAM:  Base Eye Exam    Visual Acuity (Snellen - Linear)      Right Left   Dist cc 20/40 -2 20/150 -2   Dist ph cc 20/30 -1 20/70 +2   Correction: Glasses       Tonometry (Tonopen, 8:20 AM)      Right Left   Pressure 17 15       Pupils      Dark Light Shape React APD   Right 3 2 Round Brisk None   Left 4 3 Round Slow None       Visual Fields (Counting fingers)      Left Right    Full    Restrictions  Partial outer superior temporal, superior nasal deficiencies       Extraocular Movement      Right Left    Full, Ortho Full, Ortho       Neuro/Psych    Oriented x3: Yes   Mood/Affect: Normal       Dilation    Both eyes: 1.0%  Mydriacyl, 2.5% Phenylephrine @ 8:19 AM        Slit Lamp and Fundus Exam    Slit Lamp Exam      Right Left   Lids/Lashes Mild Meibomian gland dysfunction, Telangiectasia, Ptosis Ptosis, Dermatochalasis - upper lid, mild UL Telangiectasia   Conjunctiva/Sclera White and quiet Injection improved; focal nodular scleritis improved--3x4 mm oblong nodule ST quadrant - trace injection overlying   Cornea 1-2+ Punctate epithelial erosions, Temporal Well healed cataract wounds, Debris in tear film 2+ inferior Punctate epithelial erosions   Anterior Chamber Deep and quiet Deep, clear   Iris Round and dilated Round and dilated   Lens PC IOL in good position, trace PCO PC IOL in good position, trace Posterior capsular opacification   Vitreous Vitreous syneresis, mild pigment in anterior vitreous, Posterior vitreous detachment; scattered vitreous debris Post vitrectomy, clear, 1-2+pigment       Fundus Exam      Right Left   Disc Pink and Sharp Sharp rim, mild tilt, trace temporal pallor, temporal Peripapillary atrophy   C/D Ratio 0.2 0.3   Macula Blunted foveal reflex, Retinal pigment epithelial mottling, ERM, No heme or edema Flat, good foveal reflex, mild epiretinal membrane, mild Retinal pigment epithelial mottling   Vessels Vascular attenuation Vascular attenuation   Periphery Attached over scleral buckle, good buckle height, good scarring over buckle Retina attached over buckle; good buckle height; good laser surrounding buckle w/ new row posterior to buckle; +fibrosis at 0300 over buckle; retinotomy from 0300-0430 with good laser surrounding        Refraction    Wearing Rx      Sphere Cylinder Axis Add   Right -1.00 Sphere  +2.50   Left [place] +1.00 025 +2.50          IMAGING AND PROCEDURES  Imaging and Procedures for '@TODAY' @  OCT, Retina - OU - Both  Eyes       Right Eye Quality was good. Central Foveal Thickness: 273. Progression has been stable. Findings include normal foveal  contour, no IRF, no SRF, epiretinal membrane (stable).   Left Eye Quality was good. Central Foveal Thickness: 251. Progression has been stable. Findings include epiretinal membrane, normal foveal contour, no SRF, no IRF.   Notes *Images captured and stored on drive  Diagnosis / Impression:  OD: mild ERM; NFP; no IRF/SRF-stable from prior OS: retina remains attached; NFP; no IRF/SRF; tr ERM  Clinical management:  See below  Abbreviations: NFP - Normal foveal profile. CME - cystoid macular edema. PED - pigment epithelial detachment. IRF - intraretinal fluid. SRF - subretinal fluid. EZ - ellipsoid zone. ERM - epiretinal membrane. ORA - outer retinal atrophy. ORT - outer retinal tubulation. SRHM - subretinal hyper-reflective material                 ASSESSMENT/PLAN:    ICD-10-CM   1. Left retinal detachment  H33.22   2. 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 Retinal edema  H35.81 OCT, Retina - OU - Both Eyes  6. CME (cystoid macular edema), left  H35.352   7. History of retinal detachment  Z86.69   8. Pseudophakia of both eyes  Z96.1     1-3. Retinal detachment, OS - originally: macula-sparing inferior retinal detachment from 3-6 oclock - large HST at 0500 and 2 small tears at 300 within the detached retina - s/pSBP + PPV/PFC/EL/FAX/14% C3F8 OS, 10.07.19 - progressive fibrosis/PVR just posterior to buckle around 0400 with +SRF tracking posterior to buckle -- focal tractional/PVR detachment OS - s/p TWS/FK/CLE/[id number] silicon oil OS, 6.38.4665  - now s/p 99J PPV with silicon oil removal (57.01.77)  - retina attachedand in good position -- good buckle height and laser over buckle and around retinotomy - IOP okay at 15 today             - tylenol/ibuprofen for pain  - f/u 4 weeks  4.  Nodular scleritis OS  - recurrent episode following the tapering of meds (PO pred and topical PF/Prolensa) - sup temporal quadrant -- improvin   - 3x4 mm nodule with surrounding inflammation             - limited lab work to rule out rheumatologic cause--all results negative   CBC, CMP   HIV   HLA Panel   ANA   ANCA   ACE, Lysozyme   RF   ESR, CRP - cont Durezol and Prolensa QID OS  - reduce po prednisone to 47m until next appt - pt already on prilosec for GI prophylaxis  - [facility] to [facility] would recommend Dr. PRamonita Lab-- pt wishes to hold off for now  - f/u 1 week  5,6. CME OS -- currently resolved  - mild post-op CME  - initially treated w/ PF and ketorolac -- had interval increase in CME  - s/p sub-tenons kenalog (03.13.20)  7. History of retinal detachment OD  - S/P SBP OD 11/26/14 -- Dr. JTempie Hoist - looks great -- retina in great position  - monitor  8. Pseudophakia OU  - s/p CE/IOL OU ([place])  - doing well  - monitor   Ophthalmic Meds Ordered this visit:  No orders of the  defined types were placed in this encounter.      Return in about 1 week (around 09/10/2019) for f/u nodular scleritis OS, DFE, OCT.  There are no Patient Instructions on file for this visit.   Explained the diagnoses, plan, and follow up with the patient and they expressed understanding.  Patient expressed understanding of the importance of proper follow up care.   This document serves as a record of services personally performed by Gardiner Sleeper, MD, PhD. It was created on their behalf by Roselee Nova, COMT. The creation of this record is the provider's dictation and/or activities during the visit.  Electronically signed by: Roselee Nova, COMT 09/03/19 1:11 PM  Gardiner Sleeper, M.D., Ph.D. Diseases & Surgery of the Retina and [place] 09/03/19  I have reviewed the above documentation for accuracy and completeness, and I agree with the above. Gardiner Sleeper, M.D., Ph.D. 09/03/19 1:11 PM   Abbreviations: M myopia (nearsighted); A astigmatism; H hyperopia (farsighted); P presbyopia; Mrx spectacle prescription;  CTL contact lenses; OD right eye; OS left eye; OU both eyes  XT exotropia; ET esotropia; PEK punctate epithelial keratitis; PEE punctate epithelial erosions; DES dry eye syndrome; MGD meibomian gland dysfunction; ATs artificial tears; PFAT's preservative free artificial tears; [place] nuclear sclerotic cataract; PSC posterior subcapsular cataract; ERM epi-retinal membrane; PVD posterior vitreous detachment; RD retinal detachment; DM diabetes mellitus; DR diabetic retinopathy; NPDR non-proliferative diabetic retinopathy; PDR proliferative diabetic retinopathy; CSME clinically significant macular edema; DME diabetic macular edema; dbh dot blot hemorrhages; CWS cotton wool spot; POAG primary open angle glaucoma; C/D cup-to-disc ratio; HVF humphrey visual field; GVF goldmann visual field; OCT optical coherence tomography; IOP intraocular pressure; BRVO Branch retinal vein occlusion; CRVO central retinal vein occlusion; CRAO central retinal artery occlusion; BRAO branch retinal artery occlusion; RT retinal tear; SB scleral buckle; PPV pars plana vitrectomy; VH Vitreous hemorrhage; PRP panretinal laser photocoagulation; IVK intravitreal kenalog; VMT vitreomacular traction; MH Macular hole;  NVD neovascularization of the disc; NVE neovascularization elsewhere; AREDS age related eye disease study; ARMD age related macular degeneration; POAG primary open angle glaucoma; EBMD epithelial/anterior basement membrane dystrophy; ACIOL anterior chamber intraocular lens; IOL intraocular lens; PCIOL posterior chamber intraocular lens; Phaco/IOL phacoemulsification with intraocular lens placement; Stephens photorefractive keratectomy; LASIK  laser assisted in situ keratomileusis; HTN hypertension; DM diabetes mellitus; COPD chronic obstructive pulmonary disease

## 2019-09-03 ENCOUNTER — Encounter (INDEPENDENT_AMBULATORY_CARE_PROVIDER_SITE_OTHER): Payer: Self-pay | Admitting: Ophthalmology

## 2019-09-03 ENCOUNTER — Other Ambulatory Visit: Payer: Self-pay

## 2019-09-03 ENCOUNTER — Ambulatory Visit (INDEPENDENT_AMBULATORY_CARE_PROVIDER_SITE_OTHER): Payer: Medicare Other | Admitting: Ophthalmology

## 2019-09-03 DIAGNOSIS — H3342 Traction detachment of retina, left eye: Secondary | ICD-10-CM

## 2019-09-03 DIAGNOSIS — H3581 Retinal edema: Secondary | ICD-10-CM

## 2019-09-03 DIAGNOSIS — H40052 Ocular hypertension, left eye: Secondary | ICD-10-CM

## 2019-09-03 DIAGNOSIS — H15092 Other scleritis, left eye: Secondary | ICD-10-CM

## 2019-09-03 DIAGNOSIS — H3322 Serous retinal detachment, left eye: Secondary | ICD-10-CM | POA: Diagnosis not present

## 2019-09-03 DIAGNOSIS — H35352 Cystoid macular degeneration, left eye: Secondary | ICD-10-CM

## 2019-09-03 DIAGNOSIS — Z8669 Personal history of other diseases of the nervous system and sense organs: Secondary | ICD-10-CM

## 2019-09-03 DIAGNOSIS — Z961 Presence of intraocular lens: Secondary | ICD-10-CM

## 2019-09-07 ENCOUNTER — Other Ambulatory Visit (INDEPENDENT_AMBULATORY_CARE_PROVIDER_SITE_OTHER): Payer: Self-pay

## 2019-09-07 MED ORDER — PROLENSA 0.07 % OP SOLN: 1.0000 [drp] | Freq: Four times a day (QID) | OPHTHALMIC | 2 refills | Status: DC

## 2019-09-09 ENCOUNTER — Other Ambulatory Visit (INDEPENDENT_AMBULATORY_CARE_PROVIDER_SITE_OTHER): Payer: Self-pay | Admitting: Ophthalmology

## 2019-09-10 ENCOUNTER — Ambulatory Visit (INDEPENDENT_AMBULATORY_CARE_PROVIDER_SITE_OTHER): Payer: Medicare Other | Admitting: Ophthalmology

## 2019-09-10 ENCOUNTER — Encounter (INDEPENDENT_AMBULATORY_CARE_PROVIDER_SITE_OTHER): Payer: Self-pay | Admitting: Ophthalmology

## 2019-09-10 DIAGNOSIS — H15092 Other scleritis, left eye: Secondary | ICD-10-CM | POA: Diagnosis not present

## 2019-09-10 DIAGNOSIS — H3581 Retinal edema: Secondary | ICD-10-CM | POA: Diagnosis not present

## 2019-09-10 DIAGNOSIS — H3322 Serous retinal detachment, left eye: Secondary | ICD-10-CM

## 2019-09-10 DIAGNOSIS — H40052 Ocular hypertension, left eye: Secondary | ICD-10-CM | POA: Diagnosis not present

## 2019-09-10 DIAGNOSIS — H3342 Traction detachment of retina, left eye: Secondary | ICD-10-CM

## 2019-09-10 DIAGNOSIS — Z8669 Personal history of other diseases of the nervous system and sense organs: Secondary | ICD-10-CM

## 2019-09-10 DIAGNOSIS — Z961 Presence of intraocular lens: Secondary | ICD-10-CM

## 2019-09-10 DIAGNOSIS — H35352 Cystoid macular degeneration, left eye: Secondary | ICD-10-CM

## 2019-09-10 NOTE — Progress Notes (Signed)
[facility] Note  09/10/2019     CHIEF COMPLAINT Patient presents for Retina Follow Up   HISTORY OF PRESENT ILLNESS: Carla May is a 78 y.o. female who presents to the clinic today for:   HPI    Retina Follow Up    Patient presents with  Other.  In left eye.  This started 1 week ago.  Severity is moderate.  I, the attending physician,  performed the HPI with the patient and updated documentation appropriately.          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 Patient states today vision a little fuzzy. Some days really good. No eye pain.        Last edited by Bernarda Caffey, MD on 09/10/2019 10:54 AM. (History)    pt states the nodule may be a little smaller, but is still "hanging in there", she is down to 34m po prednisone, pt is ready for referral to conea/scleritis specialist at [facility]  Referring physician: [place]yn [place] [id number] HISTORICAL INFORMATION:   Selected notes from the MPresidioReferred by Dr. MLen Blalockfor concern of retinal hole LEE: (Mellody Memos [BCVA: OD: OS:] Ocular Hx- PMH-anxiety, arthritis, HLD    CURRENT MEDICATIONS: Current Outpatient Medications (Ophthalmic Drugs)  Medication Sig  . bacitracin-polymyxin b (POLYSPORIN) ophthalmic ointment Place 1 application into the left eye at bedtime as needed.  . carboxymethylcellulose (REFRESH PLUS) 0.5 % SOLN Place 1 drop into both eyes 3 (three) times daily as needed (for dry eyes).   . dorzolamide-timolol (COSOPT) 22.3-6.8 MG/ML ophthalmic solution Place 1 drop into the left eye 2 (two) times daily.  . DUREZOL 0.05 % EMUL Place 1 drop into the left eye 3 (three) times daily.  .Marland KitchenPROLENSA 0.07 % SOLN Place 1 drop into the left eye 4 (four) times daily.   No current facility-administered medications for this visit.  (Ophthalmic Drugs)   Current Outpatient Medications  (Other)  Medication Sig  . aspirin EC 81 MG tablet Take 81 mg by mouth daily.  .Marland Kitchenibuprofen (ADVIL) 800 MG tablet Take 800 mg by mouth every 8 (eight) hours as needed.  . Multiple Vitamin (MULTIVITAMIN WITH MINERALS) TABS tablet Take 1 tablet by mouth daily. One-A-Day for Women 50+  . nabumetone (RELAFEN) 500 MG tablet Take 750 mg by mouth 3 (three) times daily.  . Omega-3 Fatty Acids (FISH OIL ULTRA) 1400 MG CAPS Take 1,400 mg by mouth daily.  .Marland Kitchenomeprazole (PRILOSEC OTC) 20 MG tablet Take 20 mg by mouth daily.  . predniSONE (DELTASONE) 20 MG tablet Take 3 tablets (60 mg total) by mouth daily.   Current Facility-Administered Medications (Other)  Medication Route  . triamcinolone acetonide (KENALOG-40) injection 4 mg Intravitreal      REVIEW OF SYSTEMS: ROS    Positive for: Gastrointestinal, Musculoskeletal, Cardiovascular, Eyes   Negative for: Constitutional, Neurological, Skin, Genitourinary, HENT, Endocrine, Respiratory, Psychiatric, Allergic/Imm, Heme/Lymph   Last edited by CTheodore Demark COA on 09/10/2019 10:01 AM. (History)       ALLERGIES Allergies  Allergen Reactions  . Atorvastatin Other (See Comments)    Muscle aches/ memory loss  . Penicillins Itching, Nausea Only and Other (See Comments)    Did it involve swelling of the face/tongue/throat, SOB, or low BP? Unknown Did it involve sudden or severe rash/hives, skin peeling, or any reaction on  the inside of your mouth or nose? Itching Did you need to seek medical attention at a hospital or doctor's office? No When did it last happen? 1970s If all above answers are "NO", may proceed with cephalosporin use.   . Sulfa Antibiotics Nausea Only    PAST MEDICAL HISTORY Past Medical History:  Diagnosis Date  . Anxiety   . Arthritis    back. neck  . GERD (gastroesophageal reflux disease)   . History of palpitations   . HOH (hard of hearing) 06/28/2019  . Hyperlipidemia    diet controlled and fish oil  . MVP (mitral  valve prolapse)    never has caused any problems per patient on 06/27/19  . PONV (postoperative nausea and vomiting)    No problems in 08/2018; however has had previous problems  . Pre-diabetes    diet controlled, no med  . Retinal detachment    OU  . SVD (spontaneous vaginal delivery)    x 2   Past Surgical History:  Procedure Laterality Date  . ABDOMINAL HYSTERECTOMY  1970's  . AIR/FLUID EXCHANGE Left 06/28/2019   Procedure: Air/Fluid Exchange;  Surgeon: Bernarda Caffey, MD;  Location: [place];  Service: Ophthalmology;  Laterality: Left;  . CATARACT EXTRACTION Bilateral    Dr. Talbert Forest  . COLONOSCOPY     polyp  . EYE SURGERY Bilateral    Cat Sx and RD repair  . GAS INSERTION Right 11/26/2014   Procedure: INSERTION OF GAS;  Surgeon: Hayden Pedro, MD;  Location: [place];  Service: Ophthalmology;  Laterality: Right;  C3F8  . GAS/FLUID EXCHANGE Left 08/21/2018   Procedure: GAS/FLUID EXCHANGE;  Surgeon: Bernarda Caffey, MD;  Location: [place];  Service: Ophthalmology;  Laterality: Left;  C3F8  . KNEE ARTHROSCOPY Left   . LASER PHOTO ABLATION Left 06/28/2019   Procedure: Laser Photo Ablation;  Surgeon: Bernarda Caffey, MD;  Location: [place];  Service: Ophthalmology;  Laterality: Left;  . PARS PLANA VITRECTOMY Left 06/28/2019   Procedure: PARS PLANA VITRECTOMY WITH 25 GAUGE;  Surgeon: Bernarda Caffey, MD;  Location: [place];  Service: Ophthalmology;  Laterality: Left;  . PHOTOCOAGULATION WITH LASER Right 11/26/2014   Procedure: PHOTOCOAGULATION WITH LASER;  Surgeon: Hayden Pedro, MD;  Location: [place];  Service: Ophthalmology;  Laterality: Right;  . PHOTOCOAGULATION WITH LASER Left 08/21/2018   Procedure: PHOTOCOAGULATION WITH LASER;  Surgeon: Bernarda Caffey, MD;  Location: [place];  Service: Ophthalmology;  Laterality: Left;  . REPAIR OF COMPLEX TRACTION RETINAL DETACHMENT Left 12/06/2018   Procedure: REPAIR OF COMPLEX TRACTION RETINAL DETACHMENT, 25 gauge vitrectomy, endolaser photocoagulation, memebrane peel,  perfluoron injection,  and silicone oil;  Surgeon: Bernarda Caffey, MD;  Location: [place];  Service: Ophthalmology;  Laterality: Left;  . RETINAL DETACHMENT SURGERY Bilateral    SBP OD - Dr. Tempie Hoist (1.12.16).  SBP - Dr. Bernarda Caffey (10.7.19)  . SCLERAL BUCKLE Right 11/26/2014   Procedure: SCLERAL BUCKLE RIGHT EYE ;  Surgeon: Hayden Pedro, MD;  Location: [place];  Service: Ophthalmology;  Laterality: Right;  . SCLERAL BUCKLE WITH POSSIBLE 25 GAUGE PARS PLANA VITRECTOMY Left 08/21/2018   Procedure: SCLERAL BUCKLE WITH 25 GAUGE PARS PLANA VITRECTOMY;  Surgeon: Bernarda Caffey, MD;  Location: [place];  Service: Ophthalmology;  Laterality: Left;  . SILICON OIL REMOVAL Left 02/21/8118   Procedure: Silicon Oil Removal;  Surgeon: Bernarda Caffey, MD;  Location: [place];  Service: Ophthalmology;  Laterality: Left;  . TONSILLECTOMY      FAMILY HISTORY Family History  Problem Relation Age of Onset  . Cancer Mother   . Heart failure Father     SOCIAL HISTORY Social History   Tobacco Use  . Smoking status: Former Smoker    Packs/day: 0.50    Years: 10.00    Pack years: 5.00    Types: Cigarettes    Quit date: 1970    Years since quitting: 50.8  . Smokeless tobacco: Never Used  . Tobacco comment: quit in 1970's  Substance Use Topics  . Alcohol use: No  . Drug use: No         OPHTHALMIC EXAM:  Base Eye Exam    Visual Acuity (Snellen - Linear)      Right Left   Dist cc 20/40 +1 20/150 -1   Dist ph cc 20/40 +2 20/80   Correction: Glasses       Tonometry (Tonopen, 9:58 AM)      Right Left   Pressure 13 14       Pupils      Dark Light Shape React APD   Right 3 2 Round Brisk None   Left 4 3 Round Slow None       Visual Fields      Left Right    Full    Restrictions  Partial outer superior temporal, superior nasal deficiencies       Extraocular Movement      Right Left    Full, Ortho Full, Ortho       Neuro/Psych    Oriented x3: Yes   Mood/Affect: Normal        Dilation    Both eyes: 1.0% Mydriacyl, 2.5% Phenylephrine @ 9:58 AM        Slit Lamp and Fundus Exam    Slit Lamp Exam      Right Left   Lids/Lashes Mild Meibomian gland dysfunction, Telangiectasia, Ptosis Ptosis, Dermatochalasis - upper lid, mild UL Telangiectasia   Conjunctiva/Sclera White and quiet Injection improved; focal nodular scleritis improved--2.5x4 mm oblong nodule ST quadrant - trace injection overlying ?salmon colored   Cornea 1-2+ Punctate epithelial erosions, Temporal Well healed cataract wounds, Debris in tear film 2+ inferior Punctate epithelial erosions   Anterior Chamber Deep and quiet Deep, clear   Iris Round and dilated Round and dilated   Lens PC IOL in good position, trace PCO PC IOL in good position, trace Posterior capsular opacification   Vitreous Vitreous syneresis, mild pigment in anterior vitreous, Posterior vitreous detachment; scattered vitreous debris Post vitrectomy, clear, 1-2+pigment       Fundus Exam      Right Left   Disc Pink and Sharp Sharp rim, mild tilt, trace temporal pallor, temporal Peripapillary atrophy   C/D Ratio 0.2 0.3   Macula Blunted foveal reflex, Retinal pigment epithelial mottling, ERM, No heme or edema Flat, good foveal reflex, mild epiretinal membrane, mild Retinal pigment epithelial mottling   Vessels Vascular attenuation Vascular attenuation   Periphery Attached over scleral buckle, good buckle height, good scarring over buckle Retina attached over buckle; good buckle height; good laser surrounding buckle w/ new row posterior to buckle; +fibrosis at 0300 over buckle; retinotomy from 0300-0430 with good laser surrounding        Refraction    Wearing Rx      Sphere Cylinder Axis Add   Right -1.00 Sphere  +2.50   Left [place] +1.00 025 +2.50          IMAGING AND PROCEDURES  Imaging and Procedures for '@TODAY' @  OCT, Retina - OU - Both Eyes       Right Eye Quality was good. Central Foveal Thickness: 271. Progression has  been stable. Findings include normal foveal contour, no IRF, no SRF, epiretinal membrane (stable).   Left Eye Quality was good. Central Foveal Thickness: 246. Progression has been stable. Findings include epiretinal membrane, normal foveal contour, no SRF, no IRF.   Notes *Images captured and stored on drive  Diagnosis / Impression:  OD: mild ERM; NFP; no IRF/SRF-stable from prior OS: retina remains attached; NFP; no IRF/SRF; tr ERM  Clinical management:  See below  Abbreviations: NFP - Normal foveal profile. CME - cystoid macular edema. PED - pigment epithelial detachment. IRF - intraretinal fluid. SRF - subretinal fluid. EZ - ellipsoid zone. ERM - epiretinal membrane. ORA - outer retinal atrophy. ORT - outer retinal tubulation. SRHM - subretinal hyper-reflective material                 ASSESSMENT/PLAN:    ICD-10-CM   1. Left retinal detachment  H33.22   2. 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 Retinal edema  H35.81 OCT, Retina - OU - Both Eyes  6. CME (cystoid macular edema), left  H35.352   7. History of retinal detachment  Z86.69   8. Pseudophakia of both eyes  Z96.1     1-3. Retinal detachment, OS - originally: macula-sparing inferior retinal detachment from 3-6 oclock - large HST at 0500 and 2 small tears at 300 within the detached retina - s/pSBP + PPV/PFC/EL/FAX/14% C3F8 OS, 10.07.19 - progressive fibrosis/PVR just posterior to buckle around 0400 with +SRF tracking posterior to buckle -- focal tractional/PVR detachment OS - s/p CBU/LA/GTX/MIWOEHOZYY/QM/2500BB silicon oil OS, 0.48.8891  - now s/p 69I PPV with silicon oil removal (50.38.88)  - retina attachedand in good position -- good buckle height and laser over buckle and around retinotomy - IOP okay at 14 today  4. Nodular scleritis  OS  - recurrent episode following the tapering of meds (PO pred and topical PF/Prolensa) - sup temporal quadrant -- improving  - 2.5x4 mm nodule with surrounding inflammation improved --?salmon patch             - limited lab work to rule out rheumatologic cause--all results negative   CBC, CMP   HIV   HLA Panel   ANA   ANCA   ACE, Lysozyme   RF   ESR, CRP - cont Durezol and Prolensa QID OS  - continue po prednisone 44m - pt already on prilosec for GI prophylaxis  - [facility] PR[facility] Labfor recurrent / persistent scleritis  - f/u 2 week  5,6. CME OS -- currently resolved  - mild post-op CME  - initially treated w/ PF and ketorolac -- had interval increase in CME  - s/p sub-tenons kenalog (03.13.20)  7. History of retinal detachment OD  - S/P SBP OD 11/26/14 -- Dr. JTempie Hoist - looks great -- retina in great position  - monitor  8. Pseudophakia OU  - s/p CE/IOL OU ([place])  - doing well  - monitor   Ophthalmic Meds Ordered this visit:  No orders of the defined types were placed in this encounter.      Return in about 2 weeks (around 09/24/2019) for f/u nodular scleritis OS, DFE, OCT.  There  are no Patient Instructions on file for this visit.   Explained the diagnoses, plan, and follow up with the patient and they expressed understanding.  Patient expressed understanding of the importance of proper follow up care.    Electronically signed by: Leeann Must, COA   Gardiner Sleeper, M.D., Ph.D. Diseases & Surgery of the Retina and Vitreous [facility]   I have reviewed the above documentation for accuracy and completeness, and I agree with the above. Gardiner Sleeper, M.D., Ph.D. 09/10/19 10:13 PM    Abbreviations: M myopia (nearsighted); A astigmatism; H hyperopia (farsighted); P presbyopia; Mrx spectacle prescription;  CTL contact lenses; OD  right eye; OS left eye; OU both eyes  XT exotropia; ET esotropia; PEK punctate epithelial keratitis; PEE punctate epithelial erosions; DES dry eye syndrome; MGD meibomian gland dysfunction; ATs artificial tears; PFAT's preservative free artificial tears; [place] nuclear sclerotic cataract; PSC posterior subcapsular cataract; ERM epi-retinal membrane; PVD posterior vitreous detachment; RD retinal detachment; DM diabetes mellitus; DR diabetic retinopathy; NPDR non-proliferative diabetic retinopathy; PDR proliferative diabetic retinopathy; CSME clinically significant macular edema; DME diabetic macular edema; dbh dot blot hemorrhages; CWS cotton wool spot; POAG primary open angle glaucoma; C/D cup-to-disc ratio; HVF humphrey visual field; GVF goldmann visual field; OCT optical coherence tomography; IOP intraocular pressure; BRVO Branch retinal vein occlusion; CRVO central retinal vein occlusion; CRAO central retinal artery occlusion; BRAO branch retinal artery occlusion; RT retinal tear; SB scleral buckle; PPV pars plana vitrectomy; VH Vitreous hemorrhage; PRP panretinal laser photocoagulation; IVK intravitreal kenalog; VMT vitreomacular traction; MH Macular hole;  NVD neovascularization of the disc; NVE neovascularization elsewhere; AREDS age related eye disease study; ARMD age related macular degeneration; POAG primary open angle glaucoma; EBMD epithelial/anterior basement membrane dystrophy; ACIOL anterior chamber intraocular lens; IOL intraocular lens; PCIOL posterior chamber intraocular lens; Phaco/IOL phacoemulsification with intraocular lens placement; [place] photorefractive keratectomy; LASIK laser assisted in situ keratomileusis; HTN hypertension; DM diabetes mellitus; COPD chronic obstructive pulmonary disease

## 2019-09-21 NOTE — Progress Notes (Addendum)
[facility] Note  09/25/2019     CHIEF COMPLAINT Patient presents for Retina Follow Up   HISTORY OF PRESENT ILLNESS: Carla May is a 78 y.o. female who presents to the clinic today for:   HPI    Retina Follow Up    Patient presents with  Other.  In left eye.  This started months ago.  Severity is moderate.  Duration of 2 weeks.  Since onset it is stable.  I, the attending physician,  performed the HPI with the patient and updated documentation appropriately.          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  No change in [place] OU.  VA OS still very blurred, and pt finding it hard to see up close.  Denies pain, flashes, but has occasional small floaters OU, and several times a day sees a "squiggle of light" float across her left inferior vision.  Sees Dr. Ramonita Lab at [facility] tomorrow for second opinion.  Durezol and Prolensa QID OS Prednisone 40 mg PO daily       Last edited by Bernarda Caffey, MD on 09/29/2019  3:40 PM. (History)    pt states she has an appt with Dr. Isaiah Blakes at [facility], she is still taking 31m po pred and using Durezol / Prolensa qid OS  Referring physician: [place]alyn [place]  [place] [id number] HISTORICAL INFORMATION:   Selected notes from the MEDICAL RECORD NUMBER Referred by Dr. MLen Blalockfor concern of retinal hole    CURRENT MEDICATIONS: Current Outpatient Medications (Ophthalmic Drugs)  Medication Sig  . bacitracin-polymyxin b (POLYSPORIN) ophthalmic ointment Place 1 application into the left eye at bedtime as needed.  . carboxymethylcellulose (REFRESH PLUS) 0.5 % SOLN Place 1 drop into both eyes 3 (three) times daily as needed (for dry eyes).   . dorzolamide-timolol (COSOPT) 22.3-6.8 MG/ML ophthalmic solution Place 1 drop into the left eye 2 (two) times daily.  . DUREZOL 0.05 % EMUL Place 1 drop into the left eye 3 (three) times daily.  .Marland Kitchen PROLENSA 0.07 % SOLN Place 1 drop into the left eye 4 (four) times daily.   No current facility-administered medications for this visit.  (Ophthalmic Drugs)   Current Outpatient Medications (Other)  Medication Sig  . aspirin EC 81 MG tablet Take 81 mg by mouth daily.  .Marland Kitchenibuprofen (ADVIL) 800 MG tablet Take 800 mg by mouth every 8 (eight) hours as needed.  . Multiple Vitamin (MULTIVITAMIN WITH MINERALS) TABS tablet Take 1 tablet by mouth daily. One-A-Day for Women 50+  . nabumetone (RELAFEN) 500 MG tablet Take 750 mg by mouth 3 (three) times daily.  . Omega-3 Fatty Acids (FISH OIL ULTRA) 1400 MG CAPS Take 1,400 mg by mouth daily.  .Marland Kitchenomeprazole (PRILOSEC OTC) 20 MG tablet Take 20 mg by mouth daily.  . predniSONE (DELTASONE) 20 MG tablet Take 3 tablets (60 mg total) by mouth daily.   Current Facility-Administered Medications (Other)  Medication Route  . triamcinolone acetonide (KENALOG-40) injection 4 mg Intravitreal      REVIEW OF SYSTEMS: ROS    Positive for: HENT, Eyes   Negative for: Constitutional, Gastrointestinal, Neurological, Skin, Genitourinary, Musculoskeletal, Endocrine, Cardiovascular, Respiratory, Psychiatric, Allergic/Imm, Heme/Lymph   Last edited by BMatthew Folks COA on 09/25/2019 10:03 AM. (History)       ALLERGIES Allergies  Allergen Reactions  . Atorvastatin Other (  See Comments)    Muscle aches/ memory loss  . Penicillins Itching, Nausea Only and Other (See Comments)    Did it involve swelling of the face/tongue/throat, SOB, or low BP? Unknown Did it involve sudden or severe rash/hives, skin peeling, or any reaction on the inside of your mouth or nose? Itching Did you need to seek medical attention at a hospital or doctor's office? No When did it last happen? 1970s If all above answers are "NO", may proceed with cephalosporin use.   . Sulfa Antibiotics Nausea Only    PAST MEDICAL HISTORY Past Medical History:  Diagnosis Date  . Anxiety   .  Arthritis    back. neck  . GERD (gastroesophageal reflux disease)   . History of palpitations   . HOH (hard of hearing) 06/28/2019  . Hyperlipidemia    diet controlled and fish oil  . MVP (mitral valve prolapse)    never has caused any problems per patient on 06/27/19  . PONV (postoperative nausea and vomiting)    No problems in 08/2018; however has had previous problems  . Pre-diabetes    diet controlled, no med  . Retinal detachment    OU  . SVD (spontaneous vaginal delivery)    x 2   Past Surgical History:  Procedure Laterality Date  . ABDOMINAL HYSTERECTOMY  1970's  . AIR/FLUID EXCHANGE Left 06/28/2019   Procedure: Air/Fluid Exchange;  Surgeon: Bernarda Caffey, MD;  Location: [place];  Service: Ophthalmology;  Laterality: Left;  . CATARACT EXTRACTION Bilateral    Dr. Talbert Forest  . COLONOSCOPY     polyp  . EYE SURGERY Bilateral    Cat Sx and RD repair  . GAS INSERTION Right 11/26/2014   Procedure: INSERTION OF GAS;  Surgeon: Hayden Pedro, MD;  Location: [place];  Service: Ophthalmology;  Laterality: Right;  C3F8  . GAS/FLUID EXCHANGE Left 08/21/2018   Procedure: GAS/FLUID EXCHANGE;  Surgeon: Bernarda Caffey, MD;  Location: [place];  Service: Ophthalmology;  Laterality: Left;  C3F8  . KNEE ARTHROSCOPY Left   . LASER PHOTO ABLATION Left 06/28/2019   Procedure: Laser Photo Ablation;  Surgeon: Bernarda Caffey, MD;  Location: [place];  Service: Ophthalmology;  Laterality: Left;  . PARS PLANA VITRECTOMY Left 06/28/2019   Procedure: PARS PLANA VITRECTOMY WITH 25 GAUGE;  Surgeon: Bernarda Caffey, MD;  Location: [place];  Service: Ophthalmology;  Laterality: Left;  . PHOTOCOAGULATION WITH LASER Right 11/26/2014   Procedure: PHOTOCOAGULATION WITH LASER;  Surgeon: Hayden Pedro, MD;  Location: [place];  Service: Ophthalmology;  Laterality: Right;  . PHOTOCOAGULATION WITH LASER Left 08/21/2018   Procedure: PHOTOCOAGULATION WITH LASER;  Surgeon: Bernarda Caffey, MD;  Location: [place];  Service: Ophthalmology;   Laterality: Left;  . REPAIR OF COMPLEX TRACTION RETINAL DETACHMENT Left 12/06/2018   Procedure: REPAIR OF COMPLEX TRACTION RETINAL DETACHMENT, 25 gauge vitrectomy, endolaser photocoagulation, memebrane peel, perfluoron injection,  and silicone oil;  Surgeon: Bernarda Caffey, MD;  Location: [place];  Service: Ophthalmology;  Laterality: Left;  . RETINAL DETACHMENT SURGERY Bilateral    SBP OD - Dr. Tempie Hoist (1.12.16).  SBP - Dr. Bernarda Caffey (10.7.19)  . SCLERAL BUCKLE Right 11/26/2014   Procedure: SCLERAL BUCKLE RIGHT EYE ;  Surgeon: Hayden Pedro, MD;  Location: [place];  Service: Ophthalmology;  Laterality: Right;  . SCLERAL BUCKLE WITH POSSIBLE 25 GAUGE PARS PLANA VITRECTOMY Left 08/21/2018   Procedure: SCLERAL BUCKLE WITH 25 GAUGE PARS PLANA VITRECTOMY;  Surgeon: Bernarda Caffey, MD;  Location: [facility]  OR;  Service: Ophthalmology;  Laterality: Left;  . SILICON OIL REMOVAL Left 06/25/9146   Procedure: Silicon Oil Removal;  Surgeon: Bernarda Caffey, MD;  Location: [place];  Service: Ophthalmology;  Laterality: Left;  . TONSILLECTOMY      FAMILY HISTORY Family History  Problem Relation Age of Onset  . Cancer Mother   . Heart failure Father     SOCIAL HISTORY Social History   Tobacco Use  . Smoking status: Former Smoker    Packs/day: 0.50    Years: 10.00    Pack years: 5.00    Types: Cigarettes    Quit date: 1970    Years since quitting: 50.9  . Smokeless tobacco: Never Used  . Tobacco comment: quit in 1970's  Substance Use Topics  . Alcohol use: No  . Drug use: No         OPHTHALMIC EXAM:  Base Eye Exam    Visual Acuity (Snellen - Linear)      Right Left   Dist cc 20/40 20/150   Dist ph cc 20/30 -2 20/70 -2   Correction: Glasses       Tonometry (Tonopen, 10:07 AM)      Right Left   Pressure 13 13       Pupils      Dark Light Shape React APD   Right 3 2 Round Brisk None   Left 4 3 Round Brisk None       Visual Fields (Counting fingers)      Left Right    Full Full        Extraocular Movement      Right Left    Full, Ortho Full, Ortho       Neuro/Psych    Oriented x3: Yes   Mood/Affect: Normal       Dilation    Both eyes: 1.0% Mydriacyl, 2.5% Phenylephrine @ 10:07 AM        Slit Lamp and Fundus Exam    Slit Lamp Exam      Right Left   Lids/Lashes Mild Meibomian gland dysfunction, Telangiectasia, Ptosis Ptosis, Dermatochalasis - upper lid, mild UL Telangiectasia   Conjunctiva/Sclera White and quiet Injection improved; area of mild scleral thickening is 3x3.5 trace injection overlying ?salmon colored   Cornea 1-2+ Punctate epithelial erosions, Temporal Well healed cataract wounds, Debris in tear film 2+ inferior Punctate epithelial erosions   Anterior Chamber Deep and quiet Deep, clear   Iris Round and dilated Round and dilated   Lens PC IOL in good position, trace PCO PC IOL in good position, trace Posterior capsular opacification   Vitreous Vitreous syneresis, mild pigment in anterior vitreous, Posterior vitreous detachment; scattered vitreous debris Post vitrectomy, clear, 1-2+pigment       Fundus Exam      Right Left   Disc Pink and Sharp Sharp rim, mild tilt, trace temporal pallor, temporal Peripapillary atrophy   C/D Ratio 0.2 0.3   Macula Blunted foveal reflex, Retinal pigment epithelial mottling, ERM, No heme or edema Flat, good foveal reflex, mild epiretinal membrane, mild Retinal pigment epithelial mottling   Vessels Vascular attenuation Vascular attenuation   Periphery Attached over scleral buckle, good buckle height, good scarring over buckle Retina attached over buckle; good buckle height; good laser surrounding buckle w/ new row posterior to buckle; +fibrosis at 0300 over buckle; retinotomy from 0300-0430 with good laser surrounding          IMAGING AND PROCEDURES  Imaging and Procedures for '@TODAY' @  OCT,  Retina - OU - Both Eyes       Right Eye Quality was good. Central Foveal Thickness: 276. Progression has been  stable. Findings include normal foveal contour, no IRF, no SRF, epiretinal membrane (stable).   Left Eye Quality was good. Central Foveal Thickness: 246. Progression has been stable. Findings include epiretinal membrane, normal foveal contour, no SRF, no IRF.   Notes *Images captured and stored on drive  Diagnosis / Impression:  OD: mild ERM; NFP; no IRF/SRF-stable from prior OS: retina remains attached; NFP; no IRF/SRF; tr ERM  Clinical management:  See below  Abbreviations: NFP - Normal foveal profile. CME - cystoid macular edema. PED - pigment epithelial detachment. IRF - intraretinal fluid. SRF - subretinal fluid. EZ - ellipsoid zone. ERM - epiretinal membrane. ORA - outer retinal atrophy. ORT - outer retinal tubulation. SRHM - subretinal hyper-reflective material                 ASSESSMENT/PLAN:    ICD-10-CM   1. Left retinal detachment  H33.22   2. 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 Retinal edema  H35.81 OCT, Retina - OU - Both Eyes  6. CME (cystoid macular edema), left  H35.352   7. History of retinal detachment  Z86.69   8. Pseudophakia of both eyes  Z96.1     1-3. Retinal detachment, OS - originally: macula-sparing inferior retinal detachment from 3-6 oclock - large HST at 0500 and 2 small tears at 300 within the detached retina - s/pSBP + PPV/PFC/EL/FAX/14% C3F8 OS, 10.07.19 - progressive fibrosis/PVR just posterior to buckle around 0400 with +SRF tracking posterior to buckle -- focal tractional/PVR detachment OS - s/p PTW/SF/KCL/[id number]/BS/[id number] silicon oil OS, 1.63.8466  - now s/p 59D PPV with silicon oil removal (35.70.17)  - retina attachedand in good position -- good buckle height and laser over buckle and around retinotomy - IOP okay at 13 today  4. Nodular scleritis  OS  - recurrent episode following the tapering of meds (PO pred and topical PF/Prolensa) - sup temporal quadrant -- improving  - 3x3.5 mm nodule with surrounding inflammation improved --?salmon patch -- area is flat now             - limited lab work to rule out rheumatologic cause--all results negative   CBC, CMP   HIV   HLA Panel   ANA   ANCA   ACE, Lysozyme   RF   ESR, CRP - cont Durezol and Prolensa QID OS  - continue po prednisone 70m - pt already on prilosec for GI prophylaxis  [facility] for second opinion on recurrent nodular scleritis  - f/u here 3-4 weeks  5,6. CME OS -- currently resolved  - mild post-op CME  - initially treated w/ PF and ketorolac -- had interval increase in CME  - s/p sub-tenons kenalog (03.13.20)  7. History of retinal detachment OD  - S/P SBP OD 11/26/14 -- Dr. JTempie Hoist - looks great -- retina in great position  - monitor  8. Pseudophakia OU  - s/p CE/IOL OU ([place])  - doing well  - monitor   Ophthalmic Meds Ordered this visit:  No orders of the defined types were placed in this encounter.      Return for f/u 3-4 weeks nodular scleritis OS, DFE, OCT.  There are no Patient Instructions on file for this visit.   Explained the diagnoses, plan, and follow up with the patient and they expressed understanding.  Patient expressed understanding of the importance of proper follow up care.    This document serves as a record of services personally performed by Gardiner Sleeper, MD, PhD. It was created on their behalf by Estill Bakes, COT an ophthalmic technician. The creation of this record is the provider's dictation and/or activities during the visit.    Electronically signed by: Estill Bakes, COT 09/21/19 @ 3:48 PM   This document serves as a record of services personally performed by Gardiner Sleeper, MD, PhD. It was created on their behalf by  Ernest Mallick, OA, an ophthalmic assistant. The creation of this record is the provider's dictation and/or activities during the visit.    Electronically signed by: Ernest Mallick, OA 11.10.2020 3:48 PM   Gardiner Sleeper, M.D., Ph.D. Diseases & Surgery of the Retina and Vitreous [facility]   I have reviewed the above documentation for accuracy and completeness, and I agree with the above. Gardiner Sleeper, M.D., Ph.D. 09/29/19 3:48 PM      Abbreviations: M myopia (nearsighted); A astigmatism; H hyperopia (farsighted); P presbyopia; Mrx spectacle prescription;  CTL contact lenses; OD right eye; OS left eye; OU both eyes  XT exotropia; ET esotropia; PEK punctate epithelial keratitis; PEE punctate epithelial erosions; DES dry eye syndrome; MGD meibomian gland dysfunction; ATs artificial tears; PFAT's preservative free artificial tears; [place] nuclear sclerotic cataract; PSC posterior subcapsular cataract; ERM epi-retinal membrane; PVD posterior vitreous detachment; RD retinal detachment; DM diabetes mellitus; DR diabetic retinopathy; NPDR non-proliferative diabetic retinopathy; PDR proliferative diabetic retinopathy; CSME clinically significant macular edema; DME diabetic macular edema; dbh dot blot hemorrhages; CWS cotton wool spot; POAG primary open angle glaucoma; C/D cup-to-disc ratio; HVF humphrey visual field; GVF goldmann visual field; OCT optical coherence tomography; IOP intraocular pressure; BRVO Branch retinal vein occlusion; CRVO central retinal vein occlusion; CRAO central retinal artery occlusion; BRAO branch retinal artery occlusion; RT retinal tear; SB scleral buckle; PPV pars plana vitrectomy; VH Vitreous hemorrhage; PRP panretinal laser photocoagulation; IVK intravitreal kenalog; VMT vitreomacular traction; MH Macular hole;  NVD neovascularization of the disc; NVE neovascularization elsewhere; AREDS age related eye disease study; ARMD age related macular degeneration;  POAG primary open angle glaucoma; EBMD epithelial/anterior basement membrane dystrophy; ACIOL anterior chamber intraocular lens; IOL intraocular lens; PCIOL posterior chamber intraocular lens; Phaco/IOL phacoemulsification with intraocular lens placement; [place] photorefractive keratectomy; LASIK laser assisted in situ keratomileusis; HTN hypertension; DM diabetes mellitus; COPD chronic obstructive pulmonary disease

## 2019-09-25 ENCOUNTER — Ambulatory Visit (INDEPENDENT_AMBULATORY_CARE_PROVIDER_SITE_OTHER): Payer: Medicare Other | Admitting: Ophthalmology

## 2019-09-25 ENCOUNTER — Encounter (INDEPENDENT_AMBULATORY_CARE_PROVIDER_SITE_OTHER): Payer: Self-pay | Admitting: Ophthalmology

## 2019-09-25 ENCOUNTER — Other Ambulatory Visit: Payer: Self-pay

## 2019-09-25 DIAGNOSIS — H3342 Traction detachment of retina, left eye: Secondary | ICD-10-CM

## 2019-09-25 DIAGNOSIS — H40052 Ocular hypertension, left eye: Secondary | ICD-10-CM | POA: Diagnosis not present

## 2019-09-25 DIAGNOSIS — H3322 Serous retinal detachment, left eye: Secondary | ICD-10-CM | POA: Diagnosis not present

## 2019-09-25 DIAGNOSIS — H3581 Retinal edema: Secondary | ICD-10-CM

## 2019-09-25 DIAGNOSIS — H15092 Other scleritis, left eye: Secondary | ICD-10-CM

## 2019-09-25 DIAGNOSIS — Z961 Presence of intraocular lens: Secondary | ICD-10-CM

## 2019-09-25 DIAGNOSIS — Z8669 Personal history of other diseases of the nervous system and sense organs: Secondary | ICD-10-CM

## 2019-09-25 DIAGNOSIS — H35352 Cystoid macular degeneration, left eye: Secondary | ICD-10-CM

## 2019-09-26 ENCOUNTER — Telehealth (INDEPENDENT_AMBULATORY_CARE_PROVIDER_SITE_OTHER): Payer: Self-pay

## 2019-09-29 ENCOUNTER — Encounter (INDEPENDENT_AMBULATORY_CARE_PROVIDER_SITE_OTHER): Payer: Self-pay | Admitting: Ophthalmology

## 2019-10-19 NOTE — Progress Notes (Addendum)
[facility] Note  10/23/2019     CHIEF COMPLAINT Patient presents for Retina Follow Up   HISTORY OF PRESENT ILLNESS: Carla May is a 78 y.o. female who presents to the clinic today for:   HPI    Retina Follow Up    Patient presents with  Other.  In left eye.  This started weeks ago.  Severity is moderate.  Duration of weeks.  Since onset it is gradually worsening.  I, the attending physician,  performed the HPI with the patient and updated documentation appropriately.          Comments    Pt states her vision is worsening OS.  Patient denies eye pain or discomfort and denies any new or worsening floaters or fol.       Last edited by Bernarda Caffey, MD on 10/27/2019  1:28 AM. (History)    pt saw Dr. Helane Rima at [facility] on 12.03.20, he started a very slow oral prednisone taper, pt has a follow up with him once the taper is done   Referring physician: Gwendalyn Ege, [address] [place],  [place] [address]  HISTORICAL INFORMATION:   Selected notes from the MEDICAL RECORD NUMBER Referred by Dr. Len Blalock for concern of retinal hole    CURRENT MEDICATIONS: Current Outpatient Medications (Ophthalmic Drugs)  Medication Sig  . bacitracin-polymyxin b (POLYSPORIN) ophthalmic ointment Place 1 application into the left eye at bedtime as needed.  . carboxymethylcellulose (REFRESH PLUS) 0.5 % SOLN Place 1 drop into both eyes 3 (three) times daily as needed (for dry eyes).   . dorzolamide-timolol (COSOPT) 22.3-6.8 MG/ML ophthalmic solution Place 1 drop into the left eye 2 (two) times daily.  . DUREZOL 0.05 % EMUL Place 1 drop into the left eye 3 (three) times daily.  Marland Kitchen PROLENSA 0.07 % SOLN Place 1 drop into the left eye 4 (four) times daily.   No current facility-administered medications for this visit. (Ophthalmic Drugs)   Current Outpatient Medications (Other)  Medication Sig  . aspirin EC 81 MG tablet Take 81 mg by mouth daily.  Marland Kitchen  ibuprofen (ADVIL) 800 MG tablet Take 800 mg by mouth every 8 (eight) hours as needed.  . Multiple Vitamin (MULTIVITAMIN WITH MINERALS) TABS tablet Take 1 tablet by mouth daily. One-A-Day for Women 50+  . nabumetone (RELAFEN) 500 MG tablet Take 750 mg by mouth 3 (three) times daily.  . Omega-3 Fatty Acids (FISH OIL ULTRA) 1400 MG CAPS Take 1,400 mg by mouth daily.  Marland Kitchen omeprazole (PRILOSEC OTC) 20 MG tablet Take 20 mg by mouth daily.  . predniSONE (DELTASONE) 20 MG tablet Take 3 tablets (60 mg total) by mouth daily.   Current Facility-Administered Medications (Other)  Medication Route  . triamcinolone acetonide (KENALOG-40) injection 4 mg Intravitreal      REVIEW OF SYSTEMS: ROS    Positive for: HENT, Eyes   Negative for: Constitutional, Gastrointestinal, Neurological, Skin, Genitourinary, Musculoskeletal, Endocrine, Cardiovascular, Respiratory, Psychiatric, Allergic/Imm, Heme/Lymph   Last edited by Doneen Poisson on 10/23/2019  9:59 AM. (History)       ALLERGIES Allergies  Allergen Reactions  . Atorvastatin Other (See Comments)    Muscle aches/ memory loss  . Penicillins Itching, Nausea Only and Other (See Comments)    Did it involve swelling of the face/tongue/throat, SOB, or low BP? Unknown Did it involve sudden or severe rash/hives, skin peeling, or any reaction on the inside of your mouth or nose? Itching Did  you need to seek medical attention at a hospital or doctor's office? No When did it last happen? 1970s If all above answers are "NO", may proceed with cephalosporin use.   . Sulfa Antibiotics Nausea Only    PAST MEDICAL HISTORY Past Medical History:  Diagnosis Date  . Anxiety   . Arthritis    back. neck  . GERD (gastroesophageal reflux disease)   . History of palpitations   . HOH (hard of hearing) 06/28/2019  . Hyperlipidemia    diet controlled and fish oil  . MVP (mitral valve prolapse)    never has caused any problems per patient on 06/27/19  . PONV  (postoperative nausea and vomiting)    No problems in 08/2018; however has had previous problems  . Pre-diabetes    diet controlled, no med  . Retinal detachment    OU  . SVD (spontaneous vaginal delivery)    x 2   Past Surgical History:  Procedure Laterality Date  . ABDOMINAL HYSTERECTOMY  1970's  . AIR/FLUID EXCHANGE Left 06/28/2019   Procedure: Air/Fluid Exchange;  Surgeon: Bernarda Caffey, MD;  Location: [place];  Service: Ophthalmology;  Laterality: Left;  . CATARACT EXTRACTION Bilateral    Dr. Talbert Forest  . COLONOSCOPY     polyp  . EYE SURGERY Bilateral    Cat Sx and RD repair  . GAS INSERTION Right 11/26/2014   Procedure: INSERTION OF GAS;  Surgeon: Hayden Pedro, MD;  Location: [place];  Service: Ophthalmology;  Laterality: Right;  C3F8  . GAS/FLUID EXCHANGE Left 08/21/2018   Procedure: GAS/FLUID EXCHANGE;  Surgeon: Bernarda Caffey, MD;  Location: [place];  Service: Ophthalmology;  Laterality: Left;  C3F8  . KNEE ARTHROSCOPY Left   . LASER PHOTO ABLATION Left 06/28/2019   Procedure: Laser Photo Ablation;  Surgeon: Bernarda Caffey, MD;  Location: [place];  Service: Ophthalmology;  Laterality: Left;  . PARS PLANA VITRECTOMY Left 06/28/2019   Procedure: PARS PLANA VITRECTOMY WITH 25 GAUGE;  Surgeon: Bernarda Caffey, MD;  Location: [place];  Service: Ophthalmology;  Laterality: Left;  . PHOTOCOAGULATION WITH LASER Right 11/26/2014   Procedure: PHOTOCOAGULATION WITH LASER;  Surgeon: Hayden Pedro, MD;  Location: [place];  Service: Ophthalmology;  Laterality: Right;  . PHOTOCOAGULATION WITH LASER Left 08/21/2018   Procedure: PHOTOCOAGULATION WITH LASER;  Surgeon: Bernarda Caffey, MD;  Location: [place];  Service: Ophthalmology;  Laterality: Left;  . REPAIR OF COMPLEX TRACTION RETINAL DETACHMENT Left 12/06/2018   Procedure: REPAIR OF COMPLEX TRACTION RETINAL DETACHMENT, 25 gauge vitrectomy, endolaser photocoagulation, memebrane peel, perfluoron injection,  and silicone oil;  Surgeon: Bernarda Caffey, MD;  Location:  [facility];  Service: Ophthalmology;  Laterality: Left;  . RETINAL DETACHMENT SURGERY Bilateral    SBP OD - Dr. Tempie Hoist (1.12.16).  SBP - Dr. Bernarda Caffey (10.7.19)  . SCLERAL BUCKLE Right 11/26/2014   Procedure: SCLERAL BUCKLE RIGHT EYE ;  Surgeon: Hayden Pedro, MD;  Location: [place];  Service: Ophthalmology;  Laterality: Right;  . SCLERAL BUCKLE WITH POSSIBLE 25 GAUGE PARS PLANA VITRECTOMY Left 08/21/2018   Procedure: SCLERAL BUCKLE WITH 25 GAUGE PARS PLANA VITRECTOMY;  Surgeon: Bernarda Caffey, MD;  Location: [place];  Service: Ophthalmology;  Laterality: Left;  . SILICON OIL REMOVAL Left 5/62/5638   Procedure: Silicon Oil Removal;  Surgeon: Bernarda Caffey, MD;  Location: [place];  Service: Ophthalmology;  Laterality: Left;  . TONSILLECTOMY      FAMILY HISTORY Family History  Problem Relation Age of Onset  . Cancer  Mother   . Heart failure Father     SOCIAL HISTORY Social History   Tobacco Use  . Smoking status: Former Smoker    Packs/day: 0.50    Years: 10.00    Pack years: 5.00    Types: Cigarettes    Quit date: 1970    Years since quitting: 50.9  . Smokeless tobacco: Never Used  . Tobacco comment: quit in 1970's  Substance Use Topics  . Alcohol use: No  . Drug use: No         OPHTHALMIC EXAM:  Base Eye Exam    Visual Acuity (Snellen - Linear)      Right Left   Dist [place] 20/60 -1 20/150 -1   Dist ph [place] 20/40 20/70       Tonometry (Tonopen, 10:01 AM)      Right Left   Pressure 15 15       Pupils      Dark Light Shape React APD   Right 4 3 Round Minimal 0   Left 4 3 Round Minimal 0       Visual Fields      Left Right    Full Full       Extraocular Movement      Right Left    Full Full       Neuro/Psych    Oriented x3: Yes   Mood/Affect: Normal       Dilation    Both eyes: 1.0% Mydriacyl, 2.5% Phenylephrine @ 10:01 AM        Slit Lamp and Fundus Exam    Slit Lamp Exam      Right Left   Lids/Lashes Mild Meibomian gland dysfunction,  Telangiectasia, Ptosis Ptosis, Dermatochalasis - upper lid, mild UL Telangiectasia   Conjunctiva/Sclera White and quiet Injection improved; area of mild scleral thickening is 3.5Vx4H trace injection overlying ?salmon colored   Cornea 1-2+ Punctate epithelial erosions, Temporal Well healed cataract wounds, Debris in tear film 2+ inferior Punctate epithelial erosions   Anterior Chamber Deep and quiet Deep, clear   Iris Round and dilated Round and dilated   Lens PC IOL in good position, trace PCO PC IOL in good position, trace Posterior capsular opacification   Vitreous Vitreous syneresis, mild pigment in anterior vitreous, Posterior vitreous detachment; scattered vitreous debris Post vitrectomy, clear, 1-2+pigment       Fundus Exam      Right Left   Disc Pink and Sharp Sharp rim, mild tilt, trace temporal pallor, temporal Peripapillary atrophy   C/D Ratio 0.2 0.3   Macula Blunted foveal reflex, Retinal pigment epithelial mottling, ERM, No heme or edema Flat, good foveal reflex, mild epiretinal membrane, mild Retinal pigment epithelial mottling   Vessels Vascular attenuation Vascular attenuation   Periphery Attached over scleral buckle, good buckle height, good scarring over buckle Retina attached over buckle; good buckle height; good laser surrounding buckle w/ new row posterior to buckle; +fibrosis at 0300 over buckle; retinotomy from 0300-0430 with good laser surrounding          IMAGING AND PROCEDURES  Imaging and Procedures for _0 @  OCT, Retina - OU - Both Eyes       Right Eye Quality was good. Central Foveal Thickness: 282. Progression has been stable. Findings include normal foveal contour, no IRF, no SRF, epiretinal membrane (stable).   Left Eye Quality was good. Central Foveal Thickness: 268. Progression has been stable. Findings include epiretinal membrane, normal foveal contour, no SRF, no IRF.  Notes *Images captured and stored on drive  Diagnosis / Impression:   OD: mild ERM; NFP; no IRF/SRF-stable from prior OS: retina remains attached; NFP; no IRF/SRF; tr ERM  Clinical management:  See below  Abbreviations: NFP - Normal foveal profile. CME - cystoid macular edema. PED - pigment epithelial detachment. IRF - intraretinal fluid. SRF - subretinal fluid. EZ - ellipsoid zone. ERM - epiretinal membrane. ORA - outer retinal atrophy. ORT - outer retinal tubulation. SRHM - subretinal hyper-reflective material                 ASSESSMENT/PLAN:    ICD-10-CM   1. Left retinal detachment  H33.22   2. 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 Retinal edema  H35.81 OCT, Retina - OU - Both Eyes  6. CME (cystoid macular edema), left  H35.352   7. History of retinal detachment  Z86.69   8. Pseudophakia of both eyes  Z96.1     1-3. Retinal detachment, OS - originally: macula-sparing inferior retinal detachment from 3-6 oclock - large HST at 0500 and 2 small tears at 300 within the detached retina - s/pSBP + PPV/PFC/EL/FAX/14% C3F8 OS, 10.07.19 - progressive fibrosis/PVR just posterior to buckle around 0400 with +SRF tracking posterior to buckle -- focal tractional/PVR detachment OS - s/p AST/MH/DQQ/[id number]/HE/1740CX silicon oil OS, 4.48.1856  - now s/p 31S PPV with silicon oil removal (97.02.63)  - retina attachedand in good position -- good buckle height and laser over buckle and around retinotomy - IOP okay at 15 today  - BCVA 78/58  - with silicon oil now out and retina stable -- clear to proceed with MRx  - f/u 3-4 months  4. Nodular scleritis OS  - recurrent episode following the tapering of meds (PO pred and topical PF/Prolensa) - sup temporal quadrant -- persistent  - 3.5x4.0 mm nodule with surrounding inflammation improved --?salmon patch --  area is raised again             - limited lab work to rule out rheumatologic cause--all results negative   CBC, CMP   HIV   HLA Panel   ANA   ANCA   ACE, Lysozyme   RF   ESR, CRP  - pt saw Dr. Helane Rima on 12.3.20, who started a slow taper of po Prednisone and continued topical Prolensa QID OS  5,6. CME OS -- currently resolved  - mild post-op CME  - initially treated w/ PF and ketorolac -- had interval increase in CME  - s/p sub-tenons kenalog (03.13.20)  7. History of retinal detachment OD  - S/P SBP OD 11/26/14 -- Dr. Tempie Hoist  - looks great -- retina in great position  - monitor  8. Pseudophakia OU  - s/p CE/IOL OU ([place])  - doing well  - monitor   Ophthalmic Meds Ordered this visit:  No orders of the defined types were placed in this encounter.      Return for f/u 3-4 months, nodular scleritis OS, DFE, OCT.  There are no Patient Instructions on file for this visit.   Explained the diagnoses, plan, and follow up with the patient and they expressed understanding.  Patient expressed understanding of the importance of proper follow up care.    This document serves as a record of services personally performed by Gardiner Sleeper, MD, PhD. It was created on their  behalf by Estill Bakes, COT an ophthalmic technician. The creation of this record is the provider's dictation and/or activities during the visit.    Electronically signed by: Estill Bakes, COT 10/19/19 @ 1:41 AM   This document serves as a record of services personally performed by Gardiner Sleeper, MD, PhD. It was created on their behalf by Ernest Mallick, OA, an ophthalmic assistant. The creation of this record is the provider's dictation and/or activities during the visit.    Electronically signed by: Ernest Mallick, OA 12.08.2020 1:41 AM  Gardiner Sleeper, M.D., Ph.D. Diseases & Surgery of the Retina and Vitreous [facility]   I have reviewed the above documentation for accuracy  and completeness, and I agree with the above. Gardiner Sleeper, M.D., Ph.D. 10/27/19 1:41 AM   Abbreviations: M myopia (nearsighted); A astigmatism; H hyperopia (farsighted); P presbyopia; Mrx spectacle prescription;  CTL contact lenses; OD right eye; OS left eye; OU both eyes  XT exotropia; ET esotropia; PEK punctate epithelial keratitis; PEE punctate epithelial erosions; DES dry eye syndrome; MGD meibomian gland dysfunction; ATs artificial tears; PFAT's preservative free artificial tears; [place] nuclear sclerotic cataract; PSC posterior subcapsular cataract; ERM epi-retinal membrane; PVD posterior vitreous detachment; RD retinal detachment; DM diabetes mellitus; DR diabetic retinopathy; NPDR non-proliferative diabetic retinopathy; PDR proliferative diabetic retinopathy; CSME clinically significant macular edema; DME diabetic macular edema; dbh dot blot hemorrhages; CWS cotton wool spot; POAG primary open angle glaucoma; C/D cup-to-disc ratio; HVF humphrey visual field; GVF goldmann visual field; OCT optical coherence tomography; IOP intraocular pressure; BRVO Branch retinal vein occlusion; CRVO central retinal vein occlusion; CRAO central retinal artery occlusion; BRAO branch retinal artery occlusion; RT retinal tear; SB scleral buckle; PPV pars plana vitrectomy; VH Vitreous hemorrhage; PRP panretinal laser photocoagulation; IVK intravitreal kenalog; VMT vitreomacular traction; MH Macular hole;  NVD neovascularization of the disc; NVE neovascularization elsewhere; AREDS age related eye disease study; ARMD age related macular degeneration; POAG primary open angle glaucoma; EBMD epithelial/anterior basement membrane dystrophy; ACIOL anterior chamber intraocular lens; IOL intraocular lens; PCIOL posterior chamber intraocular lens; Phaco/IOL phacoemulsification with intraocular lens placement; [place] photorefractive keratectomy; LASIK laser assisted in situ keratomileusis; HTN hypertension; DM diabetes mellitus; COPD  chronic obstructive pulmonary disease

## 2019-10-23 ENCOUNTER — Ambulatory Visit (INDEPENDENT_AMBULATORY_CARE_PROVIDER_SITE_OTHER): Payer: Medicare Other | Admitting: Ophthalmology

## 2019-10-23 ENCOUNTER — Other Ambulatory Visit: Payer: Self-pay

## 2019-10-23 DIAGNOSIS — H15092 Other scleritis, left eye: Secondary | ICD-10-CM

## 2019-10-23 DIAGNOSIS — H3342 Traction detachment of retina, left eye: Secondary | ICD-10-CM

## 2019-10-23 DIAGNOSIS — H40052 Ocular hypertension, left eye: Secondary | ICD-10-CM

## 2019-10-23 DIAGNOSIS — H3581 Retinal edema: Secondary | ICD-10-CM | POA: Diagnosis not present

## 2019-10-23 DIAGNOSIS — H3322 Serous retinal detachment, left eye: Secondary | ICD-10-CM | POA: Diagnosis not present

## 2019-10-23 DIAGNOSIS — Z8669 Personal history of other diseases of the nervous system and sense organs: Secondary | ICD-10-CM

## 2019-10-23 DIAGNOSIS — H35352 Cystoid macular degeneration, left eye: Secondary | ICD-10-CM

## 2019-10-23 DIAGNOSIS — Z961 Presence of intraocular lens: Secondary | ICD-10-CM

## 2019-10-27 ENCOUNTER — Encounter (INDEPENDENT_AMBULATORY_CARE_PROVIDER_SITE_OTHER): Payer: Self-pay | Admitting: Ophthalmology

## 2020-01-05 ENCOUNTER — Ambulatory Visit: Payer: Medicare Other | Attending: Internal Medicine

## 2020-01-05 DIAGNOSIS — Z23 Encounter for immunization: Secondary | ICD-10-CM | POA: Insufficient documentation

## 2020-01-05 NOTE — Progress Notes (Signed)
   Covid-19 Vaccination Clinic  Name:  KOYA HUNGER    MRN: [id number] DOB: 19-Jan-1941  01/05/2020  Ms. Couse was observed post Covid-19 immunization for 15 minutes without incidence. She was provided with Vaccine Information Sheet and instruction to access the V-Safe system.   Ms. Marksberry was instructed to call 911 with any severe reactions post vaccine: Marland Kitchen Difficulty breathing  . Swelling of your face and throat  . A fast heartbeat  . A bad rash all over your body  . Dizziness and weakness    Immunizations Administered    Name Date Dose VIS Date Route   Pfizer COVID-19 Vaccine 01/05/2020 10:15 AM 0.3 mL 10/26/2019 Intramuscular   Manufacturer: [employer], [employer]   Lot: [id number]   NDC: [address]-3

## 2020-01-29 ENCOUNTER — Ambulatory Visit
Admission: RE | Admit: 2020-01-29 | Discharge: 2020-01-29 | Disposition: A | Discharge status: Home / Self Care | Payer: Medicare Other | Source: Ambulatory Visit | Attending: Family Medicine | Admitting: Family Medicine

## 2020-01-29 ENCOUNTER — Ambulatory Visit: Payer: Medicare Other | Attending: Internal Medicine

## 2020-01-29 ENCOUNTER — Other Ambulatory Visit: Payer: Self-pay | Admitting: Family Medicine

## 2020-01-29 DIAGNOSIS — M25532 Pain in left wrist: Secondary | ICD-10-CM

## 2020-01-29 DIAGNOSIS — M5432 Sciatica, left side: Secondary | ICD-10-CM

## 2020-01-29 DIAGNOSIS — Z23 Encounter for immunization: Secondary | ICD-10-CM

## 2020-01-29 NOTE — Progress Notes (Signed)
   Covid-19 Vaccination Clinic  Name:  Carla May    MRN: [id number] DOB: [birth year]  01/29/2020  Carla May was observed post Covid-19 immunization for 15 minutes without incident. She was provided with Vaccine Information Sheet and instruction to access the V-Safe system.   Carla May was instructed to call 911 with any severe reactions post vaccine: Marland Kitchen Difficulty breathing  . Swelling of face and throat  . A fast heartbeat  . A bad rash all over body  . Dizziness and weakness   Immunizations Administered    Name Date Dose VIS Date Route   Pfizer COVID-19 Vaccine 01/29/2020 12:49 PM 0.3 mL 10/26/2019 Intramuscular   Manufacturer: [employer], [employer]   Lot: [id number]   NDC: [address]-6

## 2020-02-07 ENCOUNTER — Other Ambulatory Visit (INDEPENDENT_AMBULATORY_CARE_PROVIDER_SITE_OTHER): Payer: Self-pay | Admitting: Ophthalmology

## 2020-02-18 NOTE — Progress Notes (Signed)
[facility] Note  02/19/2020     CHIEF COMPLAINT Patient presents for Retina Follow Up   HISTORY OF PRESENT ILLNESS: Carla May is a 79 y.o. female who presents to the clinic today for:   HPI    Retina Follow Up    Patient presents with  Other.  In left eye.  This started 4 months ago.  Severity is moderate.  I, the attending physician,  performed the HPI with the patient and updated documentation appropriately.          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 Patient states vision about the same, adjusting. Has new glasses. No eye pain. Just DX with Diabetes. AIC 7.0.       Last edited by Bernarda Caffey, MD on 02/19/2020 10:56 AM. (History)    pt saw Dr. Helane Rima at [facility] who began tapering po pred and Prolensa (last appt 01.28.21), pt sees him again later this month, pt states she was dx with diabtes, but is not on any medication yet, she states she has tapered po pred, but is still not Prolensa bid, she states her reading vision is "terrible", she got new glasses from Dr. Len Blalock, but doesn't feel like they have helped too much, she states she uses a magnifying glass for reading, she states she thinks the nodule is gone  Referring physician: Kelton Pillar, MD [address] [place],  [place] [address]  HISTORICAL INFORMATION:   Selected notes from the MEDICAL RECORD NUMBER Referred by Dr. Len Blalock for concern of retinal hole    CURRENT MEDICATIONS: Current Outpatient Medications (Ophthalmic Drugs)  Medication Sig  . bacitracin-polymyxin b (POLYSPORIN) ophthalmic ointment Place 1 application into the left eye at bedtime as needed.  . carboxymethylcellulose (REFRESH PLUS) 0.5 % SOLN Place 1 drop into both eyes 3 (three) times daily as needed (for dry eyes).   . dorzolamide-timolol (COSOPT) 22.3-6.8 MG/ML ophthalmic solution Place 1 drop into the left eye 2 (two) times daily.  .  DUREZOL 0.05 % EMUL Place 1 drop into the left eye 3 (three) times daily.  Marland Kitchen PROLENSA 0.07 % SOLN INSTILL 1 DROP INTO LEFT EYE 4 TIMES A DAY   No current facility-administered medications for this visit. (Ophthalmic Drugs)   Current Outpatient Medications (Other)  Medication Sig  . aspirin EC 81 MG tablet Take 81 mg by mouth daily.  Marland Kitchen ibuprofen (ADVIL) 800 MG tablet Take 800 mg by mouth every 8 (eight) hours as needed.  . Multiple Vitamin (MULTIVITAMIN WITH MINERALS) TABS tablet Take 1 tablet by mouth daily. One-A-Day for Women 50+  . nabumetone (RELAFEN) 500 MG tablet Take 750 mg by mouth 3 (three) times daily.  . Omega-3 Fatty Acids (FISH OIL ULTRA) 1400 MG CAPS Take 1,400 mg by mouth daily.  Marland Kitchen omeprazole (PRILOSEC OTC) 20 MG tablet Take 20 mg by mouth daily.  . predniSONE (DELTASONE) 20 MG tablet Take 3 tablets (60 mg total) by mouth daily.   Current Facility-Administered Medications (Other)  Medication Route  . triamcinolone acetonide (KENALOG-40) injection 4 mg Intravitreal      REVIEW OF SYSTEMS: ROS    Positive for: Gastrointestinal, Musculoskeletal, Endocrine, Cardiovascular, Eyes   Negative for: Constitutional, Neurological, Skin, Genitourinary, HENT, Respiratory, Psychiatric, Allergic/Imm, Heme/Lymph   Last edited by Theodore Demark, COA on 02/19/2020  9:53 AM. (History)       ALLERGIES Allergies  Allergen  Reactions  . Atorvastatin Other (See Comments)    Muscle aches/ memory loss  . Penicillins Itching, Nausea Only and Other (See Comments)    Did it involve swelling of the face/tongue/throat, SOB, or low BP? Unknown Did it involve sudden or severe rash/hives, skin peeling, or any reaction on the inside of your mouth or nose? Itching Did you need to seek medical attention at a hospital or doctor's office? No When did it last happen? 1970s If all above answers are "NO", may proceed with cephalosporin use.   . Sulfa Antibiotics Nausea Only    PAST MEDICAL  HISTORY Past Medical History:  Diagnosis Date  . Anxiety   . Arthritis    back. neck  . GERD (gastroesophageal reflux disease)   . History of palpitations   . HOH (hard of hearing) 06/28/2019  . Hyperlipidemia    diet controlled and fish oil  . MVP (mitral valve prolapse)    never has caused any problems per patient on 06/27/19  . PONV (postoperative nausea and vomiting)    No problems in 08/2018; however has had previous problems  . Pre-diabetes    diet controlled, no med  . Retinal detachment    OU  . SVD (spontaneous vaginal delivery)    x 2   Past Surgical History:  Procedure Laterality Date  . ABDOMINAL HYSTERECTOMY  1970's  . AIR/FLUID EXCHANGE Left 06/28/2019   Procedure: Air/Fluid Exchange;  Surgeon: Bernarda Caffey, MD;  Location: [place];  Service: Ophthalmology;  Laterality: Left;  . CATARACT EXTRACTION Bilateral    Dr. Talbert Forest  . COLONOSCOPY     polyp  . EYE SURGERY Bilateral    Cat Sx and RD repair  . GAS INSERTION Right 11/26/2014   Procedure: INSERTION OF GAS;  Surgeon: Hayden Pedro, MD;  Location: [place];  Service: Ophthalmology;  Laterality: Right;  C3F8  . GAS/FLUID EXCHANGE Left 08/21/2018   Procedure: GAS/FLUID EXCHANGE;  Surgeon: Bernarda Caffey, MD;  Location: [place];  Service: Ophthalmology;  Laterality: Left;  C3F8  . KNEE ARTHROSCOPY Left   . LASER PHOTO ABLATION Left 06/28/2019   Procedure: Laser Photo Ablation;  Surgeon: Bernarda Caffey, MD;  Location: [place];  Service: Ophthalmology;  Laterality: Left;  . PARS PLANA VITRECTOMY Left 06/28/2019   Procedure: PARS PLANA VITRECTOMY WITH 25 GAUGE;  Surgeon: Bernarda Caffey, MD;  Location: [place];  Service: Ophthalmology;  Laterality: Left;  . PHOTOCOAGULATION WITH LASER Right 11/26/2014   Procedure: PHOTOCOAGULATION WITH LASER;  Surgeon: Hayden Pedro, MD;  Location: [place];  Service: Ophthalmology;  Laterality: Right;  . PHOTOCOAGULATION WITH LASER Left 08/21/2018   Procedure: PHOTOCOAGULATION WITH LASER;  Surgeon:  Bernarda Caffey, MD;  Location: [place];  Service: Ophthalmology;  Laterality: Left;  . REPAIR OF COMPLEX TRACTION RETINAL DETACHMENT Left 12/06/2018   Procedure: REPAIR OF COMPLEX TRACTION RETINAL DETACHMENT, 25 gauge vitrectomy, endolaser photocoagulation, memebrane peel, perfluoron injection,  and silicone oil;  Surgeon: Bernarda Caffey, MD;  Location: [place];  Service: Ophthalmology;  Laterality: Left;  . RETINAL DETACHMENT SURGERY Bilateral    SBP OD - Dr. Tempie Hoist (1.12.16).  SBP - Dr. Bernarda Caffey (10.7.19)  . SCLERAL BUCKLE Right 11/26/2014   Procedure: SCLERAL BUCKLE RIGHT EYE ;  Surgeon: Hayden Pedro, MD;  Location: [place];  Service: Ophthalmology;  Laterality: Right;  . SCLERAL BUCKLE WITH POSSIBLE 25 GAUGE PARS PLANA VITRECTOMY Left 08/21/2018   Procedure: SCLERAL BUCKLE WITH 25 GAUGE PARS PLANA VITRECTOMY;  Surgeon: Coralyn Pear,  Aaron Edelman, MD;  Location: [place];  Service: Ophthalmology;  Laterality: Left;  . SILICON OIL REMOVAL Left 9/62/8366   Procedure: Silicon Oil Removal;  Surgeon: Bernarda Caffey, MD;  Location: [place];  Service: Ophthalmology;  Laterality: Left;  . TONSILLECTOMY      FAMILY HISTORY Family History  Problem Relation Age of Onset  . Cancer Mother   . Heart failure Father     SOCIAL HISTORY Social History   Tobacco Use  . Smoking status: Former Smoker    Packs/day: 0.50    Years: 10.00    Pack years: 5.00    Types: Cigarettes    Quit date: 1970    Years since quitting: 51.3  . Smokeless tobacco: Never Used  . Tobacco comment: quit in 1970's  Substance Use Topics  . Alcohol use: No  . Drug use: No         OPHTHALMIC EXAM:  Base Eye Exam    Visual Acuity (Snellen - Linear)      Right Left   Dist cc 20/40 -2 20/80 -1   Dist ph cc 20/40 +1 20/70   Correction: Glasses       Tonometry (Tonopen, 9:48 AM)      Right Left   Pressure 11 15       Pupils      Dark Light Shape React APD   Right 4 3 Round Brisk None   Left 4 3 Round Brisk None        Visual Fields (Counting fingers)      Left Right    Full Full       Extraocular Movement      Right Left    Full Full       Neuro/Psych    Oriented x3: Yes   Mood/Affect: Normal       Dilation    Both eyes: 1.0% Mydriacyl, 2.5% Phenylephrine @ 9:47 AM        Slit Lamp and Fundus Exam    Slit Lamp Exam      Right Left   Lids/Lashes Mild Meibomian gland dysfunction, Telangiectasia, Ptosis Ptosis, Dermatochalasis - upper lid, mild UL Telangiectasia   Conjunctiva/Sclera White and quiet Injection improved; area of mild scleral thickening is 3.5Vx4H trace injection overlying ?salmon colored   Cornea 1+ Punctate epithelial erosions, Temporal Well healed cataract wounds, Debris in tear film 2+ inferior Punctate epithelial erosions, Debris in tear film   Anterior Chamber Deep and quiet Deep, clear   Iris Round and dilated Round and dilated   Lens PC IOL in good position, trace PCO PC IOL in good position, trace Posterior capsular opacification   Vitreous Vitreous syneresis, mild pigment in anterior vitreous, Posterior vitreous detachment; scattered vitreous debris Post vitrectomy, clear, 1-2+pigment       Fundus Exam      Right Left   Disc Pink and Sharp Sharp rim, mild tilt, trace temporal pallor, temporal Peripapillary atrophy, Compact   C/D Ratio 0.2 0.3   Macula Blunted foveal reflex, Retinal pigment epithelial mottling, ERM, No heme or edema Flat, good foveal reflex, +epiretinal membrane, mild Retinal pigment epithelial mottling, +Cystic changes nasal macula   Vessels Vascular attenuation Vascular attenuation   Periphery Attached over scleral buckle, good buckle height, good scarring over buckle Retina attached over buckle; good buckle height; good laser surrounding buckle w/ new row posterior to buckle; +fibrosis at 0300 over buckle; retinotomy from 0300-0430 with good laser surrounding        Refraction  Wearing Rx      Sphere Cylinder Axis   Right -1.50 +0.75 017   Left  -2.00 Sphere   Distance only          IMAGING AND PROCEDURES  Imaging and Procedures for '@TODAY' @  OCT, Retina - OU - Both Eyes       Right Eye Quality was good. Central Foveal Thickness: 289. Progression has been stable. Findings include normal foveal contour, no IRF, no SRF, epiretinal membrane (stable).   Left Eye Quality was good. Central Foveal Thickness: 297. Progression has worsened. Findings include epiretinal membrane, normal foveal contour, no SRF, intraretinal fluid (Interval development of focal IRF nasal macula).   Notes *Images captured and stored on drive  Diagnosis / Impression:  OD: mild ERM; NFP; no IRF/SRF-stable from prior OS: Interval development of focal IRF nasal macula ; tr ERM  Clinical management:  See below  Abbreviations: NFP - Normal foveal profile. CME - cystoid macular edema. PED - pigment epithelial detachment. IRF - intraretinal fluid. SRF - subretinal fluid. EZ - ellipsoid zone. ERM - epiretinal membrane. ORA - outer retinal atrophy. ORT - outer retinal tubulation. SRHM - subretinal hyper-reflective material                 ASSESSMENT/PLAN:    ICD-10-CM   1. Left retinal detachment  H33.22   2. 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 Retinal edema  H35.81 OCT, Retina - OU - Both Eyes  6. CME (cystoid macular edema), left  H35.352   7. History of retinal detachment  Z86.69   8. Pseudophakia of both eyes  Z96.1     1-3. Retinal detachment, OS - originally: macula-sparing inferior retinal detachment from 3-6 oclock - large HST at 0500 and 2 small tears at 300 within the detached retina - s/pSBP + PPV/PFC/EL/FAX/14% C3F8 OS, 10.07.19 - progressive fibrosis/PVR just posterior to buckle around 0400 with +SRF tracking posterior to buckle -- focal tractional/PVR detachment  OS - s/p IWL/NL/GXQ/[id number]/KG/8185UD silicon oil OS, 1.49.7026  - now s/p 37C PPV with silicon oil removal (58.85.02)  - retina attachedand in good position -- good buckle height and laser over buckle and around retinotomy - IOP okay at 15 today  - BCVA stable at 20/70  - f/u 2-3 months  4. Nodular scleritis OS  - recurrent episode following the tapering of meds (PO pred and topical PF/Prolensa) - sup temporal quadrant -- persistent  - 3.5x4.0 mm nodule with surrounding inflammation improved --?salmon patch -- area is raised again             - limited lab work to rule out rheumatologic cause--all results negative   CBC, CMP   HIV   HLA Panel   ANA   ANCA   ACE, Lysozyme   RF   ESR, CRP  - pt saw Dr. Helane Rima on 01.28.21, who started a slow taper of po Prednisone (pt is completely off po pred x 2 wks) and continued topical Prolensa BID OS  5,6. CME OS -- mild recurrence  - mild post-op CME  - initially treated w/ PF and ketorolac -- had interval increase in CME  - s/p sub-tenons kenalog (03.13.20)  - OCT today shows mild IRF/cystic changes OS  - BCVA stable at 20/70  - monitor  7. History of retinal detachment OD  - S/P SBP OD  11/26/14 -- Dr. Tempie Hoist  - looks great -- retina in great position  - monitor  8. Pseudophakia OU  - s/p CE/IOL OU ([place])  - doing well  - monitor   Ophthalmic Meds Ordered this visit:  No orders of the defined types were placed in this encounter.      Return for f/u 2-3 months, RD / nodular scleritis OS, DFE, OCT.  There are no Patient Instructions on file for this visit.   Explained the diagnoses, plan, and follow up with the patient and they expressed understanding.  Patient expressed understanding of the importance of proper follow up care.   This document serves as a record of services personally performed by Gardiner Sleeper, MD, PhD. It was created on their behalf by Estill Bakes, COT an ophthalmic technician. The creation of this record is the provider's dictation and/or activities during the visit.    Electronically signed by: Estill Bakes, COT 02/18/20 @ 9:30 PM   This document serves as a record of services personally performed by Gardiner Sleeper, MD, PhD. It was created on their behalf by Ernest Mallick, OA, an ophthalmic assistant. The creation of this record is the provider's dictation and/or activities during the visit.    Electronically signed by: Ernest Mallick, OA 04.06.2021 9:30 PM  Gardiner Sleeper, M.D., Ph.D. Diseases & Surgery of the Retina and [address] 02/19/2020   I have reviewed the above documentation for accuracy and completeness, and I agree with the above. Gardiner Sleeper, M.D., Ph.D. 02/23/20 9:35 PM   Abbreviations: M myopia (nearsighted); A astigmatism; H hyperopia (farsighted); P presbyopia; Mrx spectacle prescription;  CTL contact lenses; OD right eye; OS left eye; OU both eyes  XT exotropia; ET esotropia; PEK punctate epithelial keratitis; PEE punctate epithelial erosions; DES dry eye syndrome; MGD meibomian gland dysfunction; ATs artificial tears; PFAT's preservative free artificial tears; [employer] nuclear sclerotic cataract; PSC posterior subcapsular cataract; ERM epi-retinal membrane; PVD posterior vitreous detachment; RD retinal detachment; DM diabetes mellitus; DR diabetic retinopathy; NPDR non-proliferative diabetic retinopathy; PDR proliferative diabetic retinopathy; CSME clinically significant macular edema; DME diabetic macular edema; dbh dot blot hemorrhages; CWS cotton wool spot; POAG primary open angle glaucoma; C/D cup-to-disc ratio; HVF humphrey visual field; GVF goldmann visual field; OCT optical coherence tomography; IOP intraocular pressure; BRVO Branch retinal vein occlusion; CRVO central retinal vein occlusion; CRAO central retinal artery occlusion; BRAO branch retinal artery occlusion; RT retinal  tear; SB scleral buckle; PPV pars plana vitrectomy; VH Vitreous hemorrhage; PRP panretinal laser photocoagulation; IVK intravitreal kenalog; VMT vitreomacular traction; MH Macular hole;  NVD neovascularization of the disc; NVE neovascularization elsewhere; AREDS age related eye disease study; ARMD age related macular degeneration; POAG primary open angle glaucoma; EBMD epithelial/anterior basement membrane dystrophy; ACIOL anterior chamber intraocular lens; IOL intraocular lens; PCIOL posterior chamber intraocular lens; Phaco/IOL phacoemulsification with intraocular lens placement; [place] photorefractive keratectomy; LASIK laser assisted in situ keratomileusis; HTN hypertension; DM diabetes mellitus; COPD chronic obstructive pulmonary disease

## 2020-02-19 ENCOUNTER — Ambulatory Visit (INDEPENDENT_AMBULATORY_CARE_PROVIDER_SITE_OTHER): Payer: Medicare Other | Admitting: Ophthalmology

## 2020-02-19 ENCOUNTER — Encounter (INDEPENDENT_AMBULATORY_CARE_PROVIDER_SITE_OTHER): Payer: Self-pay | Admitting: Ophthalmology

## 2020-02-19 ENCOUNTER — Other Ambulatory Visit: Payer: Self-pay

## 2020-02-19 ENCOUNTER — Other Ambulatory Visit: Payer: Self-pay | Admitting: Family Medicine

## 2020-02-19 DIAGNOSIS — H15092 Other scleritis, left eye: Secondary | ICD-10-CM

## 2020-02-19 DIAGNOSIS — M5430 Sciatica, unspecified side: Secondary | ICD-10-CM

## 2020-02-19 DIAGNOSIS — H3581 Retinal edema: Secondary | ICD-10-CM

## 2020-02-19 DIAGNOSIS — H3322 Serous retinal detachment, left eye: Secondary | ICD-10-CM | POA: Diagnosis not present

## 2020-02-19 DIAGNOSIS — Z961 Presence of intraocular lens: Secondary | ICD-10-CM

## 2020-02-19 DIAGNOSIS — H3342 Traction detachment of retina, left eye: Secondary | ICD-10-CM

## 2020-02-19 DIAGNOSIS — H40052 Ocular hypertension, left eye: Secondary | ICD-10-CM | POA: Diagnosis not present

## 2020-02-19 DIAGNOSIS — H35352 Cystoid macular degeneration, left eye: Secondary | ICD-10-CM

## 2020-02-19 DIAGNOSIS — Z8669 Personal history of other diseases of the nervous system and sense organs: Secondary | ICD-10-CM

## 2020-02-26 ENCOUNTER — Ambulatory Visit
Admission: RE | Admit: 2020-02-26 | Discharge: 2020-02-26 | Disposition: A | Discharge status: Home / Self Care | Payer: Medicare Other | Source: Ambulatory Visit | Attending: Family Medicine | Admitting: Family Medicine

## 2020-02-26 ENCOUNTER — Other Ambulatory Visit: Payer: Self-pay

## 2020-02-26 DIAGNOSIS — M5430 Sciatica, unspecified side: Secondary | ICD-10-CM

## 2020-03-11 ENCOUNTER — Encounter: Payer: Medicare Other | Admitting: Registered"

## 2020-04-04 ENCOUNTER — Ambulatory Visit: Payer: Medicare Other | Admitting: Dietician

## 2020-04-08 ENCOUNTER — Encounter: Payer: Medicare Other | Attending: Family Medicine | Admitting: Dietician

## 2020-04-08 ENCOUNTER — Encounter: Payer: Self-pay | Admitting: Dietician

## 2020-04-08 ENCOUNTER — Other Ambulatory Visit: Payer: Self-pay

## 2020-04-08 DIAGNOSIS — E119 Type 2 diabetes mellitus without complications: Secondary | ICD-10-CM | POA: Insufficient documentation

## 2020-04-08 NOTE — Progress Notes (Signed)
Patient was seen on 04/08/2020 for the first of a series of three diabetes self-management courses at [facility]. Patient had to leave at 10:45 for an appointment.  Will review information at her next visit.  Patient Education Plan per assessed needs and concerns is to attend three course education program for Diabetes Self Management Education.  The following learning objectives were met by the patient during this class:  Describe diabetes, types of diabetes and pathophysiology  State some common risk factors for diabetes  Defines the role of glucose and insulin  Describe the relationship between diabetes and cardiovascular and other risks  State the members of the Healthcare Team  States the rationale for glucose monitoring and when to test  State their individual [place] the importance of logging glucose readings and how to interpret the readings  Identifies A1C target  Explain the correlation between A1c and eAG values  State symptoms and treatment of high blood glucose and low blood glucose  Explain proper technique for glucose testing and identify proper sharps disposal  Handouts given during class include:  How to Thrive:  A Guide for Your Journey with Diabetes by the ADA  Meal Plan Card and carbohydrate content list  Dietary intake form  Low Sodium Flavoring Tips  Types of Fats  Dining Out  Label reading  Snack list  Planning a balanced meal  The diabetes portion plate  Diabetes Resources  A1c to eAG Conversion Chart  Blood Glucose Log  Diabetes Recommended Care Schedule  Support Group  Diabetes Success Plan  Core Class Satisfaction Survey   Follow-Up Plan:  Attend core 2

## 2020-04-15 ENCOUNTER — Ambulatory Visit: Payer: Medicare Other

## 2020-04-22 ENCOUNTER — Ambulatory Visit: Payer: Medicare Other

## 2020-05-05 NOTE — Progress Notes (Signed)
[facility] Note  05/06/2020     CHIEF COMPLAINT Patient presents for Retina Follow Up   HISTORY OF PRESENT ILLNESS: Carla May is a 78 y.o. female who presents to the clinic today for:   HPI    Retina Follow Up    Patient presents with  Retinal Break/Detachment.  In left eye.  This started weeks ago.  Severity is moderate.  Duration of weeks.  Since onset it is stable.  I, the attending physician,  performed the HPI with the patient and updated documentation appropriately.          Comments    Pt states her vision is about the same OU.  Pt denies eye pain or discomfort and denies any new or worsening floaters or fol OU.       Last edited by Bernarda Caffey, MD on 05/09/2020 10:41 AM. (History)    pt saw Dr. Helane Rima at [facility] who began tapering po pred and Prolensa (last appt 01.28.21), pt sees him again later this month, pt states she was dx with diabtes, but is not on any medication yet, she states she has tapered po pred, but is still not Prolensa bid, she states her reading vision is "terrible", she got new glasses from Dr. Len Blalock, but doesn't feel like they have helped too much, she states she uses a magnifying glass for reading, she states she thinks the nodule is gone  Referring physician: Kelton Pillar, MD [address] [place],  [place] [address]  HISTORICAL INFORMATION:   Selected notes from the MEDICAL RECORD NUMBER Referred by Dr. Len Blalock for concern of retinal hole    CURRENT MEDICATIONS: Current Outpatient Medications (Ophthalmic Drugs)  Medication Sig  . bacitracin-polymyxin b (POLYSPORIN) ophthalmic ointment Place 1 application into the left eye at bedtime as needed.  . carboxymethylcellulose (REFRESH PLUS) 0.5 % SOLN Place 1 drop into both eyes 3 (three) times daily as needed (for dry eyes).   . dorzolamide-timolol (COSOPT) 22.3-6.8 MG/ML ophthalmic solution Place 1 drop into the left eye 2 (two) times  daily.  . DUREZOL 0.05 % EMUL Place 1 drop into the left eye 3 (three) times daily.  Marland Kitchen PROLENSA 0.07 % SOLN INSTILL 1 DROP INTO LEFT EYE 4 TIMES A DAY   No current facility-administered medications for this visit. (Ophthalmic Drugs)   Current Outpatient Medications (Other)  Medication Sig  . acetaminophen (TYLENOL) 650 MG CR tablet Take 650 mg by mouth every 8 (eight) hours as needed for pain.  Marland Kitchen aspirin EC 81 MG tablet Take 81 mg by mouth daily.  Marland Kitchen ibuprofen (ADVIL) 800 MG tablet Take 800 mg by mouth every 8 (eight) hours as needed.  . Multiple Vitamin (MULTIVITAMIN WITH MINERALS) TABS tablet Take 1 tablet by mouth daily. One-A-Day for Women 50+  . nabumetone (RELAFEN) 500 MG tablet Take 750 mg by mouth 3 (three) times daily.  . Omega-3 Fatty Acids (FISH OIL ULTRA) 1400 MG CAPS Take 1,400 mg by mouth daily.  Marland Kitchen omeprazole (PRILOSEC OTC) 20 MG tablet Take 20 mg by mouth daily.  . predniSONE (DELTASONE) 20 MG tablet Take 3 tablets (60 mg total) by mouth daily.   Current Facility-Administered Medications (Other)  Medication Route  . triamcinolone acetonide (KENALOG-40) injection 4 mg Intravitreal      REVIEW OF SYSTEMS: ROS    Positive for: Gastrointestinal, Musculoskeletal, Endocrine, Cardiovascular, Eyes   Negative for: Constitutional, Neurological, Skin, Genitourinary, HENT, Respiratory, Psychiatric, Allergic/Imm,  Heme/Lymph   Last edited by Doneen Poisson on 05/06/2020  9:24 AM. (History)       ALLERGIES Allergies  Allergen Reactions  . Atorvastatin Other (See Comments)    Muscle aches/ memory loss  . Penicillins Itching, Nausea Only and Other (See Comments)    Did it involve swelling of the face/tongue/throat, SOB, or low BP? Unknown Did it involve sudden or severe rash/hives, skin peeling, or any reaction on the inside of your mouth or nose? Itching Did you need to seek medical attention at a hospital or doctor's office? No When did it last happen? 1970s If all above  answers are "NO", may proceed with cephalosporin use.   . Sulfa Antibiotics Nausea Only    PAST MEDICAL HISTORY Past Medical History:  Diagnosis Date  . Anxiety   . Arthritis    back. neck  . Diabetes mellitus without complication ([place])   . GERD (gastroesophageal reflux disease)   . History of palpitations   . HOH (hard of hearing) 06/28/2019  . Hyperlipidemia    diet controlled and fish oil  . MVP (mitral valve prolapse)    never has caused any problems per patient on 06/27/19  . PONV (postoperative nausea and vomiting)    No problems in 08/2018; however has had previous problems  . Pre-diabetes    diet controlled, no med  . Retinal detachment    OU  . SVD (spontaneous vaginal delivery)    x 2   Past Surgical History:  Procedure Laterality Date  . ABDOMINAL HYSTERECTOMY  1970's  . AIR/FLUID EXCHANGE Left 06/28/2019   Procedure: Air/Fluid Exchange;  Surgeon: Bernarda Caffey, MD;  Location: [place];  Service: Ophthalmology;  Laterality: Left;  . CATARACT EXTRACTION Bilateral    Dr. Talbert Forest  . COLONOSCOPY     polyp  . EYE SURGERY Bilateral    Cat Sx and RD repair  . GAS INSERTION Right 11/26/2014   Procedure: INSERTION OF GAS;  Surgeon: Hayden Pedro, MD;  Location: [place];  Service: Ophthalmology;  Laterality: Right;  C3F8  . GAS/FLUID EXCHANGE Left 08/21/2018   Procedure: GAS/FLUID EXCHANGE;  Surgeon: Bernarda Caffey, MD;  Location: [place];  Service: Ophthalmology;  Laterality: Left;  C3F8  . KNEE ARTHROSCOPY Left   . LASER PHOTO ABLATION Left 06/28/2019   Procedure: Laser Photo Ablation;  Surgeon: Bernarda Caffey, MD;  Location: [place];  Service: Ophthalmology;  Laterality: Left;  . PARS PLANA VITRECTOMY Left 06/28/2019   Procedure: PARS PLANA VITRECTOMY WITH 25 GAUGE;  Surgeon: Bernarda Caffey, MD;  Location: [place];  Service: Ophthalmology;  Laterality: Left;  . PHOTOCOAGULATION WITH LASER Right 11/26/2014   Procedure: PHOTOCOAGULATION WITH LASER;  Surgeon: Hayden Pedro, MD;   Location: [facility];  Service: Ophthalmology;  Laterality: Right;  . PHOTOCOAGULATION WITH LASER Left 08/21/2018   Procedure: PHOTOCOAGULATION WITH LASER;  Surgeon: Bernarda Caffey, MD;  Location: [place];  Service: Ophthalmology;  Laterality: Left;  . REPAIR OF COMPLEX TRACTION RETINAL DETACHMENT Left 12/06/2018   Procedure: REPAIR OF COMPLEX TRACTION RETINAL DETACHMENT, 25 gauge vitrectomy, endolaser photocoagulation, memebrane peel, perfluoron injection,  and silicone oil;  Surgeon: Bernarda Caffey, MD;  Location: [place];  Service: Ophthalmology;  Laterality: Left;  . RETINAL DETACHMENT SURGERY Bilateral    SBP OD - Dr. Tempie Hoist (1.12.16).  SBP - Dr. Bernarda Caffey (10.7.19)  . SCLERAL BUCKLE Right 11/26/2014   Procedure: SCLERAL BUCKLE RIGHT EYE ;  Surgeon: Hayden Pedro, MD;  Location: [place];  Service: Ophthalmology;  Laterality: Right;  . SCLERAL BUCKLE WITH POSSIBLE 25 GAUGE PARS PLANA VITRECTOMY Left 08/21/2018   Procedure: SCLERAL BUCKLE WITH 25 GAUGE PARS PLANA VITRECTOMY;  Surgeon: Bernarda Caffey, MD;  Location: [place];  Service: Ophthalmology;  Laterality: Left;  . SILICON OIL REMOVAL Left 0/11/7492   Procedure: Silicon Oil Removal;  Surgeon: Bernarda Caffey, MD;  Location: [place];  Service: Ophthalmology;  Laterality: Left;  . TONSILLECTOMY      FAMILY HISTORY Family History  Problem Relation Age of Onset  . Cancer Mother   . Heart failure Father     SOCIAL HISTORY Social History   Tobacco Use  . Smoking status: Former Smoker    Packs/day: 0.50    Years: 10.00    Pack years: 5.00    Types: Cigarettes    Quit date: 1970    Years since quitting: 51.5  . Smokeless tobacco: Never Used  . Tobacco comment: quit in 1970's  Vaping Use  . Vaping Use: Never used  Substance Use Topics  . Alcohol use: No  . Drug use: No         OPHTHALMIC EXAM:  Base Eye Exam    Visual Acuity (Snellen - Linear)      Right Left   Dist cc 20/40 -1 20/60 +1   Dist ph cc NI 20/50 -1   Correction:  Glasses       Tonometry (Tonopen, 9:31 AM)      Right Left   Pressure 13 14       Pupils      Dark Light Shape React APD   Right 3 2 Round Brisk 0   Left 3 2 Round Brisk 0       Visual Fields      Left Right    Full    Restrictions  Partial outer inferior nasal deficiency       Extraocular Movement      Right Left    Full Full       Neuro/Psych    Oriented x3: Yes   Mood/Affect: Normal       Dilation    Both eyes: 1.0% Mydriacyl, 2.5% Phenylephrine @ 9:31 AM        Slit Lamp and Fundus Exam    Slit Lamp Exam      Right Left   Lids/Lashes Mild Meibomian gland dysfunction, Telangiectasia, Ptosis Ptosis, Dermatochalasis - upper lid, mild UL Telangiectasia   Conjunctiva/Sclera White and quiet Injection and focal nodule resolved; white and quiet   Cornea 1+ Punctate epithelial erosions, Temporal Well healed cataract wounds, Debris in tear film 2+ inferior Punctate epithelial erosions, Debris in tear film   Anterior Chamber Deep and quiet Deep, clear   Iris Round and dilated Round and dilated   Lens PC IOL in good position, trace PCO PC IOL in good position, trace Posterior capsular opacification   Vitreous Vitreous syneresis, mild pigment in anterior vitreous, Posterior vitreous detachment; scattered vitreous debris Post vitrectomy, clear, 1-2+pigment       Fundus Exam      Right Left   Disc Pink and Sharp Sharp rim, mild tilt, trace temporal pallor, temporal Peripapillary atrophy, Compact   C/D Ratio 0.2 0.3   Macula Blunted foveal reflex, Retinal pigment epithelial mottling, ERM, No heme or edema Flat, good foveal reflex, +epiretinal membrane, mild Retinal pigment epithelial mottling, +Cystic changes resolved   Vessels Vascular attenuation mild attenuation   Periphery Attached over scleral buckle, good buckle height,  good scarring over buckle Retina attached over buckle; good buckle height; good laser surrounding buckle w/ new row posterior to buckle; +fibrosis at  0300 over buckle; retinotomy from 0300-0430 with good laser surrounding        Refraction    Wearing Rx      Sphere Cylinder Axis   Right -1.50 +0.75 017   Left -2.00 Sphere           IMAGING AND PROCEDURES  Imaging and Procedures for '@TODAY' @  OCT, Retina - OU - Both Eyes       Right Eye Quality was good. Central Foveal Thickness: 284. Progression has been stable. Findings include normal foveal contour, no IRF, no SRF, epiretinal membrane (stable).   Left Eye Quality was good. Central Foveal Thickness: 281. Progression has improved. Findings include epiretinal membrane, normal foveal contour, no SRF, intraretinal fluid (Interval improvement in IRF/cystic changes.  Peristent ERM.).   Notes *Images captured and stored on drive  Diagnosis / Impression:  OD: mild ERM; NFP; no IRF/SRF-stable from prior OS: Interval improvement in IRF/cystic changes.  Peristent ERM.  Clinical management:  See below  Abbreviations: NFP - Normal foveal profile. CME - cystoid macular edema. PED - pigment epithelial detachment. IRF - intraretinal fluid. SRF - subretinal fluid. EZ - ellipsoid zone. ERM - epiretinal membrane. ORA - outer retinal atrophy. ORT - outer retinal tubulation. SRHM - subretinal hyper-reflective material                 ASSESSMENT/PLAN:    ICD-10-CM   1. Left retinal detachment  H33.22   2. 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 Retinal edema  H35.81 OCT, Retina - OU - Both Eyes  6. CME (cystoid macular edema), left  H35.352   7. History of retinal detachment  Z86.69   8. Pseudophakia of both eyes  Z96.1     1-3. Retinal detachment, OS - originally: macula-sparing inferior retinal detachment from 3-6 oclock - large HST at 0500 and 2 small tears at 300 within the detached retina - s/pSBP + PPV/PFC/EL/FAX/14% C3F8 OS,  10.07.19 - progressive fibrosis/PVR just posterior to buckle around 0400 with +SRF tracking posterior to buckle -- focal tractional/PVR detachment OS - s/p FXT/KW/IOX/[id number]/AS/[id number] silicon oil OS, 2.29.7989  - now s/p 21J PPV with silicon oil removal (94.17.40)  - retina attachedand in good position -- good buckle height and laser over buckle and around retinotomy - IOP okay at 14 today  - BCVA improved to 20/50- today  4. Nodular scleritis OS -- improved  - history of recurrent episodes following the tapering of meds (PO pred and topical PF/Prolensa) - sup temporal quadrant -- now resolved  - was 3.5x4.0 mm nodule with surrounding inflammation -- essentially resolved             - limited lab work to rule out rheumatologic cause--all results negative   CBC, CMP   HIV   HLA Panel   ANA   ANCA   ACE, Lysozyme   RF   ESR, CRP  - following with Dr. Helane Rima -- last visit  on 04.22.21.  Pt currently on PF and Prolensa BID OS             - F/u in 4-6 mos  5,6. CME OS  - mild post-op CME  - s/p sub-tenons kenalog (03.13.20)  - OCT  today shows mild improvement in IRF/cystic changes OS  - BCVA improved to 20/50- today  - monitor  7. History of retinal detachment OD  - S/P SBP OD 11/26/14 -- Dr. Tempie Hoist  - looks great -- retina in great position  - monitor  8. Pseudophakia OU  - s/p CE/IOL OU ([place])  - doing well  - monitor   Ophthalmic Meds Ordered this visit:  No orders of the defined types were placed in this encounter.      Return for 4-6 mo f/u for nodular scleritis OS w/DFE&OCT.  There are no Patient Instructions on file for this visit.   Explained the diagnoses, plan, and follow up with the patient and they expressed understanding.  Patient expressed understanding of the importance of proper follow up care.   This document serves as a record of services personally performed by Gardiner Sleeper, MD, PhD.  It was created on their behalf by Estill Bakes, COT an ophthalmic technician. The creation of this record is the provider's dictation and/or activities during the visit.    Electronically signed by: Estill Bakes, COT 05/05/20 @ 10:48 AM  Gardiner Sleeper, M.D., Ph.D. Diseases & Surgery of the Retina and [place] 05/06/2020   I have reviewed the above documentation for accuracy and completeness, and I agree with the above. Gardiner Sleeper, M.D., Ph.D. 05/09/20 10:48 AM   Abbreviations: M myopia (nearsighted); A astigmatism; H hyperopia (farsighted); P presbyopia; Mrx spectacle prescription;  CTL contact lenses; OD right eye; OS left eye; OU both eyes  XT exotropia; ET esotropia; PEK punctate epithelial keratitis; PEE punctate epithelial erosions; DES dry eye syndrome; MGD meibomian gland dysfunction; ATs artificial tears; PFAT's preservative free artificial tears; [place] nuclear sclerotic cataract; PSC posterior subcapsular cataract; ERM epi-retinal membrane; PVD posterior vitreous detachment; RD retinal detachment; DM diabetes mellitus; DR diabetic retinopathy; NPDR non-proliferative diabetic retinopathy; PDR proliferative diabetic retinopathy; CSME clinically significant macular edema; DME diabetic macular edema; dbh dot blot hemorrhages; CWS cotton wool spot; POAG primary open angle glaucoma; C/D cup-to-disc ratio; HVF humphrey visual field; GVF goldmann visual field; OCT optical coherence tomography; IOP intraocular pressure; BRVO Branch retinal vein occlusion; CRVO central retinal vein occlusion; CRAO central retinal artery occlusion; BRAO branch retinal artery occlusion; RT retinal tear; SB scleral buckle; PPV pars plana vitrectomy; VH Vitreous hemorrhage; PRP panretinal laser photocoagulation; IVK intravitreal kenalog; VMT vitreomacular traction; MH Macular hole;  NVD neovascularization of the disc; NVE neovascularization elsewhere; AREDS age related eye disease  study; ARMD age related macular degeneration; POAG primary open angle glaucoma; EBMD epithelial/anterior basement membrane dystrophy; ACIOL anterior chamber intraocular lens; IOL intraocular lens; PCIOL posterior chamber intraocular lens; Phaco/IOL phacoemulsification with intraocular lens placement; [place] photorefractive keratectomy; LASIK laser assisted in situ keratomileusis; HTN hypertension; DM diabetes mellitus; COPD chronic obstructive pulmonary disease

## 2020-05-06 ENCOUNTER — Other Ambulatory Visit: Payer: Self-pay

## 2020-05-06 ENCOUNTER — Ambulatory Visit (INDEPENDENT_AMBULATORY_CARE_PROVIDER_SITE_OTHER): Payer: Medicare Other | Admitting: Ophthalmology

## 2020-05-06 DIAGNOSIS — H3342 Traction detachment of retina, left eye: Secondary | ICD-10-CM

## 2020-05-06 DIAGNOSIS — H40052 Ocular hypertension, left eye: Secondary | ICD-10-CM

## 2020-05-06 DIAGNOSIS — H3581 Retinal edema: Secondary | ICD-10-CM | POA: Diagnosis not present

## 2020-05-06 DIAGNOSIS — H3322 Serous retinal detachment, left eye: Secondary | ICD-10-CM

## 2020-05-06 DIAGNOSIS — Z8669 Personal history of other diseases of the nervous system and sense organs: Secondary | ICD-10-CM

## 2020-05-06 DIAGNOSIS — Z961 Presence of intraocular lens: Secondary | ICD-10-CM

## 2020-05-06 DIAGNOSIS — H15092 Other scleritis, left eye: Secondary | ICD-10-CM | POA: Diagnosis not present

## 2020-05-06 DIAGNOSIS — H35352 Cystoid macular degeneration, left eye: Secondary | ICD-10-CM

## 2020-05-09 ENCOUNTER — Encounter (INDEPENDENT_AMBULATORY_CARE_PROVIDER_SITE_OTHER): Payer: Self-pay | Admitting: Ophthalmology

## 2020-10-16 NOTE — Progress Notes (Addendum)
[facility] Note  10/21/2020     CHIEF COMPLAINT Patient presents for Retina Follow Up   HISTORY OF PRESENT ILLNESS: Carla May is a 79 y.o. female who presents to the clinic today for:   HPI    Retina Follow Up    Patient presents with  Other.  In left eye.  I, the attending physician,  performed the HPI with the patient and updated documentation appropriately.          Comments    6 month follow up Nodular scleritis OS, hx RD OS- Patient states when reading eyes will become tired and blurry.  They get watery and dry.  Prolensa qd OS, Prednisolone BID OS       Last edited by Bernarda Caffey, MD on 10/21/2020 10:10 AM. (History)    pt saw Dr. Helane Rima in October, she states he changed her drops to PF BID OS and Prolensa QD OS until the bottle runs out, pt states she feels like since she saw him her vision has gone down, she has a hard time reading, pt states her eyes are very dry, pt is not on any oral medication  Referring physician: Kelton Pillar, MD [address] [place],  [place] [id number]  HISTORICAL INFORMATION:   Selected notes from the MEDICAL RECORD NUMBER Referred by Dr. Len Blalock for concern of retinal hole.   CURRENT MEDICATIONS: Current Outpatient Medications (Ophthalmic Drugs)  Medication Sig  . Bromfenac Sodium (PROLENSA) 0.07 % SOLN Place 1 drop into the left eye 4 (four) times daily.  . carboxymethylcellulose (REFRESH PLUS) 0.5 % SOLN Place 1 drop into both eyes 3 (three) times daily as needed (for dry eyes).   . bacitracin-polymyxin b (POLYSPORIN) ophthalmic ointment Place 1 application into the left eye at bedtime as needed. (Patient not taking: Reported on 10/21/2020)  . dorzolamide-timolol (COSOPT) 22.3-6.8 MG/ML ophthalmic solution Place 1 drop into the left eye 2 (two) times daily. (Patient not taking: Reported on 10/21/2020)  . DUREZOL 0.05 % EMUL Place 1 drop into the left eye 3 (three) times daily.  (Patient not taking: Reported on 10/21/2020)   No current facility-administered medications for this visit. (Ophthalmic Drugs)   Current Outpatient Medications (Other)  Medication Sig  . acetaminophen (TYLENOL) 650 MG CR tablet Take 650 mg by mouth every 8 (eight) hours as needed for pain.  Marland Kitchen aspirin EC 81 MG tablet Take 81 mg by mouth daily.  . Multiple Vitamin (MULTIVITAMIN WITH MINERALS) TABS tablet Take 1 tablet by mouth daily. One-A-Day for Women 50+  . Omega-3 Fatty Acids (FISH OIL ULTRA) 1400 MG CAPS Take 1,400 mg by mouth daily.  Marland Kitchen omeprazole (PRILOSEC OTC) 20 MG tablet Take 20 mg by mouth daily.  Marland Kitchen ibuprofen (ADVIL) 800 MG tablet Take 800 mg by mouth every 8 (eight) hours as needed. (Patient not taking: Reported on 10/21/2020)  . nabumetone (RELAFEN) 500 MG tablet Take 750 mg by mouth 3 (three) times daily. (Patient not taking: Reported on 10/21/2020)  . predniSONE (DELTASONE) 20 MG tablet Take 3 tablets (60 mg total) by mouth daily. (Patient not taking: Reported on 10/21/2020)   Current Facility-Administered Medications (Other)  Medication Route  . triamcinolone acetonide (KENALOG-40) injection 4 mg Intravitreal      REVIEW OF SYSTEMS: ROS    Positive for: Gastrointestinal, Musculoskeletal, Endocrine, Cardiovascular, Eyes   Negative for: Constitutional, Neurological, Skin, Genitourinary, HENT, Respiratory, Psychiatric, Allergic/Imm, Heme/Lymph   Last edited by Nyra Capes,  Robin, COA on 10/21/2020  9:39 AM. (History)       ALLERGIES Allergies  Allergen Reactions  . Atorvastatin Other (See Comments)    Muscle aches/ memory loss  . Penicillins Itching, Nausea Only and Other (See Comments)    Did it involve swelling of the face/tongue/throat, SOB, or low BP? Unknown Did it involve sudden or severe rash/hives, skin peeling, or any reaction on the inside of your mouth or nose? Itching Did you need to seek medical attention at a hospital or doctor's office? No When did it last  happen? 1970s If all above answers are "NO", may proceed with cephalosporin use.   . Sulfa Antibiotics Nausea Only    PAST MEDICAL HISTORY Past Medical History:  Diagnosis Date  . Anxiety   . Arthritis    back. neck  . Diabetes mellitus without complication ([place])   . GERD (gastroesophageal reflux disease)   . History of palpitations   . HOH (hard of hearing) 06/28/2019  . Hyperlipidemia    diet controlled and fish oil  . MVP (mitral valve prolapse)    never has caused any problems per patient on 06/27/19  . PONV (postoperative nausea and vomiting)    No problems in 08/2018; however has had previous problems  . Pre-diabetes    diet controlled, no med  . Retinal detachment    OU  . SVD (spontaneous vaginal delivery)    x 2   Past Surgical History:  Procedure Laterality Date  . ABDOMINAL HYSTERECTOMY  1970's  . AIR/FLUID EXCHANGE Left 06/28/2019   Procedure: Air/Fluid Exchange;  Surgeon: Bernarda Caffey, MD;  Location: [place];  Service: Ophthalmology;  Laterality: Left;  . CATARACT EXTRACTION Bilateral    Dr. Talbert Forest  . COLONOSCOPY     polyp  . EYE SURGERY Bilateral    Cat Sx and RD repair  . GAS INSERTION Right 11/26/2014   Procedure: INSERTION OF GAS;  Surgeon: Hayden Pedro, MD;  Location: [place];  Service: Ophthalmology;  Laterality: Right;  C3F8  . GAS/FLUID EXCHANGE Left 08/21/2018   Procedure: GAS/FLUID EXCHANGE;  Surgeon: Bernarda Caffey, MD;  Location: [place];  Service: Ophthalmology;  Laterality: Left;  C3F8  . KNEE ARTHROSCOPY Left   . LASER PHOTO ABLATION Left 06/28/2019   Procedure: Laser Photo Ablation;  Surgeon: Bernarda Caffey, MD;  Location: [place];  Service: Ophthalmology;  Laterality: Left;  . PARS PLANA VITRECTOMY Left 06/28/2019   Procedure: PARS PLANA VITRECTOMY WITH 25 GAUGE;  Surgeon: Bernarda Caffey, MD;  Location: [place];  Service: Ophthalmology;  Laterality: Left;  . PHOTOCOAGULATION WITH LASER Right 11/26/2014   Procedure: PHOTOCOAGULATION WITH LASER;  Surgeon:  Hayden Pedro, MD;  Location: [place];  Service: Ophthalmology;  Laterality: Right;  . PHOTOCOAGULATION WITH LASER Left 08/21/2018   Procedure: PHOTOCOAGULATION WITH LASER;  Surgeon: Bernarda Caffey, MD;  Location: [place];  Service: Ophthalmology;  Laterality: Left;  . REPAIR OF COMPLEX TRACTION RETINAL DETACHMENT Left 12/06/2018   Procedure: REPAIR OF COMPLEX TRACTION RETINAL DETACHMENT, 25 gauge vitrectomy, endolaser photocoagulation, memebrane peel, perfluoron injection,  and silicone oil;  Surgeon: Bernarda Caffey, MD;  Location: [place];  Service: Ophthalmology;  Laterality: Left;  . RETINAL DETACHMENT SURGERY Bilateral    SBP OD - Dr. Tempie Hoist (1.12.16).  SBP - Dr. Bernarda Caffey (10.7.19)  . SCLERAL BUCKLE Right 11/26/2014   Procedure: SCLERAL BUCKLE RIGHT EYE ;  Surgeon: Hayden Pedro, MD;  Location: [place];  Service: Ophthalmology;  Laterality: Right;  .  SCLERAL BUCKLE WITH POSSIBLE 25 GAUGE PARS PLANA VITRECTOMY Left 08/21/2018   Procedure: SCLERAL BUCKLE WITH 25 GAUGE PARS PLANA VITRECTOMY;  Surgeon: Bernarda Caffey, MD;  Location: [place];  Service: Ophthalmology;  Laterality: Left;  . SILICON OIL REMOVAL Left 7/86/7544   Procedure: Silicon Oil Removal;  Surgeon: Bernarda Caffey, MD;  Location: [place];  Service: Ophthalmology;  Laterality: Left;  . TONSILLECTOMY      FAMILY HISTORY Family History  Problem Relation Age of Onset  . Cancer Mother   . Heart failure Father     SOCIAL HISTORY Social History   Tobacco Use  . Smoking status: Former Smoker    Packs/day: 0.50    Years: 10.00    Pack years: 5.00    Types: Cigarettes    Quit date: 1970    Years since quitting: 51.9  . Smokeless tobacco: Never Used  . Tobacco comment: quit in 1970's  Vaping Use  . Vaping Use: Never used  Substance Use Topics  . Alcohol use: No  . Drug use: No         OPHTHALMIC EXAM:  Base Eye Exam    Visual Acuity (Snellen - Linear)      Right Left   Dist cc 20/40 20/80   Dist ph cc NI 20/50    Correction: Glasses       Tonometry (Tonopen, 9:52 AM)      Right Left   Pressure 13 14       Pupils      Dark Light Shape React APD   Right 3 2 Round Minimal None   Left 3 2 Round Minimal None       Visual Fields (Counting fingers)      Left Right    Full Full       Extraocular Movement      Right Left    Full Full       Neuro/Psych    Oriented x3: Yes   Mood/Affect: Normal       Dilation    Both eyes: 1.0% Mydriacyl, 2.5% Phenylephrine @ 9:43 AM        Slit Lamp and Fundus Exam    Slit Lamp Exam      Right Left   Lids/Lashes Mild Meibomian gland dysfunction, Telangiectasia, Ptosis Ptosis, Dermatochalasis - upper lid, mild UL Telangiectasia   Conjunctiva/Sclera White and quiet Stable resolution of Injection and focal nodule; white and quiet   Cornea 1+ Punctate epithelial erosions, Temporal Well healed cataract wounds, Debris in tear film 2+ inferior Punctate epithelial erosions, Debris in tear film   Anterior Chamber Deep and quiet Deep, clear   Iris Round and dilated Round and dilated   Lens PC IOL in good position, trace PCO PC IOL in good position, trace Posterior capsular opacification   Vitreous Vitreous syneresis, mild pigment in anterior vitreous, Posterior vitreous detachment; scattered vitreous debris Post vitrectomy, 1-2+pigment       Fundus Exam      Right Left   Disc Pink and Sharp Sharp rim, mild tilt, trace temporal pallor, temporal Peripapillary atrophy, Compact   C/D Ratio 0.2 0.3   Macula Blunted foveal reflex, Retinal pigment epithelial mottling, ERM, No heme or edema Flat, good foveal reflex, +epiretinal membrane, mild Retinal pigment epithelial mottling, +Cystic changes nasal to fovea   Vessels Vascular attenuation mild attenuation   Periphery Attached over scleral buckle, good buckle height, good scarring over buckle Retina attached over buckle; good buckle height; good laser surrounding  buckle w/ new row posterior to buckle; +fibrosis at  0300 over buckle; retinotomy from 0300-0430 with good laser surrounding        Refraction    Wearing Rx      Sphere Cylinder Axis   Right -1.50 +0.75 017   Left -2.00 Sphere    Type: SVL       Manifest Refraction      Sphere Cylinder Dist VA   Right      Left -2.50 Sphere 20/60+1          IMAGING AND PROCEDURES  Imaging and Procedures for _0 @  OCT, Retina - OU - Both Eyes       Right Eye Quality was good. Central Foveal Thickness: 295. Progression has been stable. Findings include normal foveal contour, no IRF, no SRF, epiretinal membrane (Stable ERM).   Left Eye Quality was good. Central Foveal Thickness: 283. Progression has worsened. Findings include epiretinal membrane, normal foveal contour, no SRF, intraretinal fluid (Interval increase in RF/cystic changes nasal parafovea; Peristent ERM.).   Notes *Images captured and stored on drive  Diagnosis / Impression:  OD: mild ERM; NFP; no IRF/SRF-stable from prior OS: Interval increase in RF/cystic changes nasal parafovea; Peristent ERM.  Clinical management:  See below  Abbreviations: NFP - Normal foveal profile. CME - cystoid macular edema. PED - pigment epithelial detachment. IRF - intraretinal fluid. SRF - subretinal fluid. EZ - ellipsoid zone. ERM - epiretinal membrane. ORA - outer retinal atrophy. ORT - outer retinal tubulation. SRHM - subretinal hyper-reflective material                 ASSESSMENT/PLAN:    ICD-10-CM   1. Left retinal detachment  H33.22   2. Traction detachment of left retina  H33.42   3. 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 CME (cystoid macular edema), left  H35.352   6. History of retinal detachment  Z86.69   7. Pseudophakia of both eyes  Z96.1     1-3. Retinal detachment, OS - originally: macula-sparing inferior retinal detachment from 3-6 oclock - large HST at 0500 and 2 small tears at  300 within the detached retina - s/pSBP + PPV/PFC/EL/FAX/14% C3F8 OS, 10.07.19 - progressive fibrosis/PVR just posterior to buckle around 0400 with +SRF tracking posterior to buckle -- focal tractional/PVR detachment OS - s/p JYN/WG/NFA/[id number]/ON/6295MW silicon oil OS, 4.13.2440  - now s/p 10U PPV with silicon oil removal (72.53.66)  - retina attachedand in good position -- good buckle height and laser over buckle and around retinotomy - IOP okay at 14 today  - BCVA 20/50 -- stable  4. Nodular scleritis OS -- improved  - history of recurrent episodes following the tapering of meds (PO pred and topical PF/Prolensa)  - sup temporal quadrant -- was 3.5x4.0 mm nodule with surrounding inflammation -- now stably resolved             - limited lab work to rule out rheumatologic cause--all results negative   CBC, CMP   HIV   HLA Panel   ANA   ANCA   ACE, Lysozyme   RF   ESR, CRP  - following with Dr. Helane Rima -- last visit on 10.28.21.    - currently on PF BID and Prolensa QD OS  5. CME OS - interval increase / redevelopment of cystic changes OS  - mild post-op CME -- recurrent  - s/p sub-tenons  kenalog (03.13.20)  - OCT today shows interval increase/recurrence of IRF/cystic changes OS  - BCVA stable at 20/50  - recommend cont PF BID OS, but increasing Prolensa to QID OS  - f/u 4 wks  6. History of retinal detachment OD  - S/P SBP OD 11/26/14 -- Dr. Tempie Hoist  - looks great -- retina in great position  - monitor  7. Pseudophakia OU  - s/p CE/IOL OU ([place])  - doing well  - monitor  Ophthalmic Meds Ordered this visit:  Meds ordered this encounter  Medications  . Bromfenac Sodium (PROLENSA) 0.07 % SOLN    Sig: Place 1 drop into the left eye 4 (four) times daily.    Dispense:  6 mL    Refill:  3      Return in about 4 weeks (around 11/18/2020) for f/u CME OS, DFE, OCT.  There are no Patient Instructions on file for this  visit.  Explained the diagnoses, plan, and follow up with the patient and they expressed understanding.  Patient expressed understanding of the importance of proper follow up care.   This document serves as a record of services personally performed by Gardiner Sleeper, MD, PhD. It was created on their behalf by Estill Bakes, COT an ophthalmic technician. The creation of this record is the provider's dictation and/or activities during the visit.    Electronically signed by: Estill Bakes, COT 12.2.21 @ 12:18 AM   This document serves as a record of services personally performed by Gardiner Sleeper, MD, PhD. It was created on their behalf by San Jetty. Owens Shark, OA an ophthalmic technician. The creation of this record is the provider's dictation and/or activities during the visit.    Electronically signed by: San Jetty. Marguerita Merles 12.07.2021 12:18 AM  Gardiner Sleeper, M.D., Ph.D. Diseases & Surgery of the Retina and [place] 10/21/2020   I have reviewed the above documentation for accuracy and completeness, and I agree with the above. Gardiner Sleeper, M.D., Ph.D. 10/23/20 12:18 AM   Abbreviations: M myopia (nearsighted); A astigmatism; H hyperopia (farsighted); P presbyopia; Mrx spectacle prescription;  CTL contact lenses; OD right eye; OS left eye; OU both eyes  XT exotropia; ET esotropia; PEK punctate epithelial keratitis; PEE punctate epithelial erosions; DES dry eye syndrome; MGD meibomian gland dysfunction; ATs artificial tears; PFAT's preservative free artificial tears; [place] nuclear sclerotic cataract; PSC posterior subcapsular cataract; ERM epi-retinal membrane; PVD posterior vitreous detachment; RD retinal detachment; DM diabetes mellitus; DR diabetic retinopathy; NPDR non-proliferative diabetic retinopathy; PDR proliferative diabetic retinopathy; CSME clinically significant macular edema; DME diabetic macular edema; dbh dot blot hemorrhages; CWS cotton wool  spot; POAG primary open angle glaucoma; C/D cup-to-disc ratio; HVF humphrey visual field; GVF goldmann visual field; OCT optical coherence tomography; IOP intraocular pressure; BRVO Branch retinal vein occlusion; CRVO central retinal vein occlusion; CRAO central retinal artery occlusion; BRAO branch retinal artery occlusion; RT retinal tear; SB scleral buckle; PPV pars plana vitrectomy; VH Vitreous hemorrhage; PRP panretinal laser photocoagulation; IVK intravitreal kenalog; VMT vitreomacular traction; MH Macular hole;  NVD neovascularization of the disc; NVE neovascularization elsewhere; AREDS age related eye disease study; ARMD age related macular degeneration; POAG primary open angle glaucoma; EBMD epithelial/anterior basement membrane dystrophy; ACIOL anterior chamber intraocular lens; IOL intraocular lens; PCIOL posterior chamber intraocular lens; Phaco/IOL phacoemulsification with intraocular lens placement; [place] photorefractive keratectomy; LASIK laser assisted in situ keratomileusis; HTN hypertension; DM diabetes mellitus; COPD chronic obstructive pulmonary disease

## 2020-10-21 ENCOUNTER — Ambulatory Visit (INDEPENDENT_AMBULATORY_CARE_PROVIDER_SITE_OTHER): Payer: Medicare Other | Admitting: Ophthalmology

## 2020-10-21 ENCOUNTER — Other Ambulatory Visit: Payer: Self-pay

## 2020-10-21 DIAGNOSIS — H3322 Serous retinal detachment, left eye: Secondary | ICD-10-CM | POA: Diagnosis not present

## 2020-10-21 DIAGNOSIS — H3581 Retinal edema: Secondary | ICD-10-CM | POA: Diagnosis not present

## 2020-10-21 DIAGNOSIS — Z961 Presence of intraocular lens: Secondary | ICD-10-CM

## 2020-10-21 DIAGNOSIS — H15092 Other scleritis, left eye: Secondary | ICD-10-CM | POA: Diagnosis not present

## 2020-10-21 DIAGNOSIS — Z8669 Personal history of other diseases of the nervous system and sense organs: Secondary | ICD-10-CM

## 2020-10-21 DIAGNOSIS — H3342 Traction detachment of retina, left eye: Secondary | ICD-10-CM | POA: Diagnosis not present

## 2020-10-21 DIAGNOSIS — H35352 Cystoid macular degeneration, left eye: Secondary | ICD-10-CM

## 2020-10-21 MED ORDER — PROLENSA 0.07 % OP SOLN: 1.0000 [drp] | Freq: Four times a day (QID) | OPHTHALMIC | 3 refills | Status: DC

## 2020-10-23 ENCOUNTER — Encounter (INDEPENDENT_AMBULATORY_CARE_PROVIDER_SITE_OTHER): Payer: Self-pay | Admitting: Ophthalmology

## 2020-11-14 NOTE — Progress Notes (Shared)
[facility] Note  11/18/2020     CHIEF COMPLAINT Patient presents for No chief complaint on file.   HISTORY OF PRESENT ILLNESS: Carla May is a 79 y.o. female who presents to the clinic today for:     Referring physician: Kelton Pillar, MD [place],  [place] [id number]  HISTORICAL INFORMATION:   Selected notes from the MEDICAL RECORD NUMBER Referred by Dr. Len Blalock for concern of retinal hole.   CURRENT MEDICATIONS: Current Outpatient Medications (Ophthalmic Drugs)  Medication Sig  . bacitracin-polymyxin b (POLYSPORIN) ophthalmic ointment Place 1 application into the left eye at bedtime as needed. (Patient not taking: Reported on 10/21/2020)  . Bromfenac Sodium (PROLENSA) 0.07 % SOLN Place 1 drop into the left eye 4 (four) times daily.  . carboxymethylcellulose (REFRESH PLUS) 0.5 % SOLN Place 1 drop into both eyes 3 (three) times daily as needed (for dry eyes).   . dorzolamide-timolol (COSOPT) 22.3-6.8 MG/ML ophthalmic solution Place 1 drop into the left eye 2 (two) times daily. (Patient not taking: Reported on 10/21/2020)  . DUREZOL 0.05 % EMUL Place 1 drop into the left eye 3 (three) times daily. (Patient not taking: Reported on 10/21/2020)   No current facility-administered medications for this visit. (Ophthalmic Drugs)   Current Outpatient Medications (Other)  Medication Sig  . acetaminophen (TYLENOL) 650 MG CR tablet Take 650 mg by mouth every 8 (eight) hours as needed for pain.  Marland Kitchen aspirin EC 81 MG tablet Take 81 mg by mouth daily.  Marland Kitchen ibuprofen (ADVIL) 800 MG tablet Take 800 mg by mouth every 8 (eight) hours as needed. (Patient not taking: Reported on 10/21/2020)  . Multiple Vitamin (MULTIVITAMIN WITH MINERALS) TABS tablet Take 1 tablet by mouth daily. One-A-Day for Women 50+  . nabumetone (RELAFEN) 500 MG tablet Take 750 mg by mouth 3 (three) times daily. (Patient not taking: Reported on 10/21/2020)  . Omega-3  Fatty Acids (FISH OIL ULTRA) 1400 MG CAPS Take 1,400 mg by mouth daily.  Marland Kitchen omeprazole (PRILOSEC OTC) 20 MG tablet Take 20 mg by mouth daily.  . predniSONE (DELTASONE) 20 MG tablet Take 3 tablets (60 mg total) by mouth daily. (Patient not taking: Reported on 10/21/2020)   Current Facility-Administered Medications (Other)  Medication Route  . triamcinolone acetonide (KENALOG-40) injection 4 mg Intravitreal      REVIEW OF SYSTEMS:    ALLERGIES Allergies  Allergen Reactions  . Atorvastatin Other (See Comments)    Muscle aches/ memory loss  . Penicillins Itching, Nausea Only and Other (See Comments)    Did it involve swelling of the face/tongue/throat, SOB, or low BP? Unknown Did it involve sudden or severe rash/hives, skin peeling, or any reaction on the inside of your mouth or nose? Itching Did you need to seek medical attention at a hospital or doctor's office? No When did it last happen? 1970s If all above answers are "NO", may proceed with cephalosporin use.   . Sulfa Antibiotics Nausea Only    PAST MEDICAL HISTORY Past Medical History:  Diagnosis Date  . Anxiety   . Arthritis    back. neck  . Diabetes mellitus without complication (Carla May)   . GERD (gastroesophageal reflux disease)   . History of palpitations   . HOH (hard of hearing) 06/28/2019  . Hyperlipidemia    diet controlled and fish oil  . MVP (mitral valve prolapse)    never has caused any problems per patient on 06/27/19  .  PONV (postoperative nausea and vomiting)    No problems in 08/2018; however has had previous problems  . Pre-diabetes    diet controlled, no med  . Retinal detachment    OU  . SVD (spontaneous vaginal delivery)    x 2   Past Surgical History:  Procedure Laterality Date  . ABDOMINAL HYSTERECTOMY  1970's  . AIR/FLUID EXCHANGE Left 06/28/2019   Procedure: Air/Fluid Exchange;  Surgeon: Bernarda Caffey, MD;  Location: [facility];  Service: Ophthalmology;  Laterality: Left;  . CATARACT EXTRACTION  Bilateral    Dr. Talbert Forest  . COLONOSCOPY     polyp  . EYE SURGERY Bilateral    Cat Sx and RD repair  . GAS INSERTION Right 11/26/2014   Procedure: INSERTION OF GAS;  Surgeon: Hayden Pedro, MD;  Location: [facility];  Service: Ophthalmology;  Laterality: Right;  C3F8  . GAS/FLUID EXCHANGE Left 08/21/2018   Procedure: GAS/FLUID EXCHANGE;  Surgeon: Bernarda Caffey, MD;  Location: [facility];  Service: Ophthalmology;  Laterality: Left;  C3F8  . KNEE ARTHROSCOPY Left   . LASER PHOTO ABLATION Left 06/28/2019   Procedure: Laser Photo Ablation;  Surgeon: Bernarda Caffey, MD;  Location: [facility];  Service: Ophthalmology;  Laterality: Left;  . PARS PLANA VITRECTOMY Left 06/28/2019   Procedure: PARS PLANA VITRECTOMY WITH 25 GAUGE;  Surgeon: Bernarda Caffey, MD;  Location: [facility];  Service: Ophthalmology;  Laterality: Left;  . PHOTOCOAGULATION WITH LASER Right 11/26/2014   Procedure: PHOTOCOAGULATION WITH LASER;  Surgeon: Hayden Pedro, MD;  Location: [facility];  Service: Ophthalmology;  Laterality: Right;  . PHOTOCOAGULATION WITH LASER Left 08/21/2018   Procedure: PHOTOCOAGULATION WITH LASER;  Surgeon: Bernarda Caffey, MD;  Location: [facility];  Service: Ophthalmology;  Laterality: Left;  . REPAIR OF COMPLEX TRACTION RETINAL DETACHMENT Left 12/06/2018   Procedure: REPAIR OF COMPLEX TRACTION RETINAL DETACHMENT, 25 gauge vitrectomy, endolaser photocoagulation, memebrane peel, perfluoron injection,  and silicone oil;  Surgeon: Bernarda Caffey, MD;  Location: [facility];  Service: Ophthalmology;  Laterality: Left;  . RETINAL DETACHMENT SURGERY Bilateral    SBP OD - Dr. Tempie Hoist (1.12.16).  SBP - Dr. Bernarda Caffey (10.7.19)  . SCLERAL BUCKLE Right 11/26/2014   Procedure: SCLERAL BUCKLE RIGHT EYE ;  Surgeon: Hayden Pedro, MD;  Location: [facility];  Service: Ophthalmology;  Laterality: Right;  . SCLERAL BUCKLE WITH POSSIBLE 25 GAUGE PARS PLANA VITRECTOMY Left 08/21/2018   Procedure: SCLERAL BUCKLE WITH 25 GAUGE PARS PLANA VITRECTOMY;  Surgeon:  Bernarda Caffey, MD;  Location: [facility];  Service: Ophthalmology;  Laterality: Left;  . SILICON OIL REMOVAL Left 5/32/9924   Procedure: Silicon Oil Removal;  Surgeon: Bernarda Caffey, MD;  Location: [facility];  Service: Ophthalmology;  Laterality: Left;  . TONSILLECTOMY      FAMILY HISTORY Family History  Problem Relation Age of Onset  . Cancer Mother   . Heart failure Father     SOCIAL HISTORY Social History   Tobacco Use  . Smoking status: Former Smoker    Packs/day: 0.50    Years: 10.00    Pack years: 5.00    Types: Cigarettes    Quit date: 1970    Years since quitting: 52.0  . Smokeless tobacco: Never Used  . Tobacco comment: quit in 1970's  Vaping Use  . Vaping Use: Never used  Substance Use Topics  . Alcohol use: No  . Drug use: No         OPHTHALMIC EXAM:  Not recorded  IMAGING AND PROCEDURES  Imaging and Procedures for '@TODAY' @           ASSESSMENT/PLAN:  No diagnosis found.  1-3. Retinal detachment, OS - originally: macula-sparing inferior retinal detachment from 3-6 oclock - large HST at 0500 and 2 small tears at 300 within the detached retina - s/pSBP + PPV/PFC/EL/FAX/14% C3F8 OS, 10.07.19 - progressive fibrosis/PVR just posterior to buckle around 0400 with +SRF tracking posterior to buckle -- focal tractional/PVR detachment OS - s/p BCW/UG/QBV/[id number] silicon oil OS, 9.15.0569  - now s/p 80Y PPV with silicon oil removal (80.16.55)  - retina attachedand in good position -- good buckle height and laser over buckle and around retinotomy - IOP okay at 14 today  - BCVA 20/50 -- stable  4. Nodular scleritis OS -- improved  - history of recurrent episodes following the tapering of meds (PO pred and topical PF/Prolensa)  - sup temporal quadrant -- was 3.5x4.0 mm nodule with surrounding inflammation -- now stably resolved             - limited lab work to rule out  rheumatologic cause--all results negative   CBC, CMP   HIV   HLA Panel   ANA   ANCA   ACE, Lysozyme   RF   ESR, CRP  - following with Dr. Helane Rima -- last visit on 10.28.21.    - currently on PF BID and Prolensa QD OS  5. CME OS - interval increase / redevelopment of cystic changes OS  - mild post-op CME -- recurrent  - s/p sub-tenons kenalog (03.13.20)  - OCT today shows interval increase/recurrence of IRF/cystic changes OS  - BCVA stable at 20/50  - recommend cont PF BID OS, but increasing Prolensa to QID OS  - f/u 4 wks  6. History of retinal detachment OD  - S/P SBP OD 11/26/14 -- Dr. Tempie Hoist  - looks great -- retina in great position  - monitor  7. Pseudophakia OU  - s/p CE/IOL OU ([place])  - doing well  - monitor  Ophthalmic Meds Ordered this visit:  No orders of the defined types were placed in this encounter.     No follow-ups on file.  There are no Patient Instructions on file for this visit.  Explained the diagnoses, plan, and follow up with the patient and they expressed understanding.  Patient expressed understanding of the importance of proper follow up care.     Abbreviations: M myopia (nearsighted); A astigmatism; H hyperopia (farsighted); P presbyopia; Mrx spectacle prescription;  CTL contact lenses; OD right eye; OS left eye; OU both eyes  XT exotropia; ET esotropia; PEK punctate epithelial keratitis; PEE punctate epithelial erosions; DES dry eye syndrome; MGD meibomian gland dysfunction; ATs artificial tears; PFAT's preservative free artificial tears; [place] nuclear sclerotic cataract; PSC posterior subcapsular cataract; ERM epi-retinal membrane; PVD posterior vitreous detachment; RD retinal detachment; DM diabetes mellitus; DR diabetic retinopathy; NPDR non-proliferative diabetic retinopathy; PDR proliferative diabetic retinopathy; CSME clinically significant macular edema; DME diabetic macular edema; dbh dot blot hemorrhages; CWS cotton wool spot; POAG  primary open angle glaucoma; C/D cup-to-disc ratio; HVF humphrey visual field; GVF goldmann visual field; OCT optical coherence tomography; IOP intraocular pressure; BRVO Branch retinal vein occlusion; CRVO central retinal vein occlusion; CRAO central retinal artery occlusion; BRAO branch retinal artery occlusion; RT retinal tear; SB scleral buckle; PPV pars plana vitrectomy; VH Vitreous hemorrhage; PRP panretinal laser photocoagulation; IVK intravitreal kenalog; VMT vitreomacular traction; MH Macular hole;  NVD neovascularization of the disc; NVE  neovascularization elsewhere; AREDS age related eye disease study; ARMD age related macular degeneration; POAG primary open angle glaucoma; EBMD epithelial/anterior basement membrane dystrophy; ACIOL anterior chamber intraocular lens; IOL intraocular lens; PCIOL posterior chamber intraocular lens; Phaco/IOL phacoemulsification with intraocular lens placement; [place] photorefractive keratectomy; LASIK laser assisted in situ keratomileusis; HTN hypertension; DM diabetes mellitus; COPD chronic obstructive pulmonary disease

## 2020-11-18 ENCOUNTER — Encounter (INDEPENDENT_AMBULATORY_CARE_PROVIDER_SITE_OTHER): Payer: Medicare Other | Admitting: Ophthalmology

## 2020-11-18 DIAGNOSIS — Z8669 Personal history of other diseases of the nervous system and sense organs: Secondary | ICD-10-CM

## 2020-11-18 DIAGNOSIS — H15092 Other scleritis, left eye: Secondary | ICD-10-CM

## 2020-11-18 DIAGNOSIS — H3342 Traction detachment of retina, left eye: Secondary | ICD-10-CM

## 2020-11-18 DIAGNOSIS — H3322 Serous retinal detachment, left eye: Secondary | ICD-10-CM

## 2020-11-18 DIAGNOSIS — H35352 Cystoid macular degeneration, left eye: Secondary | ICD-10-CM

## 2020-11-18 DIAGNOSIS — Z961 Presence of intraocular lens: Secondary | ICD-10-CM

## 2020-11-18 DIAGNOSIS — H3581 Retinal edema: Secondary | ICD-10-CM

## 2020-11-20 NOTE — Progress Notes (Addendum)
[facility] Note  11/25/2020     CHIEF COMPLAINT Patient presents for Retina Follow Up   HISTORY OF PRESENT ILLNESS: Carla May is a 80 y.o. female who presents to the clinic today for:   HPI    Retina Follow Up    Patient presents with  Other.  In left eye.  This started 4 weeks ago.  I, the attending physician,  performed the HPI with the patient and updated documentation appropriately.          Comments    Patient here for 4 weeks retina follow up for nodular scleritis OS. Patient states vision is ok. About the same. No eye pain OS. OD is irritated and inflamed around lid and temporally. Red and swollen.       Last edited by Bernarda Caffey, MD on 11/25/2020  1:24 PM. (History)    pt states her left eye cleared up, she states she was able to read well out of it, she states her right eye vision has decreased, she think the eye might be infected, she states she is having pain the corner, her next appt with Helane Rima is in February, she is taking PF BID and Prolensa QID  Referring physician: Kelton Pillar, MD [address],  [place] [address]  HISTORICAL INFORMATION:   Selected notes from the MEDICAL RECORD NUMBER Referred by Dr. Len Blalock for concern of retinal hole.   CURRENT MEDICATIONS: Current Outpatient Medications (Ophthalmic Drugs)  Medication Sig   bacitracin-polymyxin b (POLYSPORIN) ophthalmic ointment Place 1 application into the left eye at bedtime as needed. (Patient not taking: Reported on 10/21/2020)   Bromfenac Sodium (PROLENSA) 0.07 % SOLN Place 1 drop into the left eye 4 (four) times daily.   carboxymethylcellulose (REFRESH PLUS) 0.5 % SOLN Place 1 drop into both eyes 3 (three) times daily as needed (for dry eyes).    dorzolamide-timolol (COSOPT) 22.3-6.8 MG/ML ophthalmic solution Place 1 drop into the left eye 2 (two) times daily. (Patient not taking: Reported on 10/21/2020)   DUREZOL 0.05 % EMUL  Place 1 drop into the left eye 3 (three) times daily. (Patient not taking: Reported on 10/21/2020)   No current facility-administered medications for this visit. (Ophthalmic Drugs)   Current Outpatient Medications (Other)  Medication Sig   acetaminophen (TYLENOL) 650 MG CR tablet Take 650 mg by mouth every 8 (eight) hours as needed for pain.   aspirin EC 81 MG tablet Take 81 mg by mouth daily.   ibuprofen (ADVIL) 800 MG tablet Take 800 mg by mouth every 8 (eight) hours as needed. (Patient not taking: Reported on 10/21/2020)   Multiple Vitamin (MULTIVITAMIN WITH MINERALS) TABS tablet Take 1 tablet by mouth daily. One-A-Day for Women 50+   nabumetone (RELAFEN) 500 MG tablet Take 750 mg by mouth 3 (three) times daily. (Patient not taking: Reported on 10/21/2020)   Omega-3 Fatty Acids (FISH OIL ULTRA) 1400 MG CAPS Take 1,400 mg by mouth daily.   omeprazole (PRILOSEC OTC) 20 MG tablet Take 20 mg by mouth daily.   predniSONE (DELTASONE) 20 MG tablet Take 3 tablets (60 mg total) by mouth daily. (Patient not taking: Reported on 10/21/2020)   Current Facility-Administered Medications (Other)  Medication Route   triamcinolone acetonide (KENALOG-40) injection 4 mg Intravitreal      REVIEW OF SYSTEMS: ROS    Positive for: Gastrointestinal, Musculoskeletal, Endocrine, Cardiovascular, Eyes   Negative for: Constitutional, Neurological, Skin, Genitourinary, HENT, Respiratory,  Psychiatric, Allergic/Imm, Heme/Lymph   Last edited by Theodore Demark, COA on 11/25/2020  9:56 AM. (History)       ALLERGIES Allergies  Allergen Reactions   Atorvastatin Other (See Comments)    Muscle aches/ memory loss   Penicillins Itching, Nausea Only and Other (See Comments)    Did it involve swelling of the face/tongue/throat, SOB, or low BP? Unknown Did it involve sudden or severe rash/hives, skin peeling, or any reaction on the inside of your mouth or nose? Itching Did you need to seek medical attention at  a hospital or doctor's office? No When did it last happen? 1970s If all above answers are NO, may proceed with cephalosporin use.    Sulfa Antibiotics Nausea Only    PAST MEDICAL HISTORY Past Medical History:  Diagnosis Date   Anxiety    Arthritis    back. neck   Diabetes mellitus without complication (HCC)    GERD (gastroesophageal reflux disease)    History of palpitations    HOH (hard of hearing) 06/28/2019   Hyperlipidemia    diet controlled and fish oil   MVP (mitral valve prolapse)    never has caused any problems per patient on 06/27/19   PONV (postoperative nausea and vomiting)    No problems in 08/2018; however has had previous problems   Pre-diabetes    diet controlled, no med   Retinal detachment    OU   SVD (spontaneous vaginal delivery)    x 2   Past Surgical History:  Procedure Laterality Date   ABDOMINAL HYSTERECTOMY  1970's   AIR/FLUID EXCHANGE Left 06/28/2019   Procedure: Air/Fluid Exchange;  Surgeon: Bernarda Caffey, MD;  Location: [place];  Service: Ophthalmology;  Laterality: Left;   CATARACT EXTRACTION Bilateral    Dr. Talbert Forest   COLONOSCOPY     polyp   EYE SURGERY Bilateral    Cat Sx and RD repair   GAS INSERTION Right 11/26/2014   Procedure: INSERTION OF GAS;  Surgeon: Hayden Pedro, MD;  Location: [place];  Service: Ophthalmology;  Laterality: Right;  C3F8   GAS/FLUID EXCHANGE Left 08/21/2018   Procedure: GAS/FLUID EXCHANGE;  Surgeon: Bernarda Caffey, MD;  Location: [place];  Service: Ophthalmology;  Laterality: Left;  C3F8   KNEE ARTHROSCOPY Left    LASER PHOTO ABLATION Left 06/28/2019   Procedure: Laser Photo Ablation;  Surgeon: Bernarda Caffey, MD;  Location: [place];  Service: Ophthalmology;  Laterality: Left;   PARS PLANA VITRECTOMY Left 06/28/2019   Procedure: PARS PLANA VITRECTOMY WITH 25 GAUGE;  Surgeon: Bernarda Caffey, MD;  Location: [place];  Service: Ophthalmology;  Laterality: Left;   PHOTOCOAGULATION WITH LASER Right 11/26/2014    Procedure: PHOTOCOAGULATION WITH LASER;  Surgeon: Hayden Pedro, MD;  Location: [place];  Service: Ophthalmology;  Laterality: Right;   PHOTOCOAGULATION WITH LASER Left 08/21/2018   Procedure: PHOTOCOAGULATION WITH LASER;  Surgeon: Bernarda Caffey, MD;  Location: [place];  Service: Ophthalmology;  Laterality: Left;   REPAIR OF COMPLEX TRACTION RETINAL DETACHMENT Left 12/06/2018   Procedure: REPAIR OF COMPLEX TRACTION RETINAL DETACHMENT, 25 gauge vitrectomy, endolaser photocoagulation, memebrane peel, perfluoron injection,  and silicone oil;  Surgeon: Bernarda Caffey, MD;  Location: [place];  Service: Ophthalmology;  Laterality: Left;   RETINAL DETACHMENT SURGERY Bilateral    SBP OD - Dr. Tempie Hoist (1.12.16).  SBP - Dr. Bernarda Caffey (10.7.19)   SCLERAL BUCKLE Right 11/26/2014   Procedure: SCLERAL BUCKLE RIGHT EYE ;  Surgeon: Hayden Pedro, MD;  Location: [place] [facility];  Service: Ophthalmology;  Laterality: Right;   SCLERAL BUCKLE WITH POSSIBLE 25 GAUGE PARS PLANA VITRECTOMY Left 08/21/2018   Procedure: SCLERAL BUCKLE WITH 25 GAUGE PARS PLANA VITRECTOMY;  Surgeon: Bernarda Caffey, MD;  Location: [place];  Service: Ophthalmology;  Laterality: Left;   SILICON OIL REMOVAL Left 6/97/9480   Procedure: Silicon Oil Removal;  Surgeon: Bernarda Caffey, MD;  Location: [place];  Service: Ophthalmology;  Laterality: Left;   TONSILLECTOMY      FAMILY HISTORY Family History  Problem Relation Age of Onset   Cancer Mother    Heart failure Father     SOCIAL HISTORY Social History   Tobacco Use   Smoking status: Former Smoker    Packs/day: 0.50    Years: 10.00    Pack years: 5.00    Types: Cigarettes    Quit date: 1970    Years since quitting: 52.0   Smokeless tobacco: Never Used   Tobacco comment: quit in 1970's  Vaping Use   Vaping Use: Never used  Substance Use Topics   Alcohol use: No   Drug use: No         OPHTHALMIC EXAM:  Base Eye Exam    Visual Acuity (Snellen - Linear)      Right Left    Dist cc 20/50 -1 20/70 -2   Dist ph cc NI 20/70 +2   Correction: Glasses  Forgot regular glasses. Wore RX sunglasses.       Tonometry (Tonopen, 9:53 AM)      Right Left   Pressure 13 14       Pupils      Dark Light Shape React APD   Right 3 2 Round Minimal None   Left 3 2 Round Minimal None       Visual Fields (Counting fingers)      Left Right    Full Full       Extraocular Movement      Right Left    Full Full       Neuro/Psych    Oriented x3: Yes   Mood/Affect: Normal       Dilation    Both eyes: 1.0% Mydriacyl, 2.5% Phenylephrine @ 9:53 AM        Slit Lamp and Fundus Exam    Slit Lamp Exam      Right Left   Lids/Lashes Dermatochalasis - upper lid, mild MGD Dermatochalasis - upper lid   Conjunctiva/Sclera White and quiet Stable resolution of Injection and focal nodule; white and quiet   Cornea Trace Punctate epithelial erosions, Temporal Well healed cataract wounds, Debris in tear film, mild arcus Trace Punctate epithelial erosions, mild Debris in tear film   Anterior Chamber Deep and quiet Deep, fine pigment   Iris Round and dilated Round and dilated   Lens PC IOL in good position, 1+ PCO SN PC IOL in good position, 1-2+Posterior capsular opacification   Vitreous Vitreous syneresis, mild pigment in anterior vitreous, Posterior vitreous detachment; scattered vitreous debris Post vitrectomy, 1+pigment       Fundus Exam      Right Left   Disc Pink and Sharp Sharp rim, mild tilt, trace temporal pallor, temporal Peripapillary atrophy, Compact   C/D Ratio 0.2 0.3   Macula Good foveal reflex, Retinal pigment epithelial mottling, ERM, No heme or edema Flat, good foveal reflex, +epiretinal membrane, mild Retinal pigment epithelial mottling, +Cystic changes nasal to fovea -- slightly improved   Vessels attenuated, mild tortuousity attenuated, mild  tortuousity   Periphery Attached over scleral buckle, good buckle height, good scarring over buckle Retina attached  over buckle; good buckle height; good laser surrounding buckle w/ new row posterior to buckle; +fibrosis at 0300 over buckle; retinotomy from 0300-0430 with good laser surrounding        Refraction    Wearing Rx      Sphere Cylinder Axis   Right -1.50 +0.75 017   Left -2.00 Sphere    Type: SVL          IMAGING AND PROCEDURES  Imaging and Procedures for _0 @  OCT, Retina - OU - Both Eyes       Right Eye Quality was good. Central Foveal Thickness: 293. Progression has been stable. Findings include normal foveal contour, no IRF, no SRF, epiretinal membrane (Stable ERM).   Left Eye Quality was good. Central Foveal Thickness: 278. Progression has improved. Findings include epiretinal membrane, normal foveal contour, no SRF, intraretinal fluid (Mild interval improvement in IRF nasal parafovea; Peristent ERM.).   Notes *Images captured and stored on drive  Diagnosis / Impression:  OD: mild ERM; NFP; no IRF/SRF-stable from prior OS: +CME--Mild interval improvement in IRF nasal parafovea; Peristent ERM.  Clinical management:  See below  Abbreviations: NFP - Normal foveal profile. CME - cystoid macular edema. PED - pigment epithelial detachment. IRF - intraretinal fluid. SRF - subretinal fluid. EZ - ellipsoid zone. ERM - epiretinal membrane. ORA - outer retinal atrophy. ORT - outer retinal tubulation. SRHM - subretinal hyper-reflective material                 ASSESSMENT/PLAN:    ICD-10-CM   1. Left retinal detachment  H33.22   2. Traction detachment of left retina  H33.42   3. 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 CME (cystoid macular edema), left  H35.352   6. History of retinal detachment  Z86.69   7. Pseudophakia of both eyes  Z96.1     1-3. Retinal detachment, OS - originally: macula-sparing inferior retinal detachment from 3-6 oclock - large HST at 0500 and 2 small tears  at 300 within the detached retina - s/pSBP + PPV/PFC/EL/FAX/14% C3F8 OS, 10.07.19 - progressive fibrosis/PVR just posterior to buckle around 0400 with +SRF tracking posterior to buckle -- focal tractional/PVR detachment OS - s/p DIY/ME/BRA/[id number] silicon oil OS, 4.58.5929  - now s/p 24M PPV with silicon oil removal (62.86.38)  - retina attachedand in good position -- good buckle height and laser over buckle and around retinotomy - IOP okay at 14 today  - BCVA 20/70 -- decreased  4. Nodular scleritis OS -- improved  - history of recurrent episodes following the tapering of meds (PO pred and topical PF/Prolensa)  - sup temporal quadrant -- was 3.5x4.0 mm nodule with surrounding inflammation -- now stably resolved             - limited lab work to rule out rheumatologic cause--all results negative   CBC, CMP   HIV   HLA Panel   ANA   ANCA   ACE, Lysozyme   RF   ESR, CRP  - following with Dr. Helane Rima -- last visit on 10.28.21.    - currently on PF QID and Prolensa BID OS  5. CME OS   - interval increase / redevelopment of cystic changes OS on 12.09.21  - mild post-op CME -- recurrent  - s/p sub-tenons  kenalog (03.13.20)  - OCT today mild interval improvement in IRF/cystic changes OS  - BCVA decreased to 20/70 from 20/50  - recommend inc PF and Prolensa to QID OS  - f/u 8 wks  6. History of retinal detachment OD  - S/P SBP OD 11/26/14 -- Dr. Tempie Hoist  - looks great -- retina in great position  - monitor  7. Pseudophakia OU  - s/p CE/IOL OU ([place])  - doing well  - monitor  Ophthalmic Meds Ordered this visit:  No orders of the defined types were placed in this encounter.     Return in 2 months (on 01/23/2021) for f/u CME OS, DFE, OCT.  There are no Patient Instructions on file for this visit.  Explained the diagnoses, plan, and follow up with the patient and they expressed understanding.  Patient  expressed understanding of the importance of proper follow up care.   This document serves as a record of services personally performed by Gardiner Sleeper, MD, PhD. It was created on their behalf by San Jetty. Owens Shark, OA an ophthalmic technician. The creation of this record is the provider's dictation and/or activities during the visit.    Electronically signed by: San Jetty. Owens Shark, [place] 01.11.2022 4:07 PM  Gardiner Sleeper, M.D., Ph.D. Diseases & Surgery of the Retina and [place] 11/25/2020   I have reviewed the above documentation for accuracy and completeness, and I agree with the above. Gardiner Sleeper, M.D., Ph.D. 11/27/20 4:07 PM   Abbreviations: M myopia (nearsighted); A astigmatism; H hyperopia (farsighted); P presbyopia; Mrx spectacle prescription;  CTL contact lenses; OD right eye; OS left eye; OU both eyes  XT exotropia; ET esotropia; PEK punctate epithelial keratitis; PEE punctate epithelial erosions; DES dry eye syndrome; MGD meibomian gland dysfunction; ATs artificial tears; PFAT's preservative free artificial tears; [place] nuclear sclerotic cataract; PSC posterior subcapsular cataract; ERM epi-retinal membrane; PVD posterior vitreous detachment; RD retinal detachment; DM diabetes mellitus; DR diabetic retinopathy; NPDR non-proliferative diabetic retinopathy; PDR proliferative diabetic retinopathy; CSME clinically significant macular edema; DME diabetic macular edema; dbh dot blot hemorrhages; CWS cotton wool spot; POAG primary open angle glaucoma; C/D cup-to-disc ratio; HVF humphrey visual field; GVF goldmann visual field; OCT optical coherence tomography; IOP intraocular pressure; BRVO Branch retinal vein occlusion; CRVO central retinal vein occlusion; CRAO central retinal artery occlusion; BRAO branch retinal artery occlusion; RT retinal tear; SB scleral buckle; PPV pars plana vitrectomy; VH Vitreous hemorrhage; PRP panretinal laser photocoagulation; IVK  intravitreal kenalog; VMT vitreomacular traction; MH Macular hole;  NVD neovascularization of the disc; NVE neovascularization elsewhere; AREDS age related eye disease study; ARMD age related macular degeneration; POAG primary open angle glaucoma; EBMD epithelial/anterior basement membrane dystrophy; ACIOL anterior chamber intraocular lens; IOL intraocular lens; PCIOL posterior chamber intraocular lens; Phaco/IOL phacoemulsification with intraocular lens placement; [place] photorefractive keratectomy; LASIK laser assisted in situ keratomileusis; HTN hypertension; DM diabetes mellitus; COPD chronic obstructive pulmonary disease

## 2020-11-25 ENCOUNTER — Ambulatory Visit (INDEPENDENT_AMBULATORY_CARE_PROVIDER_SITE_OTHER): Payer: Medicare Other | Admitting: Ophthalmology

## 2020-11-25 ENCOUNTER — Other Ambulatory Visit: Payer: Self-pay

## 2020-11-25 ENCOUNTER — Encounter (INDEPENDENT_AMBULATORY_CARE_PROVIDER_SITE_OTHER): Payer: Self-pay | Admitting: Ophthalmology

## 2020-11-25 DIAGNOSIS — H3322 Serous retinal detachment, left eye: Secondary | ICD-10-CM

## 2020-11-25 DIAGNOSIS — H3581 Retinal edema: Secondary | ICD-10-CM | POA: Diagnosis not present

## 2020-11-25 DIAGNOSIS — Z961 Presence of intraocular lens: Secondary | ICD-10-CM

## 2020-11-25 DIAGNOSIS — H15092 Other scleritis, left eye: Secondary | ICD-10-CM

## 2020-11-25 DIAGNOSIS — H35352 Cystoid macular degeneration, left eye: Secondary | ICD-10-CM

## 2020-11-25 DIAGNOSIS — Z8669 Personal history of other diseases of the nervous system and sense organs: Secondary | ICD-10-CM

## 2020-11-25 DIAGNOSIS — H3342 Traction detachment of retina, left eye: Secondary | ICD-10-CM

## 2021-01-19 NOTE — Progress Notes (Signed)
[facility] Note  01/27/2021     CHIEF COMPLAINT Patient presents for Retina Follow Up   HISTORY OF PRESENT ILLNESS: Carla May is a 80 y.o. female who presents to the clinic today for:   HPI    Retina Follow Up    Patient presents with  Other.  In left eye.  This started 2 months ago.  I, the attending physician,  performed the HPI with the patient and updated documentation appropriately.          Comments    Patient ;here for 2 months retina follow up for CME OS. Patient states vision no change. Watery eyes. No eye pain.        Last edited by Bernarda Caffey, MD on 01/28/2021  8:48 AM. (History)    pt saw Dr. Helane Rima on 02.10.22, he decreased Prolensa to once a day and PF to BID  Referring physician: Gwendalyn Ege, [place],  [place] [address]  HISTORICAL INFORMATION:   Selected notes from the MEDICAL RECORD NUMBER Referred by Dr. Len Blalock for concern of retinal hole.   CURRENT MEDICATIONS: Current Outpatient Medications (Ophthalmic Drugs)  Medication Sig  . bacitracin-polymyxin b (POLYSPORIN) ophthalmic ointment Place 1 application into the left eye at bedtime as needed. (Patient not taking: Reported on 10/21/2020)  . Bromfenac Sodium (PROLENSA) 0.07 % SOLN Place 1 drop into the left eye 4 (four) times daily.  . carboxymethylcellulose (REFRESH PLUS) 0.5 % SOLN Place 1 drop into both eyes 3 (three) times daily as needed (for dry eyes).   . dorzolamide-timolol (COSOPT) 22.3-6.8 MG/ML ophthalmic solution Place 1 drop into the left eye 2 (two) times daily. (Patient not taking: Reported on 10/21/2020)  . DUREZOL 0.05 % EMUL Place 1 drop into the left eye 3 (three) times daily. (Patient not taking: Reported on 10/21/2020)   No current facility-administered medications for this visit. (Ophthalmic Drugs)   Current Outpatient Medications (Other)  Medication Sig  . acetaminophen (TYLENOL) 650 MG CR tablet Take 650 mg by mouth  every 8 (eight) hours as needed for pain.  Marland Kitchen aspirin EC 81 MG tablet Take 81 mg by mouth daily.  Marland Kitchen ibuprofen (ADVIL) 800 MG tablet Take 800 mg by mouth every 8 (eight) hours as needed. (Patient not taking: Reported on 10/21/2020)  . Multiple Vitamin (MULTIVITAMIN WITH MINERALS) TABS tablet Take 1 tablet by mouth daily. One-A-Day for Women 50+  . nabumetone (RELAFEN) 500 MG tablet Take 750 mg by mouth 3 (three) times daily. (Patient not taking: Reported on 10/21/2020)  . Omega-3 Fatty Acids (FISH OIL ULTRA) 1400 MG CAPS Take 1,400 mg by mouth daily.  Marland Kitchen omeprazole (PRILOSEC OTC) 20 MG tablet Take 20 mg by mouth daily.  . predniSONE (DELTASONE) 20 MG tablet Take 3 tablets (60 mg total) by mouth daily. (Patient not taking: Reported on 10/21/2020)   Current Facility-Administered Medications (Other)  Medication Route  . triamcinolone acetonide (KENALOG-40) injection 4 mg Intravitreal      REVIEW OF SYSTEMS: ROS    Positive for: Gastrointestinal, Musculoskeletal, Endocrine, Cardiovascular, Eyes   Negative for: Constitutional, Neurological, Skin, Genitourinary, HENT, Respiratory, Psychiatric, Allergic/Imm, Heme/Lymph   Last edited by Theodore Demark, COA on 01/27/2021  9:39 AM. (History)       ALLERGIES Allergies  Allergen Reactions  . Atorvastatin Other (See Comments)    Muscle aches/ memory loss  . Penicillins Itching, Nausea Only and Other (See Comments)  Did it involve swelling of the face/tongue/throat, SOB, or low BP? Unknown Did it involve sudden or severe rash/hives, skin peeling, or any reaction on the inside of your mouth or nose? Itching Did you need to seek medical attention at a hospital or doctor's office? No When did it last happen? 1970s If all above answers are "NO", may proceed with cephalosporin use.   . Sulfa Antibiotics Nausea Only    PAST MEDICAL HISTORY Past Medical History:  Diagnosis Date  . Anxiety   . Arthritis    back. neck  . Diabetes mellitus  without complication ([place])   . GERD (gastroesophageal reflux disease)   . History of palpitations   . HOH (hard of hearing) 06/28/2019  . Hyperlipidemia    diet controlled and fish oil  . MVP (mitral valve prolapse)    never has caused any problems per patient on 06/27/19  . PONV (postoperative nausea and vomiting)    No problems in 08/2018; however has had previous problems  . Pre-diabetes    diet controlled, no med  . Retinal detachment    OU  . SVD (spontaneous vaginal delivery)    x 2   Past Surgical History:  Procedure Laterality Date  . ABDOMINAL HYSTERECTOMY  1970's  . AIR/FLUID EXCHANGE Left 06/28/2019   Procedure: Air/Fluid Exchange;  Surgeon: Bernarda Caffey, MD;  Location: [facility];  Service: Ophthalmology;  Laterality: Left;  . CATARACT EXTRACTION Bilateral    Dr. Talbert Forest  . COLONOSCOPY     polyp  . EYE SURGERY Bilateral    Cat Sx and RD repair  . GAS INSERTION Right 11/26/2014   Procedure: INSERTION OF GAS;  Surgeon: Hayden Pedro, MD;  Location: [facility];  Service: Ophthalmology;  Laterality: Right;  C3F8  . GAS/FLUID EXCHANGE Left 08/21/2018   Procedure: GAS/FLUID EXCHANGE;  Surgeon: Bernarda Caffey, MD;  Location: [facility];  Service: Ophthalmology;  Laterality: Left;  C3F8  . KNEE ARTHROSCOPY Left   . LASER PHOTO ABLATION Left 06/28/2019   Procedure: Laser Photo Ablation;  Surgeon: Bernarda Caffey, MD;  Location: [facility];  Service: Ophthalmology;  Laterality: Left;  . PARS PLANA VITRECTOMY Left 06/28/2019   Procedure: PARS PLANA VITRECTOMY WITH 25 GAUGE;  Surgeon: Bernarda Caffey, MD;  Location: [facility];  Service: Ophthalmology;  Laterality: Left;  . PHOTOCOAGULATION WITH LASER Right 11/26/2014   Procedure: PHOTOCOAGULATION WITH LASER;  Surgeon: Hayden Pedro, MD;  Location: [facility];  Service: Ophthalmology;  Laterality: Right;  . PHOTOCOAGULATION WITH LASER Left 08/21/2018   Procedure: PHOTOCOAGULATION WITH LASER;  Surgeon: Bernarda Caffey, MD;  Location: [facility];  Service: Ophthalmology;   Laterality: Left;  . REPAIR OF COMPLEX TRACTION RETINAL DETACHMENT Left 12/06/2018   Procedure: REPAIR OF COMPLEX TRACTION RETINAL DETACHMENT, 25 gauge vitrectomy, endolaser photocoagulation, memebrane peel, perfluoron injection,  and silicone oil;  Surgeon: Bernarda Caffey, MD;  Location: [facility];  Service: Ophthalmology;  Laterality: Left;  . RETINAL DETACHMENT SURGERY Bilateral    SBP OD - Dr. Tempie Hoist (1.12.16).  SBP - Dr. Bernarda Caffey (10.7.19)  . SCLERAL BUCKLE Right 11/26/2014   Procedure: SCLERAL BUCKLE RIGHT EYE ;  Surgeon: Hayden Pedro, MD;  Location: [facility];  Service: Ophthalmology;  Laterality: Right;  . SCLERAL BUCKLE WITH POSSIBLE 25 GAUGE PARS PLANA VITRECTOMY Left 08/21/2018   Procedure: SCLERAL BUCKLE WITH 25 GAUGE PARS PLANA VITRECTOMY;  Surgeon: Bernarda Caffey, MD;  Location: [facility];  Service: Ophthalmology;  Laterality: Left;  . SILICON OIL REMOVAL Left 06/28/2019  Procedure: Silicon Oil Removal;  Surgeon: Bernarda Caffey, MD;  Location: [facility];  Service: Ophthalmology;  Laterality: Left;  . TONSILLECTOMY      FAMILY HISTORY Family History  Problem Relation Age of Onset  . Cancer Mother   . Heart failure Father     SOCIAL HISTORY Social History   Tobacco Use  . Smoking status: Former Smoker    Packs/day: 0.50    Years: 10.00    Pack years: 5.00    Types: Cigarettes    Quit date: 1970    Years since quitting: 52.2  . Smokeless tobacco: Never Used  . Tobacco comment: quit in 1970's  Vaping Use  . Vaping Use: Never used  Substance Use Topics  . Alcohol use: No  . Drug use: No         OPHTHALMIC EXAM:  Base Eye Exam    Visual Acuity (Snellen - Linear)      Right Left   Dist cc 20/50 20/70   Dist ph cc 20/40 20/50   Correction: Glasses       Tonometry (Tonopen, 9:36 AM)      Right Left   Pressure 13 17       Pupils      Dark Light Shape React APD   Right 3 2 Round Minimal None   Left 3 2 Round Minimal None       Visual Fields (Counting  fingers)      Left Right    Full Full       Extraocular Movement      Right Left    Full Full       Neuro/Psych    Oriented x3: Yes   Mood/Affect: Normal       Dilation    Both eyes: 1.0% Mydriacyl, 2.5% Phenylephrine @ 9:36 AM        Slit Lamp and Fundus Exam    Slit Lamp Exam      Right Left   Lids/Lashes Dermatochalasis - upper lid, mild MGD Dermatochalasis - upper lid   Conjunctiva/Sclera White and quiet Stable resolution of Injection and focal nodule; white and quiet   Cornea 1-2+inferior Punctate epithelial erosions, Temporal Well healed cataract wounds, Debris in tear film, mild arcus 1+Punctate epithelial erosions, mild Debris in tear film   Anterior Chamber Deep and quiet Deep, fine pigment   Iris Round and dilated Round and dilated   Lens PC IOL in good position, 1+ PCO SN PC IOL in good position, 1-2+Posterior capsular opacification   Vitreous Vitreous syneresis, mild pigment in anterior vitreous, Posterior vitreous detachment; scattered vitreous debris Post vitrectomy, 1+pigment       Fundus Exam      Right Left   Disc Pink and Sharp, Compact Sharp rim, mild tilt, trace temporal pallor, temporal Peripapillary atrophy, Compact, temporal PPP / PPA   C/D Ratio 0.2 0.3   Macula Flat, Good foveal reflex, Retinal pigment epithelial mottling, ERM, No heme or edema Flat, good foveal reflex, +epiretinal membrane, mild Retinal pigment epithelial mottling, trace residual Cystic changes nasal to fovea    Vessels attenuated, mild tortuousity attenuated, mild tortuousity   Periphery Attached over scleral buckle, good buckle height, good scarring over buckle Retina attached over buckle; good buckle height; good laser surrounding buckle w/ new row posterior to buckle; +fibrosis at 0300 over buckle; retinotomy from 0300-0430 with good laser surrounding        Refraction    Wearing Rx  Sphere Cylinder Axis   Right -1.50 +0.75 017   Left -2.00 Sphere    Type: SVL           IMAGING AND PROCEDURES  Imaging and Procedures for _0 @  OCT, Retina - OU - Both Eyes       Right Eye Quality was good. Central Foveal Thickness: 292. Progression has been stable. Findings include normal foveal contour, no IRF, no SRF, epiretinal membrane (Stable ERM).   Left Eye Quality was good. Central Foveal Thickness: 277. Progression has improved. Findings include epiretinal membrane, normal foveal contour, no SRF, intraretinal fluid (interval improvement in IRF nasal parafovea; Peristent ERM.).   Notes *Images captured and stored on drive  Diagnosis / Impression:  OD: mild ERM; NFP; no IRF/SRF-stable from prior OS: +CME--interval improvement in IRF nasal parafovea; Peristent ERM.  Clinical management:  See below  Abbreviations: NFP - Normal foveal profile. CME - cystoid macular edema. PED - pigment epithelial detachment. IRF - intraretinal fluid. SRF - subretinal fluid. EZ - ellipsoid zone. ERM - epiretinal membrane. ORA - outer retinal atrophy. ORT - outer retinal tubulation. SRHM - subretinal hyper-reflective material                 ASSESSMENT/PLAN:    ICD-10-CM   1. Left retinal detachment  H33.22   2. Traction detachment of left retina  H33.42   3. 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 CME (cystoid macular edema), left  H35.352   6. History of retinal detachment  Z86.69   7. Pseudophakia of both eyes  Z96.1     1-3. Retinal detachment, OS - originally: macula-sparing inferior retinal detachment from 3-6 oclock - large HST at 0500 and 2 small tears at 300 within the detached retina - s/pSBP + PPV/PFC/EL/FAX/14% C3F8 OS, 10.07.19 - progressive fibrosis/PVR just posterior to buckle around 0400 with +SRF tracking posterior to buckle -- focal tractional/PVR detachment OS - s/p QTM/AU/[id number] silicon oil OS,  6.81.1572  - s/p 62M PPV with silicon oil removal (35.59.74)  - retina attachedand in good position -- good buckle height and laser over buckle and around retinotomy - IOP okay at 17 today  - BCVA 20/50 -- improved  4. Nodular scleritis OS -- improved  - history of recurrent episodes following the tapering of meds (PO pred and topical PF/Prolensa)  - sup temporal quadrant -- was 3.5x4.0 mm nodule with surrounding inflammation -- now stably resolved             - limited lab work to rule out rheumatologic cause--all results negative   CBC, CMP   HIV   HLA Panel   ANA   ANCA   ACE, Lysozyme   RF   ESR, CRP  - following with Dr. Helane Rima -- last visit on 10.28.21.    - currently on PF QID and Prolensa BID OS - decreased to PF BID and Prolensa Qdaily per Dr. Helane Rima  5. CME OS   - interval increase / redevelopment of cystic changes OS on 12.09.21  - mild post-op CME -- recurrent  - s/p sub-tenons kenalog (03.13.20)  - OCT today mild interval improvement in IRF/cystic changes OS  - BCVA improved to 20/50 from 20/70  - discussed possible need for low dose maintenance therapy to keep CME away  - continue drop schedule as above per Dr. Helane Rima -- PF BID and Prolensa qdaily OS  -  sees Dr. Helane Rima next month  - f/u here in 3 mos  6. History of retinal detachment OD  - S/P SBP OD 11/26/14 -- Dr. Tempie Hoist  - looks great -- retina in great position  - monitor  7. Pseudophakia OU  - s/p CE/IOL OU ([place])  - doing well  - monitor  Ophthalmic Meds Ordered this visit:  No orders of the defined types were placed in this encounter.     Return in about 3 months (around 04/29/2021) for f/u CME OS, DFE, OCT.  There are no Patient Instructions on file for this visit.  Explained the diagnoses, plan, and follow up with the patient and they expressed understanding.  Patient expressed understanding of the importance of proper follow up care.   This document serves as a record of  services personally performed by Gardiner Sleeper, MD, PhD. It was created on their behalf by Estill Bakes, COT an ophthalmic technician. The creation of this record is the provider's dictation and/or activities during the visit.    Electronically signed by: Estill Bakes, COT 3.7.22 @ 8:52 AM   This document serves as a record of services personally performed by Gardiner Sleeper, MD, PhD. It was created on their behalf by San Jetty. Owens Shark, OA an ophthalmic technician. The creation of this record is the provider's dictation and/or activities during the visit.    Electronically signed by: San Jetty. Owens Shark, [place] 03.15.2022 8:52 AM  Gardiner Sleeper, M.D., Ph.D. Diseases & Surgery of the Retina and [place] 01/27/2021   I have reviewed the above documentation for accuracy and completeness, and I agree with the above. Gardiner Sleeper, M.D., Ph.D. 01/28/21 8:52 AM    Abbreviations: M myopia (nearsighted); A astigmatism; H hyperopia (farsighted); P presbyopia; Mrx spectacle prescription;  CTL contact lenses; OD right eye; OS left eye; OU both eyes  XT exotropia; ET esotropia; PEK punctate epithelial keratitis; PEE punctate epithelial erosions; DES dry eye syndrome; MGD meibomian gland dysfunction; ATs artificial tears; PFAT's preservative free artificial tears; [place] nuclear sclerotic cataract; PSC posterior subcapsular cataract; ERM epi-retinal membrane; PVD posterior vitreous detachment; RD retinal detachment; DM diabetes mellitus; DR diabetic retinopathy; NPDR non-proliferative diabetic retinopathy; PDR proliferative diabetic retinopathy; CSME clinically significant macular edema; DME diabetic macular edema; dbh dot blot hemorrhages; CWS cotton wool spot; POAG primary open angle glaucoma; C/D cup-to-disc ratio; HVF humphrey visual field; GVF goldmann visual field; OCT optical coherence tomography; IOP intraocular pressure; BRVO Branch retinal vein occlusion; CRVO central  retinal vein occlusion; CRAO central retinal artery occlusion; BRAO branch retinal artery occlusion; RT retinal tear; SB scleral buckle; PPV pars plana vitrectomy; VH Vitreous hemorrhage; PRP panretinal laser photocoagulation; IVK intravitreal kenalog; VMT vitreomacular traction; MH Macular hole;  NVD neovascularization of the disc; NVE neovascularization elsewhere; AREDS age related eye disease study; ARMD age related macular degeneration; POAG primary open angle glaucoma; EBMD epithelial/anterior basement membrane dystrophy; ACIOL anterior chamber intraocular lens; IOL intraocular lens; PCIOL posterior chamber intraocular lens; Phaco/IOL phacoemulsification with intraocular lens placement; [place] photorefractive keratectomy; LASIK laser assisted in situ keratomileusis; HTN hypertension; DM diabetes mellitus; COPD chronic obstructive pulmonary disease

## 2021-01-27 ENCOUNTER — Other Ambulatory Visit: Payer: Self-pay

## 2021-01-27 ENCOUNTER — Ambulatory Visit (INDEPENDENT_AMBULATORY_CARE_PROVIDER_SITE_OTHER): Payer: Medicare Other | Admitting: Ophthalmology

## 2021-01-27 ENCOUNTER — Encounter (INDEPENDENT_AMBULATORY_CARE_PROVIDER_SITE_OTHER): Payer: Self-pay | Admitting: Ophthalmology

## 2021-01-27 DIAGNOSIS — Z8669 Personal history of other diseases of the nervous system and sense organs: Secondary | ICD-10-CM

## 2021-01-27 DIAGNOSIS — H3342 Traction detachment of retina, left eye: Secondary | ICD-10-CM | POA: Diagnosis not present

## 2021-01-27 DIAGNOSIS — H15092 Other scleritis, left eye: Secondary | ICD-10-CM

## 2021-01-27 DIAGNOSIS — Z961 Presence of intraocular lens: Secondary | ICD-10-CM

## 2021-01-27 DIAGNOSIS — H3581 Retinal edema: Secondary | ICD-10-CM | POA: Diagnosis not present

## 2021-01-27 DIAGNOSIS — H3322 Serous retinal detachment, left eye: Secondary | ICD-10-CM

## 2021-01-27 DIAGNOSIS — H35352 Cystoid macular degeneration, left eye: Secondary | ICD-10-CM

## 2021-01-27 DIAGNOSIS — H40052 Ocular hypertension, left eye: Secondary | ICD-10-CM

## 2021-01-28 ENCOUNTER — Encounter (INDEPENDENT_AMBULATORY_CARE_PROVIDER_SITE_OTHER): Payer: Self-pay | Admitting: Ophthalmology

## 2021-04-27 NOTE — Progress Notes (Signed)
[facility] Note  05/05/2021     CHIEF COMPLAINT Patient presents for Retina Follow Up   HISTORY OF PRESENT ILLNESS: Carla May is a 80 y.o. female who presents to the clinic today for:   HPI     Retina Follow Up   Patient presents with  Other.  In left eye.  This started 3 months ago.  I, the attending physician,  performed the HPI with the patient and updated documentation appropriately.        Comments   Patient here for 3 months retina follow up for CME OS.  Patient states vision about the same. No eye pain. Saw Dr Tressia Danas at [facility] last week. Changed medicine.       Last edited by Bernarda Caffey, MD on 05/07/2021  1:20 AM.    Per pt, Dr. Helane Rima told pt to stop Prolensa, and do Pred QID then Durezol BID OS when Pred runs out.  Referring physician: Gwendalyn Ege, [place],  [place] [address]  HISTORICAL INFORMATION:   Selected notes from the MEDICAL RECORD NUMBER Referred by Dr. Len Blalock for concern of retinal hole.   CURRENT MEDICATIONS: Current Outpatient Medications (Ophthalmic Drugs)  Medication Sig   bacitracin-polymyxin b (POLYSPORIN) ophthalmic ointment Place 1 application into the left eye at bedtime as needed. (Patient not taking: Reported on 10/21/2020)   Bromfenac Sodium (PROLENSA) 0.07 % SOLN Place 1 drop into the left eye 4 (four) times daily.   carboxymethylcellulose (REFRESH PLUS) 0.5 % SOLN Place 1 drop into both eyes 3 (three) times daily as needed (for dry eyes).    dorzolamide-timolol (COSOPT) 22.3-6.8 MG/ML ophthalmic solution Place 1 drop into the left eye 2 (two) times daily. (Patient not taking: Reported on 10/21/2020)   DUREZOL 0.05 % EMUL Place 1 drop into the left eye 3 (three) times daily. (Patient not taking: Reported on 10/21/2020)   No current facility-administered medications for this visit. (Ophthalmic Drugs)   Current Outpatient Medications (Other)  Medication Sig   acetaminophen  (TYLENOL) 650 MG CR tablet Take 650 mg by mouth every 8 (eight) hours as needed for pain.   aspirin EC 81 MG tablet Take 81 mg by mouth daily.   ibuprofen (ADVIL) 800 MG tablet Take 800 mg by mouth every 8 (eight) hours as needed. (Patient not taking: Reported on 10/21/2020)   Multiple Vitamin (MULTIVITAMIN WITH MINERALS) TABS tablet Take 1 tablet by mouth daily. One-A-Day for Women 50+   nabumetone (RELAFEN) 500 MG tablet Take 750 mg by mouth 3 (three) times daily. (Patient not taking: Reported on 10/21/2020)   Omega-3 Fatty Acids (FISH OIL ULTRA) 1400 MG CAPS Take 1,400 mg by mouth daily.   omeprazole (PRILOSEC OTC) 20 MG tablet Take 20 mg by mouth daily.   predniSONE (DELTASONE) 20 MG tablet Take 3 tablets (60 mg total) by mouth daily. (Patient not taking: Reported on 10/21/2020)   Current Facility-Administered Medications (Other)  Medication Route   triamcinolone acetonide (KENALOG-40) injection 4 mg Intravitreal      REVIEW OF SYSTEMS: ROS   Positive for: Gastrointestinal, Musculoskeletal, Endocrine, Cardiovascular, Eyes Negative for: Constitutional, Neurological, Skin, Genitourinary, HENT, Respiratory, Psychiatric, Allergic/Imm, Heme/Lymph Last edited by Theodore Demark, COA on 05/05/2021  9:44 AM.        ALLERGIES Allergies  Allergen Reactions   Atorvastatin Other (See Comments)    Muscle aches/ memory loss   Penicillins Itching, Nausea Only and Other (See  Comments)    Did it involve swelling of the face/tongue/throat, SOB, or low BP? Unknown Did it involve sudden or severe rash/hives, skin peeling, or any reaction on the inside of your mouth or nose? Itching Did you need to seek medical attention at a hospital or doctor's office? No When did it last happen? 1970s If all above answers are "NO", may proceed with cephalosporin use.    Sulfa Antibiotics Nausea Only    PAST MEDICAL HISTORY Past Medical History:  Diagnosis Date   Anxiety    Arthritis    back. neck    Diabetes mellitus without complication (HCC)    GERD (gastroesophageal reflux disease)    History of palpitations    HOH (hard of hearing) 06/28/2019   Hyperlipidemia    diet controlled and fish oil   MVP (mitral valve prolapse)    never has caused any problems per patient on 06/27/19   PONV (postoperative nausea and vomiting)    No problems in 08/2018; however has had previous problems   Pre-diabetes    diet controlled, no med   Retinal detachment    OU   SVD (spontaneous vaginal delivery)    x 2   Past Surgical History:  Procedure Laterality Date   ABDOMINAL HYSTERECTOMY  1970's   AIR/FLUID EXCHANGE Left 06/28/2019   Procedure: Air/Fluid Exchange;  Surgeon: Bernarda Caffey, MD;  Location: [place];  Service: Ophthalmology;  Laterality: Left;   CATARACT EXTRACTION Bilateral    Dr. Talbert Forest   COLONOSCOPY     polyp   EYE SURGERY Bilateral    Cat Sx and RD repair   GAS INSERTION Right 11/26/2014   Procedure: INSERTION OF GAS;  Surgeon: Hayden Pedro, MD;  Location: [place];  Service: Ophthalmology;  Laterality: Right;  C3F8   GAS/FLUID EXCHANGE Left 08/21/2018   Procedure: GAS/FLUID EXCHANGE;  Surgeon: Bernarda Caffey, MD;  Location: [place];  Service: Ophthalmology;  Laterality: Left;  C3F8   KNEE ARTHROSCOPY Left    LASER PHOTO ABLATION Left 06/28/2019   Procedure: Laser Photo Ablation;  Surgeon: Bernarda Caffey, MD;  Location: [place];  Service: Ophthalmology;  Laterality: Left;   PARS PLANA VITRECTOMY Left 06/28/2019   Procedure: PARS PLANA VITRECTOMY WITH 25 GAUGE;  Surgeon: Bernarda Caffey, MD;  Location: [place];  Service: Ophthalmology;  Laterality: Left;   PHOTOCOAGULATION WITH LASER Right 11/26/2014   Procedure: PHOTOCOAGULATION WITH LASER;  Surgeon: Hayden Pedro, MD;  Location: [place];  Service: Ophthalmology;  Laterality: Right;   PHOTOCOAGULATION WITH LASER Left 08/21/2018   Procedure: PHOTOCOAGULATION WITH LASER;  Surgeon: Bernarda Caffey, MD;  Location: [place];  Service: Ophthalmology;   Laterality: Left;   REPAIR OF COMPLEX TRACTION RETINAL DETACHMENT Left 12/06/2018   Procedure: REPAIR OF COMPLEX TRACTION RETINAL DETACHMENT, 25 gauge vitrectomy, endolaser photocoagulation, memebrane peel, perfluoron injection,  and silicone oil;  Surgeon: Bernarda Caffey, MD;  Location: [place];  Service: Ophthalmology;  Laterality: Left;   RETINAL DETACHMENT SURGERY Bilateral    SBP OD - Dr. Tempie Hoist (1.12.16).  SBP - Dr. Bernarda Caffey (10.7.19)   SCLERAL BUCKLE Right 11/26/2014   Procedure: SCLERAL BUCKLE RIGHT EYE ;  Surgeon: Hayden Pedro, MD;  Location: [place];  Service: Ophthalmology;  Laterality: Right;   SCLERAL BUCKLE WITH POSSIBLE 25 GAUGE PARS PLANA VITRECTOMY Left 08/21/2018   Procedure: SCLERAL BUCKLE WITH 25 GAUGE PARS PLANA VITRECTOMY;  Surgeon: Bernarda Caffey, MD;  Location: [place];  Service: Ophthalmology;  Laterality: Left;   SILICON  OIL REMOVAL Left 9/39/0300   Procedure: Silicon Oil Removal;  Surgeon: Bernarda Caffey, MD;  Location: [place];  Service: Ophthalmology;  Laterality: Left;   TONSILLECTOMY      FAMILY HISTORY Family History  Problem Relation Age of Onset   Cancer Mother    Heart failure Father     SOCIAL HISTORY Social History   Tobacco Use   Smoking status: Former    Packs/day: 0.50    Years: 10.00    Pack years: 5.00    Types: Cigarettes    Quit date: 1970    Years since quitting: 52.5   Smokeless tobacco: Never   Tobacco comments:    quit in 1970's  Vaping Use   Vaping Use: Never used  Substance Use Topics   Alcohol use: No   Drug use: No         OPHTHALMIC EXAM:  Base Eye Exam     Visual Acuity (Snellen - Linear)       Right Left   Dist cc 20/40 20/60   Dist ph cc 20/30 -1 20/50 +2         Tonometry (Tonopen, 9:41 AM)       Right Left   Pressure 13 16         Pupils       Dark Light Shape React APD   Right 3 2 Round Minimal None   Left 3 2 Round Minimal None         Visual Fields (Counting fingers)       Left  Right    Full Full         Extraocular Movement       Right Left    Full Full         Neuro/Psych     Oriented x3: Yes   Mood/Affect: Normal         Dilation     Both eyes: 1.0% Mydriacyl, 2.5% Phenylephrine @ 9:41 AM           Slit Lamp and Fundus Exam     Slit Lamp Exam       Right Left   Lids/Lashes Dermatochalasis - upper lid, mild MGD Dermatochalasis - upper lid   Conjunctiva/Sclera White and quiet Stable resolution of Injection and focal nodule; white and quiet   Cornea 1-2+inferior Punctate epithelial erosions, Temporal Well healed cataract wounds, Debris in tear film, mild arcus 1+Punctate epithelial erosions   Anterior Chamber Deep and quiet Deep, fine pigment   Iris Round and dilated Round and dilated   Lens PC IOL in good position, 1+ PCO SN PC IOL in good position, 1-2+Posterior capsular opacification   Vitreous Vitreous syneresis, mild pigment in anterior vitreous, Posterior vitreous detachment; scattered vitreous debris Post vitrectomy, 1+pigment         Fundus Exam       Right Left   Disc Pink and Sharp, Compact Sharp rim, mild tilt, trace temporal pallor, temporal Peripapillary atrophy, Compact, temporal PPP / PPA   C/D Ratio 0.2 0.3   Macula Flat, Good foveal reflex, Retinal pigment epithelial mottling, ERM, No heme or edema Flat, good foveal reflex, +epiretinal membrane, mild Retinal pigment epithelial mottling, trace residual Cystic changes nasal to fovea--improved from prior   Vessels attenuated, mild tortuousity attenuated, mild tortuousity   Periphery Attached over scleral buckle, good buckle height, good scarring over buckle Retina attached over buckle; good buckle height; good laser surrounding buckle w/ new row posterior to buckle; +fibrosis  at 0300 over buckle; retinotomy from 0300-0430 with good laser surrounding           Refraction     Wearing Rx       Sphere Cylinder Axis   Right -1.50 +0.75 017   Left -2.00 Sphere      Type: SVL            IMAGING AND PROCEDURES  Imaging and Procedures for '@TODAY' @  OCT, Retina - OU - Both Eyes       Right Eye Quality was good. Central Foveal Thickness: 293. Progression has been stable. Findings include normal foveal contour, no IRF, no SRF, epiretinal membrane (Stable ERM).   Left Eye Quality was good. Central Foveal Thickness: 276. Progression has improved. Findings include epiretinal membrane, normal foveal contour, no SRF, intraretinal fluid (Mild interval improvement in IRF nasal parafovea; Peristent ERM.).   Notes *Images captured and stored on drive  Diagnosis / Impression:  OD: mild ERM; NFP; no IRF/SRF-stable from prior OS: +CME--Mild interval improvement in IRF nasal parafovea; Peristent ERM.  Clinical management:  See below  Abbreviations: NFP - Normal foveal profile. CME - cystoid macular edema. PED - pigment epithelial detachment. IRF - intraretinal fluid. SRF - subretinal fluid. EZ - ellipsoid zone. ERM - epiretinal membrane. ORA - outer retinal atrophy. ORT - outer retinal tubulation. SRHM - subretinal hyper-reflective material               ASSESSMENT/PLAN:    ICD-10-CM   1. Left retinal detachment  H33.22     2. Traction detachment of left retina  H33.42     3. 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 CME (cystoid macular edema), left  H35.352     6. History of retinal detachment  Z86.69     7. Pseudophakia of both eyes  Z96.1      1-3. Retinal detachment, OS             - originally: macula-sparing inferior retinal detachment from 3-6 oclock             - large HST at 0500 and 2 small tears at 300 within the detached retina             - s/p SBP + PPV/PFC/EL/FAX/14% C3F8 OS, 10.07.19              - progressive fibrosis/PVR just posterior to buckle around 0400 with +SRF tracking posterior to buckle -- focal tractional/PVR detachment OS             - s/p  ENI/DP/OEU/[id number]/VQ/[id number] silicon oil OS, 1.95.0932  - s/p 67T PPV with silicon oil removal (24.58.09)  - retina attached and in good position -- good buckle height and laser over buckle and around retinotomy             - IOP okay at 16 today  - BCVA 20/50 -- stable   4. Nodular scleritis OS -- improved  - history of recurrent episodes following the tapering of meds (PO pred and topical PF/Prolensa)  - sup temporal quadrant -- was 3.5x4.0 mm nodule with surrounding inflammation -- now stably resolved             - limited lab work to rule out rheumatologic cause--all results negative   CBC, CMP   HIV   HLA Panel   ANA   ANCA  ACE, Lysozyme   RF   ESR, CRP  - following with Dr. Helane Rima -- last visit on 10.28.21.    - currently on Durezol BID per Dr. Helane Rima  5. CME OS   - interval increase / redevelopment of cystic changes OS on 12.09.21  - mild post-op CME -- recurrent  - s/p sub-tenons kenalog (03.13.20)  - OCT today mild interval improvement in IRF/cystic changes OS  - BCVA stable at 20/50  - discussed possible need for low dose maintenance therapy to keep CME away  - continue drop schedule as above per Dr. Helane Rima -- Durezol BID OS - Pt sees Dr. Helane Rima again 08/2021  - f/u here in 6 mos w/DFE/OCT  6. History of retinal detachment OD  - S/P SBP OD 11/26/14 -- Dr. Tempie Hoist  - looks great -- retina in great position  - monitor  7. Pseudophakia OU  - s/p CE/IOL OU ([place])  - doing well  - monitor  Ophthalmic Meds Ordered this visit:  No orders of the defined types were placed in this encounter.     Return in about 6 months (around 11/04/2021) for 6 mo f/u for CME OS w/DFE/OCT.  There are no Patient Instructions on file for this visit.  Explained the diagnoses, plan, and follow up with the patient and they expressed understanding.  Patient expressed understanding of the importance of proper follow up care.   This document serves as a record of services  personally performed by Gardiner Sleeper, MD, PhD. It was created on their behalf by Estill Bakes, COT an ophthalmic technician. The creation of this record is the provider's dictation and/or activities during the visit.    Electronically signed by: Estill Bakes, COT 6.13.22 @ 1:24 AM   This document serves as a record of services personally performed by Gardiner Sleeper, MD, PhD. It was created on their behalf by Estill Bakes, COT an ophthalmic technician. The creation of this record is the provider's dictation and/or activities during the visit.    Electronically signed by: Estill Bakes, COT 6.21.22 @ 1:24 AM   Gardiner Sleeper, M.D., Ph.D. Diseases & Surgery of the Retina and [place] 6.21.22  I have reviewed the above documentation for accuracy and completeness, and I agree with the above. Gardiner Sleeper, M.D., Ph.D. 05/07/21 1:24 AM  Abbreviations: M myopia (nearsighted); A astigmatism; H hyperopia (farsighted); P presbyopia; Mrx spectacle prescription;  CTL contact lenses; OD right eye; OS left eye; OU both eyes  XT exotropia; ET esotropia; PEK punctate epithelial keratitis; PEE punctate epithelial erosions; DES dry eye syndrome; MGD meibomian gland dysfunction; ATs artificial tears; PFAT's preservative free artificial tears; [place] nuclear sclerotic cataract; PSC posterior subcapsular cataract; ERM epi-retinal membrane; PVD posterior vitreous detachment; RD retinal detachment; DM diabetes mellitus; DR diabetic retinopathy; NPDR non-proliferative diabetic retinopathy; PDR proliferative diabetic retinopathy; CSME clinically significant macular edema; DME diabetic macular edema; dbh dot blot hemorrhages; CWS cotton wool spot; POAG primary open angle glaucoma; C/D cup-to-disc ratio; HVF humphrey visual field; GVF goldmann visual field; OCT optical coherence tomography; IOP intraocular pressure; BRVO Branch retinal vein occlusion; CRVO central retinal vein  occlusion; CRAO central retinal artery occlusion; BRAO branch retinal artery occlusion; RT retinal tear; SB scleral buckle; PPV pars plana vitrectomy; VH Vitreous hemorrhage; PRP panretinal laser photocoagulation; IVK intravitreal kenalog; VMT vitreomacular traction; MH Macular hole;  NVD neovascularization of the disc; NVE neovascularization elsewhere; AREDS age related eye disease study; ARMD age related  macular degeneration; POAG primary open angle glaucoma; EBMD epithelial/anterior basement membrane dystrophy; ACIOL anterior chamber intraocular lens; IOL intraocular lens; PCIOL posterior chamber intraocular lens; Phaco/IOL phacoemulsification with intraocular lens placement; [place] photorefractive keratectomy; LASIK laser assisted in situ keratomileusis; HTN hypertension; DM diabetes mellitus; COPD chronic obstructive pulmonary disease

## 2021-05-05 ENCOUNTER — Encounter (INDEPENDENT_AMBULATORY_CARE_PROVIDER_SITE_OTHER): Payer: Self-pay | Admitting: Ophthalmology

## 2021-05-05 ENCOUNTER — Ambulatory Visit (INDEPENDENT_AMBULATORY_CARE_PROVIDER_SITE_OTHER): Payer: Medicare Other | Admitting: Ophthalmology

## 2021-05-05 ENCOUNTER — Other Ambulatory Visit: Payer: Self-pay

## 2021-05-05 DIAGNOSIS — H35352 Cystoid macular degeneration, left eye: Secondary | ICD-10-CM

## 2021-05-05 DIAGNOSIS — H3342 Traction detachment of retina, left eye: Secondary | ICD-10-CM | POA: Diagnosis not present

## 2021-05-05 DIAGNOSIS — H3581 Retinal edema: Secondary | ICD-10-CM

## 2021-05-05 DIAGNOSIS — H3322 Serous retinal detachment, left eye: Secondary | ICD-10-CM

## 2021-05-05 DIAGNOSIS — Z8669 Personal history of other diseases of the nervous system and sense organs: Secondary | ICD-10-CM

## 2021-05-05 DIAGNOSIS — H15092 Other scleritis, left eye: Secondary | ICD-10-CM

## 2021-05-05 DIAGNOSIS — Z961 Presence of intraocular lens: Secondary | ICD-10-CM

## 2021-05-07 ENCOUNTER — Encounter (INDEPENDENT_AMBULATORY_CARE_PROVIDER_SITE_OTHER): Payer: Self-pay | Admitting: Ophthalmology

## 2021-07-02 ENCOUNTER — Ambulatory Visit (HOSPITAL_COMMUNITY): Admission: EM | Admit: 2021-07-02 | Discharge: 2021-07-02 | Payer: Medicare Other

## 2021-07-02 ENCOUNTER — Emergency Department (HOSPITAL_COMMUNITY)
Admission: EM | Admit: 2021-07-02 | Discharge: 2021-07-03 | Disposition: A | Discharge status: Home / Self Care | Payer: Medicare Other | Attending: Emergency Medicine | Admitting: Emergency Medicine

## 2021-07-02 ENCOUNTER — Encounter (HOSPITAL_COMMUNITY): Payer: Self-pay | Admitting: Emergency Medicine

## 2021-07-02 ENCOUNTER — Emergency Department (HOSPITAL_COMMUNITY): Payer: Medicare Other

## 2021-07-02 ENCOUNTER — Other Ambulatory Visit: Payer: Self-pay

## 2021-07-02 DIAGNOSIS — R531 Weakness: Secondary | ICD-10-CM | POA: Insufficient documentation

## 2021-07-02 DIAGNOSIS — Z20822 Contact with and (suspected) exposure to covid-19: Secondary | ICD-10-CM | POA: Insufficient documentation

## 2021-07-02 DIAGNOSIS — R0989 Other specified symptoms and signs involving the circulatory and respiratory systems: Secondary | ICD-10-CM | POA: Diagnosis not present

## 2021-07-02 DIAGNOSIS — E119 Type 2 diabetes mellitus without complications: Secondary | ICD-10-CM | POA: Insufficient documentation

## 2021-07-02 DIAGNOSIS — J069 Acute upper respiratory infection, unspecified: Secondary | ICD-10-CM

## 2021-07-02 DIAGNOSIS — Z87891 Personal history of nicotine dependence: Secondary | ICD-10-CM | POA: Diagnosis not present

## 2021-07-02 DIAGNOSIS — R059 Cough, unspecified: Secondary | ICD-10-CM | POA: Insufficient documentation

## 2021-07-02 DIAGNOSIS — Z7982 Long term (current) use of aspirin: Secondary | ICD-10-CM | POA: Insufficient documentation

## 2021-07-02 LAB — CBC WITH DIFFERENTIAL/PLATELET
Abs Immature Granulocytes: 0.09 10*3/uL — ABNORMAL HIGH (ref 0.00–0.07)
Basophils Absolute: 0 10*3/uL (ref 0.0–0.1)
Basophils Relative: 0 %
Eosinophils Absolute: 0 10*3/uL (ref 0.0–0.5)
Eosinophils Relative: 0 %
HCT: 39.6 % (ref 36.0–46.0)
Hemoglobin: 12.9 g/dL (ref 12.0–15.0)
Immature Granulocytes: 1 %
Lymphocytes Relative: 13 %
Lymphs Abs: 2.2 10*3/uL (ref 0.7–4.0)
MCH: 29.5 pg (ref 26.0–34.0)
MCHC: 32.6 g/dL (ref 30.0–36.0)
MCV: 90.4 fL (ref 80.0–100.0)
Monocytes Absolute: 1 10*3/uL (ref 0.1–1.0)
Monocytes Relative: 6 %
Neutro Abs: 13.3 10*3/uL — ABNORMAL HIGH (ref 1.7–7.7)
Neutrophils Relative %: 80 %
Platelets: 230 10*3/uL (ref 150–400)
RBC: 4.38 MIL/uL (ref 3.87–5.11)
RDW: 12.6 % (ref 11.5–15.5)
WBC: 16.7 10*3/uL — ABNORMAL HIGH (ref 4.0–10.5)
nRBC: 0 % (ref 0.0–0.2)

## 2021-07-02 LAB — COMPREHENSIVE METABOLIC PANEL
ALT: 23 U/L (ref 0–44)
AST: 22 U/L (ref 15–41)
Albumin: 3.6 g/dL (ref 3.5–5.0)
Alkaline Phosphatase: 72 U/L (ref 38–126)
Anion gap: 11 (ref 5–15)
BUN: 18 mg/dL (ref 8–23)
CO2: 24 mmol/L (ref 22–32)
Calcium: 9.4 mg/dL (ref 8.9–10.3)
Chloride: 96 mmol/L — ABNORMAL LOW (ref 98–111)
Creatinine, Ser: 0.69 mg/dL (ref 0.44–1.00)
GFR, Estimated: >60 mL/min (ref >60)
Glucose, Bld: 170 mg/dL — ABNORMAL HIGH (ref 70–99)
Potassium: 3.9 mmol/L (ref 3.5–5.1)
Sodium: 131 mmol/L — ABNORMAL LOW (ref 135–145)
Total Bilirubin: 1.1 mg/dL (ref 0.3–1.2)
Total Protein: 6.9 g/dL (ref 6.5–8.1)

## 2021-07-02 LAB — RESP PANEL BY RT-PCR (FLU A&B, COVID) ARPGX2
Influenza A by PCR: NEGATIVE
Influenza B by PCR: NEGATIVE
SARS Coronavirus 2 by RT PCR: NEGATIVE

## 2021-07-02 LAB — CBG MONITORING, ED: Glucose-Capillary: 159 mg/dL — ABNORMAL HIGH (ref 70–99)

## 2021-07-02 NOTE — ED Provider Notes (Signed)
Emergency Medicine Provider Triage Evaluation Note  Carla May , a 80 y.o. female  was evaluated in triage.  Pt complains of cough, nasal congestion, decreased appetite, nausea, vomiting, and fatigue.  Symptoms started on Monday.  Patient reports taking at home COVID test which was negative.  Patient went to urgent care earlier today and was sent here for further evaluation.  Patient has been vaccinated and received booster for COVID-19.  Review of Systems  Positive: Productive cough, nasal congestion, decreased appetite, nausea, vomiting, fatigue Negative: Fever, chills, abdominal pain, dysuria, hematuria, urinary frequency, shortness of breath  Physical Exam  BP (!) 152/77 (BP Location: Right Arm)   Pulse 85   Temp 98 F (36.7 C) (Oral)   Resp 16   LMP  (LMP Unknown)   SpO2 97%  Gen:   Awake, no distress   Resp:  Normal effort, lungs clear to auscultation bilaterally MSK:   Moves extremities without difficulty  Other:  Abdomen soft, nondistended, nontender.  Medical Decision Making  Medically screening exam initiated at 8:34 PM.  Appropriate orders placed.  Carla May was informed that the remainder of the evaluation will be completed by another provider, this initial triage assessment does not replace that evaluation, and the importance of remaining in the ED until their evaluation is complete.  The patient appears stable so that the remainder of the work up may be completed by another provider.      Haskel Schroeder, PA-C 07/02/21 2036    Franne Forts, DO 07/03/21 0023

## 2021-07-02 NOTE — ED Notes (Signed)
Patient is being discharged from the Urgent Care and sent to the Emergency Department via personal vehicle with family . Per Provider Ignacia Bayley, patient is in need of higher level of care due to possible dehydration and body weakness. Patient is aware and verbalizes understanding of plan of care.   Vitals:   07/02/21 1944  BP: (!) 159/64  Pulse: 83  Resp: 18  Temp: 98 F (36.7 C)  SpO2: 97%

## 2021-07-02 NOTE — ED Triage Notes (Signed)
Patient here with cough, congestions since Monday, no appetite.  She took home covid test that was negative.  She is feeling weak, no energy, vomiting.

## 2021-07-02 NOTE — ED Triage Notes (Signed)
Pt has been having nausea, no appetitive, cough, congestion x 4 days. Pt took home test and negative. Pt feels very weak and no energy.

## 2021-07-02 NOTE — Discharge Instructions (Addendum)
-  Please head straight to the emergency department for further evaluation and management.  I am concerned that you are dehydrated and require IV fluids, which must be performed in the emergency department.  Please head straight there, if symptoms get worse on the way stop and call 911.

## 2021-07-02 NOTE — ED Provider Notes (Signed)
[facility]    CSN: [id number] Arrival date & time: 07/02/21  1905      History   Chief Complaint Chief Complaint  Patient presents with   Nausea   Nasal Congestion   Cough    HPI Carla May is a 80 y.o. female presenting with viral symptoms for 4 days.  Medical history diabetes, GERD, arthritis, anxiety, prediabetes.  Here today with daughter.  Notes nausea with vomiting, cough, congestion, decreased appetite for 4 days.  2 episodes of vomiting today but states she cannot keep any fluids down.  States she feels generalized weakness and that she needs IV fluids.  Denies abd pain, diarrhea. Cough is hacking and nonproductive. Denies chest pain, shortness of breath, dizziness, focal weakness.  Negative home COVID test on day 3 of symptoms. She is vaccinated for covid.  HPI  Past Medical History:  Diagnosis Date   Anxiety    Arthritis    back. neck   Diabetes mellitus without complication (HCC)    GERD (gastroesophageal reflux disease)    History of palpitations    HOH (hard of hearing) 06/28/2019   Hyperlipidemia    diet controlled and fish oil   MVP (mitral valve prolapse)    never has caused any problems per patient on 06/27/19   PONV (postoperative nausea and vomiting)    No problems in 08/2018; however has had previous problems   Pre-diabetes    diet controlled, no med   Retinal detachment    OU   SVD (spontaneous vaginal delivery)    x 2    Patient Active Problem List   Diagnosis Date Noted   HOH (hard of hearing) 06/28/2019   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 Vonna Kotyk   COLONOSCOPY     polyp   EYE SURGERY Bilateral    Cat Sx and RD  repair   GAS INSERTION Right 11/26/2014   Procedure: INSERTION OF GAS;  Surgeon: Sherrie George, MD;  Location: [facility] OR;  Service: Ophthalmology;  Laterality: Right;  C3F8   GAS/FLUID EXCHANGE Left 08/21/2018   Procedure: GAS/FLUID EXCHANGE;  Surgeon: Rennis Chris, MD;  Location: [facility] OR;  Service: Ophthalmology;  Laterality: Left;  C3F8   KNEE ARTHROSCOPY Left    LASER PHOTO ABLATION Left 06/28/2019   Procedure: Laser Photo Ablation;  Surgeon: Rennis Chris, MD;  Location: [facility] OR;  Service: Ophthalmology;  Laterality: Left;   PARS PLANA VITRECTOMY Left 06/28/2019   Procedure: PARS PLANA VITRECTOMY WITH 25 GAUGE;  Surgeon: Rennis Chris, MD;  Location: [facility] OR;  Service: Ophthalmology;  Laterality: Left;   PHOTOCOAGULATION WITH LASER Right 11/26/2014   Procedure: PHOTOCOAGULATION WITH LASER;  Surgeon: Sherrie George, MD;  Location: [facility] OR;  Service: Ophthalmology;  Laterality: Right;   PHOTOCOAGULATION WITH LASER Left 08/21/2018   Procedure: PHOTOCOAGULATION WITH LASER;  Surgeon: Rennis Chris, MD;  Location: [facility] OR;  Service: Ophthalmology;  Laterality: Left;   REPAIR OF COMPLEX TRACTION RETINAL DETACHMENT Left 12/06/2018   Procedure: REPAIR OF COMPLEX TRACTION RETINAL DETACHMENT, 25 gauge vitrectomy, endolaser photocoagulation, memebrane peel, perfluoron injection,  and silicone oil;  Surgeon: Rennis Chris, MD;  Location: [facility] OR;  Service: Ophthalmology;  Laterality: Left;  RETINAL DETACHMENT SURGERY Bilateral    SBP OD - Dr. Alan Mulder (1.12.16).  SBP - Dr. Rennis Chris (10.7.19)   SCLERAL BUCKLE Right 11/26/2014   Procedure: SCLERAL BUCKLE RIGHT EYE ;  Surgeon: Sherrie George, MD;  Location: [facility] OR;  Service: Ophthalmology;  Laterality: Right;   SCLERAL BUCKLE WITH POSSIBLE 25 GAUGE PARS PLANA VITRECTOMY Left 08/21/2018   Procedure: SCLERAL BUCKLE WITH 25 GAUGE PARS PLANA VITRECTOMY;  Surgeon: Rennis Chris, MD;  Location: [facility] OR;  Service: Ophthalmology;  Laterality: Left;   SILICON OIL REMOVAL Left  06/28/2019   Procedure: Silicon Oil Removal;  Surgeon: Rennis Chris, MD;  Location: [facility] OR;  Service: Ophthalmology;  Laterality: Left;   TONSILLECTOMY      OB History   No obstetric history on file.      Home Medications    Prior to Admission medications   Medication Sig Start Date End Date Taking? Authorizing Provider  acetaminophen (TYLENOL) 650 MG CR tablet Take 650 mg by mouth every 8 (eight) hours as needed for pain.    [provider]  aspirin EC 81 MG tablet Take 81 mg by mouth daily.    [provider]  bacitracin-polymyxin b (POLYSPORIN) ophthalmic ointment Place 1 application into the left eye at bedtime as needed. Patient not taking: Reported on 10/21/2020 07/06/19   Rennis Chris, MD  Bromfenac Sodium (PROLENSA) 0.07 % SOLN Place 1 drop into the left eye 4 (four) times daily. 10/21/20   Rennis Chris, MD  carboxymethylcellulose (REFRESH PLUS) 0.5 % SOLN Place 1 drop into both eyes 3 (three) times daily as needed (for dry eyes).     [provider]  dorzolamide-timolol (COSOPT) 22.3-6.8 MG/ML ophthalmic solution Place 1 drop into the left eye 2 (two) times daily. Patient not taking: Reported on 10/21/2020 05/18/19   Rennis Chris, MD  DUREZOL 0.05 % EMUL Place 1 drop into the left eye 3 (three) times daily. Patient not taking: Reported on 10/21/2020 05/18/19   Rennis Chris, MD  ibuprofen (ADVIL) 800 MG tablet Take 800 mg by mouth every 8 (eight) hours as needed. Patient not taking: Reported on 10/21/2020    [provider]  Multiple Vitamin (MULTIVITAMIN WITH MINERALS) TABS tablet Take 1 tablet by mouth daily. One-A-Day for Women 50+    [provider]  nabumetone (RELAFEN) 500 MG tablet Take 750 mg by mouth 3 (three) times daily. Patient not taking: Reported on 10/21/2020 06/11/19   [provider]  Omega-3 Fatty Acids (FISH OIL ULTRA) 1400 MG CAPS Take 1,400 mg by mouth daily.    [provider]  omeprazole (PRILOSEC OTC)  20 MG tablet Take 20 mg by mouth daily.    [provider]  predniSONE (DELTASONE) 20 MG tablet Take 3 tablets (60 mg total) by mouth daily. Patient not taking: Reported on 10/21/2020 08/16/19   Rennis Chris, MD    Family History Family History  Problem Relation Age of Onset   Cancer Mother    Heart failure Father     Social History Social History   Tobacco Use   Smoking status: Former    Packs/day: 0.50    Years: 10.00    Pack years: 5.00    Types: Cigarettes    Quit date: 1970    Years since quitting: 52.6   Smokeless tobacco: Never   Tobacco comments:    quit in 1970's  Vaping Use   Vaping Use: Never used  Substance Use Topics   Alcohol use:  No   Drug use: No     Allergies   Atorvastatin, Penicillins, and Sulfa antibiotics   Review of Systems Review of Systems  Constitutional:  Positive for chills and fatigue. Negative for appetite change and fever.  HENT:  Positive for congestion. Negative for ear pain, rhinorrhea, sinus pressure, sinus pain and sore throat.   Eyes:  Negative for redness and visual disturbance.  Respiratory:  Positive for cough. Negative for chest tightness, shortness of breath and wheezing.   Cardiovascular:  Negative for chest pain and palpitations.  Gastrointestinal:  Positive for nausea and vomiting. Negative for abdominal pain, constipation and diarrhea.  Genitourinary:  Negative for dysuria, frequency and urgency.  Musculoskeletal:  Negative for myalgias.  Neurological:  Positive for weakness. Negative for dizziness and headaches.  Psychiatric/Behavioral:  Negative for confusion.   All other systems reviewed and are negative.   Physical Exam Triage Vital Signs ED Triage Vitals  Enc Vitals Group     BP 07/02/21 1944 (!) 159/64     Pulse Rate 07/02/21 1944 83     Resp 07/02/21 1944 18     Temp 07/02/21 1944 98 F (36.7 C)     Temp Source 07/02/21 1944 Oral     SpO2 07/02/21 1944 97 %     Weight --      Height --       Head Circumference --      Peak Flow --      Pain Score 07/02/21 1946 0     Pain Loc --      Pain Edu? --      Excl. in GC? --    No data found.  Updated Vital Signs BP (!) 159/64 (BP Location: Right Arm)   Pulse 83   Temp 98 F (36.7 C) (Oral)   Resp 18   LMP  (LMP Unknown)   SpO2 97%   Visual Acuity Right Eye Distance:   Left Eye Distance:   Bilateral Distance:    Right Eye Near:   Left Eye Near:    Bilateral Near:     Physical Exam Vitals reviewed.  Constitutional:      General: She is not in acute distress.    Appearance: Normal appearance. She is ill-appearing. She is not diaphoretic.  HENT:     Head: Normocephalic and atraumatic.     Right Ear: Tympanic membrane, ear canal and external ear normal. No tenderness. No middle ear effusion. There is no impacted cerumen. Tympanic membrane is not perforated, erythematous, retracted or bulging.     Left Ear: Tympanic membrane, ear canal and external ear normal. No tenderness.  No middle ear effusion. There is no impacted cerumen. Tympanic membrane is not perforated, erythematous, retracted or bulging.     Nose: Nose normal. No congestion.     Mouth/Throat:     Mouth: Mucous membranes are moist.     Pharynx: Uvula midline. No oropharyngeal exudate or posterior oropharyngeal erythema.  Eyes:     Extraocular Movements: Extraocular movements intact.     Pupils: Pupils are equal, round, and reactive to light.  Cardiovascular:     Rate and Rhythm: Normal rate and regular rhythm.     Pulses:          Radial pulses are 2+ on the right side and 2+ on the left side.     Heart sounds: Normal heart sounds.  Pulmonary:     Effort: Pulmonary effort is normal.     Breath sounds:  Normal breath sounds. No decreased breath sounds, wheezing, rhonchi or rales.  Abdominal:     Palpations: Abdomen is soft.     Tenderness: There is no abdominal tenderness. There is no guarding or rebound.  Musculoskeletal:     Right lower leg: No edema.      Left lower leg: No edema.  Skin:    General: Skin is warm.     Capillary Refill: Capillary refill takes 2 to 3 seconds.     Comments: Poor skin turgor   Neurological:     General: No focal deficit present.     Mental Status: She is alert and oriented to person, place, and time.     Comments: CN 2-12 grossly intact, no focal deficit   Psychiatric:        Mood and Affect: Mood normal.        Behavior: Behavior normal.        Thought Content: Thought content normal.        Judgment: Judgment normal.     UC Treatments / Results  Labs (all labs ordered are listed, but only abnormal results are displayed) Labs Reviewed - No data to display  EKG   Radiology No results found.  Procedures Procedures (including critical care time)  Medications Ordered in UC Medications - No data to display  Initial Impression / Assessment and Plan / UC Course  I have reviewed the triage vital signs and the nursing notes.  Pertinent labs & imaging results that were available during my care of the patient were reviewed by me and considered in my medical decision making (see chart for details).     This patient is a very pleasant 80 y.o. year old female presenting with generalized weakness, viral symptoms. Suspect covid19. Clinically stable but appears dehydrated. No current SOB, CP, dizziness, focal weakness.  Negative home covid test day 3 symptoms Vaccinated for covid  ED via personal vehicle. Patient and daughter in agreement.   Final Clinical Impressions(s) / UC Diagnoses   Final diagnoses:  Weakness     Discharge Instructions      -Please head straight to the emergency department for further evaluation and management.  I am concerned that you are dehydrated and require IV fluids, which must be performed in the emergency department.  Please head straight there, if symptoms get worse on the way stop and call 911.     ED Prescriptions   None    PDMP not reviewed this  encounter.   Rhys MartiniGraham, Shervin Cypert E, PA-C 07/02/21 2041

## 2021-07-03 MED ORDER — AZITHROMYCIN 250 MG PO TABS: ORAL_TABLET | ORAL | 0 refills | Status: DC

## 2021-07-03 MED ORDER — SODIUM CHLORIDE 0.9 % IV BOLUS
1000.0000 mL | Freq: Once | INTRAVENOUS | Status: AC
  Administered 2021-07-03: 1000 mL via INTRAVENOUS

## 2021-07-03 MED ORDER — ONDANSETRON HCL 4 MG/2ML IJ SOLN
4.0000 mg | Freq: Once | INTRAMUSCULAR | Status: AC
  Administered 2021-07-03: 4 mg via INTRAVENOUS
  Filled 2021-07-03: qty 2

## 2021-07-03 NOTE — Discharge Instructions (Addendum)
Take over-the-counter medications as needed for symptom relief.  Drink plenty of fluids and get plenty of rest.  If symptoms are not improving in the next 2 to 3 days, fill the prescription for Zithromax you have been provided with this evening.

## 2021-07-03 NOTE — ED Provider Notes (Signed)
[facility] EMERGENCY DEPARTMENT Provider Note   CSN: [id number] Arrival date & time: 07/02/21  2017     History Chief Complaint  Patient presents with   Weakness    ORENE ABBASI is a 80 y.o. female.  Patient is a 80 year old female with past medical history of GERD, hyperlipidemia, arthritis.  Patient presenting today for evaluation of chest congestion, cough, and feeling generally unwell.  This is been ongoing for the past 4 days.  She denies chest pain or feeling short of breath.  She denies any known COVID exposures.  The history is provided by the patient.  Weakness Severity:  Moderate Onset quality:  Gradual Duration:  4 days Timing:  Constant Progression:  Worsening Chronicity:  New Context comment:  URI symptoms Relieved by:  Nothing Worsened by:  Nothing Ineffective treatments:  None tried     Past Medical History:  Diagnosis Date   Anxiety    Arthritis    back. neck   Diabetes mellitus without complication (HCC)    GERD (gastroesophageal reflux disease)    History of palpitations    HOH (hard of hearing) 06/28/2019   Hyperlipidemia    diet controlled and fish oil   MVP (mitral valve prolapse)    never has caused any problems per patient on 06/27/19   PONV (postoperative nausea and vomiting)    No problems in 08/2018; however has had previous problems   Pre-diabetes    diet controlled, no med   Retinal detachment    OU   SVD (spontaneous vaginal delivery)    x 2    Patient Active Problem List   Diagnosis Date Noted   HOH (hard of hearing) 06/28/2019   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 Vonna Kotyk   COLONOSCOPY     polyp    EYE SURGERY Bilateral    Cat Sx and RD repair   GAS INSERTION Right 11/26/2014   Procedure: INSERTION OF GAS;  Surgeon: Sherrie George, MD;  Location: [facility] OR;  Service: Ophthalmology;  Laterality: Right;  C3F8   GAS/FLUID EXCHANGE Left 08/21/2018   Procedure: GAS/FLUID EXCHANGE;  Surgeon: Rennis Chris, MD;  Location: [facility] OR;  Service: Ophthalmology;  Laterality: Left;  C3F8   KNEE ARTHROSCOPY Left    LASER PHOTO ABLATION Left 06/28/2019   Procedure: Laser Photo Ablation;  Surgeon: Rennis Chris, MD;  Location: [facility] OR;  Service: Ophthalmology;  Laterality: Left;   PARS PLANA VITRECTOMY Left 06/28/2019   Procedure: PARS PLANA VITRECTOMY WITH 25 GAUGE;  Surgeon: Rennis Chris, MD;  Location: [facility] OR;  Service: Ophthalmology;  Laterality: Left;   PHOTOCOAGULATION WITH LASER Right 11/26/2014   Procedure: PHOTOCOAGULATION WITH LASER;  Surgeon: Sherrie George, MD;  Location: [facility] OR;  Service: Ophthalmology;  Laterality: Right;   PHOTOCOAGULATION WITH LASER Left 08/21/2018   Procedure: PHOTOCOAGULATION WITH LASER;  Surgeon: Rennis Chris, MD;  Location: [facility] OR;  Service: Ophthalmology;  Laterality: Left;   REPAIR OF COMPLEX TRACTION RETINAL DETACHMENT Left 12/06/2018   Procedure: REPAIR OF COMPLEX TRACTION RETINAL DETACHMENT, 25 gauge vitrectomy, endolaser photocoagulation, memebrane peel, perfluoron injection,  and silicone oil;  Surgeon: Rennis Chris, MD;  Location: [facility] OR;  Service: Ophthalmology;  Laterality: Left;   RETINAL DETACHMENT SURGERY Bilateral    SBP OD - Dr. Alan Mulder (1.12.16).  SBP - Dr. Rennis Chris (10.7.19)   SCLERAL BUCKLE Right 11/26/2014   Procedure: SCLERAL BUCKLE RIGHT EYE ;  Surgeon: Sherrie George, MD;  Location: [facility] OR;  Service: Ophthalmology;  Laterality: Right;   SCLERAL BUCKLE WITH POSSIBLE 25 GAUGE PARS PLANA VITRECTOMY Left 08/21/2018   Procedure: SCLERAL BUCKLE WITH 25 GAUGE PARS PLANA VITRECTOMY;  Surgeon: Rennis Chris, MD;  Location: [facility] OR;  Service: Ophthalmology;   Laterality: Left;   SILICON OIL REMOVAL Left 06/28/2019   Procedure: Silicon Oil Removal;  Surgeon: Rennis Chris, MD;  Location: [facility] OR;  Service: Ophthalmology;  Laterality: Left;   TONSILLECTOMY       OB History   No obstetric history on file.     Family History  Problem Relation Age of Onset   Cancer Mother    Heart failure Father     Social History   Tobacco Use   Smoking status: Former    Packs/day: 0.50    Years: 10.00    Pack years: 5.00    Types: Cigarettes    Quit date: 1970    Years since quitting: 52.6   Smokeless tobacco: Never   Tobacco comments:    quit in 1970's  Vaping Use   Vaping Use: Never used  Substance Use Topics   Alcohol use: No   Drug use: No    Home Medications Prior to Admission medications   Medication Sig Start Date End Date Taking? Authorizing Provider  acetaminophen (TYLENOL) 650 MG CR tablet Take 650 mg by mouth daily.   Yes [provider]  aspirin EC 81 MG tablet Take 81 mg by mouth daily.   Yes [provider]  Bromfenac Sodium (PROLENSA) 0.07 % SOLN Place 1 drop into the left eye daily. 12/25/20  Yes [provider]  icosapent Ethyl (VASCEPA) 1 g capsule Take 2 g by mouth 2 (two) times daily. 06/25/21  Yes [provider]  Multiple Vitamin (MULTIVITAMIN WITH MINERALS) TABS tablet Take 1 tablet by mouth daily. One-A-Day for Women 50+   Yes [provider]  omeprazole (PRILOSEC OTC) 20 MG tablet Take 20 mg by mouth daily.   Yes [provider]  prednisoLONE acetate (PRED FORTE) 1 % ophthalmic suspension Place 1 drop into the left eye in the morning and at bedtime.   Yes [provider]  bacitracin-polymyxin b (POLYSPORIN) ophthalmic ointment Place 1 application into the left eye at bedtime as needed. Patient not taking: No sig reported 07/06/19   Rennis Chris, MD  Bromfenac Sodium (PROLENSA) 0.07 % SOLN Place 1 drop into the left eye 4 (four) times daily. Patient not taking:  Reported on 07/03/2021 10/21/20   Rennis Chris, MD  carboxymethylcellulose (REFRESH PLUS) 0.5 % SOLN Place 1 drop into both eyes 4 (four) times daily as needed (for dry eyes).    [provider]  dorzolamide-timolol (COSOPT) 22.3-6.8 MG/ML ophthalmic solution Place 1 drop into the left eye 2 (two) times daily. Patient not taking: No sig reported 05/18/19   Rennis Chris, MD  DUREZOL 0.05 % EMUL Place 1 drop into the left eye 3 (three) times daily. Patient not taking: No sig reported 05/18/19   Rennis Chris, MD  ibuprofen (ADVIL) 800 MG tablet Take 800 mg by mouth every 8 (eight) hours as needed. Patient not taking: No sig reported    [provider]  nabumetone (RELAFEN) 500  MG tablet Take 750 mg by mouth 3 (three) times daily. Patient not taking: No sig reported 06/11/19   [provider]  predniSONE (DELTASONE) 20 MG tablet Take 3 tablets (60 mg total) by mouth daily. Patient not taking: No sig reported 08/16/19   Rennis Chris, MD    Allergies    Atorvastatin, Penicillins, and Sulfa antibiotics  Review of Systems   Review of Systems  Neurological:  Positive for weakness.  All other systems reviewed and are negative.  Physical Exam Updated Vital Signs BP (!) 123/58 (BP Location: Right Arm)   Pulse 77   Temp 98.4 F (36.9 C) (Oral)   Resp 18   LMP  (LMP Unknown)   SpO2 95%   Physical Exam Vitals and nursing note reviewed.  Constitutional:      General: She is not in acute distress.    Appearance: She is well-developed. She is not diaphoretic.  HENT:     Head: Normocephalic and atraumatic.  Cardiovascular:     Rate and Rhythm: Normal rate and regular rhythm.     Heart sounds: No murmur heard.   No friction rub. No gallop.  Pulmonary:     Effort: Pulmonary effort is normal. No respiratory distress.     Breath sounds: Normal breath sounds. No wheezing.  Abdominal:     General: Bowel sounds are normal. There is no distension.     Palpations: Abdomen  is soft.     Tenderness: There is no abdominal tenderness.  Musculoskeletal:        General: Normal range of motion.     Cervical back: Normal range of motion and neck supple.  Skin:    General: Skin is warm and dry.  Neurological:     General: No focal deficit present.     Mental Status: She is alert and oriented to person, place, and time.    ED Results / Procedures / Treatments   Labs (all labs ordered are listed, but only abnormal results are displayed) Labs Reviewed  CBC WITH DIFFERENTIAL/PLATELET - Abnormal; Notable for the following components:      Result Value   WBC 16.7 (*)    Neutro Abs 13.3 (*)    Abs Immature Granulocytes 0.09 (*)    All other components within normal limits  COMPREHENSIVE METABOLIC PANEL - Abnormal; Notable for the following components:   Sodium 131 (*)    Chloride 96 (*)    Glucose, Bld 170 (*)    All other components within normal limits  CBG MONITORING, ED - Abnormal; Notable for the following components:   Glucose-Capillary 159 (*)    All other components within normal limits  RESP PANEL BY RT-PCR (FLU A&B, COVID) [id number]    EKG None  Radiology DG Chest Portable 1 View  Result Date: 07/02/2021 CLINICAL DATA:  Cough and congestion for several days, initial encounter EXAM: PORTABLE CHEST 1 VIEW COMPARISON:  01/22/2013 FINDINGS: Cardiac shadow is within normal limits. The lungs are well aerated bilaterally. Mild bibasilar atelectasis is seen. No focal confluent infiltrate is noted. No bony abnormality is seen. IMPRESSION: Mild bibasilar atelectasis. Electronically Signed   By: Alcide Clever M.D.   On: 07/02/2021 21:00    Procedures Procedures   Medications Ordered in ED Medications  sodium chloride 0.9 % bolus 1,000 mL (has no administration in time range)  ondansetron (ZOFRAN) injection 4 mg (has no administration in time range)    ED Course  I have reviewed the triage vital signs and  the nursing notes.  Pertinent labs & imaging  results that were available during my care of the patient were reviewed by me and considered in my medical decision making (see chart for details).    MDM Rules/Calculators/A&P  Patient presenting with complaints of URI symptoms for the past week.  She feels dehydrated and rundown.  Vital signs are stable and chest x-ray shows no definitive infiltrate and COVID and flu are negative.  There is no hypoxia.  Patient given IV fluids here in the ER and seems appropriate for discharge.  Patient will be prescribed Zithromax which she can fill if her symptoms are not improving in the next 2 to 3 days.  She is to continue over-the-counter medications as needed.  Final Clinical Impression(s) / ED Diagnoses Final diagnoses:  None    Rx / DC Orders ED Discharge Orders     None        Geoffery Lyonselo, Octave [place]rose, MD 07/03/21 [phone]

## 2021-10-22 NOTE — Progress Notes (Signed)
[facility] Note  11/03/2021     CHIEF COMPLAINT Patient presents for Retina Follow Up    HISTORY OF PRESENT ILLNESS: Carla May is a 79 y.o. female who presents to the clinic today for:   HPI     Retina Follow Up   Patient presents with  Other.  In left eye.  Duration of 6 months.  I, the attending physician,  performed the HPI with the patient and updated documentation appropriately.        Comments   6 month follow up CME OS- When reading she will miss words that are centrally OD>OS x for awhile now.  She keeps thinking it will improve but it doesn't.  Distance vision appears stable.   Prednisolone QID OS (had cataract sx OS 09/22/2021, Dr. Talbert Forest)      Last edited by Bernarda Caffey, MD on 11/03/2021  9:42 PM.    Pt saw Dr. Helane Rima in October and also had YAG cap OS on 11.08.22 with Dr. Talbert Forest, pt states prior to YAG, she was on PF BID OS per Dr. Helane Rima, afterwards, it was increased to QID OS per Dr. Talbert Forest, pt missed her post op appt with Dr. Talbert Forest, so she is still using PF QID OS, she is not using Prolensa   Referring physician: Gwendalyn Ege, [place],  [place] [address]  HISTORICAL INFORMATION:   Selected notes from the MEDICAL RECORD NUMBER Referred by Dr. Len Blalock for concern of retinal hole.   CURRENT MEDICATIONS: Current Outpatient Medications (Ophthalmic Drugs)  Medication Sig   prednisoLONE acetate (PRED FORTE) 1 % ophthalmic suspension Place 1 drop into the left eye in the morning and at bedtime.   bacitracin-polymyxin b (POLYSPORIN) ophthalmic ointment Place 1 application into the left eye at bedtime as needed. (Patient not taking: Reported on 10/21/2020)   Bromfenac Sodium (PROLENSA) 0.07 % SOLN Place 1 drop into the left eye daily. (Patient not taking: Reported on 11/03/2021)   Bromfenac Sodium (PROLENSA) 0.07 % SOLN Place 1 drop into the left eye 4 (four) times daily.   carboxymethylcellulose (REFRESH  PLUS) 0.5 % SOLN Place 1 drop into both eyes 4 (four) times daily as needed (for dry eyes). (Patient not taking: Reported on 11/03/2021)   dorzolamide-timolol (COSOPT) 22.3-6.8 MG/ML ophthalmic solution Place 1 drop into the left eye 2 (two) times daily. (Patient not taking: Reported on 10/21/2020)   DUREZOL 0.05 % EMUL Place 1 drop into the left eye 3 (three) times daily. (Patient not taking: Reported on 10/21/2020)   No current facility-administered medications for this visit. (Ophthalmic Drugs)   Current Outpatient Medications (Other)  Medication Sig   acetaminophen (TYLENOL) 650 MG CR tablet Take 650 mg by mouth daily.   aspirin EC 81 MG tablet Take 81 mg by mouth daily.   icosapent Ethyl (VASCEPA) 1 g capsule Take 2 g by mouth 2 (two) times daily.   Multiple Vitamin (MULTIVITAMIN WITH MINERALS) TABS tablet Take 1 tablet by mouth daily. One-A-Day for Women 50+   omeprazole (PRILOSEC OTC) 20 MG tablet Take 20 mg by mouth daily.   azithromycin (ZITHROMAX Z-PAK) 250 MG tablet 2 po day one, then 1 daily x 4 days (Patient not taking: Reported on 11/03/2021)   ibuprofen (ADVIL) 800 MG tablet Take 800 mg by mouth every 8 (eight) hours as needed. (Patient not taking: Reported on 10/21/2020)   nabumetone (RELAFEN) 500 MG tablet Take 750 mg by  mouth 3 (three) times daily. (Patient not taking: Reported on 10/21/2020)   predniSONE (DELTASONE) 20 MG tablet Take 3 tablets (60 mg total) by mouth daily. (Patient not taking: No sig reported)   Current Facility-Administered Medications (Other)  Medication Route   triamcinolone acetonide (KENALOG-40) injection 4 mg Intravitreal   REVIEW OF SYSTEMS: ROS   Positive for: Gastrointestinal, Musculoskeletal, Endocrine, Cardiovascular, Eyes Negative for: Constitutional, Neurological, Skin, Genitourinary, HENT, Respiratory, Psychiatric, Allergic/Imm, Heme/Lymph Last edited by Leonie Douglas, COA on 11/03/2021  9:34 AM.     ALLERGIES Allergies  Allergen Reactions    Atorvastatin Other (See Comments)    Muscle aches/ memory loss   Penicillins Itching, Nausea Only and Other (See Comments)    Did it involve swelling of the face/tongue/throat, SOB, or low BP? Unknown Did it involve sudden or severe rash/hives, skin peeling, or any reaction on the inside of your mouth or nose? Itching Did you need to seek medical attention at a hospital or doctor's office? No When did it last happen? 1970s If all above answers are "NO", may proceed with cephalosporin use.    Sulfa Antibiotics Nausea Only   PAST MEDICAL HISTORY Past Medical History:  Diagnosis Date   Anxiety    Arthritis    back. neck   Diabetes mellitus without complication (HCC)    GERD (gastroesophageal reflux disease)    History of palpitations    HOH (hard of hearing) 06/28/2019   Hyperlipidemia    diet controlled and fish oil   MVP (mitral valve prolapse)    never has caused any problems per patient on 06/27/19   PONV (postoperative nausea and vomiting)    No problems in 08/2018; however has had previous problems   Pre-diabetes    diet controlled, no med   Retinal detachment    OU   SVD (spontaneous vaginal delivery)    x 2   Past Surgical History:  Procedure Laterality Date   ABDOMINAL HYSTERECTOMY  1970's   AIR/FLUID EXCHANGE Left 06/28/2019   Procedure: Air/Fluid Exchange;  Surgeon: Bernarda Caffey, MD;  Location: [place];  Service: Ophthalmology;  Laterality: Left;   CATARACT EXTRACTION Bilateral    Dr. Talbert Forest   COLONOSCOPY     polyp   EYE SURGERY Bilateral    Cat Sx and RD repair   GAS INSERTION Right 11/26/2014   Procedure: INSERTION OF GAS;  Surgeon: Hayden Pedro, MD;  Location: [place];  Service: Ophthalmology;  Laterality: Right;  C3F8   GAS/FLUID EXCHANGE Left 08/21/2018   Procedure: GAS/FLUID EXCHANGE;  Surgeon: Bernarda Caffey, MD;  Location: [place];  Service: Ophthalmology;  Laterality: Left;  C3F8   KNEE ARTHROSCOPY Left    LASER PHOTO ABLATION Left 06/28/2019   Procedure:  Laser Photo Ablation;  Surgeon: Bernarda Caffey, MD;  Location: [place];  Service: Ophthalmology;  Laterality: Left;   PARS PLANA VITRECTOMY Left 06/28/2019   Procedure: PARS PLANA VITRECTOMY WITH 25 GAUGE;  Surgeon: Bernarda Caffey, MD;  Location: [place];  Service: Ophthalmology;  Laterality: Left;   PHOTOCOAGULATION WITH LASER Right 11/26/2014   Procedure: PHOTOCOAGULATION WITH LASER;  Surgeon: Hayden Pedro, MD;  Location: [place];  Service: Ophthalmology;  Laterality: Right;   PHOTOCOAGULATION WITH LASER Left 08/21/2018   Procedure: PHOTOCOAGULATION WITH LASER;  Surgeon: Bernarda Caffey, MD;  Location: [place];  Service: Ophthalmology;  Laterality: Left;   REPAIR OF COMPLEX TRACTION RETINAL DETACHMENT Left 12/06/2018   Procedure: REPAIR OF COMPLEX TRACTION RETINAL DETACHMENT, 25 gauge vitrectomy, endolaser photocoagulation,  memebrane peel, perfluoron injection,  and silicone oil;  Surgeon: Bernarda Caffey, MD;  Location: [place];  Service: Ophthalmology;  Laterality: Left;   RETINAL DETACHMENT SURGERY Bilateral    SBP OD - Dr. Tempie Hoist (1.12.16).  SBP - Dr. Bernarda Caffey (10.7.19)   SCLERAL BUCKLE Right 11/26/2014   Procedure: SCLERAL BUCKLE RIGHT EYE ;  Surgeon: Hayden Pedro, MD;  Location: [place];  Service: Ophthalmology;  Laterality: Right;   SCLERAL BUCKLE WITH POSSIBLE 25 GAUGE PARS PLANA VITRECTOMY Left 08/21/2018   Procedure: SCLERAL BUCKLE WITH 25 GAUGE PARS PLANA VITRECTOMY;  Surgeon: Bernarda Caffey, MD;  Location: [place];  Service: Ophthalmology;  Laterality: Left;   SILICON OIL REMOVAL Left 4/78/2956   Procedure: Silicon Oil Removal;  Surgeon: Bernarda Caffey, MD;  Location: [place];  Service: Ophthalmology;  Laterality: Left;   TONSILLECTOMY     FAMILY HISTORY Family History  Problem Relation Age of Onset   Cancer Mother    Heart failure Father    SOCIAL HISTORY Social History   Tobacco Use   Smoking status: Former    Packs/day: 0.50    Years: 10.00    Pack years: 5.00    Types:  Cigarettes    Quit date: 1970    Years since quitting: 53.0   Smokeless tobacco: Never   Tobacco comments:    quit in 1970's  Vaping Use   Vaping Use: Never used  Substance Use Topics   Alcohol use: No   Drug use: No       OPHTHALMIC EXAM:  Base Eye Exam     Visual Acuity (Snellen - Linear)       Right Left   Dist cc 20/40 20/150   Dist ph cc NI 20/60    Correction: Glasses         Tonometry (Tonopen, 9:49 AM)       Right Left   Pressure 13 20         Pupils       Dark Light Shape React APD   Right 3 2 Round Minimal None   Left 3 2 Round Minimal None         Visual Fields (Counting fingers)       Left Right    Full Full         Extraocular Movement       Right Left    Full Full         Neuro/Psych     Oriented x3: Yes   Mood/Affect: Normal         Dilation     Both eyes: 1.0% Mydriacyl, 2.5% Phenylephrine @ 9:49 AM           Slit Lamp and Fundus Exam     Slit Lamp Exam       Right Left   Lids/Lashes Dermatochalasis - upper lid, mild MGD Dermatochalasis - upper lid   Conjunctiva/Sclera White and quiet Stable resolution of Injection and focal nodule; white and quiet   Cornea 1-2+inferior Punctate epithelial erosions, Temporal Well healed cataract wounds, Debris in tear film, mild arcus Trace Punctate epithelial erosions, well healed cataract wound   Anterior Chamber Deep and quiet Deep, 2+fine pigment   Iris Round and dilated Round and dilated   Lens PC IOL in good position, 1+ PCO SN PC IOL in good position with open PC   Anterior Vitreous Vitreous syneresis, mild pigment in anterior vitreous, Posterior vitreous detachment; scattered vitreous debris Post vitrectomy, 1+pigment  Fundus Exam       Right Left   Disc Pink and Sharp, Compact Sharp rim, mild tilt, trace temporal pallor, temporal Peripapillary atrophy, Compac   C/D Ratio 0.2 0.3   Macula Flat, Good foveal reflex, Retinal pigment epithelial mottling,  ERM, No heme or edema Flat, good foveal reflex, +epiretinal membrane, mild Retinal pigment epithelial mottling, trace residual Cystic changes - slightly increased   Vessels attenuated, mild tortuousity attenuated, mild tortuousity   Periphery Attached over scleral buckle, good buckle height, good scarring over buckle Retina attached over buckle; good buckle height; good laser surrounding buckle w/ new row posterior to buckle; +fibrosis at 0300 over buckle; retinotomy from 0300-0430 with good laser surrounding           Refraction     Wearing Rx       Sphere Cylinder Axis   Right -1.50 +0.75 017   Left -2.00 Sphere     Type: SVL         Manifest Refraction       Sphere Cylinder Axis Dist VA   Right -2.00 +0.75 015 20/40+2   Left -2.50 +0.50 135 20/50           IMAGING AND PROCEDURES  Imaging and Procedures for '@TODAY' @  OCT, Retina - OU - Both Eyes       Right Eye Quality was good. Central Foveal Thickness: 287. Progression has been stable. Findings include normal foveal contour, no IRF, no SRF, epiretinal membrane (Stable ERM).   Left Eye Quality was good. Central Foveal Thickness: 277. Progression has worsened. Findings include epiretinal membrane, normal foveal contour, no SRF, intraretinal fluid (Mild interval increase in IRF parafovea; Peristent ERM.).   Notes *Images captured and stored on drive  Diagnosis / Impression:  OD: mild ERM; NFP; no IRF/SRF-stable from prior OS: +CME--Mild interval increase in IRF parafovea; Peristent ERM.  Clinical management:  See below  Abbreviations: NFP - Normal foveal profile. CME - cystoid macular edema. PED - pigment epithelial detachment. IRF - intraretinal fluid. SRF - subretinal fluid. EZ - ellipsoid zone. ERM - epiretinal membrane. ORA - outer retinal atrophy. ORT - outer retinal tubulation. SRHM - subretinal hyper-reflective material            ASSESSMENT/PLAN:    ICD-10-CM   1. CME (cystoid macular edema),  left  H35.352 OCT, Retina - OU - Both Eyes    2. Left retinal detachment  H33.22     3. 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 History of retinal detachment  Z86.69     6. Pseudophakia of both eyes  Z96.1       1. CME OS   - history of recurrent post-op CME - s/p sub-tenons kenalog (03.13.20)  - interval increase / redevelopment of cystic changes OS on 12.09.21  - OCT today mild interval increase in IRF/cystic changes OS  - BCVA decreased to 20/60 from 20/50  - continue PF QID and re-start Prolensa QID OS  - discussed possible need for low dose maintenance therapy to keep CME away - Pt sees Dr. Helane Rima again 12/2021  - f/u here in 4 weeks w/DFE/OCT  2,3. Retinal detachment, OS             - originally: macula-sparing inferior retinal detachment from 3-6 oclock             - large HST at 0500  and 2 small tears at 300 within the detached retina             - s/p SBP + PPV/PFC/EL/FAX/14% C3F8 OS, 10.07.19 - progressive fibrosis/PVR just posterior to buckle around 0400 with +SRF tracking posterior to buckle -- focal tractional/PVR detachment OS             - s/p GXQ/JJ/HER/[id number] silicon oil OS, 2.63.7858  - s/p 85O PPV with silicon oil removal (27.74.12)  - retina attached and in good position -- good buckle height and laser over buckle and around retinotomy             - IOP okay at 20 today  - BCVA decreased to 20/60 from 20/50   4. Nodular scleritis OS -- quiescent  - history of recurrent episodes following the tapering of meds (PO pred and topical PF/Prolensa)  - sup temporal quadrant -- was 3.5x4.0 mm nodule with surrounding inflammation -- now stably resolved             - limited lab work to rule out rheumatologic cause--all results negative   CBC, CMP   HIV   HLA Panel   ANA   ANCA   ACE, Lysozyme   RF   ESR, CRP  - following with Dr. Helane Rima -- last visit on 10.27.22  5. History of retinal  detachment OD  - S/P SBP OD 11/26/14 -- Dr. Tempie Hoist  - looks great -- retina in great position  - monitor  6. Pseudophakia OU  - s/p CE/IOL OU ([place])  - doing well  - monitor  Ophthalmic Meds Ordered this visit:  Meds ordered this encounter  Medications   Bromfenac Sodium (PROLENSA) 0.07 % SOLN    Sig: Place 1 drop into the left eye 4 (four) times daily.    Dispense:  3 mL    Refill:  3     Return in about 4 weeks (around 12/01/2021) for f/u CME OS, DFE, OCT.  There are no Patient Instructions on file for this visit.  Explained the diagnoses, plan, and follow up with the patient and they expressed understanding.  Patient expressed understanding of the importance of proper follow up care.   This document serves as a record of services personally performed by Gardiner Sleeper, MD, PhD. It was created on their behalf by Estill Bakes, COT an ophthalmic technician. The creation of this record is the provider's dictation and/or activities during the visit.    Electronically signed by: Estill Bakes, COT 12.8.22 @ 9:57 PM   This document serves as a record of services personally performed by Gardiner Sleeper, MD, PhD. It was created on their behalf by San Jetty. Owens Shark, OA an ophthalmic technician. The creation of this record is the provider's dictation and/or activities during the visit.    Electronically signed by: San Jetty. Marguerita Merles 12.20.2022 9:57 PM  Gardiner Sleeper, M.D., Ph.D. Diseases & Surgery of the Retina and Vitreous [facility]  I have reviewed the above documentation for accuracy and completeness, and I agree with the above. Gardiner Sleeper, M.D., Ph.D. 11/03/21 9:57 PM   Abbreviations: M myopia (nearsighted); A astigmatism; H hyperopia (farsighted); P presbyopia; Mrx spectacle prescription;  CTL contact lenses; OD right eye; OS left eye; OU both eyes  XT exotropia; ET esotropia; PEK punctate epithelial keratitis; PEE punctate epithelial  erosions; DES dry eye syndrome; MGD meibomian gland dysfunction; ATs artificial tears; PFAT's preservative free artificial tears; [place]  nuclear sclerotic cataract; PSC posterior subcapsular cataract; ERM epi-retinal membrane; PVD posterior vitreous detachment; RD retinal detachment; DM diabetes mellitus; DR diabetic retinopathy; NPDR non-proliferative diabetic retinopathy; PDR proliferative diabetic retinopathy; CSME clinically significant macular edema; DME diabetic macular edema; dbh dot blot hemorrhages; CWS cotton wool spot; POAG primary open angle glaucoma; C/D cup-to-disc ratio; HVF humphrey visual field; GVF goldmann visual field; OCT optical coherence tomography; IOP intraocular pressure; BRVO Branch retinal vein occlusion; CRVO central retinal vein occlusion; CRAO central retinal artery occlusion; BRAO branch retinal artery occlusion; RT retinal tear; SB scleral buckle; PPV pars plana vitrectomy; VH Vitreous hemorrhage; PRP panretinal laser photocoagulation; IVK intravitreal kenalog; VMT vitreomacular traction; MH Macular hole;  NVD neovascularization of the disc; NVE neovascularization elsewhere; AREDS age related eye disease study; ARMD age related macular degeneration; POAG primary open angle glaucoma; EBMD epithelial/anterior basement membrane dystrophy; ACIOL anterior chamber intraocular lens; IOL intraocular lens; PCIOL posterior chamber intraocular lens; Phaco/IOL phacoemulsification with intraocular lens placement; [place] photorefractive keratectomy; LASIK laser assisted in situ keratomileusis; HTN hypertension; DM diabetes mellitus; COPD chronic obstructive pulmonary disease

## 2021-11-03 ENCOUNTER — Other Ambulatory Visit: Payer: Self-pay

## 2021-11-03 ENCOUNTER — Encounter (INDEPENDENT_AMBULATORY_CARE_PROVIDER_SITE_OTHER): Payer: Self-pay | Admitting: Ophthalmology

## 2021-11-03 ENCOUNTER — Ambulatory Visit (INDEPENDENT_AMBULATORY_CARE_PROVIDER_SITE_OTHER): Payer: Medicare Other | Admitting: Ophthalmology

## 2021-11-03 DIAGNOSIS — H3322 Serous retinal detachment, left eye: Secondary | ICD-10-CM | POA: Diagnosis not present

## 2021-11-03 DIAGNOSIS — Z8669 Personal history of other diseases of the nervous system and sense organs: Secondary | ICD-10-CM

## 2021-11-03 DIAGNOSIS — H3581 Retinal edema: Secondary | ICD-10-CM

## 2021-11-03 DIAGNOSIS — Z961 Presence of intraocular lens: Secondary | ICD-10-CM

## 2021-11-03 DIAGNOSIS — H15092 Other scleritis, left eye: Secondary | ICD-10-CM

## 2021-11-03 DIAGNOSIS — H40052 Ocular hypertension, left eye: Secondary | ICD-10-CM

## 2021-11-03 DIAGNOSIS — H3342 Traction detachment of retina, left eye: Secondary | ICD-10-CM

## 2021-11-03 DIAGNOSIS — H35352 Cystoid macular degeneration, left eye: Secondary | ICD-10-CM

## 2021-11-03 MED ORDER — PROLENSA 0.07 % OP SOLN: 1.0000 [drp] | Freq: Four times a day (QID) | OPHTHALMIC | 3 refills | Status: DC

## 2021-11-30 NOTE — Progress Notes (Signed)
[facility] Note  12/01/2021     CHIEF COMPLAINT Patient presents for Retina Follow Up    HISTORY OF PRESENT ILLNESS: Carla May is a 81 y.o. female who presents to the clinic today for:   HPI     Retina Follow Up   Patient presents with  Other.  In left eye.  This started years ago.  Severity is moderate.  Duration of 4 weeks.  Since onset it is stable.  I, the attending physician,  performed the HPI with the patient and updated documentation appropriately.        Comments   81 y/o female pt here for 4 wk f/u for CME OS.  No change in [place] OU noticed.  Denies pain, FOL, floaters.  PF and Prolensa QID OS.      Last edited by Bernarda Caffey, MD on 12/01/2021 12:44 PM.    Pt states she is keeping up with the drops as best she can, she has an appt with Dr. Helane Rima next month, she recently had yag OS and is scheduled for OD next week   Referring physician: Gwendalyn Ege, [place],  [place] [address]  HISTORICAL INFORMATION:   Selected notes from the MEDICAL RECORD NUMBER Referred by Dr. Len Blalock for concern of retinal hole.   CURRENT MEDICATIONS: Current Outpatient Medications (Ophthalmic Drugs)  Medication Sig   Bromfenac Sodium (PROLENSA) 0.07 % SOLN Place 1 drop into the left eye 4 (four) times daily.   carboxymethylcellulose (REFRESH PLUS) 0.5 % SOLN Place 1 drop into both eyes 4 (four) times daily as needed (for dry eyes).   bacitracin-polymyxin b (POLYSPORIN) ophthalmic ointment Place 1 application into the left eye at bedtime as needed. (Patient not taking: Reported on 10/21/2020)   Bromfenac Sodium (PROLENSA) 0.07 % SOLN Place 1 drop into the left eye daily. (Patient not taking: Reported on 11/03/2021)   Bromfenac Sodium (PROLENSA) 0.07 % SOLN 1 drop into affected eye (Patient not taking: Reported on 12/01/2021)   dorzolamide-timolol (COSOPT) 22.3-6.8 MG/ML ophthalmic solution Place 1 drop into the left eye 2 (two)  times daily. (Patient not taking: Reported on 10/21/2020)   DUREZOL 0.05 % EMUL Place 1 drop into the left eye 3 (three) times daily. (Patient not taking: Reported on 10/21/2020)   prednisoLONE acetate (PRED FORTE) 1 % ophthalmic suspension Place 1 drop into the left eye 4 (four) times daily.   No current facility-administered medications for this visit. (Ophthalmic Drugs)   Current Outpatient Medications (Other)  Medication Sig   acetaminophen (TYLENOL) 650 MG CR tablet Take 650 mg by mouth daily.   aspirin 81 MG chewable tablet 1 tablet   aspirin EC 81 MG tablet Take 81 mg by mouth daily.   icosapent Ethyl (VASCEPA) 1 g capsule Take 2 g by mouth 2 (two) times daily.   Multiple Vitamin (MULTIVITAMIN WITH MINERALS) TABS tablet Take 1 tablet by mouth daily. One-A-Day for Women 50+   omeprazole (PRILOSEC OTC) 20 MG tablet Take 20 mg by mouth daily.   azithromycin (ZITHROMAX Z-PAK) 250 MG tablet 2 po day one, then 1 daily x 4 days (Patient not taking: Reported on 11/03/2021)   ibuprofen (ADVIL) 800 MG tablet Take 800 mg by mouth every 8 (eight) hours as needed. (Patient not taking: Reported on 10/21/2020)   nabumetone (RELAFEN) 500 MG tablet Take 750 mg by mouth 3 (three) times daily. (Patient not taking: Reported on 10/21/2020)  predniSONE (DELTASONE) 20 MG tablet Take 3 tablets (60 mg total) by mouth daily. (Patient not taking: No sig reported)   Current Facility-Administered Medications (Other)  Medication Route   triamcinolone acetonide (KENALOG-40) injection 4 mg Intravitreal   REVIEW OF SYSTEMS: ROS   Positive for: Gastrointestinal, Neurological, Musculoskeletal, HENT, Eyes Negative for: Constitutional, Skin, Genitourinary, Endocrine, Cardiovascular, Respiratory, Psychiatric, Allergic/Imm, Heme/Lymph Last edited by Matthew Folks, COA on 12/01/2021  9:24 AM.      ALLERGIES Allergies  Allergen Reactions   Atorvastatin Other (See Comments)    Muscle aches/ memory loss   Penicillins  Itching, Nausea Only and Other (See Comments)    Did it involve swelling of the face/tongue/throat, SOB, or low BP? Unknown Did it involve sudden or severe rash/hives, skin peeling, or any reaction on the inside of your mouth or nose? Itching Did you need to seek medical attention at a hospital or doctor's office? No When did it last happen? 1970s If all above answers are NO, may proceed with cephalosporin use.    Sulfa Antibiotics Nausea Only   PAST MEDICAL HISTORY Past Medical History:  Diagnosis Date   Anxiety    Arthritis    back. neck   Diabetes mellitus without complication (HCC)    GERD (gastroesophageal reflux disease)    History of palpitations    HOH (hard of hearing) 06/28/2019   Hyperlipidemia    diet controlled and fish oil   MVP (mitral valve prolapse)    never has caused any problems per patient on 06/27/19   PONV (postoperative nausea and vomiting)    No problems in 08/2018; however has had previous problems   Pre-diabetes    diet controlled, no med   Retinal detachment    OU   SVD (spontaneous vaginal delivery)    x 2   Past Surgical History:  Procedure Laterality Date   ABDOMINAL HYSTERECTOMY  1970's   AIR/FLUID EXCHANGE Left 06/28/2019   Procedure: Air/Fluid Exchange;  Surgeon: Bernarda Caffey, MD;  Location: [facility];  Service: Ophthalmology;  Laterality: Left;   CATARACT EXTRACTION Bilateral    Dr. Talbert Forest   COLONOSCOPY     polyp   EYE SURGERY Bilateral    Cat Sx and RD repair   GAS INSERTION Right 11/26/2014   Procedure: INSERTION OF GAS;  Surgeon: Hayden Pedro, MD;  Location: [facility];  Service: Ophthalmology;  Laterality: Right;  C3F8   GAS/FLUID EXCHANGE Left 08/21/2018   Procedure: GAS/FLUID EXCHANGE;  Surgeon: Bernarda Caffey, MD;  Location: [facility];  Service: Ophthalmology;  Laterality: Left;  C3F8   KNEE ARTHROSCOPY Left    LASER PHOTO ABLATION Left 06/28/2019   Procedure: Laser Photo Ablation;  Surgeon: Bernarda Caffey, MD;  Location: [facility];  Service:  Ophthalmology;  Laterality: Left;   PARS PLANA VITRECTOMY Left 06/28/2019   Procedure: PARS PLANA VITRECTOMY WITH 25 GAUGE;  Surgeon: Bernarda Caffey, MD;  Location: [facility];  Service: Ophthalmology;  Laterality: Left;   PHOTOCOAGULATION WITH LASER Right 11/26/2014   Procedure: PHOTOCOAGULATION WITH LASER;  Surgeon: Hayden Pedro, MD;  Location: [facility];  Service: Ophthalmology;  Laterality: Right;   PHOTOCOAGULATION WITH LASER Left 08/21/2018   Procedure: PHOTOCOAGULATION WITH LASER;  Surgeon: Bernarda Caffey, MD;  Location: [facility];  Service: Ophthalmology;  Laterality: Left;   REPAIR OF COMPLEX TRACTION RETINAL DETACHMENT Left 12/06/2018   Procedure: REPAIR OF COMPLEX TRACTION RETINAL DETACHMENT, 25 gauge vitrectomy, endolaser photocoagulation, memebrane peel, perfluoron injection,  and silicone oil;  Surgeon: Coralyn Pear,  Aaron Edelman, MD;  Location: [facility];  Service: Ophthalmology;  Laterality: Left;   RETINAL DETACHMENT SURGERY Bilateral    SBP OD - Dr. Tempie Hoist (1.12.16).  SBP - Dr. Bernarda Caffey (10.7.19)   SCLERAL BUCKLE Right 11/26/2014   Procedure: SCLERAL BUCKLE RIGHT EYE ;  Surgeon: Hayden Pedro, MD;  Location: [facility];  Service: Ophthalmology;  Laterality: Right;   SCLERAL BUCKLE WITH POSSIBLE 25 GAUGE PARS PLANA VITRECTOMY Left 08/21/2018   Procedure: SCLERAL BUCKLE WITH 25 GAUGE PARS PLANA VITRECTOMY;  Surgeon: Bernarda Caffey, MD;  Location: [facility];  Service: Ophthalmology;  Laterality: Left;   SILICON OIL REMOVAL Left 2/63/3354   Procedure: Silicon Oil Removal;  Surgeon: Bernarda Caffey, MD;  Location: [facility];  Service: Ophthalmology;  Laterality: Left;   TONSILLECTOMY     FAMILY HISTORY Family History  Problem Relation Age of Onset   Cancer Mother    Heart failure Father    SOCIAL HISTORY Social History   Tobacco Use   Smoking status: Former    Packs/day: 0.50    Years: 10.00    Pack years: 5.00    Types: Cigarettes    Quit date: 1970    Years since quitting: 53.0   Smokeless tobacco:  Never   Tobacco comments:    quit in 1970's  Vaping Use   Vaping Use: Never used  Substance Use Topics   Alcohol use: No   Drug use: No       OPHTHALMIC EXAM:  Base Eye Exam     Visual Acuity (Snellen - Linear)       Right Left   Dist cc 20/40 20/40 -   Dist ph cc NI NI    Correction: Glasses         Tonometry (Tonopen, 9:26 AM)       Right Left   Pressure 14 15         Pupils       Dark Light Shape React APD   Right 3 2 Round Minimal None   Left 3 2 Round Minimal None         Visual Fields (Counting fingers)       Left Right    Full Full         Extraocular Movement       Right Left    Full, Ortho Full, Ortho         Neuro/Psych     Oriented x3: Yes   Mood/Affect: Normal         Dilation     Both eyes: 1.0% Mydriacyl, 2.5% Phenylephrine @ 9:26 AM           Slit Lamp and Fundus Exam     Slit Lamp Exam       Right Left   Lids/Lashes Dermatochalasis - upper lid, mild MGD Dermatochalasis - upper lid   Conjunctiva/Sclera White and quiet Stable resolution of Injection and focal nodule; white and quiet   Cornea 1-2+inferior Punctate epithelial erosions, Temporal Well healed cataract wounds, Debris in tear film, mild arcus Trace Punctate epithelial erosions, well healed cataract wound   Anterior Chamber Deep and quiet Deep, 2-3+fine pigment   Iris Round and dilated Round and moderately dilated   Lens PC IOL in good position, 1+ PCO SN PC IOL in good position with open PC   Anterior Vitreous Vitreous syneresis, mild pigment in anterior vitreous, Posterior vitreous detachment; scattered vitreous debris Post vitrectomy, 1+pigment  Fundus Exam       Right Left   Disc Pink and Sharp, Compact Sharp rim, mild tilt, trace temporal pallor, temporal Peripapillary atrophy, Compact   C/D Ratio 0.2 0.3   Macula Flat, Good foveal reflex, Retinal pigment epithelial mottling, ERM, No heme or edema Flat, good foveal reflex, +epiretinal  membrane, mild Retinal pigment epithelial mottling, trace residual Cystic changes - slightly improved from prior   Vessels attenuated, mild tortuousity mild attenuation, mild tortuousity   Periphery Attached over scleral buckle, good buckle height, good scarring over buckle Retina attached over buckle; good buckle height; good laser surrounding buckle w/ new row posterior to buckle; +fibrosis at 0300 over buckle; retinotomy from 0300-0430 with good laser surrounding           IMAGING AND PROCEDURES  Imaging and Procedures for '@TODAY' @  OCT, Retina - OU - Both Eyes       Right Eye Quality was good. Central Foveal Thickness: 289. Progression has been stable. Findings include normal foveal contour, no IRF, no SRF, epiretinal membrane (Stable ERM).   Left Eye Quality was good. Central Foveal Thickness: 270. Progression has improved. Findings include epiretinal membrane, normal foveal contour, no SRF, intraretinal fluid (Mild interval improvement in perifoveal cystic changes; Peristent ERM.).   Notes *Images captured and stored on drive  Diagnosis / Impression:  OD: mild ERM; NFP; no IRF/SRF-stable from prior OS: +CME-- Mild interval improvement in perifoveal cystic changes; Peristent ERM.  Clinical management:  See below  Abbreviations: NFP - Normal foveal profile. CME - cystoid macular edema. PED - pigment epithelial detachment. IRF - intraretinal fluid. SRF - subretinal fluid. EZ - ellipsoid zone. ERM - epiretinal membrane. ORA - outer retinal atrophy. ORT - outer retinal tubulation. SRHM - subretinal hyper-reflective material             ASSESSMENT/PLAN:    ICD-10-CM   1. CME (cystoid macular edema), left  H35.352 OCT, Retina - OU - Both Eyes    2. Left retinal detachment  H33.22     3. 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 History of retinal detachment  Z86.69     6. Pseudophakia of both eyes  Z96.1         1. CME OS   - history of recurrent post-op CME - s/p sub-tenons kenalog (03.13.20)  - interval increase / redevelopment of cystic changes OS on 12.09.21 and more recently on 12.20.22 -- restarted Prolensa QID OS (was still on PF QID OS)  - OCT today mild interval improvement in IRF/cystic changes OS  - BCVA improved to 20/40 from 20/60  - continue PF QID and Prolensa QID OS for now -- can be adjusted by Dr. Helane Rima as needed next month  - discussed possible need for low dose maintenance therapy to keep CME away - Pt sees Dr. Helane Rima again 12/2021  - f/u here in 2-3 mos w/DFE/OCT  2,3. Retinal detachment, OS             - originally: macula-sparing inferior retinal detachment from 3-6 oclock             - large HST at 0500 and 2 small tears at 300 within the detached retina             - s/p SBP + PPV/PFC/EL/FAX/14% C3F8 OS, 10.07.19 - progressive fibrosis/PVR just posterior to buckle around 0400 with +SRF tracking  posterior to buckle -- focal tractional/PVR detachment OS             - s/p NTZ/GY/FVC/BSWHQPRFFM/BW/[id number] silicon oil OS, 3.57.0177  - s/p 93J PPV with silicon oil removal (03.00.92)  - retina attached and in good position -- good buckle height and laser over buckle and around retinotomy             - IOP okay at 20 today  - BCVA 20/40 from 20/60 as discussed above   4. Nodular scleritis OS -- quiescent  - history of recurrent episodes following the tapering of meds (PO pred and topical PF/Prolensa)  - sup temporal quadrant -- was 3.5x4.0 mm nodule with surrounding inflammation -- now stably resolved             - limited lab work to rule out rheumatologic cause--all results negative   CBC, CMP   HIV   HLA Panel   ANA   ANCA   ACE, Lysozyme   RF   ESR, CRP  - following with Dr. Helane Rima -- last visit on 10.27.22  5. History of retinal detachment OD  - S/P SBP OD 11/26/14 -- Dr. Tempie Hoist  - looks great -- retina in great position  - monitor  6. Pseudophakia  OU  - s/p CE/IOL OU ([place])  - doing well  - monitor  Ophthalmic Meds Ordered this visit:  Meds ordered this encounter  Medications   prednisoLONE acetate (PRED FORTE) 1 % ophthalmic suspension    Sig: Place 1 drop into the left eye 4 (four) times daily.    Dispense:  15 mL    Refill:  0     Return for f/u 2-3 months, CME OS, DFE, OCT.  There are no Patient Instructions on file for this visit.  Explained the diagnoses, plan, and follow up with the patient and they expressed understanding.  Patient expressed understanding of the importance of proper follow up care.   This document serves as a record of services personally performed by Gardiner Sleeper, MD, PhD. It was created on their behalf by Estill Bakes, COT an ophthalmic technician. The creation of this record is the provider's dictation and/or activities during the visit.    Electronically signed by: Estill Bakes, [place] 1.16.23 @ 12:45 PM   Gardiner Sleeper, M.D., Ph.D. Diseases & Surgery of the Retina and Vitreous [facility]  I have reviewed the above documentation for accuracy and completeness, and I agree with the above. Gardiner Sleeper, M.D., Ph.D. 12/01/21 12:48 PM   Abbreviations: M myopia (nearsighted); A astigmatism; H hyperopia (farsighted); P presbyopia; Mrx spectacle prescription;  CTL contact lenses; OD right eye; OS left eye; OU both eyes  XT exotropia; ET esotropia; PEK punctate epithelial keratitis; PEE punctate epithelial erosions; DES dry eye syndrome; MGD meibomian gland dysfunction; ATs artificial tears; PFAT's preservative free artificial tears; [place] nuclear sclerotic cataract; PSC posterior subcapsular cataract; ERM epi-retinal membrane; PVD posterior vitreous detachment; RD retinal detachment; DM diabetes mellitus; DR diabetic retinopathy; NPDR non-proliferative diabetic retinopathy; PDR proliferative diabetic retinopathy; CSME clinically significant macular edema; DME diabetic macular  edema; dbh dot blot hemorrhages; CWS cotton wool spot; POAG primary open angle glaucoma; C/D cup-to-disc ratio; HVF humphrey visual field; GVF goldmann visual field; OCT optical coherence tomography; IOP intraocular pressure; BRVO Branch retinal vein occlusion; CRVO central retinal vein occlusion; CRAO central retinal artery occlusion; BRAO branch retinal artery occlusion; RT retinal tear; SB scleral buckle; PPV pars plana vitrectomy;  VH Vitreous hemorrhage; PRP panretinal laser photocoagulation; IVK intravitreal kenalog; VMT vitreomacular traction; MH Macular hole;  NVD neovascularization of the disc; NVE neovascularization elsewhere; AREDS age related eye disease study; ARMD age related macular degeneration; POAG primary open angle glaucoma; EBMD epithelial/anterior basement membrane dystrophy; ACIOL anterior chamber intraocular lens; IOL intraocular lens; PCIOL posterior chamber intraocular lens; Phaco/IOL phacoemulsification with intraocular lens placement; [place] photorefractive keratectomy; LASIK laser assisted in situ keratomileusis; HTN hypertension; DM diabetes mellitus; COPD chronic obstructive pulmonary disease

## 2021-12-01 ENCOUNTER — Other Ambulatory Visit: Payer: Self-pay

## 2021-12-01 ENCOUNTER — Encounter (INDEPENDENT_AMBULATORY_CARE_PROVIDER_SITE_OTHER): Payer: Self-pay | Admitting: Ophthalmology

## 2021-12-01 ENCOUNTER — Ambulatory Visit (INDEPENDENT_AMBULATORY_CARE_PROVIDER_SITE_OTHER): Payer: Medicare Other | Admitting: Ophthalmology

## 2021-12-01 DIAGNOSIS — H35352 Cystoid macular degeneration, left eye: Secondary | ICD-10-CM | POA: Diagnosis not present

## 2021-12-01 DIAGNOSIS — H3342 Traction detachment of retina, left eye: Secondary | ICD-10-CM

## 2021-12-01 DIAGNOSIS — H15092 Other scleritis, left eye: Secondary | ICD-10-CM | POA: Diagnosis not present

## 2021-12-01 DIAGNOSIS — Z961 Presence of intraocular lens: Secondary | ICD-10-CM

## 2021-12-01 DIAGNOSIS — H3322 Serous retinal detachment, left eye: Secondary | ICD-10-CM

## 2021-12-01 DIAGNOSIS — Z8669 Personal history of other diseases of the nervous system and sense organs: Secondary | ICD-10-CM

## 2021-12-01 MED ORDER — PREDNISOLONE ACETATE 1 % OP SUSP: 1.0000 [drp] | Freq: Four times a day (QID) | OPHTHALMIC | 0 refills | Status: DC

## 2022-02-15 NOTE — Progress Notes (Signed)
?[facility] - Clinic Note ? ?02/16/2022 ? ?  ? ?CHIEF COMPLAINT ?Patient presents for Retina Follow Up ? ? ? ?HISTORY OF PRESENT ILLNESS: ?Carla May is a 81 y.o. female who presents to the clinic today for:  ? ?HPI   ? ? Retina Follow Up   ?Patient presents with  Other.  In left eye.  Duration of 2 months.  I, the attending physician,  performed the HPI with the patient and updated documentation appropriately. ? ?  ?  ? ? Comments   ?2 1/2 month follow up h/o CME OS- She will have a pain in the center of her eye OD followed with eye irritation.  Last about a 1/2 day.  OS will have a dull ache at times.  She sees shadows when outside, cloudy looking. Using Prednisolone BID OS and Refresh QID or more. ?She has neuropathy in feet.  Given Gabapentin to start but she wanted to make sure it was ok with Dr. Vanessa Barbara first.  ? ?  ?  ?Last edited by Rennis Chris, MD on 02/16/2022 12:09 PM.  ?  ?Seeing shadows and fogginess at times. Worse outside and in light.  Sunglasses helps.  At times she will wear the sunglasses inside.  Other times, vision is clear "as a bell" ? ?Referring physician: ?Maurice Small, MD ?[address] ?[address] ?[place],  [place] [address] ? ?HISTORICAL INFORMATION:  ? ?Selected notes from the MEDICAL RECORD NUMBER ?Referred by Dr. Ashok Cordia for concern of retinal hole.  ? ?CURRENT MEDICATIONS: ?Current Outpatient Medications (Ophthalmic Drugs)  ?Medication Sig  ? carboxymethylcellulose (REFRESH PLUS) 0.5 % SOLN Place 1 drop into both eyes 4 (four) times daily as needed (for dry eyes).  ? prednisoLONE acetate (PRED FORTE) 1 % ophthalmic suspension Place 1 drop into the left eye 4 (four) times daily.  ? bacitracin-polymyxin b (POLYSPORIN) ophthalmic ointment Place 1 application into the left eye at bedtime as needed. (Patient not taking: Reported on 10/21/2020)  ? Bromfenac Sodium (PROLENSA) 0.07 % SOLN Place 1 drop into the left eye daily. (Patient not taking: Reported on  11/03/2021)  ? Bromfenac Sodium (PROLENSA) 0.07 % SOLN Place 1 drop into the left eye 4 (four) times daily.  ? Bromfenac Sodium (PROLENSA) 0.07 % SOLN 1 drop into affected eye (Patient not taking: Reported on 12/01/2021)  ? dorzolamide-timolol (COSOPT) 22.3-6.8 MG/ML ophthalmic solution Place 1 drop into the left eye 2 (two) times daily. (Patient not taking: Reported on 10/21/2020)  ? DUREZOL 0.05 % EMUL Place 1 drop into the left eye 3 (three) times daily. (Patient not taking: Reported on 10/21/2020)  ? ?No current facility-administered medications for this visit. (Ophthalmic Drugs)  ? ?Current Outpatient Medications (Other)  ?Medication Sig  ? acetaminophen (TYLENOL) 650 MG CR tablet Take 650 mg by mouth daily.  ? aspirin 81 MG chewable tablet 1 tablet  ? icosapent Ethyl (VASCEPA) 1 g capsule Take 2 g by mouth 2 (two) times daily.  ? Multiple Vitamin (MULTIVITAMIN WITH MINERALS) TABS tablet Take 1 tablet by mouth daily. One-A-Day for Women 50+  ? omeprazole (PRILOSEC OTC) 20 MG tablet Take 20 mg by mouth daily.  ? aspirin EC 81 MG tablet Take 81 mg by mouth daily.  ? azithromycin (ZITHROMAX Z-PAK) 250 MG tablet 2 po day one, then 1 daily x 4 days (Patient not taking: Reported on 11/03/2021)  ? ibuprofen (ADVIL) 800 MG tablet Take 800 mg by mouth every 8 (eight) hours  as needed. (Patient not taking: Reported on 10/21/2020)  ? nabumetone (RELAFEN) 500 MG tablet Take 750 mg by mouth 3 (three) times daily. (Patient not taking: Reported on 10/21/2020)  ? predniSONE (DELTASONE) 20 MG tablet Take 3 tablets (60 mg total) by mouth daily. (Patient not taking: No sig reported)  ? ?Current Facility-Administered Medications (Other)  ?Medication Route  ? triamcinolone acetonide (KENALOG-40) injection 4 mg Intravitreal  ? ?REVIEW OF SYSTEMS: ?ROS   ?Positive for: Gastrointestinal, Neurological, Musculoskeletal, HENT, Eyes ?Negative for: Constitutional, Skin, Genitourinary, Endocrine, Cardiovascular, Respiratory, Psychiatric,  Allergic/Imm, Heme/Lymph ?Last edited by Joni ReiningHodges, Robin, COA on 02/16/2022  9:48 AM.  ?  ? ?ALLERGIES ?Allergies  ?Allergen Reactions  ? Atorvastatin Other (See Comments)  ?  Muscle aches/ memory loss  ? Penicillins Itching, Nausea Only and Other (See Comments)  ?  Did it involve swelling of the face/tongue/throat, SOB, or low BP? Unknown ?Did it involve sudden or severe rash/hives, skin peeling, or any reaction on the inside of your mouth or nose? Itching ?Did you need to seek medical attention at a hospital or doctor's office? No ?When did it last happen? 1970s ?If all above answers are ?NO?, may proceed with cephalosporin use. ?  ? Sulfa Antibiotics Nausea Only  ? ?PAST MEDICAL HISTORY ?Past Medical History:  ?Diagnosis Date  ? Anxiety   ? 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 Diabetes mellitus without complication (HCC)   ? GERD (gastroesophageal reflux disease)   ? History of palpitations   ? HOH (hard of hearing) 06/28/2019  ? Hyperlipidemia   ? diet controlled and fish oil  ? MVP (mitral valve prolapse)   ? never has caused any problems per patient on 06/27/19  ? PONV (postoperative nausea and vomiting)   ? No problems in 08/2018; however has had previous problems  ? Pre-diabetes   ? diet controlled, no med  ? 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 SVD (spontaneous vaginal delivery)   ? x 2  ? ?Past Surgical History:  ?Procedure Laterality Date  ? ABDOMINAL HYSTERECTOMY  1970's  ? AIR/FLUID EXCHANGE Left 06/28/2019  ? Procedure: Air/Fluid Exchange;  Surgeon: Rennis ChrisZamora, Perkins Molina, MD;  [facility];  Service: Ophthalmology;  Laterality: Left;  ? CATARACT EXTRACTION Bilateral   ? Dr. Vonna KotykBevis  ? COLONOSCOPY    ? polyp  ? EYE SURGERY Bilateral   ? Cat Sx and RD repair  ? GAS INSERTION Right 11/26/2014  ? Procedure: INSERTION OF GAS;  Surgeon: Sherrie GeorgeJohn D Matthews, MD;  [facility];  Service: Ophthalmology;  Laterality: Right;  C3F8  ? GAS/FLUID EXCHANGE Left 08/21/2018  ? Procedure: GAS/FLUID EXCHANGE;  Surgeon: Rennis ChrisZamora, Sharren Schnurr, MD;  [facility];  Service: Ophthalmology;  Laterality: Left;  C3F8  ? KNEE ARTHROSCOPY Left   ? LASER PHOTO ABLATION Left 06/28/2019  ? Procedure: Laser Photo Ablation;  Surgeon: Rennis ChrisZamora, Delaynee Alred, MD;  [facility];  Service: Ophthalmology;  Laterality: Left;  ? PARS PLANA VITRECTOMY Left 06/28/2019  ? Procedure: PARS PLANA VITRECTOMY WITH 25 GAUGE;  Surgeon: Rennis ChrisZamora, Ilaisaane Marts, MD;  [facility];  Service: Ophthalmology;  Laterality: Left;  ? PHOTOCOAGULATION WITH LASER Right 11/26/2014  ? Procedure: PHOTOCOAGULATION WITH LASER;  Surgeon: Sherrie GeorgeJohn D Matthews, MD;  [facility];  Service: Ophthalmology;  Laterality: Right;  ? PHOTOCOAGULATION WITH LASER Left 08/21/2018  ? Procedure: PHOTOCOAGULATION WITH LASER;  Surgeon: Rennis ChrisZamora, Kerstyn Coryell, MD;  [facility];  Service: Ophthalmology;  Laterality: Left;  ? REPAIR OF COMPLEX  TRACTION RETINAL DETACHMENT Left 12/06/2018  ? Procedure: REPAIR OF COMPLEX TRACTION RETINAL DETACHMENT, 25 gauge vitrectomy, endolaser photocoagulation, memebrane peel, perfluoron injection,  and silicone oil;  Surgeon: Rennis Chris, MD;  Location: [facility] OR;  Service: Ophthalmology;  Laterality: Left;  ? RETINAL DETACHMENT SURGERY Bilateral   ? SBP OD - Dr. Alan Mulder (1.12.16).  SBP - Dr. Rennis Chris (10.7.19)  ? SCLERAL BUCKLE Right 11/26/2014  ? Procedure: SCLERAL BUCKLE RIGHT EYE ;  Surgeon: Sherrie George, MD;  Location: [facility] OR;  Service: Ophthalmology;  Laterality: Right;  ? SCLERAL BUCKLE WITH POSSIBLE 25 GAUGE PARS PLANA VITRECTOMY Left 08/21/2018  ? Procedure: SCLERAL BUCKLE WITH 25 GAUGE PARS PLANA VITRECTOMY;  Surgeon: Rennis Chris, MD;  Location: [facility] OR;  Service: Ophthalmology;  Laterality: Left;  ? SILICON OIL REMOVAL Left 06/28/2019  ? Procedure: Silicon Oil Removal;  Surgeon: Rennis Chris, MD;  Location[facility] OR;  Service: Ophthalmology;  Laterality: Left;  ? TONSILLECTOMY    ? ?FAMILY HISTORY ?Family History  ?Problem Relation Age of Onset  ? Cancer Mother   ? Heart failure Father   ? ?SOCIAL  HISTORY ?Social History  ? ?Tobacco Use  ? Smoking status: Former  ?  Packs/day: 0.50  ?  Years: 10.00  ?  Pack years: 5.00  ?  Types: Cigarettes  ?  Quit date: 1970  ?  Years since quitting: 53.2  ? Smokeless tobacco: Never

## 2022-02-16 ENCOUNTER — Other Ambulatory Visit (INDEPENDENT_AMBULATORY_CARE_PROVIDER_SITE_OTHER): Payer: Self-pay

## 2022-02-16 ENCOUNTER — Ambulatory Visit (INDEPENDENT_AMBULATORY_CARE_PROVIDER_SITE_OTHER): Payer: Medicare Other | Admitting: Ophthalmology

## 2022-02-16 ENCOUNTER — Encounter (INDEPENDENT_AMBULATORY_CARE_PROVIDER_SITE_OTHER): Payer: Self-pay | Admitting: Ophthalmology

## 2022-02-16 ENCOUNTER — Telehealth (INDEPENDENT_AMBULATORY_CARE_PROVIDER_SITE_OTHER): Payer: Self-pay

## 2022-02-16 DIAGNOSIS — H3322 Serous retinal detachment, left eye: Secondary | ICD-10-CM | POA: Diagnosis not present

## 2022-02-16 DIAGNOSIS — H3342 Traction detachment of retina, left eye: Secondary | ICD-10-CM | POA: Diagnosis not present

## 2022-02-16 DIAGNOSIS — H35352 Cystoid macular degeneration, left eye: Secondary | ICD-10-CM

## 2022-02-16 DIAGNOSIS — H15092 Other scleritis, left eye: Secondary | ICD-10-CM

## 2022-02-16 DIAGNOSIS — H04123 Dry eye syndrome of bilateral lacrimal glands: Secondary | ICD-10-CM

## 2022-02-16 DIAGNOSIS — Z8669 Personal history of other diseases of the nervous system and sense organs: Secondary | ICD-10-CM

## 2022-02-16 DIAGNOSIS — Z961 Presence of intraocular lens: Secondary | ICD-10-CM

## 2022-02-16 MED ORDER — PROLENSA 0.07 % OP SOLN: 1.0000 [drp] | Freq: Four times a day (QID) | OPHTHALMIC | 3 refills | Status: DC

## 2022-03-09 IMAGING — DX DG CHEST 1V PORT
1 series · 1 of 1 positions shown · non-contrast
Comparison: 01/22/2013

CLINICAL DATA: Cough and congestion for several days, initial
encounter

EXAM:
PORTABLE CHEST 1 VIEW

[chest]
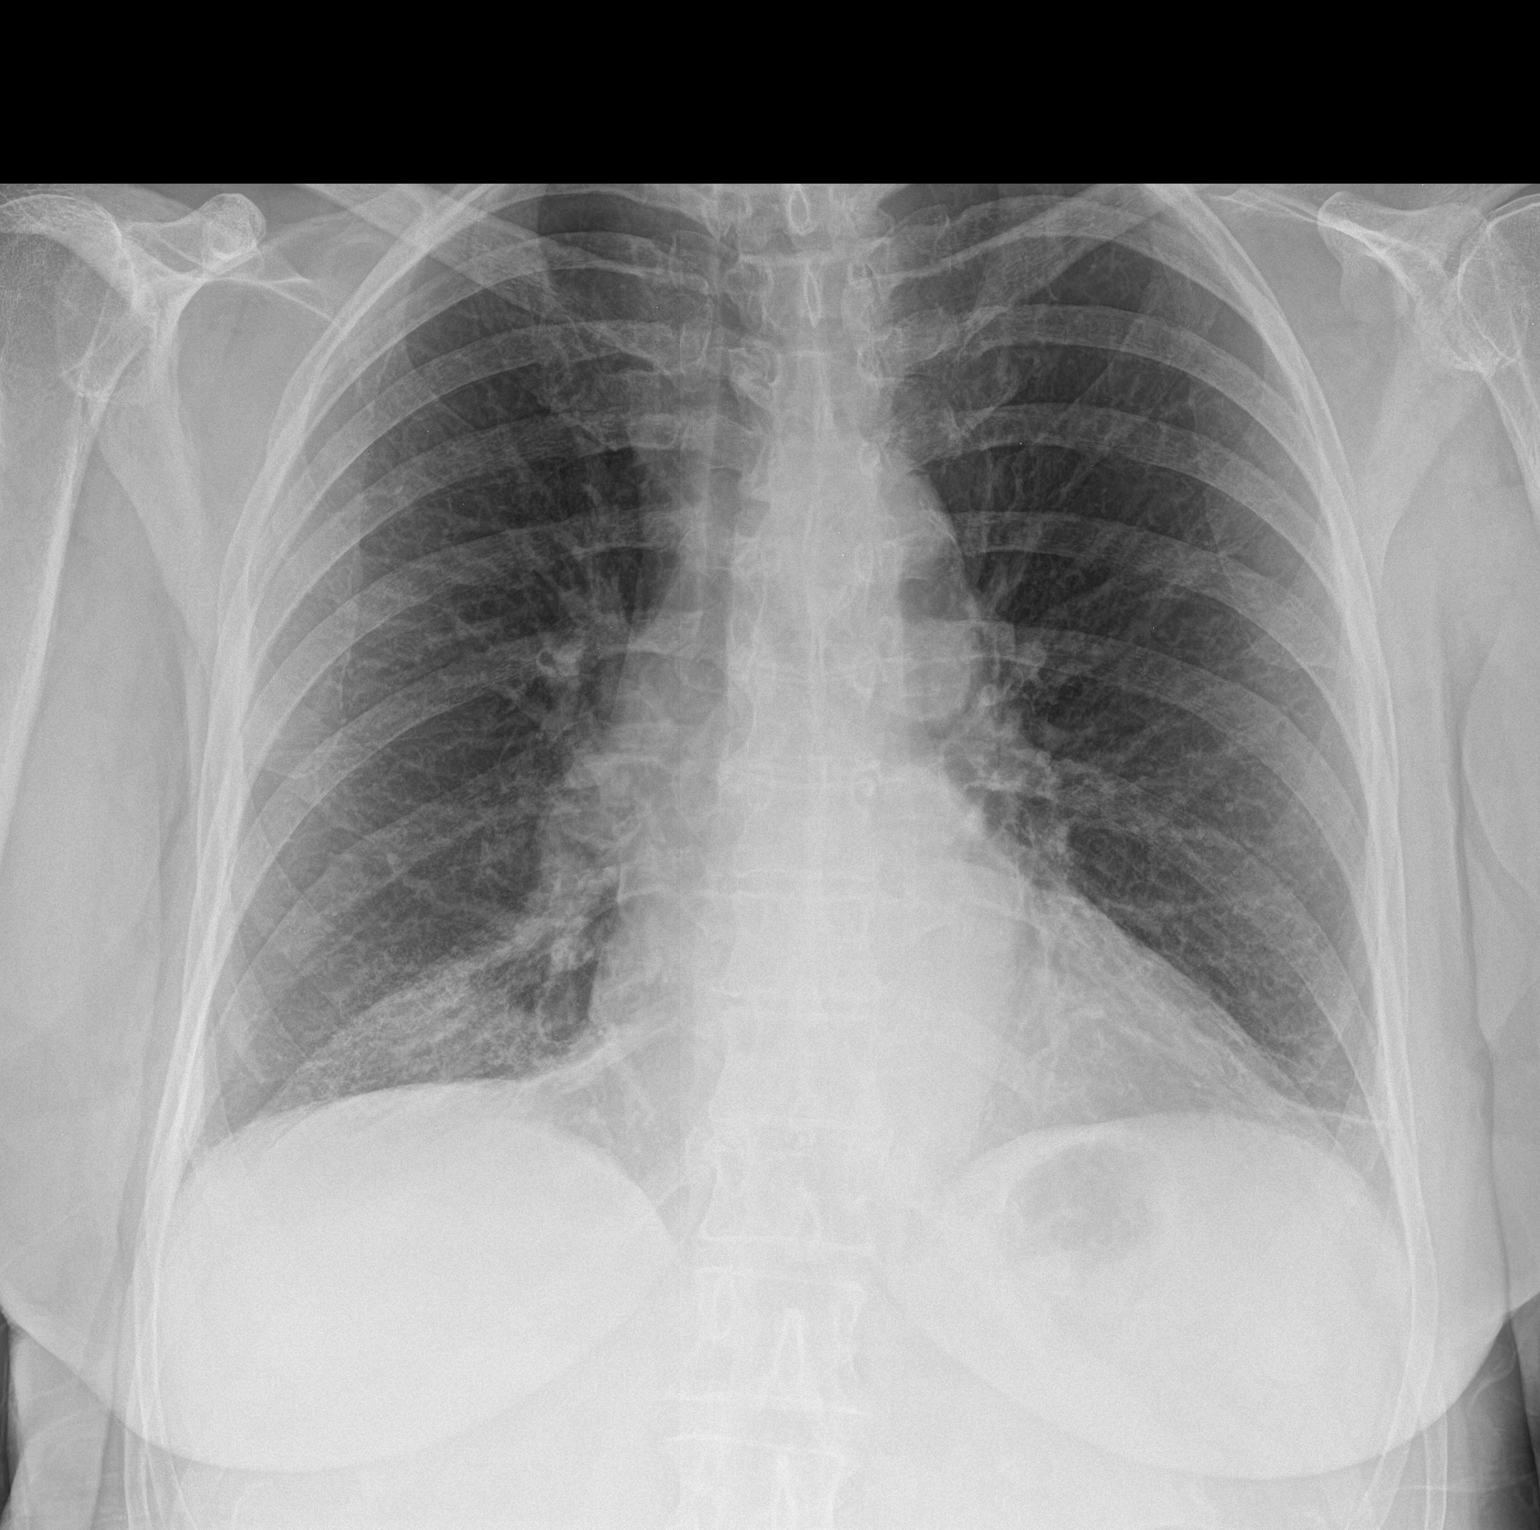

[1 of 1 positions shown; findings below may reference images not displayed]

FINDINGS: Cardiac shadow is within normal limits. The lungs are well aerated
bilaterally. Mild bibasilar atelectasis is seen. No focal confluent
infiltrate is noted. No bony abnormality is seen.
IMPRESSION: Mild bibasilar atelectasis.

## 2022-03-10 NOTE — Progress Notes (Addendum)
?[facility] - Clinic Note ? ?03/23/2022 ? ?  ? ?CHIEF COMPLAINT ?Patient presents for Retina Follow Up ? ? ? ?HISTORY OF PRESENT ILLNESS: ?Carla May is a 81 y.o. female who presents to the clinic today for:  ? ?HPI   ? ? Retina Follow Up   ?Patient presents with  Other.  In left eye.  This started years ago.  Duration of 4 weeks.  Since onset it is stable.  I, the attending physician,  performed the HPI with the patient and updated documentation appropriately. ? ?  ?  ? ? Comments   ?Patient feels that the distance vision is not as clear as it was. She has discontinued Prolensa and feels that's when the vision decreased.  ? ?  ?  ?Last edited by Rennis ChrisZamora, Kenzli Barritt, MD on 03/24/2022  1:36 PM.  ?  ? 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 ?Referring physician: ?Maurice SmallGriffin, Elaine, MD ?[address] ?[address] ?[place] [id number] ? ?HISTORICAL INFORMATION:  ? ?Selected notes from the MEDICAL RECORD NUMBER ?Referred by Dr. Ashok CordiaMaria Johnson for concern of retinal hole.  ? ?CURRENT MEDICATIONS: ?Current Outpatient Medications (Ophthalmic Drugs)  ?Medication Sig  ? Bromfenac Sodium (PROLENSA) 0.07 % SOLN   ? carboxymethylcellulose (REFRESH PLUS) 0.5 % SOLN Place 1 drop into both eyes 4 (four) times daily as needed (for dry eyes).  ? bacitracin-polymyxin b (POLYSPORIN) ophthalmic ointment Place 1 application into the left eye at bedtime as needed. (Patient not taking: Reported on 03/23/2022)  ? Bromfenac Sodium (PROLENSA) 0.07 % SOLN Place 1 drop into the left eye daily. (Patient not taking: Reported on 03/23/2022)  ? Bromfenac Sodium (PROLENSA) 0.07 % SOLN Place 1 drop into the left eye 4 (four) times daily. (Patient not taking: Reported on 03/23/2022)  ? dorzolamide-timolol (COSOPT) 22.3-6.8 MG/ML ophthalmic  solution Place 1 drop into the left eye 2 (two) times daily. (Patient not taking: Reported on 03/23/2022)  ? DUREZOL 0.05 % EMUL Place 1 drop into the left eye 3 (three) times daily. (Patient not taking: Reported on 03/23/2022)  ? prednisoLONE acetate (PRED FORTE) 1 % ophthalmic suspension Place 1 drop into the left eye 4 (four) times daily. (Patient not taking: Reported on 03/23/2022)  ? ?No current facility-administered medications for this visit. (Ophthalmic Drugs)  ? ?Current Outpatient Medications (Other)  ?Medication Sig  ? acetaminophen (TYLENOL) 650 MG CR tablet Take 650 mg by mouth daily.  ? aspirin 81 MG chewable tablet 1 tablet  ? Multiple Vitamin (MULTIVITAMIN WITH MINERALS) TABS tablet Take 1 tablet by mouth daily. One-A-Day for Women 50+  ? nabumetone (RELAFEN) 500 MG tablet Take 750 mg by mouth 3 (three) times daily.  ? omeprazole (PRILOSEC OTC) 20 MG tablet Take 20 mg by mouth daily.  ? aspirin EC 81 MG tablet Take 81 mg by mouth daily.  ? azithromycin (ZITHROMAX Z-PAK) 250 MG tablet 2 po day one, then 1 daily x 4 days (Patient not taking: Reported on 03/23/2022)  ? ibuprofen (ADVIL) 800 MG tablet Take 800 mg by mouth every 8 (eight) hours as needed.  ? icosapent Ethyl (VASCEPA) 1 g capsule Take 2 g by mouth 2 (two) times daily. (Patient not taking: Reported on 03/23/2022)  ? predniSONE (DELTASONE)  20 MG tablet Take 3 tablets (60 mg total) by mouth daily. (Patient not taking: Reported on 03/23/2022)  ? ?Current Facility-Administered Medications (Other)  ?Medication Route  ? triamcinolone acetonide (KENALOG-40) injection 4 mg Intravitreal  ? ?REVIEW OF SYSTEMS: ?ROS   ?Positive for: Gastrointestinal, Neurological, Musculoskeletal, HENT, Eyes ?Negative for: Constitutional, Skin, Genitourinary, Endocrine, Cardiovascular, Respiratory, Psychiatric, Allergic/Imm, Heme/Lymph ?Last edited by Julieanne Cotton, COT on 03/23/2022  9:47 AM.  ?  ? ? ?ALLERGIES ?Allergies  ?Allergen Reactions  ? Atorvastatin Other (See  Comments)  ?  Muscle aches/ memory loss  ? Penicillins Itching, Nausea Only and Other (See Comments)  ?  Did it involve swelling of the face/tongue/throat, SOB, or low BP? Unknown ?Did it involve sudden or severe rash/hives, skin peeling, or any reaction on the inside of your mouth or nose? Itching ?Did you need to seek medical attention at a hospital or doctor's office? No ?When did it last happen? 1970s ?If all above answers are ?NO?, may proceed with cephalosporin use. ?  ? Sulfa Antibiotics Nausea Only  ? ?PAST MEDICAL HISTORY ?Past Medical History:  ?Diagnosis Date  ? Anxiety   ? 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 Diabetes mellitus without complication (HCC)   ? GERD (gastroesophageal reflux disease)   ? History of palpitations   ? HOH (hard of hearing) 06/28/2019  ? Hyperlipidemia   ? diet controlled and fish oil  ? MVP (mitral valve prolapse)   ? never has caused any problems per patient on 06/27/19  ? PONV (postoperative nausea and vomiting)   ? No problems in 08/2018; however has had previous problems  ? Pre-diabetes   ? diet controlled, no med  ? 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 SVD (spontaneous vaginal delivery)   ? x 2  ? ?Past Surgical History:  ?Procedure Laterality Date  ? ABDOMINAL HYSTERECTOMY  1970's  ? AIR/FLUID EXCHANGE Left 06/28/2019  ? Procedure: Air/Fluid Exchange;  Surgeon: Rennis Chris, MD;  [facility];  Service: Ophthalmology;  Laterality: Left;  ? CATARACT EXTRACTION Bilateral   ? Dr. Vonna Kotyk  ? COLONOSCOPY    ? polyp  ? EYE SURGERY Bilateral   ? Cat Sx and RD repair  ? GAS INSERTION Right 11/26/2014  ? Procedure: INSERTION OF GAS;  Surgeon: Sherrie George, MD;  [facility];  Service: Ophthalmology;  Laterality: Right;  C3F8  ? GAS/FLUID EXCHANGE Left 08/21/2018  ? Procedure: GAS/FLUID EXCHANGE;  Surgeon: Rennis Chris, MD;  [facility]: [facility] OR;  Service: Ophthalmology;  Laterality: Left;  C3F8  ? KNEE ARTHROSCOPY Left   ? LASER PHOTO ABLATION Left 06/28/2019  ? Procedure: Laser Photo Ablation;   Surgeon: Rennis Chris, MD;  Location: [facility] OR;  Service: Ophthalmology;  Laterality: Left;  ? PARS PLANA VITRECTOMY Left 06/28/2019  ? Procedure: PARS PLANA VITRECTOMY WITH 25 GAUGE;  Surgeon: Rennis Chris, MD;  Location: [facility] OR;  Service: Ophthalmology;  Laterality: Left;  ? PHOTOCOAGULATION WITH LASER Right 11/26/2014  ? Procedure: PHOTOCOAGULATION WITH LASER;  Surgeon: Sherrie George, MD;  Location: [facility] OR;  Service: Ophthalmology;  Laterality: Right;  ? PHOTOCOAGULATION WITH LASER Left 08/21/2018  ? Procedure: PHOTOCOAGULATION WITH LASER;  Surgeon: Rennis Chris, MD;  Location[facility] OR;  Service: Ophthalmology;  Laterality: Left;  ? REPAIR OF COMPLEX TRACTION RETINAL DETACHMENT Left 12/06/2018  ? Procedure: REPAIR OF COMPLEX TRACTION RETINAL DETACHMENT, 25 gauge vitrectomy, endolaser photocoagulation, memebrane peel, perfluoron injection,  and silicone oil;  Surgeon: Rennis Chris, MD;  Location: [facility] OR;  Service: Ophthalmology;  Laterality: Left;  ? RETINAL DETACHMENT SURGERY Bilateral   ? SBP OD - Dr. Alan Mulder (1.12.16).  SBP - Dr. Rennis Chris (10.7.19)  ? SCLERAL BUCKLE Right 11/26/2014  ? Procedure: SCLERAL BUCKLE RIGHT EYE ;  Surgeon: Sherrie George, MD;  [facility];  Service: Ophthalmology;  Laterality: Right;  ? SCLERAL BUCKLE WITH POSSIBLE 25 GAUGE PARS PLANA VITRECTOMY Left 08/21/2018  ? Procedure: SCLERAL BUCKLE WITH 25 GAUGE PARS PLANA VITRECTOMY;  Surgeon: Rennis Chris, MD;  Location: [facility] OR;  Service: Ophthalmology;  Laterality: Left;  ? SILICON OIL REMOVAL Left 06/28/2019  ? Procedure: Silicon Oil Removal;  Surgeon: Rennis Chris, MD;  Location: [facility] OR;  Service: Ophthalmology;  Laterality: Left;  ? TONSILLECTOMY    ? ?FAMILY HISTORY ?Family History  ?Problem Relation Age of Onset  ? Cancer Mother   ? Heart failure Father   ? ?SOCIAL HISTORY ?Social History  ? ?Tobacco Use  ? Smoking status: Former  ?  Packs/day: 0.50  ?  Years: 10.00  ?  Pack years: 5.00  ?  Types: Cigarettes  ?  Quit date: 1970   ?  Years since quitting: 53.3  ? Smokeless tobacco: Never  ? Tobacco comments:  ?  quit in 1970's  ?Vaping Use  ? Vaping Use: Never used  ?Substance Use Topics  ? Alcohol use: No  ? Drug use: No  ?  ? ?  ?OPHTHALM

## 2022-03-23 ENCOUNTER — Ambulatory Visit (INDEPENDENT_AMBULATORY_CARE_PROVIDER_SITE_OTHER): Payer: Medicare Other | Admitting: Ophthalmology

## 2022-03-23 ENCOUNTER — Encounter (INDEPENDENT_AMBULATORY_CARE_PROVIDER_SITE_OTHER): Payer: Self-pay | Admitting: Ophthalmology

## 2022-03-23 DIAGNOSIS — H15092 Other scleritis, left eye: Secondary | ICD-10-CM | POA: Diagnosis not present

## 2022-03-23 DIAGNOSIS — H3342 Traction detachment of retina, left eye: Secondary | ICD-10-CM

## 2022-03-23 DIAGNOSIS — H35352 Cystoid macular degeneration, left eye: Secondary | ICD-10-CM

## 2022-03-23 DIAGNOSIS — H3322 Serous retinal detachment, left eye: Secondary | ICD-10-CM

## 2022-03-23 DIAGNOSIS — Z8669 Personal history of other diseases of the nervous system and sense organs: Secondary | ICD-10-CM

## 2022-03-23 DIAGNOSIS — H40052 Ocular hypertension, left eye: Secondary | ICD-10-CM

## 2022-03-23 DIAGNOSIS — Z961 Presence of intraocular lens: Secondary | ICD-10-CM

## 2022-03-23 DIAGNOSIS — H04123 Dry eye syndrome of bilateral lacrimal glands: Secondary | ICD-10-CM

## 2022-03-24 ENCOUNTER — Encounter (INDEPENDENT_AMBULATORY_CARE_PROVIDER_SITE_OTHER): Payer: Self-pay | Admitting: Ophthalmology

## 2022-04-07 NOTE — Progress Notes (Addendum)
[facility] Note  04/20/2022     CHIEF COMPLAINT Patient presents for Retina Follow Up    HISTORY OF PRESENT ILLNESS: Carla May is a 81 y.o. female who presents to the clinic today for:   HPI     Retina Follow Up   Patient presents with  Other.  In left eye.  This started 6 weeks ago.  I, the attending physician,  performed the HPI with the patient and updated documentation appropriately.        Comments   Patient here for 6 weeks retina follow up for CME OS. Patient states vision doing pretty good. Close up still a problem as good as going to be. No eye pain.      Last edited by Bernarda Caffey, MD on 04/23/2022 11:24 AM.    Pt states her VA has been stable, did have proper specs w her today.    Referring physician: Kelton Pillar, MD [place],  [place] [id number]  HISTORICAL INFORMATION:   Selected notes from the MEDICAL RECORD NUMBER Referred by Dr. Len Blalock for concern of retinal hole.   CURRENT MEDICATIONS: Current Outpatient Medications (Ophthalmic Drugs)  Medication Sig   Bromfenac Sodium (PROLENSA) 0.07 % SOLN Place 1 drop into the left eye 4 (four) times daily. (Patient taking differently: Place 1 drop into the left eye in the morning and at bedtime.)   carboxymethylcellulose (REFRESH PLUS) 0.5 % SOLN Place 1 drop into both eyes 4 (four) times daily as needed (for dry eyes).   prednisoLONE acetate (PRED FORTE) 1 % ophthalmic suspension Place 1 drop into the left eye 4 (four) times daily. (Patient taking differently: Place 1 drop into the left eye in the morning and at bedtime.)   bacitracin-polymyxin b (POLYSPORIN) ophthalmic ointment Place 1 application into the left eye at bedtime as needed. (Patient not taking: Reported on 03/23/2022)   dorzolamide-timolol (COSOPT) 22.3-6.8 MG/ML ophthalmic solution Place 1 drop into the left eye 2 (two) times daily. (Patient not taking: Reported on 03/23/2022)    DUREZOL 0.05 % EMUL Place 1 drop into the left eye 3 (three) times daily. (Patient not taking: Reported on 03/23/2022)   No current facility-administered medications for this visit. (Ophthalmic Drugs)   Current Outpatient Medications (Other)  Medication Sig   acetaminophen (TYLENOL) 650 MG CR tablet Take 1,300 mg by mouth every 8 (eight) hours as needed for pain.   aspirin 81 MG chewable tablet Chew 81 mg by mouth daily.   b complex vitamins capsule Take 1 capsule by mouth daily.   icosapent Ethyl (VASCEPA) 1 g capsule Take 2 g by mouth 2 (two) times daily.   Multiple Minerals (CALCIUM-MAGNESIUM-ZINC) TABS Take 1 tablet by mouth daily.   Multiple Vitamin (MULTIVITAMIN WITH MINERALS) TABS tablet Take 1 tablet by mouth daily. One-A-Day for Women 50+   nabumetone (RELAFEN) 500 MG tablet Take 500 mg by mouth 2 (two) times daily as needed for moderate pain.   omeprazole (PRILOSEC OTC) 20 MG tablet Take 20 mg by mouth daily.   vitamin C (ASCORBIC ACID) 500 MG tablet Take 500 mg by mouth daily.   azithromycin (ZITHROMAX Z-PAK) 250 MG tablet 2 po day one, then 1 daily x 4 days (Patient not taking: Reported on 03/23/2022)   predniSONE (DELTASONE) 20 MG tablet Take 3 tablets (60 mg total) by mouth daily. (Patient not taking: Reported on 03/23/2022)   Current Facility-Administered Medications (Other)  Medication Route  triamcinolone acetonide (KENALOG-40) injection 4 mg Intravitreal   REVIEW OF SYSTEMS: ROS   Positive for: Gastrointestinal, Neurological, Musculoskeletal, HENT, Eyes Negative for: Constitutional, Skin, Genitourinary, Endocrine, Cardiovascular, Respiratory, Psychiatric, Allergic/Imm, Heme/Lymph Last edited by Theodore Demark, COA on 04/20/2022  9:43 AM.     ALLERGIES Allergies  Allergen Reactions   Atorvastatin Other (See Comments)    Muscle aches/ memory loss   Penicillins Itching and Nausea Only    Did it involve swelling of the face/tongue/throat, SOB, or low BP? Unknown Did it  involve sudden or severe rash/hives, skin peeling, or any reaction on the inside of your mouth or nose? Itching Did you need to seek medical attention at a hospital or doctor's office? No When did it last happen? 1970s If all above answers are "NO", may proceed with cephalosporin use.    Sulfa Antibiotics Nausea Only   PAST MEDICAL HISTORY Past Medical History:  Diagnosis Date   Anxiety    Arthritis    back. neck   Diabetes mellitus without complication (HCC)    Dysrhythmia    GERD (gastroesophageal reflux disease)    History of palpitations    HOH (hard of hearing) 06/28/2019   Hyperlipidemia    diet controlled and fish oil   MVP (mitral valve prolapse)    never has caused any problems per patient on 06/27/19   PONV (postoperative nausea and vomiting)    No problems in 08/2018; however has had previous problems   Pre-diabetes    diet controlled, no med   Retinal detachment    OU   SVD (spontaneous vaginal delivery)    x 2   Past Surgical History:  Procedure Laterality Date   ABDOMINAL HYSTERECTOMY  1970's   AIR/FLUID EXCHANGE Left 06/28/2019   Procedure: Air/Fluid Exchange;  Surgeon: Bernarda Caffey, MD;  Location: [facility];  Service: Ophthalmology;  Laterality: Left;   CATARACT EXTRACTION Bilateral    Dr. Talbert Forest   COLONOSCOPY     polyp   EYE SURGERY Bilateral    Cat Sx and RD repair   GAS INSERTION Right 11/26/2014   Procedure: INSERTION OF GAS;  Surgeon: Hayden Pedro, MD;  Location: [facility];  Service: Ophthalmology;  Laterality: Right;  C3F8   GAS/FLUID EXCHANGE Left 08/21/2018   Procedure: GAS/FLUID EXCHANGE;  Surgeon: Bernarda Caffey, MD;  Location: [facility];  Service: Ophthalmology;  Laterality: Left;  C3F8   KNEE ARTHROSCOPY Left    LASER PHOTO ABLATION Left 06/28/2019   Procedure: Laser Photo Ablation;  Surgeon: Bernarda Caffey, MD;  Location: [facility];  Service: Ophthalmology;  Laterality: Left;   PARS PLANA VITRECTOMY Left 06/28/2019   Procedure: PARS PLANA VITRECTOMY WITH 25  GAUGE;  Surgeon: Bernarda Caffey, MD;  Location: [place];  Service: Ophthalmology;  Laterality: Left;   PHOTOCOAGULATION WITH LASER Right 11/26/2014   Procedure: PHOTOCOAGULATION WITH LASER;  Surgeon: Hayden Pedro, MD;  Location: [facility];  Service: Ophthalmology;  Laterality: Right;   PHOTOCOAGULATION WITH LASER Left 08/21/2018   Procedure: PHOTOCOAGULATION WITH LASER;  Surgeon: Bernarda Caffey, MD;  Location: [place] [facility];  Service: Ophthalmology;  Laterality: Left;   REPAIR OF COMPLEX TRACTION RETINAL DETACHMENT Left 12/06/2018   Procedure: REPAIR OF COMPLEX TRACTION RETINAL DETACHMENT, 25 gauge vitrectomy, endolaser photocoagulation, memebrane peel, perfluoron injection,  and silicone oil;  Surgeon: Bernarda Caffey, MD;  Location: [facility];  Service: Ophthalmology;  Laterality: Left;   RETINAL DETACHMENT SURGERY Bilateral    SBP OD - Dr. Tempie Hoist (1.12.16).  SBP -  Dr. Bernarda Caffey (10.7.19)   SCLERAL BUCKLE Right 11/26/2014   Procedure: SCLERAL BUCKLE RIGHT EYE ;  Surgeon: Hayden Pedro, MD;  Location: [place];  Service: Ophthalmology;  Laterality: Right;   SCLERAL BUCKLE WITH POSSIBLE 25 GAUGE PARS PLANA VITRECTOMY Left 08/21/2018   Procedure: SCLERAL BUCKLE WITH 25 GAUGE PARS PLANA VITRECTOMY;  Surgeon: Bernarda Caffey, MD;  Location: [place] [facility];  Service: Ophthalmology;  Laterality: Left;   SILICON OIL REMOVAL Left 05/14/5283   Procedure: Silicon Oil Removal;  Surgeon: Bernarda Caffey, MD;  Location: [facility];  Service: Ophthalmology;  Laterality: Left;   TONSILLECTOMY     FAMILY HISTORY Family History  Problem Relation Age of Onset   Cancer Mother    Heart failure Father    SOCIAL HISTORY Social History   Tobacco Use   Smoking status: Former    Packs/day: 0.50    Years: 10.00    Total pack years: 5.00    Types: Cigarettes    Quit date: 1970    Years since quitting: 53.4   Smokeless tobacco: Never   Tobacco comments:    quit in 1970's  Vaping Use   Vaping Use: Never used  Substance Use Topics    Alcohol use: No   Drug use: No       OPHTHALMIC EXAM:  Base Eye Exam     Visual Acuity (Snellen - Linear)       Right Left   Dist cc 20/50 -2 20/60 +1   Dist ph cc 20/40 -2 NI    Correction: Glasses  Patient forgot regular glasses. Used RX sunglasses.        Tonometry (Tonopen, 9:40 AM)       Right Left   Pressure 11 12         Pupils       Dark Light Shape React APD   Right 3 2 Round Minimal None   Left 3 2 Round Minimal None         Visual Fields (Counting fingers)       Left Right    Full Full         Extraocular Movement       Right Left    Full, Ortho Full, Ortho         Neuro/Psych     Oriented x3: Yes   Mood/Affect: Normal         Dilation     Both eyes: 1.0% Mydriacyl, 2.5% Phenylephrine @ 9:39 AM           Slit Lamp and Fundus Exam     Slit Lamp Exam       Right Left   Lids/Lashes Dermatochalasis - upper lid, Meibomian gland dysfunction Dermatochalasis - upper lid, Meibomian gland dysfunction   Conjunctiva/Sclera normal Stable improvement of focal nodule ST quad, +clear mucous inferiorly   Cornea 1-2+inferior Punctate epithelial erosions, Temporal Well healed cataract wounds, Debris in tear film, mild arcus Trace Punctate epithelial erosions, well healed cataract wound, mild tear film debris   Anterior Chamber Deep and quiet Deep, 1-2+ fine cell pigment   Iris Round and dilated Round and moderately dilated   Lens PC IOL in good position with open PC PC IOL in good position with open PC   Anterior Vitreous Vitreous syneresis, mild pigment in anterior vitreous, Posterior vitreous detachment; scattered vitreous debris Post vitrectomy, 1+pigment         Fundus Exam       Right Left  Disc Pink and Sharp, Compact Sharp rim, mild tilt, trace temporal pallor, temporal Peripapillary atrophy, Compact   C/D Ratio 0.2 0.3   Macula Flat, Good foveal reflex, Retinal pigment epithelial mottling, ERM, No heme or edema Flat, good  foveal reflex, +epiretinal membrane, mild Retinal pigment epithelial mottling, trace residual Cystic changes   Vessels attenuated, mild tortuousity attenuated, mild tortuosity   Periphery Attached over scleral buckle, good buckle height, good scarring over buckle Retina attached over buckle; good buckle height; good laser surrounding buckle w/ new row posterior to buckle; +fibrosis at 0300 over buckle; retinotomy from 0300-0430 with good laser surrounding           Refraction     Wearing Rx       Sphere Cylinder Axis   Right -2.25 +0.75 092   Left -2.75 +1.50 137           IMAGING AND PROCEDURES  Imaging and Procedures for '@TODAY' @  OCT, Retina - OU - Both Eyes       Right Eye Quality was good. Central Foveal Thickness: 297. Progression has been stable. Findings include normal foveal contour, no IRF, no SRF, epiretinal membrane (Stable ERM).   Left Eye Quality was good. Central Foveal Thickness: 276. Progression has been stable. Findings include normal foveal contour, no SRF, epiretinal membrane, intraretinal fluid (Persistent perifoveal cystic changes--trace improvement; Persistent ERM).   Notes *Images captured and stored on drive  Diagnosis / Impression:  OD: mild ERM; NFP; no IRF/SRF-stable from prior OS: Persistent perifoveal cystic changes--trace improvement; Persistent ERM  Clinical management:  See below  Abbreviations: NFP - Normal foveal profile. CME - cystoid macular edema. PED - pigment epithelial detachment. IRF - intraretinal fluid. SRF - subretinal fluid. EZ - ellipsoid zone. ERM - epiretinal membrane. ORA - outer retinal atrophy. ORT - outer retinal tubulation. SRHM - subretinal hyper-reflective material            ASSESSMENT/PLAN:    ICD-10-CM   1. CME (cystoid macular edema), left  H35.352 OCT, Retina - OU - Both Eyes    2. Left retinal detachment  H33.22     3. 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 History of retinal detachment  Z86.69     6. Pseudophakia of both eyes  Z96.1     7. Dry eyes  H04.123      1. CME OS   - history of recurrent post-op CME - s/p sub-tenons kenalog (03.13.20)  - interval increase / redevelopment of cystic changes OS on 12.09.21 and more recently on 12.20.22 -- restarted Prolensa QID OS (was still on PF QID OS)  - OCT today Persistent perifoveal cystic changes--trace improvement; Persistent ERM.  - BCVA 20/60, decrease in [place] on 05.09.23 due to pt not having proper rx specs with her.   - pt follows with Dr. Helane Rima, who she saw in April, he tapered PF to BID OS and Prolensa to once a day.  - increased back to BID per Dr. Coralyn Pear (05.09.23)  - recommend increasing PF and Prolensa to 3-4x/day OS   - discussed possible need for low dose maintenance therapy to keep CME away  - f/u 4-6 weeks -- DFE/OCT  2,3. Retinal detachment, OS             - originally: macula-sparing inferior retinal detachment from 3-6 oclock             -  large HST at 0500 and 2 small tears at 300 within the detached retina             - s/p SBP + PPV/PFC/EL/FAX/14% C3F8 OS, 10.07.19 - progressive fibrosis/PVR just posterior to buckle around 0400 with +SRF tracking posterior to buckle -- focal tractional/PVR detachment OS             - s/p SVX/BL/[id number]/MA/[id number] silicon oil OS, 4.56.2563  - s/p 89H PPV with silicon oil removal (73.42.87)  - retina attached and in good position -- good buckle height and laser over buckle and around retinotomy             - IOP okay at 16 today  - BCVA 20/40 as discussed above   4. Nodular scleritis OS -- recurrent   - history of recurrent episodes following the tapering of meds (PO pred and topical PF/Prolensa)  - today (4.4.23) -- interval redevelopment of focal nodular scleritis in ST quad             - limited lab work to rule out rheumatologic cause--all results negative   CBC, CMP   HIV   HLA  Panel   ANA   ANCA   ACE, Lysozyme   RF   ESR, CRP  - restarted Prolensa and PF QID OS on 4.4.23  - pt saw Dr. Helane Rima on April 13 - he decreased PF to BID OS and Prolensa to once a day, pt has not been using Prolensa  - today, inflammatory nodule appears improved  - will increase Prolensa to BID OS  - pt already on ibuprofen and relafen for back pain -- ?need for oral prednisone or other anti-inflammatories  5. History of retinal detachment OD  - S/P SBP OD 11/26/14 -- Dr. Tempie Hoist  - looks great -- retina in great position  - monitor  6. Pseudophakia OU  - s/p CE/IOL OU ([place])  - doing well  - monitor  7. Dry eyes OU - recommend lubricating ointment at night along with artificial tears     Ophthalmic Meds Ordered this visit:  No orders of the defined types were placed in this encounter.    Return for 3-4 weeks f/u CME OS , DFE, OCT.  There are no Patient Instructions on file for this visit.  Explained the diagnoses, plan, and follow up with the patient and they expressed understanding.  Patient expressed understanding of the importance of proper follow up care.   This document serves as a record of services personally performed by Gardiner Sleeper, MD, PhD. It was created on their behalf by Bernarda Caffey, MD, an ophthalmic technician. The creation of this record is the provider's dictation and/or activities during the visit.    Electronically signed by: Bernarda Caffey, MD 04/07/22 11:30 AM   This document serves as a record of services personally performed by Gardiner Sleeper, MD, PhD. It was created on their behalf by Orvan Falconer, an ophthalmic technician. The creation of this record is the provider's dictation and/or activities during the visit.    Electronically signed by: Orvan Falconer, OA, 04/23/22  11:30 AM   Gardiner Sleeper, M.D., Ph.D. Diseases & Surgery of the Retina and Vitreous [facility]  I have reviewed the above  documentation for accuracy and completeness, and I agree with the above. Gardiner Sleeper, M.D., Ph.D. 04/23/22 11:30 AM   Abbreviations: M myopia (nearsighted); A astigmatism; H hyperopia (farsighted); P presbyopia; Mrx spectacle prescription;  CTL contact lenses; OD right eye; OS left eye; OU both eyes  XT exotropia; ET esotropia; PEK punctate epithelial keratitis; PEE punctate epithelial erosions; DES dry eye syndrome; MGD meibomian gland dysfunction; ATs artificial tears; PFAT's preservative free artificial tears; [place] nuclear sclerotic cataract; PSC posterior subcapsular cataract; ERM epi-retinal membrane; PVD posterior vitreous detachment; RD retinal detachment; DM diabetes mellitus; DR diabetic retinopathy; NPDR non-proliferative diabetic retinopathy; PDR proliferative diabetic retinopathy; CSME clinically significant macular edema; DME diabetic macular edema; dbh dot blot hemorrhages; CWS cotton wool spot; POAG primary open angle glaucoma; C/D cup-to-disc ratio; HVF humphrey visual field; GVF goldmann visual field; OCT optical coherence tomography; IOP intraocular pressure; BRVO Branch retinal vein occlusion; CRVO central retinal vein occlusion; CRAO central retinal artery occlusion; BRAO branch retinal artery occlusion; RT retinal tear; SB scleral buckle; PPV pars plana vitrectomy; VH Vitreous hemorrhage; PRP panretinal laser photocoagulation; IVK intravitreal kenalog; VMT vitreomacular traction; MH Macular hole;  NVD neovascularization of the disc; NVE neovascularization elsewhere; AREDS age related eye disease study; ARMD age related macular degeneration; POAG primary open angle glaucoma; EBMD epithelial/anterior basement membrane dystrophy; ACIOL anterior chamber intraocular lens; IOL intraocular lens; PCIOL posterior chamber intraocular lens; Phaco/IOL phacoemulsification with intraocular lens placement; [place] photorefractive keratectomy; LASIK laser assisted in situ keratomileusis; HTN hypertension; DM  diabetes mellitus; COPD chronic obstructive pulmonary disease

## 2022-04-07 NOTE — H&P (Signed)
TOTAL KNEE ADMISSION H&P  Patient is being admitted for left total knee arthroplasty.  Subjective:  Chief Complaint: Left knee pain.  HPI: Carla May, 81 y.o. female has a history of pain and functional disability in the left knee due to arthritis and has failed non-surgical conservative treatments for greater than 12 weeks to include NSAID's and/or analgesics, corticosteriod injections, and activity modification. Onset of symptoms was gradual, starting several years ago with gradually worsening course since that time. The patient noted no past surgery on the left knee.  Patient currently rates pain in the left knee at 7 out of 10 with activity. Patient has night pain and worsening of pain with activity and weight bearing. Patient has evidence of  bone-on-bone arthritis in the lateral and patellofemoral compartments of the left knee with large osteophyte formation throughout  by imaging studies. There is no active infection.  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 neck   Diabetes mellitus without complication (HCC)    GERD (gastroesophageal reflux disease)    History of palpitations    HOH (hard of hearing) 06/28/2019   Hyperlipidemia    diet controlled and fish oil   MVP (mitral valve prolapse)    never has caused any problems per patient on 06/27/19   PONV (postoperative nausea and vomiting)    No problems in 08/2018; however has had previous problems   Pre-diabetes    diet controlled, no med   Retinal detachment    OU   SVD (spontaneous vaginal delivery)    x 2    Past Surgical History:  Procedure Laterality Date   ABDOMINAL HYSTERECTOMY  1970's   AIR/FLUID EXCHANGE Left 06/28/2019   Procedure: Air/Fluid Exchange;  Surgeon: Bernarda Caffey, MD;  Location: [place];  Service: Ophthalmology;   Laterality: Left;   CATARACT EXTRACTION Bilateral    Dr. Talbert Forest   COLONOSCOPY     polyp   EYE SURGERY Bilateral    Cat Sx and RD repair   GAS INSERTION Right 11/26/2014   Procedure: INSERTION OF GAS;  Surgeon: Hayden Pedro, MD;  Location: [place];  Service: Ophthalmology;  Laterality: Right;  C3F8   GAS/FLUID EXCHANGE Left 08/21/2018   Procedure: GAS/FLUID EXCHANGE;  Surgeon: Bernarda Caffey, MD;  Location: [place];  Service: Ophthalmology;  Laterality: Left;  C3F8   KNEE ARTHROSCOPY Left    LASER PHOTO ABLATION Left 06/28/2019   Procedure: Laser Photo Ablation;  Surgeon: Bernarda Caffey, MD;  Location: [place];  Service: Ophthalmology;  Laterality: Left;   PARS PLANA VITRECTOMY Left 06/28/2019   Procedure: PARS PLANA VITRECTOMY WITH 25 GAUGE;  Surgeon: Bernarda Caffey, MD;  Location: [place];  Service: Ophthalmology;  Laterality: Left;   PHOTOCOAGULATION WITH LASER Right 11/26/2014   Procedure: PHOTOCOAGULATION WITH LASER;  Surgeon: Hayden Pedro, MD;  Location: [place];  Service: Ophthalmology;  Laterality: Right;   PHOTOCOAGULATION WITH LASER Left 08/21/2018   Procedure: PHOTOCOAGULATION WITH LASER;  Surgeon: Bernarda Caffey, MD;  Location: [place];  Service: Ophthalmology;  Laterality: Left;   REPAIR OF COMPLEX TRACTION RETINAL DETACHMENT Left 12/06/2018   Procedure: REPAIR OF COMPLEX TRACTION RETINAL DETACHMENT, 25 gauge vitrectomy, endolaser photocoagulation, memebrane peel, perfluoron injection,  and silicone oil;  Surgeon: Bernarda Caffey, MD;  Location: [place];  Service: Ophthalmology;  Laterality: Left;   RETINAL DETACHMENT SURGERY Bilateral    SBP OD - Dr. Tempie Hoist (1.12.16).  SBP - Dr. Bernarda Caffey (10.7.19)   SCLERAL BUCKLE Right 11/26/2014   Procedure: SCLERAL BUCKLE RIGHT EYE ;  Surgeon: Hayden Pedro, MD;  Location: [place];  Service: Ophthalmology;  Laterality: Right;   SCLERAL BUCKLE WITH POSSIBLE 25 GAUGE PARS PLANA VITRECTOMY Left 08/21/2018   Procedure: SCLERAL BUCKLE WITH 25 GAUGE PARS  PLANA VITRECTOMY;  Surgeon: Bernarda Caffey, MD;  Location: [place];  Service: Ophthalmology;  Laterality: Left;   SILICON OIL REMOVAL Left [id number]   Procedure: Silicon Oil Removal;  Surgeon: Bernarda Caffey, MD;  Location: [place];  Service: Ophthalmology;  Laterality: Left;   TONSILLECTOMY      Prior to Admission medications   Medication Sig Start Date End Date Taking? Authorizing Provider  acetaminophen (TYLENOL) 650 MG CR tablet Take 650 mg by mouth daily.    [provider]  aspirin 81 MG chewable tablet 1 tablet    [provider]  aspirin EC 81 MG tablet Take 81 mg by mouth daily.    [provider]  azithromycin (ZITHROMAX Z-PAK) 250 MG tablet 2 po day one, then 1 daily x 4 days Patient not taking: Reported on 03/23/2022 07/03/21   Veryl Speak, MD  bacitracin-polymyxin b (POLYSPORIN) ophthalmic ointment Place 1 application into the left eye at bedtime as needed. Patient not taking: Reported on 03/23/2022 07/06/19   Bernarda Caffey, MD  Bromfenac Sodium (PROLENSA) 0.07 % SOLN Place 1 drop into the left eye daily. Patient not taking: Reported on 03/23/2022 12/25/20   [provider]  Bromfenac Sodium (PROLENSA) 0.07 % SOLN     [provider]  Bromfenac Sodium (PROLENSA) 0.07 % SOLN Place 1 drop into the left eye 4 (four) times daily. Patient not taking: Reported on 03/23/2022 02/16/22   Bernarda Caffey, MD  carboxymethylcellulose (REFRESH PLUS) 0.5 % SOLN Place 1 drop into both eyes 4 (four) times daily as needed (for dry eyes).    [provider]  dorzolamide-timolol (COSOPT) 22.3-6.8 MG/ML ophthalmic solution Place 1 drop into the left eye 2 (two) times daily. Patient not taking: Reported on 03/23/2022 05/18/19   Bernarda Caffey, MD  DUREZOL 0.05 % EMUL Place 1 drop into the left eye 3 (three) times daily. Patient not taking: Reported on 03/23/2022 05/18/19   Bernarda Caffey, MD  ibuprofen (ADVIL) 800 MG tablet Take 800 mg by mouth every 8 (eight) hours as  needed.    [provider]  icosapent Ethyl (VASCEPA) 1 g capsule Take 2 g by mouth 2 (two) times daily. Patient not taking: Reported on 03/23/2022 06/25/21   [provider]  Multiple Vitamin (MULTIVITAMIN WITH MINERALS) TABS tablet Take 1 tablet by mouth daily. One-A-Day for Women 50+    [provider]  nabumetone (RELAFEN) 500 MG tablet Take 750 mg by mouth 3 (three) times daily. 06/11/19   [provider]  omeprazole (PRILOSEC OTC) 20 MG tablet Take 20 mg by mouth daily.    [provider]  prednisoLONE acetate (PRED FORTE) 1 % ophthalmic suspension Place 1 drop into the left eye 4 (four) times daily. Patient not taking: Reported on 03/23/2022 12/01/21   Bernarda Caffey, MD  predniSONE (DELTASONE) 20 MG tablet Take 3 tablets (60 mg total) by mouth daily. Patient not taking: Reported on 03/23/2022 08/16/19   Bernarda Caffey, MD    Allergies  Allergen Reactions   Atorvastatin  Other (See Comments)    Muscle aches/ memory loss   Penicillins Itching, Nausea Only and Other (See Comments)    Did it involve swelling of the face/tongue/throat, SOB, or low BP? Unknown Did it involve sudden or severe rash/hives, skin peeling, or any reaction on the inside of your mouth or nose? Itching Did you need to seek medical attention at a hospital or doctor's office? No When did it last happen? 1970s If all above answers are "NO", may proceed with cephalosporin use.    Sulfa Antibiotics Nausea Only    Social History   Socioeconomic History   Marital status: Divorced    Spouse name: Not on file   Number of children: Not on file   Years of education: Not on file   Highest education level: Not on file  Occupational History   Not on file  Tobacco Use   Smoking status: Former    Packs/day: 0.50    Years: 10.00    Pack years: 5.00    Types: Cigarettes    Quit date: 56    Years since quitting: 53.4   Smokeless tobacco: Never   Tobacco comments:    quit in  1970's  Vaping Use   Vaping Use: Never used  Substance and Sexual Activity   Alcohol use: No   Drug use: No   Sexual activity: Not on file    Comment: Hysterectomy  Other Topics Concern   Not on file  Social History Narrative   Not on file   Social Determinants of Health   Financial Resource Strain: Not on file  Food Insecurity: Not on file  Transportation Needs: Not on file  Physical Activity: Not on file  Stress: Not on file  Social Connections: Not on file  Intimate Partner Violence: Not on file    Tobacco Use: Medium Risk   Smoking Tobacco Use: Former   Smokeless Tobacco Use: Never   Passive Exposure: Not on file   Social History   Substance and Sexual Activity  Alcohol Use No    Family History  Problem Relation Age of Onset   Cancer Mother    Heart failure Father     Review of Systems  Constitutional:  Negative for chills and fever.  HENT: Negative.    Eyes: Negative.   Respiratory:  Negative for cough and shortness of breath.   Cardiovascular:  Negative for chest pain and palpitations.  Gastrointestinal:  Negative for abdominal pain, nausea and vomiting.  Genitourinary:  Negative for dysuria, frequency and urgency.  Musculoskeletal:  Positive for joint pain.  Skin:  Negative for rash.   Objective:  Physical Exam: Well nourished and well developed.  General: Alert and oriented x3, cooperative and pleasant, no acute distress.  Head: normocephalic, atraumatic, neck supple.  Eyes: EOMI.  Abdomen: non-tender to palpation and soft, normoactive bowel sounds. Musculoskeletal: The patient has an antalgic gait pattern favoring the left side without the use of assistive devices.  Right Knee Exam: No effusion present. No swelling present. The range of motion is: 0 to 130 degrees. Slight crepitus on range of motion of the knee. No medial joint line tenderness. No lateral joint line tenderness. The knee is stable.  Left Knee Exam: No effusion present.  No swelling present. Valgus deformity. The range of motion is: 10 to 125 degrees. Moderate crepitus on range of motion of the knee. Positive medial joint line tenderness. Positive lateral joint line tenderness. The knee is stable.  Calves soft and nontender. Motor  function intact in LE. Strength 5/5 LE bilaterally. Neuro: Distal pulses 2+. Sensation to light touch intact in LE.  Vital signs in last 24 hours: BP: ()/()  Arterial Line BP: ()/()   Imaging Review Plain radiographs demonstrate moderate degenerative joint disease of the left knee. The overall alignment is mild valgus. The bone quality appears to be adequate for age and reported activity level.  Assessment/Plan:  End stage arthritis, left knee   The patient history, physical examination, clinical judgment of the provider and imaging studies are consistent with end stage degenerative joint disease of the left knee and total knee arthroplasty is deemed medically necessary. The treatment options including medical management, injection therapy arthroscopy and arthroplasty were discussed at length. The risks and benefits of total knee arthroplasty were presented and reviewed. The risks due to aseptic loosening, infection, stiffness, patella tracking problems, thromboembolic complications and other imponderables were discussed. The patient acknowledged the explanation, agreed to proceed with the plan and consent was signed. Patient is being admitted for inpatient treatment for surgery, pain control, PT, OT, prophylactic antibiotics, VTE prophylaxis, progressive ambulation and ADLs and discharge planning. The patient is planning to be discharged  home .  Patient's anticipated LOS is less than 2 midnights, meeting these requirements: - Lives within 1 hour of care - Has a competent adult at home to recover with post-op - NO history of  - Chronic pain requiring opioids  - Coronary Artery Disease  - Heart failure  - Heart attack  -  Stroke  - DVT/VTE  - Cardiac arrhythmia  - Respiratory Failure/COPD  - Renal failure  - Anemia  - Advanced Liver disease  Therapy Plans: EO Disposition: Home with Daughter Planned DVT Prophylaxis: Aspirin 325 mg BID DME Needed: None PCP: Kelton Pillar, MD (clearance received) TXA: IV Allergies: sulfa (N/V) Anesthesia Concerns: N/V BMI: 26.3 Last HgbA1c: 6.7  Pharmacy: [facility])  - Patient was instructed on what medications to stop prior to surgery. - Follow-up visit in 2 weeks with Dr. Wynelle Link - Begin physical therapy following surgery - Pre-operative lab work as pre-surgical testing - Prescriptions will be provided in hospital at time of discharge  R. Jaynie Bream, PA-C Orthopedic Surgery [facility] [place]

## 2022-04-20 ENCOUNTER — Encounter (INDEPENDENT_AMBULATORY_CARE_PROVIDER_SITE_OTHER): Payer: Self-pay | Admitting: Ophthalmology

## 2022-04-20 ENCOUNTER — Ambulatory Visit (INDEPENDENT_AMBULATORY_CARE_PROVIDER_SITE_OTHER): Payer: Medicare Other | Admitting: Ophthalmology

## 2022-04-20 DIAGNOSIS — Z8669 Personal history of other diseases of the nervous system and sense organs: Secondary | ICD-10-CM

## 2022-04-20 DIAGNOSIS — H3342 Traction detachment of retina, left eye: Secondary | ICD-10-CM

## 2022-04-20 DIAGNOSIS — H35352 Cystoid macular degeneration, left eye: Secondary | ICD-10-CM

## 2022-04-20 DIAGNOSIS — H40052 Ocular hypertension, left eye: Secondary | ICD-10-CM

## 2022-04-20 DIAGNOSIS — H15092 Other scleritis, left eye: Secondary | ICD-10-CM | POA: Diagnosis not present

## 2022-04-20 DIAGNOSIS — Z961 Presence of intraocular lens: Secondary | ICD-10-CM

## 2022-04-20 DIAGNOSIS — H04123 Dry eye syndrome of bilateral lacrimal glands: Secondary | ICD-10-CM

## 2022-04-20 DIAGNOSIS — H3322 Serous retinal detachment, left eye: Secondary | ICD-10-CM | POA: Diagnosis not present

## 2022-04-20 NOTE — Progress Notes (Addendum)
COVID Vaccine Completed: yes x2  Date of COVID positive in last 90 days: no  PCP - Ardean Larsen, MD Cardiologist - n/a  Chest x-ray - 07/02/21 Epic EKG - dr Valentina Lucks office Stress Test - years ago ECHO - years ago wepr pt Cardiac Cath - n/a Pacemaker/ICD device last checked: n/a Spinal Cord Stimulator: n/a  Bowel Prep - no  Sleep Study - n/a CPAP -   Fasting Blood Sugar - no checks at home per pt, diet controlled  Checks Blood Sugar _____ times a day  Blood Thinner Instructions: Aspirin Instructions: ASA 81, hold 5 days Last Dose:  Activity level: Can go up a flight of stairs and perform activities of daily living without stopping and without symptoms of chest pain or shortness of breath.    Anesthesia review:   Patient denies shortness of breath, fever, cough and chest pain at PAT appointment  Patient verbalized understanding of instructions that were given to them at the PAT appointment. Patient was also instructed that they will need to review over the PAT instructions again at home before surgery.

## 2022-04-20 NOTE — Patient Instructions (Signed)
DUE TO COVID-19 ONLY TWO VISITORS  (aged 80 and older)  ARE ALLOWED TO COME WITH YOU AND STAY IN THE WAITING ROOM ONLY DURING PRE OP AND PROCEDURE.   **NO VISITORS ARE ALLOWED IN THE SHORT STAY AREA OR RECOVERY ROOM!!**  IF YOU WILL BE ADMITTED INTO THE HOSPITAL YOU ARE ALLOWED ONLY FOUR SUPPORT PEOPLE DURING VISITATION HOURS ONLY (7 AM -8PM)   The support person(s) must pass our screening, gel in and out, and wear a mask at all times, including in the patient's room. Patients must also wear a mask when staff or their support person are in the room. Visitors GUEST BADGE MUST BE WORN VISIBLY  One adult visitor may remain with you overnight and MUST be in the room by 8 P.M.     Your procedure is scheduled on: 05/03/22   Report to [facility] Main Entrance    Report to admitting at 10:00 AM   Call this number if you have problems the morning of surgery [phone]   Do not eat food :After Midnight.   After Midnight you may have the following liquids until 9:40 AM DAY OF SURGERY  Water Black Coffee (sugar ok, NO MILK/CREAM OR CREAMERS)  Tea (sugar ok, NO MILK/CREAM OR CREAMERS) regular and decaf                             Plain Jell-O (NO RED)                                           Fruit ices (not with fruit pulp, NO RED)                                     Popsicles (NO RED)                                                                  Juice: apple, WHITE grape, WHITE cranberry Sports drinks like Gatorade (NO RED) Clear broth(vegetable,chicken,beef)   The day of surgery:  Drink ONE (1) Pre-Surgery G2 at 9:40 AM the morning of surgery. Drink in one sitting. Do not sip.  This drink was given to you during your hospital  pre-op appointment visit. Nothing else to drink after completing the  Pre-Surgery G2.          If you have questions, please contact your surgeon's office.   FOLLOW BOWEL PREP AND ANY ADDITIONAL PRE OP INSTRUCTIONS YOU RECEIVED FROM YOUR SURGEON'S  OFFICE!!!     Oral Hygiene is also important to reduce your risk of infection.                                    Remember - BRUSH YOUR TEETH THE MORNING OF SURGERY WITH YOUR REGULAR TOOTHPASTE   Take these medicines the morning of surgery with A SIP OF WATER: Tylenol, Omeprazole   DO NOT TAKE ANY ORAL DIABETIC MEDICATIONS DAY OF YOUR SURGERY  How  to Manage Your Diabetes Before and After Surgery  Why is it important to control my blood sugar before and after surgery? Improving blood sugar levels before and after surgery helps healing and can limit problems. A way of improving blood sugar control is eating a healthy diet by:  Eating less sugar and carbohydrates  Increasing activity/exercise  Talking with your doctor about reaching your blood sugar goals High blood sugars (greater than 180 mg/dL) can raise your risk of infections and slow your recovery, so you will need to focus on controlling your diabetes during the weeks before surgery. Make sure that the doctor who takes care of your diabetes knows about your planned surgery including the date and location.  How do I manage my blood sugar before surgery? Check your blood sugar at least 4 times a day, starting 2 days before surgery, to make sure that the level is not too high or low. Check your blood sugar the morning of your surgery when you wake up and every 2 hours until you get to the Short Stay unit. If your blood sugar is less than 70 mg/dL, you will need to treat for low blood sugar: Do not take insulin. Treat a low blood sugar (less than 70 mg/dL) with  cup of clear juice (cranberry or apple), 4 glucose tablets, OR glucose gel. Recheck blood sugar in 15 minutes after treatment (to make sure it is greater than 70 mg/dL). If your blood sugar is not greater than 70 mg/dL on recheck, call [phone] for further instructions. Report your blood sugar to the short stay nurse when you get to Short Stay.  If you are admitted to the  hospital after surgery: Your blood sugar will be checked by the staff and you will probably be given insulin after surgery (instead of oral diabetes medicines) to make sure you have good blood sugar levels. The goal for blood sugar control after surgery is 80-180 mg/dL.  Reviewed and Endorsed by [facility] Patient Education Committee, August 2015                               You may not have any metal on your body including hair pins, jewelry, and body piercing             Do not wear make-up, lotions, powders, perfumes, or deodorant  Do not wear nail polish including gel and S&S, artificial/acrylic nails, or any other type of covering on natural nails including finger and toenails. If you have artificial nails, gel coating, etc. that needs to be removed by a nail salon please have this removed prior to surgery or surgery may need to be canceled/ delayed if the surgeon/ anesthesia feels like they are unable to be safely monitored.   Do not shave  48 hours prior to surgery.    Do not bring valuables to the hospital. [facility] IS NOT             RESPONSIBLE   FOR VALUABLES.   Bring small overnight bag day of surgery.   DO NOT BRING YOUR HOME MEDICATIONS TO THE HOSPITAL. PHARMACY WILL DISPENSE MEDICATIONS LISTED ON YOUR MEDICATION LIST TO YOU DURING YOUR ADMISSION IN THE HOSPITAL!    Special Instructions: Bring a copy of your healthcare power of attorney and living will documents         the day of surgery if you haven't scanned them before.  Please read over the following fact sheets you were given: IF YOU HAVE QUESTIONS ABOUT YOUR PRE-OP INSTRUCTIONS PLEASE CALL [phone]- [facility] - Preparing for Surgery Before surgery, you can play an important role.  Because skin is not sterile, your skin needs to be as free of germs as possible.  You can reduce the number of germs on your skin by washing with CHG (chlorahexidine gluconate) soap before surgery.  CHG is an  antiseptic cleaner which kills germs and bonds with the skin to continue killing germs even after washing. Please DO NOT use if you have an allergy to CHG or antibacterial soaps.  If your skin becomes reddened/irritated stop using the CHG and inform your nurse when you arrive at Short Stay. Do not shave (including legs and underarms) for at least 48 hours prior to the first CHG shower.  You may shave your face/neck.  Please follow these instructions carefully:  1.  Shower with CHG Soap the night before surgery and the  morning of surgery.  2.  If you choose to wash your hair, wash your hair first as usual with your normal  shampoo.  3.  After you shampoo, rinse your hair and body thoroughly to remove the shampoo.                             4.  Use CHG as you would any other liquid soap.  You can apply chg directly to the skin and wash.  Gently with a scrungie or clean washcloth.  5.  Apply the CHG Soap to your body ONLY FROM THE NECK DOWN.   Do   not use on face/ open                           Wound or open sores. Avoid contact with eyes, ears mouth and   genitals (private parts).                       Wash face,  Genitals (private parts) with your normal soap.             6.  Wash thoroughly, paying special attention to the area where your    surgery  will be performed.  7.  Thoroughly rinse your body with warm water from the neck down.  8.  DO NOT shower/wash with your normal soap after using and rinsing off the CHG Soap.                9.  Pat yourself dry with a clean towel.            10.  Wear clean pajamas.            11.  Place clean sheets on your bed the night of your first shower and do not  sleep with pets. Day of Surgery : Do not apply any lotions/deodorants the morning of surgery.  Please wear clean clothes to the hospital/surgery center.  FAILURE TO FOLLOW THESE INSTRUCTIONS MAY RESULT IN THE CANCELLATION OF YOUR SURGERY  PATIENT  SIGNATURE_________________________________  NURSE SIGNATURE__________________________________  ________________________________________________________________________   Carla May  An incentive spirometer is a tool that can help keep your lungs clear and active. This tool measures how well you are filling your lungs with each breath. Taking long deep breaths may help reverse or decrease the chance of  developing breathing (pulmonary) problems (especially infection) following: A long period of time when you are unable to move or be active. BEFORE THE PROCEDURE  If the spirometer includes an indicator to show your best effort, your nurse or respiratory therapist will set it to a desired goal. If possible, sit up straight or lean slightly forward. Try not to slouch. Hold the incentive spirometer in an upright position. INSTRUCTIONS FOR USE  Sit on the edge of your bed if possible, or sit up as far as you can in bed or on a chair. Hold the incentive spirometer in an upright position. Breathe out normally. Place the mouthpiece in your mouth and seal your lips tightly around it. Breathe in slowly and as deeply as possible, raising the piston or the ball toward the top of the column. Hold your breath for 3-5 seconds or for as long as possible. Allow the piston or ball to fall to the bottom of the column. Remove the mouthpiece from your mouth and breathe out normally. Rest for a few seconds and repeat Steps 1 through 7 at least 10 times every 1-2 hours when you are awake. Take your time and take a few normal breaths between deep breaths. The spirometer may include an indicator to show your best effort. Use the indicator as a goal to work toward during each repetition. After each set of 10 deep breaths, practice coughing to be sure your lungs are clear. If you have an incision (the cut made at the time of surgery), support your incision when coughing by placing a pillow or rolled up towels  firmly against it. Once you are able to get out of bed, walk around indoors and cough well. You may stop using the incentive spirometer when instructed by your caregiver.  RISKS AND COMPLICATIONS Take your time so you do not get dizzy or light-headed. If you are in pain, you may need to take or ask for pain medication before doing incentive spirometry. It is harder to take a deep breath if you are having pain. AFTER USE Rest and breathe slowly and easily. It can be helpful to keep track of a log of your progress. Your caregiver can provide you with a simple table to help with this. If you are using the spirometer at home, follow these instructions: SEEK MEDICAL CARE IF:  You are having difficultly using the spirometer. You have trouble using the spirometer as often as instructed. Your pain medication is not giving enough relief while using the spirometer. You develop fever of 100.5 F (38.1 C) or higher. SEEK IMMEDIATE MEDICAL CARE IF:  You cough up bloody sputum that had not been present before. You develop fever of 102 F (38.9 C) or greater. You develop worsening pain at or near the incision site. MAKE SURE YOU:  Understand these instructions. Will watch your condition. Will get help right away if you are not doing well or get worse. Document Released: 03/14/2007 Document Revised: 01/24/2012 Document Reviewed: 05/15/2007 [facility] Patient Information 2014 [place], [place].   ________________________________________________________________________

## 2022-04-21 ENCOUNTER — Encounter (HOSPITAL_COMMUNITY): Payer: Self-pay

## 2022-04-21 ENCOUNTER — Encounter (HOSPITAL_COMMUNITY)
Admission: RE | Admit: 2022-04-21 | Discharge: 2022-04-21 | Disposition: A | Discharge status: Home / Self Care | Payer: Medicare Other | Source: Ambulatory Visit | Attending: Orthopedic Surgery | Admitting: Orthopedic Surgery

## 2022-04-21 VITALS — BP 165/84 | HR 65 | Temp 98.6°F | Resp 12 | Ht 65.0 in | Wt 158.0 lb

## 2022-04-21 DIAGNOSIS — Z01818 Encounter for other preprocedural examination: Secondary | ICD-10-CM | POA: Insufficient documentation

## 2022-04-21 DIAGNOSIS — E119 Type 2 diabetes mellitus without complications: Secondary | ICD-10-CM | POA: Diagnosis not present

## 2022-04-21 DIAGNOSIS — I2510 Atherosclerotic heart disease of native coronary artery without angina pectoris: Secondary | ICD-10-CM | POA: Diagnosis not present

## 2022-04-21 HISTORY — DX: Cardiac arrhythmia, unspecified: I49.9

## 2022-04-21 LAB — CBC
HCT: 41.9 % (ref 36.0–46.0)
Hemoglobin: 13.8 g/dL (ref 12.0–15.0)
MCH: 29.9 pg (ref 26.0–34.0)
MCHC: 32.9 g/dL (ref 30.0–36.0)
MCV: 90.7 fL (ref 80.0–100.0)
Platelets: 267 10*3/uL (ref 150–400)
RBC: 4.62 MIL/uL (ref 3.87–5.11)
RDW: 13.2 % (ref 11.5–15.5)
WBC: 8 10*3/uL (ref 4.0–10.5)
nRBC: 0 % (ref 0.0–0.2)

## 2022-04-21 LAB — BASIC METABOLIC PANEL
Anion gap: 8 (ref 5–15)
BUN: 17 mg/dL (ref 8–23)
CO2: 26 mmol/L (ref 22–32)
Calcium: 9.9 mg/dL (ref 8.9–10.3)
Chloride: 104 mmol/L (ref 98–111)
Creatinine, Ser: 0.64 mg/dL (ref 0.44–1.00)
GFR, Estimated: >60 mL/min (ref >60)
Glucose, Bld: 158 mg/dL — ABNORMAL HIGH (ref 70–99)
Potassium: 4.8 mmol/L (ref 3.5–5.1)
Sodium: 138 mmol/L (ref 135–145)

## 2022-04-21 LAB — SURGICAL PCR SCREEN
MRSA, PCR: POSITIVE — AB
Staphylococcus aureus: POSITIVE — AB

## 2022-04-21 LAB — GLUCOSE, CAPILLARY: Glucose-Capillary: 166 mg/dL — ABNORMAL HIGH (ref 70–99)

## 2022-04-21 LAB — HEMOGLOBIN A1C
Hgb A1c MFr Bld: 6.4 % — ABNORMAL HIGH (ref 4.8–5.6)
Mean Plasma Glucose: 136.98 mg/dL

## 2022-04-21 NOTE — Progress Notes (Signed)
[facility]+ MRSA+ results routed to Dr. Wynelle Link

## 2022-04-23 ENCOUNTER — Encounter (INDEPENDENT_AMBULATORY_CARE_PROVIDER_SITE_OTHER): Payer: Self-pay | Admitting: Ophthalmology

## 2022-04-23 ENCOUNTER — Other Ambulatory Visit (INDEPENDENT_AMBULATORY_CARE_PROVIDER_SITE_OTHER): Payer: Self-pay | Admitting: Ophthalmology

## 2022-05-03 ENCOUNTER — Ambulatory Visit (HOSPITAL_COMMUNITY): Payer: Medicare Other | Admitting: Anesthesiology

## 2022-05-03 ENCOUNTER — Ambulatory Visit (HOSPITAL_BASED_OUTPATIENT_CLINIC_OR_DEPARTMENT_OTHER): Payer: Medicare Other | Admitting: Anesthesiology

## 2022-05-03 ENCOUNTER — Other Ambulatory Visit: Payer: Self-pay

## 2022-05-03 ENCOUNTER — Observation Stay (HOSPITAL_COMMUNITY)
Admission: RE | Admit: 2022-05-03 | Discharge: 2022-05-04 | Disposition: A | Discharge status: Home / Self Care | Payer: Medicare Other | Source: Ambulatory Visit | Attending: Orthopedic Surgery | Admitting: Orthopedic Surgery

## 2022-05-03 ENCOUNTER — Encounter (HOSPITAL_COMMUNITY): Payer: Self-pay | Admitting: Orthopedic Surgery

## 2022-05-03 ENCOUNTER — Encounter (HOSPITAL_COMMUNITY)
Admission: RE | Disposition: A | Discharge status: Home / Self Care | Payer: Self-pay | Source: Ambulatory Visit | Attending: Orthopedic Surgery

## 2022-05-03 DIAGNOSIS — Z7982 Long term (current) use of aspirin: Secondary | ICD-10-CM | POA: Insufficient documentation

## 2022-05-03 DIAGNOSIS — M179 Osteoarthritis of knee, unspecified: Secondary | ICD-10-CM | POA: Diagnosis present

## 2022-05-03 DIAGNOSIS — M1712 Unilateral primary osteoarthritis, left knee: Secondary | ICD-10-CM | POA: Diagnosis present

## 2022-05-03 DIAGNOSIS — Z79899 Other long term (current) drug therapy: Secondary | ICD-10-CM | POA: Diagnosis not present

## 2022-05-03 DIAGNOSIS — Z87891 Personal history of nicotine dependence: Secondary | ICD-10-CM | POA: Insufficient documentation

## 2022-05-03 DIAGNOSIS — E119 Type 2 diabetes mellitus without complications: Secondary | ICD-10-CM | POA: Diagnosis not present

## 2022-05-03 HISTORY — PX: TOTAL KNEE ARTHROPLASTY: SHX125

## 2022-05-03 LAB — GLUCOSE, CAPILLARY
Glucose-Capillary: 158 mg/dL — ABNORMAL HIGH (ref 70–99)
Glucose-Capillary: 212 mg/dL — ABNORMAL HIGH (ref 70–99)

## 2022-05-03 SURGERY — ARTHROPLASTY, KNEE, TOTAL
Anesthesia: Monitor Anesthesia Care | Site: Knee | Laterality: Left

## 2022-05-03 MED ORDER — ROPIVACAINE HCL 5 MG/ML IJ SOLN
INTRAMUSCULAR | Status: DC | PRN
  Administered 2022-05-03: 30 mL via PERINEURAL

## 2022-05-03 MED ORDER — PHENYLEPHRINE 80 MCG/ML (10ML) SYRINGE FOR IV PUSH (FOR BLOOD PRESSURE SUPPORT)
PREFILLED_SYRINGE | INTRAVENOUS | Status: AC
Start: 2022-05-03 — End: ?
  Filled 2022-05-03: qty 10

## 2022-05-03 MED ORDER — LACTATED RINGERS IV SOLN: INTRAVENOUS | Status: DC

## 2022-05-03 MED ORDER — OXYCODONE HCL 5 MG/5ML PO SOLN: 5.0000 mg | Freq: Once | ORAL | Status: DC | PRN

## 2022-05-03 MED ORDER — ONDANSETRON HCL 4 MG/2ML IJ SOLN
INTRAMUSCULAR | Status: AC
  Filled 2022-05-03: qty 2

## 2022-05-03 MED ORDER — ONDANSETRON HCL 4 MG/2ML IJ SOLN
INTRAMUSCULAR | Status: DC | PRN
  Administered 2022-05-03: 4 mg via INTRAVENOUS

## 2022-05-03 MED ORDER — AMISULPRIDE (ANTIEMETIC) 5 MG/2ML IV SOLN: 10.0000 mg | Freq: Once | INTRAVENOUS | Status: DC | PRN

## 2022-05-03 MED ORDER — HYDROMORPHONE HCL 1 MG/ML IJ SOLN: 0.2500 mg | INTRAMUSCULAR | Status: DC | PRN

## 2022-05-03 MED ORDER — OMEPRAZOLE MAGNESIUM 20 MG PO TBEC: 20.0000 mg | DELAYED_RELEASE_TABLET | Freq: Every day | ORAL | Status: DC

## 2022-05-03 MED ORDER — PHENOL 1.4 % MT LIQD: 1.0000 | OROMUCOSAL | Status: DC | PRN

## 2022-05-03 MED ORDER — ORAL CARE MOUTH RINSE: 15.0000 mL | Freq: Once | OROMUCOSAL | Status: DC

## 2022-05-03 MED ORDER — ONDANSETRON HCL 4 MG PO TABS: 4.0000 mg | ORAL_TABLET | Freq: Four times a day (QID) | ORAL | Status: DC | PRN

## 2022-05-03 MED ORDER — METOCLOPRAMIDE HCL 5 MG/ML IJ SOLN: 5.0000 mg | Freq: Three times a day (TID) | INTRAMUSCULAR | Status: DC | PRN

## 2022-05-03 MED ORDER — CEFAZOLIN SODIUM-DEXTROSE 2-4 GM/100ML-% IV SOLN
2.0000 g | Freq: Once | INTRAVENOUS | Status: AC
  Administered 2022-05-03: 2 g via INTRAVENOUS
  Filled 2022-05-03: qty 100

## 2022-05-03 MED ORDER — BUPIVACAINE LIPOSOME 1.3 % IJ SUSP: 20.0000 mL | Freq: Once | INTRAMUSCULAR | Status: DC

## 2022-05-03 MED ORDER — INSULIN ASPART 100 UNIT/ML IJ SOLN
0.0000 [IU] | Freq: Three times a day (TID) | INTRAMUSCULAR | Status: DC
  Administered 2022-05-04 (×2): 2 [IU] via SUBCUTANEOUS

## 2022-05-03 MED ORDER — FLEET ENEMA 7-19 GM/118ML RE ENEM: 1.0000 | ENEMA | Freq: Once | RECTAL | Status: DC | PRN

## 2022-05-03 MED ORDER — SODIUM CHLORIDE 0.9 % IR SOLN
Status: DC | PRN
  Administered 2022-05-03: 1000 mL

## 2022-05-03 MED ORDER — SODIUM CHLORIDE 0.9 % IV SOLN: INTRAVENOUS | Status: DC

## 2022-05-03 MED ORDER — PHENYLEPHRINE HCL (PRESSORS) 10 MG/ML IV SOLN
INTRAVENOUS | Status: AC
  Filled 2022-05-03: qty 1

## 2022-05-03 MED ORDER — SODIUM CHLORIDE (PF) 0.9 % IJ SOLN
INTRAMUSCULAR | Status: DC | PRN
  Administered 2022-05-03: 60 mL via INTRAVENOUS

## 2022-05-03 MED ORDER — ACETAMINOPHEN 500 MG PO TABS
1000.0000 mg | ORAL_TABLET | Freq: Four times a day (QID) | ORAL | Status: AC
  Administered 2022-05-03 – 2022-05-04 (×4): 1000 mg via ORAL
  Filled 2022-05-03 (×4): qty 2

## 2022-05-03 MED ORDER — ONDANSETRON HCL 4 MG/2ML IJ SOLN: 4.0000 mg | Freq: Four times a day (QID) | INTRAMUSCULAR | Status: DC | PRN

## 2022-05-03 MED ORDER — KETOROLAC TROMETHAMINE 0.5 % OP SOLN
1.0000 [drp] | Freq: Two times a day (BID) | OPHTHALMIC | Status: DC
  Administered 2022-05-03: 1 [drp] via OPHTHALMIC
  Filled 2022-05-03: qty 5

## 2022-05-03 MED ORDER — POVIDONE-IODINE 10 % EX SWAB
2.0000 | Freq: Once | CUTANEOUS | Status: DC
Start: 2022-05-03 — End: 2022-05-03

## 2022-05-03 MED ORDER — DEXAMETHASONE SODIUM PHOSPHATE 10 MG/ML IJ SOLN
INTRAMUSCULAR | Status: DC | PRN
  Administered 2022-05-03: 10 mg

## 2022-05-03 MED ORDER — SODIUM CHLORIDE (PF) 0.9 % IJ SOLN
INTRAMUSCULAR | Status: AC
  Filled 2022-05-03: qty 50

## 2022-05-03 MED ORDER — BUPIVACAINE LIPOSOME 1.3 % IJ SUSP
INTRAMUSCULAR | Status: DC | PRN
  Administered 2022-05-03: 20 mL

## 2022-05-03 MED ORDER — POLYVINYL ALCOHOL 1.4 % OP SOLN: 1.0000 [drp] | Freq: Four times a day (QID) | OPHTHALMIC | Status: DC | PRN

## 2022-05-03 MED ORDER — DOCUSATE SODIUM 100 MG PO CAPS
100.0000 mg | ORAL_CAPSULE | Freq: Two times a day (BID) | ORAL | Status: DC
  Administered 2022-05-03 – 2022-05-04 (×2): 100 mg via ORAL
  Filled 2022-05-03 (×2): qty 1

## 2022-05-03 MED ORDER — BISACODYL 10 MG RE SUPP: 10.0000 mg | Freq: Every day | RECTAL | Status: DC | PRN

## 2022-05-03 MED ORDER — BUPIVACAINE IN DEXTROSE 0.75-8.25 % IT SOLN
INTRATHECAL | Status: DC | PRN
  Administered 2022-05-03: 1.6 mL via INTRATHECAL

## 2022-05-03 MED ORDER — PREDNISOLONE ACETATE 1 % OP SUSP
1.0000 [drp] | Freq: Four times a day (QID) | OPHTHALMIC | Status: DC
  Administered 2022-05-03 (×2): 1 [drp] via OPHTHALMIC
  Filled 2022-05-03: qty 5

## 2022-05-03 MED ORDER — PANTOPRAZOLE SODIUM 40 MG PO TBEC
40.0000 mg | DELAYED_RELEASE_TABLET | Freq: Every day | ORAL | Status: DC
  Filled 2022-05-03: qty 1

## 2022-05-03 MED ORDER — BUPIVACAINE LIPOSOME 1.3 % IJ SUSP
INTRAMUSCULAR | Status: AC
Start: 2022-05-03 — End: ?
  Filled 2022-05-03: qty 20

## 2022-05-03 MED ORDER — DEXAMETHASONE SODIUM PHOSPHATE 10 MG/ML IJ SOLN
8.0000 mg | Freq: Once | INTRAMUSCULAR | Status: AC
  Administered 2022-05-03: 8 mg via INTRAVENOUS

## 2022-05-03 MED ORDER — ASPIRIN 325 MG PO TBEC
325.0000 mg | DELAYED_RELEASE_TABLET | Freq: Two times a day (BID) | ORAL | Status: DC
  Administered 2022-05-04: 325 mg via ORAL
  Filled 2022-05-03: qty 1

## 2022-05-03 MED ORDER — ACETAMINOPHEN 10 MG/ML IV SOLN
1000.0000 mg | Freq: Four times a day (QID) | INTRAVENOUS | Status: DC
  Administered 2022-05-03: 1000 mg via INTRAVENOUS
  Filled 2022-05-03: qty 100

## 2022-05-03 MED ORDER — VANCOMYCIN HCL IN DEXTROSE 1-5 GM/200ML-% IV SOLN
1000.0000 mg | INTRAVENOUS | Status: AC
  Administered 2022-05-03: 1000 mg via INTRAVENOUS
  Filled 2022-05-03: qty 200

## 2022-05-03 MED ORDER — CEFAZOLIN SODIUM-DEXTROSE 2-4 GM/100ML-% IV SOLN
2.0000 g | INTRAVENOUS | Status: AC
  Administered 2022-05-03: 2 g via INTRAVENOUS
  Filled 2022-05-03: qty 100

## 2022-05-03 MED ORDER — MORPHINE SULFATE (PF) 2 MG/ML IV SOLN
1.0000 mg | INTRAVENOUS | Status: DC | PRN
  Administered 2022-05-03 (×2): 2 mg via INTRAVENOUS
  Filled 2022-05-03 (×2): qty 1

## 2022-05-03 MED ORDER — PHENYLEPHRINE HCL-NACL 20-0.9 MG/250ML-% IV SOLN
INTRAVENOUS | Status: DC | PRN
  Administered 2022-05-03: 35 ug/min via INTRAVENOUS

## 2022-05-03 MED ORDER — POLYETHYLENE GLYCOL 3350 17 G PO PACK: 17.0000 g | PACK | Freq: Every day | ORAL | Status: DC | PRN

## 2022-05-03 MED ORDER — METOCLOPRAMIDE HCL 5 MG PO TABS: 5.0000 mg | ORAL_TABLET | Freq: Three times a day (TID) | ORAL | Status: DC | PRN

## 2022-05-03 MED ORDER — PROPOFOL 1000 MG/100ML IV EMUL
INTRAVENOUS | Status: AC
  Filled 2022-05-03: qty 100

## 2022-05-03 MED ORDER — ONDANSETRON HCL 4 MG/2ML IJ SOLN: 4.0000 mg | Freq: Once | INTRAMUSCULAR | Status: DC | PRN

## 2022-05-03 MED ORDER — INSULIN ASPART 100 UNIT/ML IJ SOLN
0.0000 [IU] | Freq: Every day | INTRAMUSCULAR | Status: DC
  Administered 2022-05-03: 5 [IU] via SUBCUTANEOUS

## 2022-05-03 MED ORDER — TRANEXAMIC ACID-NACL 1000-0.7 MG/100ML-% IV SOLN
1000.0000 mg | INTRAVENOUS | Status: AC
  Administered 2022-05-03: 1000 mg via INTRAVENOUS
  Filled 2022-05-03: qty 100

## 2022-05-03 MED ORDER — MENTHOL 3 MG MT LOZG: 1.0000 | LOZENGE | OROMUCOSAL | Status: DC | PRN

## 2022-05-03 MED ORDER — METHOCARBAMOL 1000 MG/10ML IJ SOLN: 500.0000 mg | Freq: Four times a day (QID) | INTRAVENOUS | Status: DC | PRN

## 2022-05-03 MED ORDER — PROPOFOL 500 MG/50ML IV EMUL
INTRAVENOUS | Status: DC | PRN
  Administered 2022-05-03: 50 ug/kg/min via INTRAVENOUS

## 2022-05-03 MED ORDER — OXYCODONE HCL 5 MG PO TABS: 5.0000 mg | ORAL_TABLET | Freq: Once | ORAL | Status: DC | PRN

## 2022-05-03 MED ORDER — FENTANYL CITRATE PF 50 MCG/ML IJ SOSY
PREFILLED_SYRINGE | INTRAMUSCULAR | Status: AC
  Administered 2022-05-03: 100 ug
  Filled 2022-05-03: qty 2

## 2022-05-03 MED ORDER — METHOCARBAMOL 500 MG PO TABS
500.0000 mg | ORAL_TABLET | Freq: Four times a day (QID) | ORAL | Status: DC | PRN
  Administered 2022-05-03 – 2022-05-04 (×2): 500 mg via ORAL
  Filled 2022-05-03 (×2): qty 1

## 2022-05-03 MED ORDER — PROPOFOL 10 MG/ML IV BOLUS
INTRAVENOUS | Status: DC | PRN
  Administered 2022-05-03 (×2): 20 mg via INTRAVENOUS

## 2022-05-03 MED ORDER — OXYCODONE HCL 5 MG PO TABS
5.0000 mg | ORAL_TABLET | ORAL | Status: DC | PRN
  Administered 2022-05-03: 5 mg via ORAL
  Administered 2022-05-03 – 2022-05-04 (×3): 10 mg via ORAL
  Filled 2022-05-03: qty 1
  Filled 2022-05-03 (×3): qty 2

## 2022-05-03 MED ORDER — DIPHENHYDRAMINE HCL 12.5 MG/5ML PO ELIX: 12.5000 mg | ORAL_SOLUTION | ORAL | Status: DC | PRN

## 2022-05-03 MED ORDER — CHLORHEXIDINE GLUCONATE 0.12 % MT SOLN: 15.0000 mL | Freq: Once | OROMUCOSAL | Status: DC

## 2022-05-03 MED ORDER — TRAMADOL HCL 50 MG PO TABS: 50.0000 mg | ORAL_TABLET | Freq: Four times a day (QID) | ORAL | Status: DC | PRN

## 2022-05-03 MED ORDER — SODIUM CHLORIDE (PF) 0.9 % IJ SOLN
INTRAMUSCULAR | Status: AC
  Filled 2022-05-03: qty 10

## 2022-05-03 SURGICAL SUPPLY — 55 items
ATTUNE MED DOME PAT 38 KNEE (Knees) ×1 IMPLANT
ATTUNE PSFEM LTSZ5 NARCEM KNEE (Femur) ×1 IMPLANT
ATTUNE PSRP INSE SZ5 7 KNEE (Insert) ×1 IMPLANT
BAG COUNTER SPONGE SURGICOUNT (BAG) IMPLANT
BAG ZIPLOCK 12X15 (MISCELLANEOUS) ×2 IMPLANT
BASEPLATE TIBIAL ROTATING SZ 4 (Knees) ×1 IMPLANT
BLADE SAG 18X100X1.27 (BLADE) ×2 IMPLANT
BLADE SAW SGTL 11.0X1.19X90.0M (BLADE) ×2 IMPLANT
BNDG ELASTIC 6X5.8 VLCR STR LF (GAUZE/BANDAGES/DRESSINGS) ×2 IMPLANT
BOWL SMART MIX CTS (DISPOSABLE) ×2 IMPLANT
CEMENT HV SMART SET (Cement) ×4 IMPLANT
CLSR STERI-STRIP ANTIMIC 1/2X4 (GAUZE/BANDAGES/DRESSINGS) ×1 IMPLANT
COVER SURGICAL LIGHT HANDLE (MISCELLANEOUS) ×2 IMPLANT
CUFF TOURN SGL QUICK 34 (TOURNIQUET CUFF) ×2
CUFF TRNQT CYL 34X4.125X (TOURNIQUET CUFF) ×1 IMPLANT
DRAPE INCISE IOBAN 66X45 STRL (DRAPES) ×2 IMPLANT
DRAPE U-SHAPE 47X51 STRL (DRAPES) ×2 IMPLANT
DRSG AQUACEL AG ADV 3.5X10 (GAUZE/BANDAGES/DRESSINGS) ×2 IMPLANT
DURAPREP 26ML APPLICATOR (WOUND CARE) ×2 IMPLANT
ELECT REM PT RETURN 15FT ADLT (MISCELLANEOUS) ×2 IMPLANT
GLOVE BIO SURGEON STRL SZ 6.5 (GLOVE) IMPLANT
GLOVE BIO SURGEON STRL SZ7.5 (GLOVE) IMPLANT
GLOVE BIO SURGEON STRL SZ8 (GLOVE) ×2 IMPLANT
GLOVE BIOGEL PI IND STRL 6.5 (GLOVE) IMPLANT
GLOVE BIOGEL PI IND STRL 7.0 (GLOVE) IMPLANT
GLOVE BIOGEL PI IND STRL 8 (GLOVE) ×1 IMPLANT
GLOVE BIOGEL PI INDICATOR 6.5 (GLOVE)
GLOVE BIOGEL PI INDICATOR 7.0 (GLOVE)
GLOVE BIOGEL PI INDICATOR 8 (GLOVE) ×1
GOWN STRL REUS W/ TWL LRG LVL3 (GOWN DISPOSABLE) ×1 IMPLANT
GOWN STRL REUS W/ TWL XL LVL3 (GOWN DISPOSABLE) IMPLANT
GOWN STRL REUS W/TWL LRG LVL3 (GOWN DISPOSABLE) ×2
GOWN STRL REUS W/TWL XL LVL3 (GOWN DISPOSABLE)
HANDPIECE INTERPULSE COAX TIP (DISPOSABLE) ×2
HOLDER FOLEY CATH W/STRAP (MISCELLANEOUS) IMPLANT
IMMOBILIZER KNEE 20 (SOFTGOODS) ×2
IMMOBILIZER KNEE 20 THIGH 36 (SOFTGOODS) ×1 IMPLANT
KIT TURNOVER KIT A (KITS) IMPLANT
MANIFOLD NEPTUNE II (INSTRUMENTS) ×2 IMPLANT
NS IRRIG 1000ML POUR BTL (IV SOLUTION) ×2 IMPLANT
PACK TOTAL KNEE CUSTOM (KITS) ×2 IMPLANT
PADDING CAST COTTON 6X4 STRL (CAST SUPPLIES) ×3 IMPLANT
PROTECTOR NERVE ULNAR (MISCELLANEOUS) ×2 IMPLANT
SET HNDPC FAN SPRY TIP SCT (DISPOSABLE) ×1 IMPLANT
SPIKE FLUID TRANSFER (MISCELLANEOUS) ×2 IMPLANT
STRIP CLOSURE SKIN 1/2X4 (GAUZE/BANDAGES/DRESSINGS) ×4 IMPLANT
SUT MNCRL AB 4-0 PS2 18 (SUTURE) ×2 IMPLANT
SUT STRATAFIX 0 PDS 27 VIOLET (SUTURE) ×2
SUT VIC AB 2-0 CT1 27 (SUTURE) ×6
SUT VIC AB 2-0 CT1 TAPERPNT 27 (SUTURE) ×3 IMPLANT
SUTURE STRATFX 0 PDS 27 VIOLET (SUTURE) ×1 IMPLANT
TRAY FOLEY MTR SLVR 16FR STAT (SET/KITS/TRAYS/PACK) ×2 IMPLANT
TUBE SUCTION HIGH CAP CLEAR NV (SUCTIONS) ×2 IMPLANT
WATER STERILE IRR 1000ML POUR (IV SOLUTION) ×4 IMPLANT
WRAP KNEE MAXI GEL POST OP (GAUZE/BANDAGES/DRESSINGS) ×2 IMPLANT

## 2022-05-03 NOTE — Progress Notes (Signed)
PT Cancellation Note  Patient Details Name: AVNI TRAORE MRN: [id number] DOB: September 04, 1941   Cancelled Treatment:    Reason Eval/Treat Not Completed: (P) Pain limiting ability to participate. Pt reporting 8/10 pain as well as nausea, requesting come back later. Will follow up as schedule and pt status allows, which may be another day.   Jamesetta Geralds, PT, DPT [facility] Department Office: [phone] Pager: [phone]   Jamesetta Geralds 05/03/2022, 5:44 PM

## 2022-05-03 NOTE — Interval H&P Note (Signed)
History and Physical Interval Note:  05/03/2022 10:22 AM  [name]  has presented today for surgery, with the diagnosis of left knee osteoarthritis.  The various methods of treatment have been discussed with the patient and family. After consideration of risks, benefits and other options for treatment, the patient has consented to  Procedure(s): TOTAL KNEE ARTHROPLASTY (Left) as a surgical intervention.  The patient's history has been reviewed, patient examined, no change in status, stable for surgery.  I have reviewed the patient's chart and labs.  Questions were answered to the patient's satisfaction.     Carla May Malcomb Gangemi

## 2022-05-03 NOTE — Anesthesia Procedure Notes (Signed)
Procedure Name: MAC Date/Time: 05/03/2022 12:04 PM  Performed by: Maxwell Caul, CRNAPre-anesthesia Checklist: Patient identified, Emergency Drugs available, Suction available and Patient being monitored Oxygen Delivery Method: Simple face mask

## 2022-05-03 NOTE — Anesthesia Procedure Notes (Signed)
Anesthesia Regional Block: Adductor canal block   Pre-Anesthetic Checklist: , timeout performed,  Correct Patient, Correct Site, Correct Laterality,  Correct Procedure, Correct Position, site marked,  Risks and benefits discussed,  Surgical consent,  Pre-op evaluation,  At surgeon's request and post-op pain management  Laterality: Left  Prep: Maximum Sterile Barrier Precautions used, chloraprep       Needles:  Injection technique: Single-shot  Needle Type: Echogenic Stimulator Needle     Needle Length: 9cm  Needle Gauge: 22     Additional Needles:   Procedures:,,,, ultrasound used (permanent image in chart),,    Narrative:  Start time: 05/03/2022 11:25 AM End time: 05/03/2022 11:30 AM Injection made incrementally with aspirations every 5 mL.  Performed by: Personally  Anesthesiologist: Lannie Fields, DO  Additional Notes: Monitors applied. No increased pain on injection. No increased resistance to injection. Injection made in 5cc increments. Good needle visualization. Patient tolerated procedure well.

## 2022-05-03 NOTE — Anesthesia Postprocedure Evaluation (Signed)
Anesthesia Post Note  Patient: Carla May  Procedure(s) Performed: TOTAL KNEE ARTHROPLASTY (Left: Knee)     Patient location during evaluation: PACU Anesthesia Type: Regional, MAC and Spinal Level of consciousness: awake and alert and oriented Pain management: pain level controlled Vital Signs Assessment: post-procedure vital signs reviewed and stable Respiratory status: spontaneous breathing, nonlabored ventilation and respiratory function stable Cardiovascular status: blood pressure returned to baseline and stable Postop Assessment: no headache, no backache, spinal receding and patient able to bend at knees Anesthetic complications: no   No notable events documented.  Last Vitals:  Vitals:   05/03/22 1124 05/03/22 1347  BP: (!) 153/77 125/67  Pulse: 98 93  Resp: 15 13  Temp:  (!) 36.4 C  SpO2: 99% 100%    Last Pain:  Vitals:   05/03/22 1034  TempSrc: Oral  PainSc: 0-No pain                 Pervis Hocking

## 2022-05-03 NOTE — Progress Notes (Signed)
AssistedDr. Beth Finucane with left, adductor canal block. Side rails up, monitors on throughout procedure. See vital signs in flow sheet. Tolerated Procedure well.  

## 2022-05-03 NOTE — Plan of Care (Signed)
  Problem: Education: Goal: Knowledge of General Education information will improve Description: Including pain rating scale, medication(s)/side effects and non-pharmacologic comfort measures Outcome: Progressing   Problem: Activity: Goal: Risk for activity intolerance will decrease Outcome: Progressing   Problem: Nutrition: Goal: Adequate nutrition will be maintained Outcome: Progressing   Problem: Elimination: Goal: Will not experience complications related to bowel motility Outcome: Progressing   Problem: Pain Managment: Goal: General experience of comfort will improve Outcome: Progressing   Problem: Safety: Goal: Ability to remain free from injury will improve Outcome: Progressing   Problem: Education: Goal: Knowledge of the prescribed therapeutic regimen will improve Outcome: Progressing   Problem: Activity: Goal: Ability to avoid complications of mobility impairment will improve Outcome: Progressing   Problem: Pain Management: Goal: Pain level will decrease with appropriate interventions Outcome: Progressing   Problem: Education: Goal: Knowledge of General Education information will improve Description: Including pain rating scale, medication(s)/side effects and non-pharmacologic comfort measures Outcome: Progressing   Problem: Activity: Goal: Risk for activity intolerance will decrease Outcome: Progressing   Problem: Elimination: Goal: Will not experience complications related to bowel motility Outcome: Progressing   Problem: Pain Managment: Goal: General experience of comfort will improve Outcome: Progressing   Problem: Safety: Goal: Ability to remain free from injury will improve Outcome: Progressing

## 2022-05-03 NOTE — Discharge Instructions (Signed)
 Frank Aluisio, MD Total Joint Specialist [facility] [address] [place], [place] [address] [phone]  TOTAL KNEE REPLACEMENT POSTOPERATIVE DIRECTIONS    Knee Rehabilitation, Guidelines Following Surgery  Results after knee surgery are often greatly improved when you follow the exercise, range of motion and muscle strengthening exercises prescribed by your doctor. Safety measures are also important to protect the knee from further injury. If any of these exercises cause you to have increased pain or swelling in your knee joint, decrease the amount until you are comfortable again and slowly increase them. If you have problems or questions, call your caregiver or physical therapist for advice.   BLOOD CLOT PREVENTION Take a 325 mg Aspirin two times a day for three weeks following surgery. Then resume one 81 mg Aspirin once a day. You may resume your vitamins/supplements upon discharge from the hospital. Do not take any NSAIDs (Advil, Aleve, Ibuprofen, Meloxicam, etc.) until you have discontinued the 325 mg Aspirin.  HOME CARE INSTRUCTIONS  Remove items at home which could result in a fall. This includes throw rugs or furniture in walking pathways.  ICE to the affected knee as much as tolerated. Icing helps control swelling. If the swelling is well controlled you will be more comfortable and rehab easier. Continue to use ice on the knee for pain and swelling from surgery. You may notice swelling that will progress down to the foot and ankle. This is normal after surgery. Elevate the leg when you are not up walking on it.    Continue to use the breathing machine which will help keep your temperature down. It is common for your temperature to cycle up and down following surgery, especially at night when you are not up moving around and exerting yourself. The breathing machine keeps your lungs expanded and your temperature down. Do not place pillow under the  operative knee, focus on keeping the knee straight while resting  DIET You may resume your previous home diet once you are discharged from the hospital.  DRESSING / WOUND CARE / SHOWERING Keep your bulky bandage on for 2 days. On the third post-operative day you may remove the Ace bandage and gauze. There is a waterproof adhesive bandage on your skin which will stay in place until your first follow-up appointment. Once you remove this you will not need to place another bandage You may begin showering 3 days following surgery, but do not submerge the incision under water.  ACTIVITY For the first 5 days, the key is rest and control of pain and swelling Do your home exercises twice a day starting on post-operative day 3. On the days you go to physical therapy, just do the home exercises once that day. You should rest, ice and elevate the leg for 50 minutes out of every hour. Get up and walk/stretch for 10 minutes per hour. After 5 days you can increase your activity slowly as tolerated. Walk with your walker as instructed. Use the walker until you are comfortable transitioning to a cane. Walk with the cane in the opposite hand of the operative leg. You may discontinue the cane once you are comfortable and walking steadily. Avoid periods of inactivity such as sitting longer than an hour when not asleep. This helps prevent blood clots.  You may discontinue the knee immobilizer once you are able to perform a straight leg raise while lying down. You may resume a sexual relationship in one month or when given the OK by   your doctor.  You may return to work once you are cleared by your doctor.  Do not drive a car for 6 weeks or until released by your surgeon.  Do not drive while taking narcotics.  TED HOSE STOCKINGS Wear the elastic stockings on both legs for three weeks following surgery during the day. You may remove them at night for sleeping.  WEIGHT BEARING Weight bearing as tolerated with assist  device (walker, cane, etc) as directed, use it as long as suggested by your surgeon or therapist, typically at least 4-6 weeks.  POSTOPERATIVE CONSTIPATION PROTOCOL Constipation - defined medically as fewer than three stools per week and severe constipation as less than one stool per week.  One of the most common issues patients have following surgery is constipation.  Even if you have a regular bowel pattern at home, your normal regimen is likely to be disrupted due to multiple reasons following surgery.  Combination of anesthesia, postoperative narcotics, change in appetite and fluid intake all can affect your bowels.  In order to avoid complications following surgery, here are some recommendations in order to help you during your recovery period.  Colace (docusate) - Pick up an over-the-counter form of Colace or another stool softener and take twice a day as long as you are requiring postoperative pain medications.  Take with a full glass of water daily.  If you experience loose stools or diarrhea, hold the colace until you stool forms back up. If your symptoms do not get better within 1 week or if they get worse, check with your doctor. Dulcolax (bisacodyl) - Pick up over-the-counter and take as directed by the product packaging as needed to assist with the movement of your bowels.  Take with a full glass of water.  Use this product as needed if not relieved by Colace only.  MiraLax (polyethylene glycol) - Pick up over-the-counter to have on hand. MiraLax is a solution that will increase the amount of water in your bowels to assist with bowel movements.  Take as directed and can mix with a glass of water, juice, soda, coffee, or tea. Take if you go more than two days without a movement. Do not use MiraLax more than once per day. Call your doctor if you are still constipated or irregular after using this medication for 7 days in a row.  If you continue to have problems with postoperative constipation,  please contact the office for further assistance and recommendations.  If you experience "the worst abdominal pain ever" or develop nausea or vomiting, please contact the office immediatly for further recommendations for treatment.  ITCHING If you experience itching with your medications, try taking only a single pain pill, or even half a pain pill at a time.  You can also use Benadryl over the counter for itching or also to help with sleep.   MEDICATIONS See your medication summary on the "After Visit Summary" that the nursing staff will review with you prior to discharge.  You may have some home medications which will be placed on hold until you complete the course of blood thinner medication.  It is important for you to complete the blood thinner medication as prescribed by your surgeon.  Continue your approved medications as instructed at time of discharge.  PRECAUTIONS If you experience chest pain or shortness of breath - call 911 immediately for transfer to the hospital emergency department.  If you develop a fever greater that 101 F, purulent drainage from wound, increased redness   or drainage from wound, foul odor from the wound/dressing, or calf pain - CONTACT YOUR SURGEON.                                                   FOLLOW-UP APPOINTMENTS Make sure you keep all of your appointments after your operation with your surgeon and caregivers. You should call the office at the above phone number and make an appointment for approximately two weeks after the date of your surgery or on the date instructed by your surgeon outlined in the "After Visit Summary".  RANGE OF MOTION AND STRENGTHENING EXERCISES  Rehabilitation of the knee is important following a knee injury or an operation. After just a few days of immobilization, the muscles of the thigh which control the knee become weakened and shrink (atrophy). Knee exercises are designed to build up the tone and strength of the thigh muscles and to  improve knee motion. Often times heat used for twenty to thirty minutes before working out will loosen up your tissues and help with improving the range of motion but do not use heat for the first two weeks following surgery. These exercises can be done on a training (exercise) mat, on the floor, on a table or on a bed. Use what ever works the best and is most comfortable for you Knee exercises include:  Leg Lifts - While your knee is still immobilized in a splint or cast, you can do straight leg raises. Lift the leg to 60 degrees, hold for 3 sec, and slowly lower the leg. Repeat 10-20 times 2-3 times daily. Perform this exercise against resistance later as your knee gets better.  Quad and Hamstring Sets - Tighten up the muscle on the front of the thigh (Quad) and hold for 5-10 sec. Repeat this 10-20 times hourly. Hamstring sets are done by pushing the foot backward against an object and holding for 5-10 sec. Repeat as with quad sets.  Leg Slides: Lying on your back, slowly slide your foot toward your buttocks, bending your knee up off the floor (only go as far as is comfortable). Then slowly slide your foot back down until your leg is flat on the floor again. Angel Wings: Lying on your back spread your legs to the side as far apart as you can without causing discomfort.  A rehabilitation program following serious knee injuries can speed recovery and prevent re-injury in the future due to weakened muscles. Contact your doctor or a physical therapist for more information on knee rehabilitation.   POST-OPERATIVE OPIOID TAPER INSTRUCTIONS: It is important to wean off of your opioid medication as soon as possible. If you do not need pain medication after your surgery it is ok to stop day one. Opioids include: Codeine, Hydrocodone(Norco, Vicodin), Oxycodone(Percocet, oxycontin) and hydromorphone amongst others.  Long term and even short term use of opiods can cause: Increased pain  response Dependence Constipation Depression Respiratory depression And more.  Withdrawal symptoms can include Flu like symptoms Nausea, vomiting And more Techniques to manage these symptoms Hydrate well Eat regular healthy meals Stay active Use relaxation techniques(deep breathing, meditating, yoga) Do Not substitute Alcohol to help with tapering If you have been on opioids for less than two weeks and do not have pain than it is ok to stop all together.  Plan to wean off of opioids This   plan should start within one week post op of your joint replacement. Maintain the same interval or time between taking each dose and first decrease the dose.  Cut the total daily intake of opioids by one tablet each day Next start to increase the time between doses. The last dose that should be eliminated is the evening dose.   IF YOU ARE TRANSFERRED TO A SKILLED REHAB FACILITY If the patient is transferred to a skilled rehab facility following release from the hospital, a list of the current medications will be sent to the facility for the patient to continue.  When discharged from the skilled rehab facility, please have the facility set up the patient's Home Health Physical Therapy prior to being released. Also, the skilled facility will be responsible for providing the patient with their medications at time of release from the facility to include their pain medication, the muscle relaxants, and their blood thinner medication. If the patient is still at the rehab facility at time of the two week follow up appointment, the skilled rehab facility will also need to assist the patient in arranging follow up appointment in our office and any transportation needs.  MAKE SURE YOU:  Understand these instructions.  Get help right away if you are not doing well or get worse.   DENTAL ANTIBIOTICS:  In most cases prophylactic antibiotics for Dental procdeures after total joint surgery are not  necessary.  Exceptions are as follows:  1. History of prior total joint infection  2. Severely immunocompromised (Organ Transplant, cancer chemotherapy, Rheumatoid biologic medications such as Humera)  3. Poorly controlled diabetes (A1C &gt; 8.0, blood glucose over 200)  If you have one of these conditions, contact your surgeon for an antibiotic prescription, prior to your dental procedure.    Pick up stool softner and laxative for home use following surgery while on pain medications. Do not submerge incision under water. Please use good hand washing techniques while changing dressing each day. May shower starting three days after surgery. Please use a clean towel to pat the incision dry following showers. Continue to use ice for pain and swelling after surgery. Do not use any lotions or creams on the incision until instructed by your surgeon.  

## 2022-05-03 NOTE — Progress Notes (Signed)
Orthopedic Tech Progress Note Patient Details:  Carla May February 19, 1941 [id number] CPM was unable to be applied at this time due her pain level. Patient ID: Carla May, female   DOB: [birth year], 81 y.o.   MRN: [id number]   Smitty Pluck 05/03/2022, 4:35 PM

## 2022-05-03 NOTE — Transfer of Care (Signed)
Immediate Anesthesia Transfer of Care Note  Patient: Carla May  Procedure(s) Performed: TOTAL KNEE ARTHROPLASTY (Left: Knee)  Patient Location: PACU  Anesthesia Type:Spinal  Level of Consciousness: awake  Airway & Oxygen Therapy: Patient Spontanous Breathing  Post-op Assessment: Report given to RN and Post -op Vital signs reviewed and stable  Post vital signs: Reviewed and stable  Last Vitals:  Vitals Value Taken Time  BP    Temp    Pulse    Resp    SpO2      Last Pain:  Vitals:   05/03/22 1034  TempSrc: Oral  PainSc: 0-No pain      Patients Stated Pain Goal: 4 (05/03/22 1034)  Complications: No notable events documented.

## 2022-05-03 NOTE — Op Note (Signed)
OPERATIVE REPORT-TOTAL KNEE ARTHROPLASTY   Pre-operative diagnosis- Osteoarthritis  Left knee(s)  Post-operative diagnosis- Osteoarthritis Left knee(s)  Procedure-  Left  Total Knee Arthroplasty  Surgeon- Gus Rankin. Shanieka Blea, MD  Assistant- Arcola Jansky, PA-C   Anesthesia-   Adductor canal block and spinal  EBL- 25 ml   Drains None  Tourniquet time-  Total Tourniquet Time Documented: Thigh (Left) - 36 minutes Total: Thigh (Left) - 36 minutes     Complications- None  Condition-PACU - hemodynamically stable.   Brief Clinical Note  Carla May is a 81 y.o. year old female with end stage OA of her left knee with progressively worsening pain and dysfunction. She has constant pain, with activity and at rest and significant functional deficits with difficulties even with ADLs. She has had extensive non-op management including analgesics, injections of cortisone and viscosupplements, and home exercise program, but remains in significant pain with significant dysfunction. Radiographs show bone on bone arthritis lateral and patellofemoral. She presents now for left Total Knee Arthroplasty.     Procedure in detail---   The patient is brought into the operating room and positioned supine on the operating table. After successful administration of  adductor canal block and spinal,   a tourniquet is placed high on the  Left thigh(s) and the lower extremity is prepped and draped in the usual sterile fashion. Time out is performed by the operating team and then the  Left lower extremity is wrapped in Esmarch, knee flexed and the tourniquet inflated to 300 mmHg.       A midline incision is made with a ten blade through the subcutaneous tissue to the level of the extensor mechanism. A fresh blade is used to make a medial parapatellar arthrotomy. Soft tissue over the proximal medial tibia is subperiosteally elevated to the joint line with a knife and into the semimembranosus bursa with a Cobb  elevator. Soft tissue over the proximal lateral tibia is elevated with attention being paid to avoiding the patellar tendon on the tibial tubercle. The patella is everted, knee flexed 90 degrees and the ACL and PCL are removed. Findings are bone on bone lateral and patellofemoral with large global osteophytes        The drill is used to create a starting hole in the distal femur and the canal is thoroughly irrigated with sterile saline to remove the fatty contents. The 5 degree Left  valgus alignment guide is placed into the femoral canal and the distal femoral cutting block is pinned to remove 9 mm off the distal femur. Resection is made with an oscillating saw.      The tibia is subluxed forward and the menisci are removed. The extramedullary alignment guide is placed referencing proximally at the medial aspect of the tibial tubercle and distally along the second metatarsal axis and tibial crest. The block is pinned to remove 40mm off the more deficient lateral  side. Resection is made with an oscillating saw. Size 4is the most appropriate size for the tibia and the proximal tibia is prepared with the modular drill and keel punch for that size.      The femoral sizing guide is placed and size 5 is most appropriate. Rotation is marked off the epicondylar axis and confirmed by creating a rectangular flexion gap at 90 degrees. The size 5 cutting block is pinned in this rotation and the anterior, posterior and chamfer cuts are made with the oscillating saw. The intercondylar block is then placed and that cut  is made.      Trial size 4 tibial component, trial size 5 narrow posterior stabilized femur and a 7  mm posterior stabilized rotating platform insert trial is placed. Full extension is achieved with excellent varus/valgus and anterior/posterior balance throughout full range of motion. The patella is everted and thickness measured to be 24  mm. Free hand resection is taken to 14 mm, a 38 template is placed, lug  holes are drilled, trial patella is placed, and it tracks normally. Osteophytes are removed off the posterior femur with the trial in place. All trials are removed and the cut bone surfaces prepared with pulsatile lavage. Cement is mixed and once ready for implantation, the size 4 tibial implant, size  5 narrow posterior stabilized femoral component, and the size 38 patella are cemented in place and the patella is held with the clamp. The trial insert is placed and the knee held in full extension. The Exparel (20 ml mixed with 60 ml saline) is injected into the extensor mechanism, posterior capsule, medial and lateral gutters and subcutaneous tissues.  All extruded cement is removed and once the cement is hard the permanent 7 mm posterior stabilized rotating platform insert is placed into the tibial tray.      The wound is copiously irrigated with saline solution and the extensor mechanism closed with # 0 Stratofix suture. The tourniquet is released for a total tourniquet time of 35  minutes. Flexion against gravity is 140 degrees and the patella tracks normally. Subcutaneous tissue is closed with 2.0 vicryl and subcuticular with running 4.0 Monocryl. The incision is cleaned and dried and steri-strips and a bulky sterile dressing are applied. The limb is placed into a knee immobilizer and the patient is awakened and transported to recovery in stable condition.      Please note that a surgical assistant was a medical necessity for this procedure in order to perform it in a safe and expeditious manner. Surgical assistant was necessary to retract the ligaments and vital neurovascular structures to prevent injury to them and also necessary for proper positioning of the limb to allow for anatomic placement of the prosthesis.   Gus Rankin Reeve Mallo, MD    05/03/2022, 1:16 PM

## 2022-05-03 NOTE — Anesthesia Procedure Notes (Signed)
Spinal  Patient location during procedure: OR End time: 05/03/2022 12:11 PM Reason for block: surgical anesthesia Staffing Performed: resident/CRNA  Anesthesiologist: Pervis Hocking, DO Resident/CRNA: Maxwell Caul, CRNA Performed by: Maxwell Caul, CRNA Authorized by: Pervis Hocking, DO   Preanesthetic Checklist Completed: patient identified, IV checked, site marked, risks and benefits discussed, surgical consent, monitors and equipment checked, pre-op evaluation and timeout performed Spinal Block Patient position: sitting Prep: DuraPrep Patient monitoring: heart rate, cardiac monitor, continuous pulse ox and blood pressure Approach: midline Location: L3-4 Injection technique: single-shot Needle Needle type: Pencan  Needle gauge: 24 G Needle length: 10 cm Assessment Sensory level: T4 Events: CSF return Additional Notes IV functioning, monitors applied to pt. Expiration date of kit checked and confirmed to be in date. Sterile prep and drape, hand hygiene and sterile gloves used. Pt was positioned and spine was prepped in sterile fashion. Skin was anesthetized with lidocaine. Free flow of clear CSF obtained prior to injecting local anesthetic into CSF x 1 attempt. Spinal needle aspirated freely following injection. Needle was carefully withdrawn, and pt tolerated procedure well. Loss of motor and sensory on exam post injection. Dr Doroteo Glassman at bedside for entire placement.

## 2022-05-03 NOTE — Anesthesia Preprocedure Evaluation (Addendum)
Anesthesia Evaluation  Patient identified by MRN, date of birth, ID band Patient awake    Reviewed: Allergy & Precautions, NPO status , Patient's Chart, lab work & pertinent test results  History of Anesthesia Complications (+) PONV and history of anesthetic complications  Airway Mallampati: II  TM Distance: >3 FB Neck ROM: Full    Dental no notable dental hx.    Pulmonary former smoker,  Quit smoking 1970, 5 pack year history    Pulmonary exam normal breath sounds clear to auscultation       Cardiovascular hypertension (150/65 in preop, normally 120s-130s, no meds), Normal cardiovascular exam Rhythm:Regular Rate:Normal     Neuro/Psych PSYCHIATRIC DISORDERS Anxiety negative neurological ROS     GI/Hepatic Neg liver ROS, GERD  Controlled and Medicated,  Endo/Other  diabetes, Well Controlled  Renal/GU negative Renal ROS  negative genitourinary   Musculoskeletal  (+) Arthritis , Osteoarthritis,    Abdominal   Peds  Hematology negative hematology ROS (+) Hb 13.8, plt 267   Anesthesia Other Findings   Reproductive/Obstetrics negative OB ROS                            Anesthesia Physical Anesthesia Plan  ASA: 2  Anesthesia Plan: Spinal, Regional and MAC   Post-op Pain Management: Regional block* and Ofirmev IV (intra-op)*   Induction:   PONV Risk Score and Plan: 2 and Propofol infusion and TIVA  Airway Management Planned: Natural Airway and Nasal Cannula  Additional Equipment: None  Intra-op Plan:   Post-operative Plan:   Informed Consent: I have reviewed the patients History and Physical, chart, labs and discussed the procedure including the risks, benefits and alternatives for the proposed anesthesia with the patient or authorized representative who has indicated his/her understanding and acceptance.       Plan Discussed with: CRNA  Anesthesia Plan Comments:          Anesthesia Quick Evaluation

## 2022-05-03 NOTE — Progress Notes (Signed)
Patient up to chair did well with walker and stand by assist, bed changed, foley leaked. DFranklinRN

## 2022-05-04 ENCOUNTER — Encounter (HOSPITAL_COMMUNITY): Payer: Self-pay | Admitting: Orthopedic Surgery

## 2022-05-04 DIAGNOSIS — M1712 Unilateral primary osteoarthritis, left knee: Secondary | ICD-10-CM | POA: Diagnosis not present

## 2022-05-04 LAB — BASIC METABOLIC PANEL
Anion gap: 5 (ref 5–15)
BUN: 22 mg/dL (ref 8–23)
CO2: 25 mmol/L (ref 22–32)
Calcium: 9.1 mg/dL (ref 8.9–10.3)
Chloride: 107 mmol/L (ref 98–111)
Creatinine, Ser: 0.73 mg/dL (ref 0.44–1.00)
GFR, Estimated: >60 mL/min (ref >60)
Glucose, Bld: 143 mg/dL — ABNORMAL HIGH (ref 70–99)
Potassium: 4.4 mmol/L (ref 3.5–5.1)
Sodium: 137 mmol/L (ref 135–145)

## 2022-05-04 LAB — GLUCOSE, CAPILLARY
Glucose-Capillary: 148 mg/dL — ABNORMAL HIGH (ref 70–99)
Glucose-Capillary: 148 mg/dL — ABNORMAL HIGH (ref 70–99)

## 2022-05-04 LAB — CBC
HCT: 37 % (ref 36.0–46.0)
Hemoglobin: 12.3 g/dL (ref 12.0–15.0)
MCH: 29.9 pg (ref 26.0–34.0)
MCHC: 33.2 g/dL (ref 30.0–36.0)
MCV: 89.8 fL (ref 80.0–100.0)
Platelets: 257 10*3/uL (ref 150–400)
RBC: 4.12 MIL/uL (ref 3.87–5.11)
RDW: 12.6 % (ref 11.5–15.5)
WBC: 16.3 10*3/uL — ABNORMAL HIGH (ref 4.0–10.5)
nRBC: 0 % (ref 0.0–0.2)

## 2022-05-04 MED ORDER — TRAMADOL HCL 50 MG PO TABS: 50.0000 mg | ORAL_TABLET | Freq: Four times a day (QID) | ORAL | 0 refills | Status: DC | PRN

## 2022-05-04 MED ORDER — TIZANIDINE HCL 4 MG PO TABS: 4.0000 mg | ORAL_TABLET | Freq: Three times a day (TID) | ORAL | 0 refills | Status: DC | PRN

## 2022-05-04 MED ORDER — OXYCODONE HCL 5 MG PO TABS: 5.0000 mg | ORAL_TABLET | Freq: Four times a day (QID) | ORAL | 0 refills | Status: DC | PRN

## 2022-05-04 MED ORDER — ASPIRIN 325 MG PO TBEC: 325.0000 mg | DELAYED_RELEASE_TABLET | Freq: Two times a day (BID) | ORAL | 0 refills | Status: AC

## 2022-05-04 NOTE — Progress Notes (Signed)
Physical Therapy Treatment Patient Details Name: Carla May MRN: [id number] DOB: Feb 12, 1941 Today's Date: 05/04/2022   History of Present Illness Pt is a 81yo female presenting s/p L-TKA on 05/03/22. PMH: DM, GERD, HLD, PONV, OA, anxiety, L-TKA.    PT Comments    Pt seen for second session POD1. Pt min guard for all mobility tasks today including stair training and ambulation in hallway with RW. Provided HEP and pt completed with multimodal cuing and use of gait belt for heel slides and straight leg raise. Encouraged pt to complete walking program every hour. Educated pt and daughter on use of knee immobilizer especially for discharge home and stairs. All education has been completed and the patient has no further questions. Pt has met mobility goals for safe discharge home; PT is signing off. Thank you for this referral.   Recommendations for follow up therapy are one component of a multi-disciplinary discharge planning process, led by the attending physician.  Recommendations may be updated based on patient status, additional functional criteria and insurance authorization.  Follow Up Recommendations  Follow physician's recommendations for discharge plan and follow up therapies     Assistance Recommended at Discharge Intermittent Supervision/Assistance  Patient can return home with the following A little help with walking and/or transfers;A little help with bathing/dressing/bathroom;Assistance with cooking/housework;Assist for transportation;Help with stairs or ramp for entrance   Equipment Recommendations  None recommended by PT (Pt has recommended DME)    Recommendations for Other Services       Precautions / Restrictions Precautions Precautions: Fall Restrictions Weight Bearing Restrictions: No Other Position/Activity Restrictions: WBAT     Mobility  Bed Mobility Overal bed mobility: Needs Assistance Bed Mobility: Sit to Supine     Supine to sit: Supervision Sit to  supine: Supervision   General bed mobility comments: For safety only, no physical assist required    Transfers Overall transfer level: Needs assistance Equipment used: Rolling walker (2 wheels) Transfers: Sit to/from Stand, Bed to chair/wheelchair/BSC Sit to Stand: Min guard   Step pivot transfers: Min guard       General transfer comment: Min guard for safety only, no pysical assist required, multimodal cues for sequencing and powering up through BUE and RLE.    Ambulation/Gait Ambulation/Gait assistance: Supervision Gait Distance (Feet): 40 Feet Assistive device: Rolling walker (2 wheels) Gait Pattern/deviations: Step-to pattern Gait velocity: decreased     General Gait Details: Pt ambulated 42f with RW and min guard assist, no physical assist required or overt LOB noted, just to stairs and back to bed   Stairs Stairs: Yes Stairs assistance: Min guard Stair Management: No rails, Step to pattern, Backwards, With walker Number of Stairs: 2 General stair comments: Pt and daughter educated on safe stair technique using RW, verbalized understanding. Demonstrated safe technique with multimodal cuing for sequencing, daughter providing gaurding and min assist to stabilize walker, PT providing additional min guard for safety, no overt LOB.   Wheelchair Mobility    Modified Rankin (Stroke Patients Only)       Balance                                            Cognition Arousal/Alertness: Awake/alert Behavior During Therapy: WFL for tasks assessed/performed Overall Cognitive Status: Within Functional Limits for tasks assessed  Exercises Total Joint Exercises Ankle Circles/Pumps: AROM, Both, 20 reps, Seated Quad Sets: AROM, Left, 10 reps, Supine Short Arc Quad: AROM, Left, 10 reps Heel Slides: AAROM, Left, 10 reps Hip ABduction/ADduction: AROM, Left, 10 reps Straight Leg Raises: AAROM,  Left, 10 reps, Supine Other Exercises Other Exercises: Incentive spirometry x5, VCs for slow and controlled Other Exercises: EDucated pt and daughter on car transfer with demonstrations.    General Comments        Pertinent Vitals/Pain Pain Assessment Pain Assessment: 0-10 Pain Score: 4  Pain Location: Left knee Pain Descriptors / Indicators: Operative site guarding Pain Intervention(s): Limited activity within patient's tolerance, Monitored during session, Repositioned, Ice applied    Home Living                          Prior Function            PT Goals (current goals can now be found in the care plan section) Acute Rehab PT Goals Patient Stated Goal: To walk without pain PT Goal Formulation: With patient Time For Goal Achievement: 05/11/22 Potential to Achieve Goals: Good Progress towards PT goals: Progressing toward goals    Frequency    7X/week      PT Plan Current plan remains appropriate    Co-evaluation              AM-PAC PT "6 Clicks" Mobility   Outcome Measure  Help needed turning from your back to your side while in a flat bed without using bedrails?: None Help needed moving from lying on your back to sitting on the side of a flat bed without using bedrails?: None Help needed moving to and from a bed to a chair (including a wheelchair)?: A Little Help needed standing up from a chair using your arms (e.g., wheelchair or bedside chair)?: A Little Help needed to walk in hospital room?: A Little Help needed climbing 3-5 steps with a railing? : A Lot 6 Click Score: 19    End of Session Equipment Utilized During Treatment: Gait belt Activity Tolerance: Patient tolerated treatment well Patient left: with call bell/phone within reach;with family/visitor present;in bed Nurse Communication: Mobility status PT Visit Diagnosis: Difficulty in walking, not elsewhere classified (R26.2);Pain Pain - Right/Left: Left Pain - part of body: Knee      Time: 1444-5848 PT Time Calculation (min) (ACUTE ONLY): 28 min  Charges:  $Gait Training: 8-22 mins $Therapeutic Exercise: 8-22 mins                     Coolidge Breeze, PT, DPT [facility] Office: [phone] Pager: [phone]   Coolidge Breeze 05/04/2022, 2:53 PM

## 2022-05-04 NOTE — Evaluation (Addendum)
Physical Therapy Evaluation Patient Details Name: Carla May MRN: [id number] DOB: 09/02/1941 Today's Date: 05/04/2022  History of Present Illness  Pt is a 81yo female presenting s/p L-TKA on 05/03/22. PMH: DM, GERD, HLD, PONV, OA, anxiety, L-TKA.  Clinical Impression  Carla May is a 81 y.o. female seen POD1 s/p L-TKA. Patient reports independence with mobility at baseline. Patient is now limited by functional impairments (see PT problem list below) and requires min guard for transfers and gait with RW. Patient was able to ambulate 120 feet with RW and min guard assist. Patient instructed in exercise to facilitate ROM and circulation to manage edema. Patient will benefit from continued skilled PT interventions to address impairments and progress towards PLOF. Acute PT will follow to progress mobility and stair training in preparation for safe discharge home.       Recommendations for follow up therapy are one component of a multi-disciplinary discharge planning process, led by the attending physician.  Recommendations may be updated based on patient status, additional functional criteria and insurance authorization.  Follow Up Recommendations Follow physician's recommendations for discharge plan and follow up therapies    Assistance Recommended at Discharge Intermittent Supervision/Assistance  Patient can return home with the following  A little help with walking and/or transfers;A little help with bathing/dressing/bathroom;Assistance with cooking/housework;Assist for transportation;Help with stairs or ramp for entrance    Equipment Recommendations None recommended by PT (Pt has recommended DME)  Recommendations for Other Services       Functional Status Assessment Patient has had a recent decline in their functional status and demonstrates the ability to make significant improvements in function in a reasonable and predictable amount of time.     Precautions / Restrictions  Precautions Precautions: Fall Restrictions Weight Bearing Restrictions: No Other Position/Activity Restrictions: WBAT      Mobility  Bed Mobility Overal bed mobility: Needs Assistance Bed Mobility: Supine to Sit     Supine to sit: Supervision     General bed mobility comments: For safety only, no physical assist required    Transfers Overall transfer level: Needs assistance Equipment used: Rolling walker (2 wheels) Transfers: Sit to/from Stand Sit to Stand: Min guard           General transfer comment: Min guard for safety only, no pysical assist required, multimodal cues for sequencing and powering up through BUE and RLE.    Ambulation/Gait Ambulation/Gait assistance: Min guard Gait Distance (Feet): 120 Feet Assistive device: Rolling walker (2 wheels) Gait Pattern/deviations: Step-to pattern Gait velocity: decreased     General Gait Details: Pt ambulated 177ft with RW and min guard assist, no physical assist required or overt LOB noted, one short standing rest break.  Stairs            Wheelchair Mobility    Modified Rankin (Stroke Patients Only)       Balance                                             Pertinent Vitals/Pain Pain Assessment Pain Assessment: 0-10 Pain Score: 1  Pain Location: Left knee Pain Descriptors / Indicators: Operative site guarding Pain Intervention(s): Limited activity within patient's tolerance, Monitored during session, Repositioned, Ice applied    Home Living Family/patient expects to be discharged to:: Private residence Living Arrangements: Alone Available Help at Discharge: Family;Available 24 hours/day Type of Home: House Home  Access: Stairs to enter Entrance Stairs-Rails: None Entrance Stairs-Number of Steps: 3   Home Layout: One level Home Equipment: Cane - single Librarian, academic (2 wheels)      Prior Function Prior Level of Function : Independent/Modified Independent              Mobility Comments: Uses SPC seldom, usually ind ADLs Comments: ind     Hand Dominance        Extremity/Trunk Assessment   Upper Extremity Assessment Upper Extremity Assessment: Overall WFL for tasks assessed    Lower Extremity Assessment Lower Extremity Assessment: RLE deficits/detail;LLE deficits/detail RLE Deficits / Details: MMT ank DF/PF 4/5 RLE Sensation: history of peripheral neuropathy (reduced sensation in distal portion of foot all aspects.) LLE Deficits / Details: MMT ank DF/PF 4/5, no extensor lag noted LLE Sensation: WNL    Cervical / Trunk Assessment Cervical / Trunk Assessment: Kyphotic  Communication   Communication: No difficulties  Cognition Arousal/Alertness: Awake/alert Behavior During Therapy: WFL for tasks assessed/performed Overall Cognitive Status: Within Functional Limits for tasks assessed                                          General Comments General comments (skin integrity, edema, etc.): Daughter Dare present for session    Exercises Total Joint Exercises Ankle Circles/Pumps: AROM, Both, 20 reps, Seated Other Exercises Other Exercises: Incentive spirometry x5, VCs for slow and controlled Other Exercises: EDucated pt and daughter on car transfer with demonstrations.   Assessment/Plan    PT Assessment Patient needs continued PT services  PT Problem List Decreased strength;Decreased range of motion;Decreased activity tolerance;Decreased balance;Decreased mobility;Decreased coordination;Decreased knowledge of use of DME;Pain       PT Treatment Interventions Patient/family education;Neuromuscular re-education;Balance training;Therapeutic exercise;Therapeutic activities;Functional mobility training;Stair training;Gait training;DME instruction    PT Goals (Current goals can be found in the Care Plan section)  Acute Rehab PT Goals Patient Stated Goal: To walk without pain PT Goal Formulation: With patient Time For  Goal Achievement: 05/11/22 Potential to Achieve Goals: Good    Frequency 7X/week     Co-evaluation               AM-PAC PT "6 Clicks" Mobility  Outcome Measure Help needed turning from your back to your side while in a flat bed without using bedrails?: None Help needed moving from lying on your back to sitting on the side of a flat bed without using bedrails?: None Help needed moving to and from a bed to a chair (including a wheelchair)?: A Little Help needed standing up from a chair using your arms (e.g., wheelchair or bedside chair)?: A Little Help needed to walk in hospital room?: A Little Help needed climbing 3-5 steps with a railing? : A Lot 6 Click Score: 19    End of Session Equipment Utilized During Treatment: Gait belt Activity Tolerance: Patient tolerated treatment well Patient left: in chair;with call bell/phone within reach;with chair alarm set;with family/visitor present Nurse Communication: Mobility status PT Visit Diagnosis: Difficulty in walking, not elsewhere classified (R26.2);Pain Pain - Right/Left: Left Pain - part of body: Knee    Time: 0937-1002 PT Time Calculation (min) (ACUTE ONLY): 25 min   Charges:   PT Evaluation $PT Eval Low Complexity: 1 Low PT Treatments $Gait Training: 8-22 mins        Jamesetta Geralds, PT, DPT [facility] Office: [phone] Pager: [phone]  Jamesetta Geralds 05/04/2022, 10:50 AM

## 2022-05-04 NOTE — TOC Transition Note (Signed)
Transition of Care [facility]) - CM/SW Discharge Note  Patient Details  Name: Carla May MRN: [id number] Date of Birth: Nov 27, 1940  [facility] CM/SW Contact:  Sherie Don, LCSW Phone Number: 05/04/2022, 9:51 AM  Clinical Narrative: Patient is expected to discharge home after working with PT. CSW met with patient to confirm discharge plan. Patient will go home with OPPT at [facility]. Patient has a rolling walker, cane, and elevated toilets at home so there are no DME needs at this time. TOC signing off.  Final next level of care: OP Rehab Barriers to Discharge: No Barriers Identified  Patient Goals and CMS Choice Patient states their goals for this hospitalization and ongoing recovery are:: Discharge home with OPPT at [facility] Choice offered to / list presented to : NA  Discharge Plan and Services        DME Arranged: N/A DME Agency: NA  Readmission Risk Interventions     No data to display

## 2022-05-04 NOTE — Plan of Care (Signed)
All discharge instructions were given to the Pt. Discussed pain management. All questions were answered. 

## 2022-05-04 NOTE — Progress Notes (Signed)
Subjective: 1 Day Post-Op Procedure(s) (LRB): TOTAL KNEE ARTHROPLASTY (Left) Patient reports pain as mild.   Patient seen in rounds by Dr. Lequita Halt. Patient is well, and has had no acute complaints or problems No issues overnight. Denies chest pain, SOB, or calf pain. Foley catheter removed this AM.  We will begin therapy today.  Objective: Vital signs in last 24 hours: Temp:  [97.5 F (36.4 C)-98.9 F (37.2 C)] 97.9 F (36.6 C) (06/20 0604) Pulse Rate:  [80-106] 82 (06/20 0604) Resp:  [11-18] 17 (06/20 0604) BP: (106-153)/(54-84) 116/54 (06/20 0604) SpO2:  [94 %-100 %] 95 % (06/20 0604) Weight:  [71.7 kg] 71.7 kg (06/19 1034)  Intake/Output from previous day:  Intake/Output Summary (Last 24 hours) at 05/04/2022 0808 Last data filed at 05/04/2022 0600 Gross per 24 hour  Intake 3188.59 ml  Output 3075 ml  Net 113.59 ml     Intake/Output this shift: No intake/output data recorded.  Labs: Recent Labs    05/04/22 0350  HGB 12.3   Recent Labs    05/04/22 0350  WBC 16.3*  RBC 4.12  HCT 37.0  PLT 257   Recent Labs    05/04/22 0350  NA 137  K 4.4  CL 107  CO2 25  BUN 22  CREATININE 0.73  GLUCOSE 143*  CALCIUM 9.1   No results for input(s): "LABPT", "INR" in the last 72 hours.  Exam: General - Patient is Alert and Oriented Extremity - Neurologically intact Neurovascular intact Sensation intact distally Dorsiflexion/Plantar flexion intact Dressing - dressing C/D/I Motor Function - intact, moving foot and toes well on exam.   Past Medical History:  Diagnosis Date   Anxiety    Arthritis    back. neck   Diabetes mellitus without complication (HCC)    Dysrhythmia    GERD (gastroesophageal reflux disease)    History of palpitations    HOH (hard of hearing) 06/28/2019   Hyperlipidemia    diet controlled and fish oil   MVP (mitral valve prolapse)    never has caused any problems per patient on 06/27/19   PONV (postoperative nausea and vomiting)     No problems in 08/2018; however has had previous problems   Pre-diabetes    diet controlled, no med   Retinal detachment    OU   SVD (spontaneous vaginal delivery)    x 2    Assessment/Plan: 1 Day Post-Op Procedure(s) (LRB): TOTAL KNEE ARTHROPLASTY (Left) Principal Problem:   OA (osteoarthritis) of knee Active Problems:   Osteoarthritis of left knee  Estimated body mass index is 26.29 kg/m as calculated from the following:   Height as of this encounter: 5\' 5"  (1.651 m).   Weight as of this encounter: 71.7 kg. Advance diet Up with therapy D/C IV fluids   Patient's anticipated LOS is less than 2 midnights, meeting these requirements: - Lives within 1 hour of care - Has a competent adult at home to recover with post-op recover - NO history of  - Chronic pain requiring opioids  - Coronary Artery Disease  - Heart failure  - Heart attack  - Stroke  - DVT/VTE  - Cardiac arrhythmia  - Respiratory Failure/COPD  - Renal failure  - Anemia  - Advanced Liver disease  DVT Prophylaxis - Aspirin Weight bearing as tolerated. Continue therapy.  Plan is to go Home after hospital stay. Plan for discharge later today if progresses with therapy and meeting goals. Scheduled for OPPT at [facility]. Follow-up in the office in  2 weeks.  The PDMP database was reviewed today prior to any opioid medications being prescribed to this patient.  Arther Abbott, PA-C Orthopedic Surgery [phone] 05/04/2022, 8:08 AM

## 2022-05-05 NOTE — Discharge Summary (Signed)
Patient ID: Carla May MRN: [id number] DOB/AGE: 1941/10/23 81 y.o.  Admit date: 05/03/2022 Discharge date: 05/04/2022  Admission Diagnoses:  Principal Problem:   OA (osteoarthritis) of knee Active Problems:   Osteoarthritis of left knee   Discharge Diagnoses:  Same  Past Medical History:  Diagnosis Date   Anxiety    Arthritis    back. 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 female who was admitted 05/03/2022 for operative treatment ofOA (osteoarthritis) of knee. Patient has severe unremitting pain that affects sleep, daily activities, and work/hobbies. After pre-op clearance the patient was taken to the operating room on 05/03/2022 and underwent  Procedure(s): TOTAL KNEE ARTHROPLASTY.    Patient was given perioperative antibiotics:  Anti-infectives (From admission, onward)    Start     Dose/Rate Route Frequency Ordered Stop   05/03/22 1800  ceFAZolin (ANCEF) IVPB 2g/100 mL premix        2 g 200 mL/hr over 30 Minutes Intravenous  Once 05/03/22 1559 05/03/22 1818   05/03/22 1015  ceFAZolin (ANCEF) IVPB 2g/100 mL premix        2 g 200 mL/hr over 30 Minutes Intravenous On call to O.R. 05/03/22 1009 05/03/22 1222   05/03/22 1009  vancomycin (VANCOCIN) IVPB 1000 mg/200 mL premix        1,000 mg 200 mL/hr over 60 Minutes  Intravenous 60 min pre-op 05/03/22 1009 05/03/22 1230        Patient was given sequential compression devices, early ambulation, and chemoprophylaxis to prevent DVT.  Patient benefited maximally from hospital stay and there were no complications.    Recent vital signs: Patient Vitals for the past 24 hrs:  BP Temp Temp src Pulse Resp SpO2  05/04/22 1313 (!) 118/54 97.7 F (36.5 C) Oral 74 17 97 %  05/04/22 1153 (!) 119/58 97.8 F (36.6 C) Oral 80 -- 100 %  05/04/22 1011 (!) 110/53 97.8 F (36.6 C) -- 74 17 96 %     Recent laboratory studies:  Recent Labs    05/04/22 0350  WBC 16.3*  HGB 12.3  HCT 37.0  PLT 257  NA 137  K 4.4  CL 107  CO2 25  BUN 22  CREATININE 0.73  GLUCOSE 143*  CALCIUM 9.1     Discharge Medications:   Allergies as of 05/04/2022       Reactions   Atorvastatin Other (See Comments)   Muscle aches/ memory loss   Penicillins Itching, Nausea Only   Did it involve swelling of the face/tongue/throat, SOB, or low BP? Unknown Did it involve sudden or severe rash/hives, skin peeling, or any reaction on the inside of your mouth or nose? Itching Did you need to seek medical attention at a hospital or  doctor's office? No When did it last happen? 1970s If all above answers are "NO", may proceed with cephalosporin use. Tolerated Cephalosporin Date: 05/04/22.   Sulfa Antibiotics Nausea Only        Medication List     STOP taking these medications    aspirin 81 MG chewable tablet Replaced by: aspirin EC 325 MG tablet   azithromycin 250 MG tablet Commonly known as: Zithromax Z-Pak   bacitracin-polymyxin b ophthalmic ointment Commonly known as: POLYSPORIN   dorzolamide-timolol 22.3-6.8 MG/ML ophthalmic solution Commonly known as: COSOPT   Durezol 0.05 % Emul Generic drug: Difluprednate   nabumetone 500 MG tablet Commonly known as: RELAFEN   predniSONE 20 MG tablet Commonly known as: Deltasone       TAKE these medications     acetaminophen 650 MG CR tablet Commonly known as: TYLENOL Take 1,300 mg by mouth every 8 (eight) hours as needed for pain.   aspirin EC 325 MG tablet Take 1 tablet (325 mg total) by mouth 2 (two) times daily for 20 days. Then resume one 81 mg aspirin once a day. Replaces: aspirin 81 MG chewable tablet   b complex vitamins capsule Take 1 capsule by mouth daily.   Calcium-Magnesium-Zinc Tabs Take 1 tablet by mouth daily.   carboxymethylcellulose 0.5 % Soln Commonly known as: REFRESH PLUS Place 1 drop into both eyes 4 (four) times daily as needed (for dry eyes).   icosapent Ethyl 1 g capsule Commonly known as: VASCEPA Take 2 g by mouth 2 (two) times daily.   multivitamin with minerals Tabs tablet Take 1 tablet by mouth daily. One-A-Day for Women 50+   omeprazole 20 MG tablet Commonly known as: PRILOSEC OTC Take 20 mg by mouth daily.   oxyCODONE 5 MG immediate release tablet Commonly known as: Oxy IR/ROXICODONE Take 1-2 tablets (5-10 mg total) by mouth every 6 (six) hours as needed for severe pain.   prednisoLONE acetate 1 % ophthalmic suspension Commonly known as: PRED FORTE INSTILL 1 DROP INTO LEFT EYE 4 TIMES A DAY   Prolensa 0.07 % Soln Generic drug: Bromfenac Sodium Place 1 drop into the left eye 4 (four) times daily. What changed: when to take this   tiZANidine 4 MG tablet Commonly known as: ZANAFLEX Take 1 tablet (4 mg total) by mouth 3 (three) times daily as needed for muscle spasms.   traMADol 50 MG tablet Commonly known as: ULTRAM Take 1-2 tablets (50-100 mg total) by mouth every 6 (six) hours as needed for moderate pain.   vitamin C 500 MG tablet Commonly known as: ASCORBIC ACID Take 500 mg by mouth daily.               Discharge Care Instructions  (From admission, onward)           Start     Ordered   05/04/22 0000  Weight bearing as tolerated        05/04/22 0813   05/04/22 0000  Change dressing       Comments: You may remove the  bulky bandage (ACE wrap and gauze) two days after surgery. You will have an adhesive waterproof bandage underneath. Leave this in place until your first follow-up appointment.   05/04/22 0813            Diagnostic Studies: OCT, Retina - OU - Both Eyes  Result Date: 04/23/2022 Right Eye Quality was good. Central Foveal Thickness: 297. Progression has been stable. Findings include normal foveal contour, no IRF, no SRF,  epiretinal membrane (Stable ERM). Left Eye Quality was good. Central Foveal Thickness: 276. Progression has been stable. Findings include normal foveal contour, no SRF, epiretinal membrane, intraretinal fluid (Persistent perifoveal cystic changes--trace improvement; Persistent ERM). Notes *Images captured and stored on drive Diagnosis / Impression: OD: mild ERM; NFP; no IRF/SRF-stable from prior OS: Persistent perifoveal cystic changes--trace improvement; Persistent ERM Clinical management: See below Abbreviations: NFP - Normal foveal profile. CME - cystoid macular edema. PED - pigment epithelial detachment. IRF - intraretinal fluid. SRF - subretinal fluid. EZ - ellipsoid zone. ERM - epiretinal membrane. ORA - outer retinal atrophy. ORT - outer retinal tubulation. SRHM - subretinal hyper-reflective material    Disposition: Discharge disposition: 01-Home or Self Care       Discharge Instructions     Call MD / Call 911   Complete by: As directed    If you experience chest pain or shortness of breath, CALL 911 and be transported to the hospital emergency room.  If you develope a fever above 101 F, pus (white drainage) or increased drainage or redness at the wound, or calf pain, call your surgeon's office.   Change dressing   Complete by: As directed    You may remove the bulky bandage (ACE wrap and gauze) two days after surgery. You will have an adhesive waterproof bandage underneath. Leave this in place until your first follow-up appointment.   Constipation Prevention   Complete  by: As directed    Drink plenty of fluids.  Prune juice may be helpful.  You may use a stool softener, such as Colace (over the counter) 100 mg twice a day.  Use MiraLax (over the counter) for constipation as needed.   Diet - low sodium heart healthy   Complete by: As directed    Do not put a pillow under the knee. Place it under the heel.   Complete by: As directed    Driving restrictions   Complete by: As directed    No driving for two weeks   Post-operative opioid taper instructions:   Complete by: As directed    POST-OPERATIVE OPIOID TAPER INSTRUCTIONS: It is important to wean off of your opioid medication as soon as possible. If you do not need pain medication after your surgery it is ok to stop day one. Opioids include: Codeine, Hydrocodone(Norco, Vicodin), Oxycodone(Percocet, oxycontin) and hydromorphone amongst others.  Long term and even short term use of opiods can cause: Increased pain response Dependence Constipation Depression Respiratory depression And more.  Withdrawal symptoms can include Flu like symptoms Nausea, vomiting And more Techniques to manage these symptoms Hydrate well Eat regular healthy meals Stay active Use relaxation techniques(deep breathing, meditating, yoga) Do Not substitute Alcohol to help with tapering If you have been on opioids for less than two weeks and do not have pain than it is ok to stop all together.  Plan to wean off of opioids This plan should start within one week post op of your joint replacement. Maintain the same interval or time between taking each dose and first decrease the dose.  Cut the total daily intake of opioids by one tablet each day Next start to increase the time between doses. The last dose that should be eliminated is the evening dose.      TED hose   Complete by: As directed    Use stockings (TED hose) for three weeks on both leg(s).  You may remove them at night for sleeping.   Weight bearing as tolerated  Complete by: As directed         Follow-up Information     Aluisio, Homero Fellers, MD. Schedule an appointment as soon as possible for a visit in 2 week(s).   Specialty: Orthopedic Surgery Contact information: [address] [place] [address] [phone]                  Signed: Arther Abbott 05/05/2022, 8:19 AM

## 2022-05-17 NOTE — Progress Notes (Signed)
[facility] Note  05/25/2022     CHIEF COMPLAINT Patient presents for Retina Follow Up  HISTORY OF PRESENT ILLNESS: Carla May is a 81 y.o. female who presents to the clinic today for:   HPI     Retina Follow Up   Patient presents with  Other.  In left eye.  Duration of 5 weeks.  Since onset it is stable.  I, the attending physician,  performed the HPI with the patient and updated documentation appropriately.        Comments   5 week follow up CME OS-  TV isn't as clear but reading seems a little better. Patient had Left knee sx 3 weeks ago.      Last edited by Bernarda Caffey, MD on 05/26/2022  9:00 AM.    Pt states she had a knee replacement.   Referring physician: Mckinley Jewel, MD [address],  [place] [address]  HISTORICAL INFORMATION:   Selected notes from the MEDICAL RECORD NUMBER Referred by Dr. Len Blalock for concern of retinal hole.   CURRENT MEDICATIONS: Current Outpatient Medications (Ophthalmic Drugs)  Medication Sig   carboxymethylcellulose (REFRESH PLUS) 0.5 % SOLN Place 1 drop into both eyes 4 (four) times daily as needed (for dry eyes).   prednisoLONE acetate (PRED FORTE) 1 % ophthalmic suspension INSTILL 1 DROP INTO LEFT EYE 4 TIMES A DAY   Bromfenac Sodium (PROLENSA) 0.07 % SOLN Place 1 drop into the left eye 4 (four) times daily. (Patient taking differently: Place 1 drop into the left eye in the morning and at bedtime.)   No current facility-administered medications for this visit. (Ophthalmic Drugs)   Current Outpatient Medications (Other)  Medication Sig   acetaminophen (TYLENOL) 650 MG CR tablet Take 1,300 mg by mouth every 8 (eight) hours as needed for pain.   b complex vitamins capsule Take 1 capsule by mouth daily.   icosapent Ethyl (VASCEPA) 1 g capsule Take 2 g by mouth 2 (two) times daily.   Multiple Minerals (CALCIUM-MAGNESIUM-ZINC) TABS Take 1 tablet by mouth daily.    Multiple Vitamin (MULTIVITAMIN WITH MINERALS) TABS tablet Take 1 tablet by mouth daily. One-A-Day for Women 50+   omeprazole (PRILOSEC OTC) 20 MG tablet Take 20 mg by mouth daily.   tiZANidine (ZANAFLEX) 4 MG tablet Take 1 tablet (4 mg total) by mouth 3 (three) times daily as needed for muscle spasms.   vitamin C (ASCORBIC ACID) 500 MG tablet Take 500 mg by mouth daily.   oxyCODONE (OXY IR/ROXICODONE) 5 MG immediate release tablet Take 1-2 tablets (5-10 mg total) by mouth every 6 (six) hours as needed for severe pain. (Patient not taking: Reported on 05/25/2022)   traMADol (ULTRAM) 50 MG tablet Take 1-2 tablets (50-100 mg total) by mouth every 6 (six) hours as needed for moderate pain. (Patient not taking: Reported on 05/25/2022)   Current Facility-Administered Medications (Other)  Medication Route   triamcinolone acetonide (KENALOG-40) injection 4 mg Intravitreal   REVIEW OF SYSTEMS: ROS   Positive for: Gastrointestinal, Neurological, Musculoskeletal, HENT, Eyes Negative for: Constitutional, Skin, Genitourinary, Endocrine, Cardiovascular, Respiratory, Psychiatric, Allergic/Imm, Heme/Lymph Last edited by Leonie Douglas, COA on 05/25/2022  1:35 PM.     ALLERGIES Allergies  Allergen Reactions   Atorvastatin Other (See Comments)    Muscle aches/ memory loss   Penicillins Itching and Nausea Only    Did it involve swelling of the face/tongue/throat, SOB, or low BP? Unknown Did  it involve sudden or severe rash/hives, skin peeling, or any reaction on the inside of your mouth or nose? Itching Did you need to seek medical attention at a hospital or doctor's office? No When did it last happen? 1970s If all above answers are "NO", may proceed with cephalosporin use.  Tolerated Cephalosporin Date: 05/04/22.     Sulfa Antibiotics Nausea Only   PAST MEDICAL HISTORY Past Medical History:  Diagnosis Date   Anxiety    Arthritis    back. neck   Diabetes mellitus without complication (HCC)     Dysrhythmia    GERD (gastroesophageal reflux disease)    History of palpitations    HOH (hard of hearing) 06/28/2019   Hyperlipidemia    diet controlled and fish oil   MVP (mitral valve prolapse)    never has caused any problems per patient on 06/27/19   PONV (postoperative nausea and vomiting)    No problems in 08/2018; however has had previous problems   Pre-diabetes    diet controlled, no med   Retinal detachment    OU   SVD (spontaneous vaginal delivery)    x 2   Past Surgical History:  Procedure Laterality Date   ABDOMINAL HYSTERECTOMY  1970's   AIR/FLUID EXCHANGE Left 06/28/2019   Procedure: Air/Fluid Exchange;  Surgeon: Bernarda Caffey, MD;  Location: [place];  Service: Ophthalmology;  Laterality: Left;   CATARACT EXTRACTION Bilateral    Dr. Talbert Forest   COLONOSCOPY     polyp   EYE SURGERY Bilateral    Cat Sx and RD repair   GAS INSERTION Right 11/26/2014   Procedure: INSERTION OF GAS;  Surgeon: Hayden Pedro, MD;  Location: [place];  Service: Ophthalmology;  Laterality: Right;  C3F8   GAS/FLUID EXCHANGE Left 08/21/2018   Procedure: GAS/FLUID EXCHANGE;  Surgeon: Bernarda Caffey, MD;  Location: [place];  Service: Ophthalmology;  Laterality: Left;  C3F8   KNEE ARTHROSCOPY Left    LASER PHOTO ABLATION Left 06/28/2019   Procedure: Laser Photo Ablation;  Surgeon: Bernarda Caffey, MD;  Location: [place];  Service: Ophthalmology;  Laterality: Left;   PARS PLANA VITRECTOMY Left 06/28/2019   Procedure: PARS PLANA VITRECTOMY WITH 25 GAUGE;  Surgeon: Bernarda Caffey, MD;  Location: [place];  Service: Ophthalmology;  Laterality: Left;   PHOTOCOAGULATION WITH LASER Right 11/26/2014   Procedure: PHOTOCOAGULATION WITH LASER;  Surgeon: Hayden Pedro, MD;  Location: [place];  Service: Ophthalmology;  Laterality: Right;   PHOTOCOAGULATION WITH LASER Left 08/21/2018   Procedure: PHOTOCOAGULATION WITH LASER;  Surgeon: Bernarda Caffey, MD;  Location: [place];  Service: Ophthalmology;  Laterality: Left;   REPAIR OF COMPLEX  TRACTION RETINAL DETACHMENT Left 12/06/2018   Procedure: REPAIR OF COMPLEX TRACTION RETINAL DETACHMENT, 25 gauge vitrectomy, endolaser photocoagulation, memebrane peel, perfluoron injection,  and silicone oil;  Surgeon: Bernarda Caffey, MD;  Location: [place];  Service: Ophthalmology;  Laterality: Left;   RETINAL DETACHMENT SURGERY Bilateral    SBP OD - Dr. Tempie Hoist (1.12.16).  SBP - Dr. Bernarda Caffey (10.7.19)   SCLERAL BUCKLE Right 11/26/2014   Procedure: SCLERAL BUCKLE RIGHT EYE ;  Surgeon: Hayden Pedro, MD;  Location: [place];  Service: Ophthalmology;  Laterality: Right;   SCLERAL BUCKLE WITH POSSIBLE 25 GAUGE PARS PLANA VITRECTOMY Left 08/21/2018   Procedure: SCLERAL BUCKLE WITH 25 GAUGE PARS PLANA VITRECTOMY;  Surgeon: Bernarda Caffey, MD;  Location: [place];  Service: Ophthalmology;  Laterality: Left;   SILICON OIL REMOVAL Left 02/03/253   Procedure: Silicon  Oil Removal;  Surgeon: Bernarda Caffey, MD;  Location: [place];  Service: Ophthalmology;  Laterality: Left;   TONSILLECTOMY     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   Procedure: TOTAL KNEE ARTHROPLASTY;  Surgeon: Gaynelle Arabian, MD;  Location: [facility];  Service: Orthopedics;  Laterality: Left;   FAMILY HISTORY Family History  Problem Relation Age of Onset   Cancer Mother    Heart failure Father    SOCIAL HISTORY Social History   Tobacco Use   Smoking status: Former    Packs/day: 0.50    Years: 10.00    Total pack years: 5.00    Types: Cigarettes    Quit date: 1970    Years since quitting: 53.5   Smokeless tobacco: Never   Tobacco comments:    quit in 1970's  Vaping Use   Vaping Use: Never used  Substance Use Topics   Alcohol use: No   Drug use: No       OPHTHALMIC EXAM:  Base Eye Exam     Visual Acuity (Snellen - Linear)       Right Left   Dist cc 20/50 -1 20/60   Dist ph cc 20/40 +1 20/40 -2  Forgot glasses, put Rx in phoropter.         Tonometry (Tonopen, 1:45 PM)       Right Left   Pressure 12 13          Pupils       Dark Light Shape React APD   Right 3 2 Round Minimal None   Left 3 2 Round Minimal None         Visual Fields (Counting fingers)       Left Right    Full Full         Extraocular Movement       Right Left    Full Full         Neuro/Psych     Oriented x3: Yes   Mood/Affect: Normal         Dilation     Both eyes: 1.0% Mydriacyl, 2.5% Phenylephrine @ 1:45 PM           Slit Lamp and Fundus Exam     Slit Lamp Exam       Right Left   Lids/Lashes Dermatochalasis - upper lid, Meibomian gland dysfunction Dermatochalasis - upper lid, Meibomian gland dysfunction   Conjunctiva/Sclera normal Stable improvement of focal nodule ST quad   Cornea Trace inferior Punctate epithelial erosions, Temporal Well healed cataract wounds, Debris in tear film, mild arcus Trace Punctate epithelial erosions, well healed cataract wound, mild tear film debris   Anterior Chamber Deep and quiet Deep, 1+ fine cell pigment   Iris Round and dilated Round and moderately dilated   Lens PC IOL in good position with open PC PC IOL in good position with open PC   Anterior Vitreous Vitreous syneresis, mild pigment in anterior vitreous, Posterior vitreous detachment; scattered vitreous debris Post vitrectomy, 1+pigment         Fundus Exam       Right Left   Disc Pink and Sharp, Compact Sharp rim, mild tilt, trace temporal pallor, temporal Peripapillary atrophy, Compact   C/D Ratio 0.2 0.3   Macula Flat, Good foveal reflex, Retinal pigment epithelial mottling, ERM, No heme or edema Flat, good foveal reflex, +epiretinal membrane, mild Retinal pigment epithelial mottling, Cystic changes improved   Vessels attenuated, mild tortuousity attenuated, mild tortuosity   Periphery Attached  over scleral buckle, good buckle height, good scarring over buckle Retina attached over buckle; good buckle height; good laser surrounding buckle w/ new row posterior to buckle; +fibrosis at 0300 over  buckle; retinotomy from 0300-0430 with good laser surrounding           Refraction     Wearing Rx       Sphere Cylinder Axis   Right -2.25 +0.75 092   Left -2.75 +1.50 137           IMAGING AND PROCEDURES  Imaging and Procedures for '@TODAY' @  OCT, Retina - OU - Both Eyes       Right Eye Quality was good. Central Foveal Thickness: 281. Progression has been stable. Findings include normal foveal contour, no IRF, no SRF, epiretinal membrane (Mild ERM).   Left Eye Quality was good. Central Foveal Thickness: 273. Progression has improved. Findings include normal foveal contour, no SRF, epiretinal membrane, intraretinal fluid (Interval improvement perifoveal cystic changes--trace cystic changes remain, Persistent ERM).   Notes *Images captured and stored on drive  Diagnosis / Impression:  OD: mild ERM; NFP; no IRF/SRF-stable from prior OS: Interval improvement perifoveal cystic changes--trace cystic changes remain, Persistent ERM  Clinical management:  See below  Abbreviations: NFP - Normal foveal profile. CME - cystoid macular edema. PED - pigment epithelial detachment. IRF - intraretinal fluid. SRF - subretinal fluid. EZ - ellipsoid zone. ERM - epiretinal membrane. ORA - outer retinal atrophy. ORT - outer retinal tubulation. SRHM - subretinal hyper-reflective material             ASSESSMENT/PLAN:    ICD-10-CM   1. CME (cystoid macular edema), left  H35.352 OCT, Retina - OU - Both Eyes    2. Left retinal detachment  H33.22     3. 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 History of retinal detachment  Z86.69     6. Pseudophakia of both eyes  Z96.1     7. Dry eyes  H04.123      1. CME OS   - history of recurrent post-op CME - s/p sub-tenons kenalog (03.13.20)  - interval increase / redevelopment of cystic changes OS on 12.09.21 and more recently on 12.20.22 -- restarted Prolensa QID OS (was still on  PF QID OS)  - pt follows with Dr. Helane Rima, who she saw in April, he tapered PF to BID OS and Prolensa to once a day.  - Prolensa increased back to BID per Dr. Coralyn Pear (05.09.23),  - PF and Prolensa increased to 3-4/day (06.09.23) - OCT today Interval improvement perifoveal cystic changes--trace cystic changes remain, Persistent ERM  - BCVA OS improved to 20/40 from 20/60  - recommend decreasing  PF and Prolensa to 2x/day OS   - discussed possible need for low dose maintenance therapy to keep CME away  - f/u 8 weeks -- DFE/OCT  2,3. Retinal detachment, OS             - originally: macula-sparing inferior retinal detachment from 3-6 oclock             - large HST at 0500 and 2 small tears at 300 within the detached retina             - s/p SBP + PPV/PFC/EL/FAX/14% C3F8 OS, 10.07.19 - progressive fibrosis/PVR just posterior to buckle around 0400 with +SRF tracking posterior to buckle -- focal tractional/PVR detachment OS             -  s/p UMP/NT/IRW/[id number] silicon oil OS, 6.71.2458  - s/p 09X PPV with silicon oil removal (83.38.25)  - retina attached and in good position -- good buckle height and laser over buckle and around retinotomy             - IOP okay at 16 today  - BCVA 20/40 as discussed above   4. Nodular scleritis OS -- recurrent, but currently quiescent  - history of recurrent episodes following the tapering of meds (PO pred and topical PF/Prolensa)  - today (4.4.23) -- interval redevelopment of focal nodular scleritis in ST quad             - limited lab work to rule out rheumatologic cause--all results negative   CBC, CMP   HIV   HLA Panel   ANA   ANCA   ACE, Lysozyme   RF   ESR, CRP  - today, inflammatory nodule stably improved  5. History of retinal detachment OD  - S/P SBP OD 11/26/14 -- Dr. Tempie Hoist  - looks great -- retina in great position  - monitor  6. Pseudophakia OU  - s/p CE/IOL OU ([place])  - doing well  - monitor  7. Dry eyes OU -  recommend lubricating ointment at night along with artificial tears     Ophthalmic Meds Ordered this visit:  No orders of the defined types were placed in this encounter.    Return in about 8 weeks (around 07/20/2022) for DFE, OCT.  There are no Patient Instructions on file for this visit.  Explained the diagnoses, plan, and follow up with the patient and they expressed understanding.  Patient expressed understanding of the importance of proper follow up care.   This document serves as a record of services personally performed by Gardiner Sleeper, MD, PhD. It was created on their behalf by Renaldo Reel, [place] an ophthalmic technician. The creation of this record is the provider's dictation and/or activities during the visit.    Electronically signed by:  Renaldo Reel, COT  05/17/22 10:40 PM    Gardiner Sleeper, M.D., Ph.D. Diseases & Surgery of the Retina and Vitreous [facility]  I have reviewed the above documentation for accuracy and completeness, and I agree with the above. Gardiner Sleeper, M.D., Ph.D. 05/27/22 10:41 PM  Abbreviations: M myopia (nearsighted); A astigmatism; H hyperopia (farsighted); P presbyopia; Mrx spectacle prescription;  CTL contact lenses; OD right eye; OS left eye; OU both eyes  XT exotropia; ET esotropia; PEK punctate epithelial keratitis; PEE punctate epithelial erosions; DES dry eye syndrome; MGD meibomian gland dysfunction; ATs artificial tears; PFAT's preservative free artificial tears; [place] nuclear sclerotic cataract; PSC posterior subcapsular cataract; ERM epi-retinal membrane; PVD posterior vitreous detachment; RD retinal detachment; DM diabetes mellitus; DR diabetic retinopathy; NPDR non-proliferative diabetic retinopathy; PDR proliferative diabetic retinopathy; CSME clinically significant macular edema; DME diabetic macular edema; dbh dot blot hemorrhages; CWS cotton wool spot; POAG primary open angle glaucoma; C/D cup-to-disc  ratio; HVF humphrey visual field; GVF goldmann visual field; OCT optical coherence tomography; IOP intraocular pressure; BRVO Branch retinal vein occlusion; CRVO central retinal vein occlusion; CRAO central retinal artery occlusion; BRAO branch retinal artery occlusion; RT retinal tear; SB scleral buckle; PPV pars plana vitrectomy; VH Vitreous hemorrhage; PRP panretinal laser photocoagulation; IVK intravitreal kenalog; VMT vitreomacular traction; MH Macular hole;  NVD neovascularization of the disc; NVE neovascularization elsewhere; AREDS age related eye disease study; ARMD age related macular degeneration; POAG primary open angle glaucoma;  EBMD epithelial/anterior basement membrane dystrophy; ACIOL anterior chamber intraocular lens; IOL intraocular lens; PCIOL posterior chamber intraocular lens; Phaco/IOL phacoemulsification with intraocular lens placement; [place] photorefractive keratectomy; LASIK laser assisted in situ keratomileusis; HTN hypertension; DM diabetes mellitus; COPD chronic obstructive pulmonary disease

## 2022-05-19 ENCOUNTER — Encounter (INDEPENDENT_AMBULATORY_CARE_PROVIDER_SITE_OTHER): Payer: Medicare Other | Admitting: Ophthalmology

## 2022-05-19 DIAGNOSIS — H04123 Dry eye syndrome of bilateral lacrimal glands: Secondary | ICD-10-CM

## 2022-05-19 DIAGNOSIS — H35352 Cystoid macular degeneration, left eye: Secondary | ICD-10-CM

## 2022-05-19 DIAGNOSIS — H3342 Traction detachment of retina, left eye: Secondary | ICD-10-CM

## 2022-05-19 DIAGNOSIS — H3322 Serous retinal detachment, left eye: Secondary | ICD-10-CM

## 2022-05-19 DIAGNOSIS — Z8669 Personal history of other diseases of the nervous system and sense organs: Secondary | ICD-10-CM

## 2022-05-19 DIAGNOSIS — Z961 Presence of intraocular lens: Secondary | ICD-10-CM

## 2022-05-19 DIAGNOSIS — H15092 Other scleritis, left eye: Secondary | ICD-10-CM

## 2022-05-19 DIAGNOSIS — H40052 Ocular hypertension, left eye: Secondary | ICD-10-CM

## 2022-05-25 ENCOUNTER — Encounter (INDEPENDENT_AMBULATORY_CARE_PROVIDER_SITE_OTHER): Payer: Self-pay | Admitting: Ophthalmology

## 2022-05-25 ENCOUNTER — Ambulatory Visit (INDEPENDENT_AMBULATORY_CARE_PROVIDER_SITE_OTHER): Payer: Medicare Other | Admitting: Ophthalmology

## 2022-05-25 DIAGNOSIS — H15092 Other scleritis, left eye: Secondary | ICD-10-CM

## 2022-05-25 DIAGNOSIS — H35352 Cystoid macular degeneration, left eye: Secondary | ICD-10-CM

## 2022-05-25 DIAGNOSIS — H04123 Dry eye syndrome of bilateral lacrimal glands: Secondary | ICD-10-CM

## 2022-05-25 DIAGNOSIS — H3322 Serous retinal detachment, left eye: Secondary | ICD-10-CM

## 2022-05-25 DIAGNOSIS — H3342 Traction detachment of retina, left eye: Secondary | ICD-10-CM | POA: Diagnosis not present

## 2022-05-25 DIAGNOSIS — Z8669 Personal history of other diseases of the nervous system and sense organs: Secondary | ICD-10-CM

## 2022-05-25 DIAGNOSIS — H40052 Ocular hypertension, left eye: Secondary | ICD-10-CM

## 2022-05-25 DIAGNOSIS — Z961 Presence of intraocular lens: Secondary | ICD-10-CM

## 2022-05-26 ENCOUNTER — Encounter (INDEPENDENT_AMBULATORY_CARE_PROVIDER_SITE_OTHER): Payer: Self-pay | Admitting: Ophthalmology

## 2022-05-27 ENCOUNTER — Other Ambulatory Visit (HOSPITAL_COMMUNITY): Payer: Medicare Other

## 2022-07-08 NOTE — Progress Notes (Addendum)
[facility] Note  07/20/2022     CHIEF COMPLAINT Patient presents for Retina Follow Up  HISTORY OF PRESENT ILLNESS: Carla May is a 81 y.o. female who presents to the clinic today for:   HPI     Retina Follow Up   Patient presents with  Other.  In left eye.  Duration of 8 weeks.  Since onset it is stable.  I, the attending physician,  performed the HPI with the patient and updated documentation appropriately.        Comments   Patient feels that the vision (distance & near) is not as good as it was, especially reading.       Last edited by Bernarda Caffey, MD on 07/20/2022 12:18 PM.    Pt saw Dr. Helane Rima who did not change any of her medications, pt states eyes water and seem "cloudy", but no redness or pain, she uses Refresh as needed   Referring physician: Mckinley Jewel, MD [address],  [place] [id number]  HISTORICAL INFORMATION:   Selected notes from the MEDICAL RECORD NUMBER Referred by Dr. Len Blalock for concern of retinal hole.   CURRENT MEDICATIONS: Current Outpatient Medications (Ophthalmic Drugs)  Medication Sig   Bromfenac Sodium (PROLENSA) 0.07 % SOLN Place 1 drop into the left eye in the morning and at bedtime.   carboxymethylcellulose (REFRESH PLUS) 0.5 % SOLN Place 1 drop into both eyes 4 (four) times daily as needed (for dry eyes).   prednisoLONE acetate (PRED FORTE) 1 % ophthalmic suspension INSTILL 1 DROP INTO LEFT EYE 4 TIMES A DAY   No current facility-administered medications for this visit. (Ophthalmic Drugs)   Current Outpatient Medications (Other)  Medication Sig   acetaminophen (TYLENOL) 650 MG CR tablet Take 1,300 mg by mouth every 8 (eight) hours as needed for pain.   b complex vitamins capsule Take 1 capsule by mouth daily.   icosapent Ethyl (VASCEPA) 1 g capsule Take 2 g by mouth 2 (two) times daily.   Multiple Minerals (CALCIUM-MAGNESIUM-ZINC) TABS Take 1 tablet by mouth daily.    Multiple Vitamin (MULTIVITAMIN WITH MINERALS) TABS tablet Take 1 tablet by mouth daily. One-A-Day for Women 50+   omeprazole (PRILOSEC OTC) 20 MG tablet Take 20 mg by mouth daily.   oxyCODONE (OXY IR/ROXICODONE) 5 MG immediate release tablet Take 1-2 tablets (5-10 mg total) by mouth every 6 (six) hours as needed for severe pain. (Patient not taking: Reported on 05/25/2022)   tiZANidine (ZANAFLEX) 4 MG tablet Take 1 tablet (4 mg total) by mouth 3 (three) times daily as needed for muscle spasms.   traMADol (ULTRAM) 50 MG tablet Take 1-2 tablets (50-100 mg total) by mouth every 6 (six) hours as needed for moderate pain. (Patient not taking: Reported on 05/25/2022)   vitamin C (ASCORBIC ACID) 500 MG tablet Take 500 mg by mouth daily.   Current Facility-Administered Medications (Other)  Medication Route   triamcinolone acetonide (KENALOG-40) injection 4 mg Intravitreal   REVIEW OF SYSTEMS: ROS   Positive for: Gastrointestinal, Neurological, Musculoskeletal, HENT, Eyes Negative for: Constitutional, Skin, Genitourinary, Endocrine, Cardiovascular, Respiratory, Psychiatric, Allergic/Imm, Heme/Lymph Last edited by Annie Paras, COT on 07/20/2022  9:46 AM.     ALLERGIES Allergies  Allergen Reactions   Atorvastatin Other (See Comments)    Muscle aches/ memory loss   Penicillins Itching and Nausea Only    Did it involve swelling of the face/tongue/throat, SOB, or low BP? Unknown  Did it involve sudden or severe rash/hives, skin peeling, or any reaction on the inside of your mouth or nose? Itching Did you need to seek medical attention at a hospital or doctor's office? No When did it last happen? 1970s If all above answers are "NO", may proceed with cephalosporin use.  Tolerated Cephalosporin Date: 05/04/22.     Sulfa Antibiotics Nausea Only   PAST MEDICAL HISTORY Past Medical History:  Diagnosis Date   Anxiety    Arthritis    back. neck   Diabetes mellitus without complication (HCC)     Dysrhythmia    GERD (gastroesophageal reflux disease)    History of palpitations    HOH (hard of hearing) 06/28/2019   Hyperlipidemia    diet controlled and fish oil   MVP (mitral valve prolapse)    never has caused any problems per patient on 06/27/19   PONV (postoperative nausea and vomiting)    No problems in 08/2018; however has had previous problems   Pre-diabetes    diet controlled, no med   Retinal detachment    OU   SVD (spontaneous vaginal delivery)    x 2   Past Surgical History:  Procedure Laterality Date   ABDOMINAL HYSTERECTOMY  1970's   AIR/FLUID EXCHANGE Left 06/28/2019   Procedure: Air/Fluid Exchange;  Surgeon: Bernarda Caffey, MD;  Location: [facility];  Service: Ophthalmology;  Laterality: Left;   CATARACT EXTRACTION Bilateral    Dr. Talbert Forest   COLONOSCOPY     polyp   EYE SURGERY Bilateral    Cat Sx and RD repair   GAS INSERTION Right 11/26/2014   Procedure: INSERTION OF GAS;  Surgeon: Hayden Pedro, MD;  Location: [facility];  Service: Ophthalmology;  Laterality: Right;  C3F8   GAS/FLUID EXCHANGE Left 08/21/2018   Procedure: GAS/FLUID EXCHANGE;  Surgeon: Bernarda Caffey, MD;  Location: [facility];  Service: Ophthalmology;  Laterality: Left;  C3F8   KNEE ARTHROSCOPY Left    LASER PHOTO ABLATION Left 06/28/2019   Procedure: Laser Photo Ablation;  Surgeon: Bernarda Caffey, MD;  Location: [facility];  Service: Ophthalmology;  Laterality: Left;   PARS PLANA VITRECTOMY Left 06/28/2019   Procedure: PARS PLANA VITRECTOMY WITH 25 GAUGE;  Surgeon: Bernarda Caffey, MD;  Location: [facility];  Service: Ophthalmology;  Laterality: Left;   PHOTOCOAGULATION WITH LASER Right 11/26/2014   Procedure: PHOTOCOAGULATION WITH LASER;  Surgeon: Hayden Pedro, MD;  Location: [facility];  Service: Ophthalmology;  Laterality: Right;   PHOTOCOAGULATION WITH LASER Left 08/21/2018   Procedure: PHOTOCOAGULATION WITH LASER;  Surgeon: Bernarda Caffey, MD;  Location: [facility];  Service: Ophthalmology;  Laterality: Left;   REPAIR OF  COMPLEX TRACTION RETINAL DETACHMENT Left 12/06/2018   Procedure: REPAIR OF COMPLEX TRACTION RETINAL DETACHMENT, 25 gauge vitrectomy, endolaser photocoagulation, memebrane peel, perfluoron injection,  and silicone oil;  Surgeon: Bernarda Caffey, MD;  Location: [facility];  Service: Ophthalmology;  Laterality: Left;   RETINAL DETACHMENT SURGERY Bilateral    SBP OD - Dr. Tempie Hoist (1.12.16).  SBP - Dr. Bernarda Caffey (10.7.19)   SCLERAL BUCKLE Right 11/26/2014   Procedure: SCLERAL BUCKLE RIGHT EYE ;  Surgeon: Hayden Pedro, MD;  Location: [facility];  Service: Ophthalmology;  Laterality: Right;   SCLERAL BUCKLE WITH POSSIBLE 25 GAUGE PARS PLANA VITRECTOMY Left 08/21/2018   Procedure: SCLERAL BUCKLE WITH 25 GAUGE PARS PLANA VITRECTOMY;  Surgeon: Bernarda Caffey, MD;  Location: [facility];  Service: Ophthalmology;  Laterality: Left;   SILICON OIL REMOVAL Left 06/28/2019   Procedure:  Silicon Oil Removal;  Surgeon: Bernarda Caffey, MD;  Location: [facility];  Service: Ophthalmology;  Laterality: Left;   TONSILLECTOMY     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   Procedure: TOTAL KNEE ARTHROPLASTY;  Surgeon: Gaynelle Arabian, MD;  Location: [facility];  Service: Orthopedics;  Laterality: Left;   FAMILY HISTORY Family History  Problem Relation Age of Onset   Cancer Mother    Heart failure Father    SOCIAL HISTORY Social History   Tobacco Use   Smoking status: Former    Packs/day: 0.50    Years: 10.00    Total pack years: 5.00    Types: Cigarettes    Quit date: 1970    Years since quitting: 53.7   Smokeless tobacco: Never   Tobacco comments:    quit in 1970's  Vaping Use   Vaping Use: Never used  Substance Use Topics   Alcohol use: No   Drug use: No       OPHTHALMIC EXAM:  Base Eye Exam     Visual Acuity (Snellen - Linear)       Right Left   Dist cc 20/50 +1 20/50   Dist ph cc 20/40 +2 20/40 +2    Correction: Glasses         Tonometry (Tonopen, 9:50 AM)       Right Left   Pressure 12 12          Pupils       Dark Light Shape React APD   Right 3 2 Round Minimal None   Left 3 2 Round Minimal None         Visual Fields       Left Right    Full Full         Extraocular Movement       Right Left    Full, Ortho Full, Ortho         Neuro/Psych     Oriented x3: Yes   Mood/Affect: Normal         Dilation     Both eyes: 1.0% Mydriacyl, 2.5% Phenylephrine @ 9:46 AM           Slit Lamp and Fundus Exam     Slit Lamp Exam       Right Left   Lids/Lashes Dermatochalasis - upper lid, Meibomian gland dysfunction Dermatochalasis - upper lid, mild meibomian gland dysfunction   Conjunctiva/Sclera White and quiet Stable improvement of focal nodule ST quad; no injection or elevation   Cornea Trace inferior Punctate epithelial erosions, Temporal Well healed cataract wounds, mild arcus, trace tear film debris, trace [place]utt[place] Trace Punctate epithelial erosions, well healed cataract wound, mild tear film debris   Anterior Chamber deep, 0.5+pigment deep and clear, no cell   Iris Round and dilated Round and moderately dilated   Lens PC IOL in good position with open PC PC IOL in good position with open PC   Anterior Vitreous Vitreous syneresis, mild pigment in anterior vitreous, Posterior vitreous detachment; scattered vitreous debris Post vitrectomy, 1+pigment         Fundus Exam       Right Left   Disc Pink and Sharp, Compact Sharp rim, mild tilt, trace temporal pallor, temporal Peripapillary atrophy, Compact   C/D Ratio 0.2 0.3   Macula Flat, Good foveal reflex, Retinal pigment epithelial mottling, ERM, No heme or edema Flat, blunted foveal reflex, +epiretinal membrane, mild Retinal pigment epithelial mottling and clumping   Vessels attenuated, Tortuous attenuated, Tortuous  Periphery Attached over scleral buckle, good buckle height, good scarring over buckle Retina attached over buckle; good buckle height; good laser surrounding buckle w/ new row posterior to buckle;  +fibrosis at 0300 over buckle; retinotomy from 0300-0430 with good laser surrounding           Refraction     Wearing Rx       Sphere Cylinder Axis   Right -2.25 +0.75 092   Left -2.75 +1.50 137           IMAGING AND PROCEDURES  Imaging and Procedures for _0 @  OCT, Retina - OU - Both Eyes       Right Eye Quality was good. Central Foveal Thickness: 289. Progression has been stable. Findings include normal foveal contour, no IRF, no SRF, epiretinal membrane (Mild ERM -- stable).   Left Eye Quality was good. Central Foveal Thickness: 273. Progression has been stable. Findings include normal foveal contour, no SRF, epiretinal membrane, intraretinal fluid (Stable improvement in perifoveal cystic changes -- just trace cystic changes remain nasal, Persistent ERM).   Notes *Images captured and stored on drive  Diagnosis / Impression:  OD: mild ERM; NFP; no IRF/SRF-stable from prior OS: Stable improvement in perifoveal cystic changes -- just trace cystic changes remain nasal, Persistent ERM  Clinical management:  See below  Abbreviations: NFP - Normal foveal profile. CME - cystoid macular edema. PED - pigment epithelial detachment. IRF - intraretinal fluid. SRF - subretinal fluid. EZ - ellipsoid zone. ERM - epiretinal membrane. ORA - outer retinal atrophy. ORT - outer retinal tubulation. SRHM - subretinal hyper-reflective material            ASSESSMENT/PLAN:    ICD-10-CM   1. CME (cystoid macular edema), left  H35.352 OCT, Retina - OU - Both Eyes    2. 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 History of retinal detachment  Z86.69     5. Pseudophakia of both eyes  Z96.1     6. Dry eyes  H04.123      1. CME OS   - history of recurrent post-op CME - s/p sub-tenons kenalog (03.13.20)  - interval increase / redevelopment of cystic changes OS on 12.09.21 and more recently on 12.20.22 -- restarted Prolensa QID OS  (was still on PF QID OS)  - pt follows with Dr. Helane Rima, who she saw in April, he tapered PF to BID OS and Prolensa to once a day.  - Prolensa increased back to BID per Dr. Coralyn Pear (05.09.23),  - PF and Prolensa increased to 3-4/day (06.09.23), then back to BID on 07.11.23 - OCT today stable improvement perifoveal cystic changes -- just trace cystic changes remain, Persistent ERM  - BCVA OS stable at 20/40  - recommend decreasing PF and Prolensa to 1-2x/day OS   - discussed possible need for low dose maintenance therapy to keep CME away  - f/u 8 weeks -- DFE/OCT  2. Retinal detachment, OS             - originally: macula-sparing inferior retinal detachment from 3-6 oclock             - large HST at 0500 and 2 small tears at 300 within the detached retina             - s/p SBP + PPV/PFC/EL/FAX/14% C3F8 OS, 10.07.19 - progressive fibrosis/PVR just posterior to buckle around 0400 with +SRF tracking posterior  to buckle -- focal tractional/PVR detachment OS             - s/p KGU/RK/YHC/WCBJSEGBTD/VV/6160VP silicon oil OS, [phone]  - s/p 48N PPV with silicon oil removal (46.27.03)  - retina attached and in good position -- good buckle height and laser over buckle and around retinotomy             - IOP okay at 12 today  - BCVA 20/40 as discussed above   3. Nodular scleritis OS -- recurrent, but currently quiescent  - history of recurrent episodes following the tapering of meds (PO pred and topical PF/Prolensa)  - today (4.4.23) -- interval redevelopment of focal nodular scleritis in ST quad             - limited lab work to rule out rheumatologic cause--all results negative   CBC, CMP   HIV   HLA Panel   ANA   ANCA   ACE, Lysozyme   RF   ESR, CRP  - today, inflammatory nodule stably improved  4. History of retinal detachment OD  - S/P SBP OD 11/26/14 -- Dr. Tempie Hoist  - looks great -- retina in great position  - monitor  5. Pseudophakia OU  - s/p CE/IOL OU ([place])  - doing  well  - monitor  6. Dry eyes OU - recommend lubricating ointment at night along with artificial tears     Ophthalmic Meds Ordered this visit:  Meds ordered this encounter  Medications   Bromfenac Sodium (PROLENSA) 0.07 % SOLN    Sig: Place 1 drop into the left eye in the morning and at bedtime.    Dispense:  6 mL    Refill:  3     Return for f/u 3-4 months, CME OS, DFE, OCT.  There are no Patient Instructions on file for this visit.  Explained the diagnoses, plan, and follow up with the patient and they expressed understanding.  Patient expressed understanding of the importance of proper follow up care.   This document serves as a record of services personally performed by Gardiner Sleeper, MD, PhD. It was created on their behalf by Renaldo Reel, [place] an ophthalmic technician. The creation of this record is the provider's dictation and/or activities during the visit.    Electronically signed by:  Renaldo Reel, COT  07/08/22 12:24 PM   This document serves as a record of services personally performed by Gardiner Sleeper, MD, PhD. It was created on their behalf by San Jetty. Owens Shark, OA an ophthalmic technician. The creation of this record is the provider's dictation and/or activities during the visit.    Electronically signed by: San Jetty. Owens Shark, [place] 09.05.2023 12:24 PM  Gardiner Sleeper, M.D., Ph.D. Diseases & Surgery of the Retina and Vitreous [facility]  I have reviewed the above documentation for accuracy and completeness, and I agree with the above. Gardiner Sleeper, M.D., Ph.D. 07/20/22 12:24 PM   Abbreviations: M myopia (nearsighted); A astigmatism; H hyperopia (farsighted); P presbyopia; Mrx spectacle prescription;  CTL contact lenses; OD right eye; OS left eye; OU both eyes  XT exotropia; ET esotropia; PEK punctate epithelial keratitis; PEE punctate epithelial erosions; DES dry eye syndrome; MGD meibomian gland dysfunction; ATs artificial  tears; PFAT's preservative free artificial tears; [place] nuclear sclerotic cataract; PSC posterior subcapsular cataract; ERM epi-retinal membrane; PVD posterior vitreous detachment; RD retinal detachment; DM diabetes mellitus; DR diabetic retinopathy; NPDR non-proliferative diabetic retinopathy; PDR proliferative diabetic retinopathy;  CSME clinically significant macular edema; DME diabetic macular edema; dbh dot blot hemorrhages; CWS cotton wool spot; POAG primary open angle glaucoma; C/D cup-to-disc ratio; HVF humphrey visual field; GVF goldmann visual field; OCT optical coherence tomography; IOP intraocular pressure; BRVO Branch retinal vein occlusion; CRVO central retinal vein occlusion; CRAO central retinal artery occlusion; BRAO branch retinal artery occlusion; RT retinal tear; SB scleral buckle; PPV pars plana vitrectomy; VH Vitreous hemorrhage; PRP panretinal laser photocoagulation; IVK intravitreal kenalog; VMT vitreomacular traction; MH Macular hole;  NVD neovascularization of the disc; NVE neovascularization elsewhere; AREDS age related eye disease study; ARMD age related macular degeneration; POAG primary open angle glaucoma; EBMD epithelial/anterior basement membrane dystrophy; ACIOL anterior chamber intraocular lens; IOL intraocular lens; PCIOL posterior chamber intraocular lens; Phaco/IOL phacoemulsification with intraocular lens placement; [place] photorefractive keratectomy; LASIK laser assisted in situ keratomileusis; HTN hypertension; DM diabetes mellitus; COPD chronic obstructive pulmonary disease

## 2022-07-20 ENCOUNTER — Encounter (INDEPENDENT_AMBULATORY_CARE_PROVIDER_SITE_OTHER): Payer: Self-pay | Admitting: Ophthalmology

## 2022-07-20 ENCOUNTER — Ambulatory Visit (INDEPENDENT_AMBULATORY_CARE_PROVIDER_SITE_OTHER): Payer: Medicare Other | Admitting: Ophthalmology

## 2022-07-20 DIAGNOSIS — H3322 Serous retinal detachment, left eye: Secondary | ICD-10-CM | POA: Diagnosis not present

## 2022-07-20 DIAGNOSIS — Z8669 Personal history of other diseases of the nervous system and sense organs: Secondary | ICD-10-CM | POA: Diagnosis not present

## 2022-07-20 DIAGNOSIS — H35352 Cystoid macular degeneration, left eye: Secondary | ICD-10-CM

## 2022-07-20 DIAGNOSIS — H40052 Ocular hypertension, left eye: Secondary | ICD-10-CM

## 2022-07-20 DIAGNOSIS — Z961 Presence of intraocular lens: Secondary | ICD-10-CM

## 2022-07-20 DIAGNOSIS — H04123 Dry eye syndrome of bilateral lacrimal glands: Secondary | ICD-10-CM

## 2022-07-20 DIAGNOSIS — H15092 Other scleritis, left eye: Secondary | ICD-10-CM

## 2022-07-20 MED ORDER — PROLENSA 0.07 % OP SOLN: 1.0000 [drp] | Freq: Two times a day (BID) | OPHTHALMIC | 3 refills | Status: DC

## 2022-08-20 ENCOUNTER — Encounter: Payer: Self-pay | Admitting: Physical Medicine and Rehabilitation

## 2022-09-06 ENCOUNTER — Encounter: Payer: Self-pay | Admitting: Physical Medicine and Rehabilitation

## 2022-09-06 ENCOUNTER — Encounter
Payer: Medicare Other | Attending: Physical Medicine and Rehabilitation | Admitting: Physical Medicine and Rehabilitation

## 2022-09-06 VITALS — BP 146/76 | HR 75 | Ht 65.0 in | Wt 160.0 lb

## 2022-09-06 DIAGNOSIS — M5441 Lumbago with sciatica, right side: Secondary | ICD-10-CM | POA: Diagnosis present

## 2022-09-06 DIAGNOSIS — G8929 Other chronic pain: Secondary | ICD-10-CM | POA: Insufficient documentation

## 2022-09-06 DIAGNOSIS — M1711 Unilateral primary osteoarthritis, right knee: Secondary | ICD-10-CM | POA: Insufficient documentation

## 2022-09-06 NOTE — Assessment & Plan Note (Signed)
S/p L knee replacement, with good results.   Suspect R knee pain multifactorial with arthritic and radicular pain. May consider ultrasound guided suprapatellar steroid injections of R knee in the future; will concentrate on back for now, as patient continuing to follow with ortho for knees

## 2022-09-06 NOTE — Progress Notes (Signed)
Subjective:    Patient ID: Carla May, female    DOB: [birth year], 81 y.o.   MRN: [id number]  HPI Carla May is a 81 y.o. year old female  who  has a past medical history of Anxiety, Arthritis, Diabetes mellitus without complication ([place]), Dysrhythmia, GERD (gastroesophageal reflux disease), History of palpitations, HOH (hard of hearing) (06/28/2019), Hyperlipidemia, MVP (mitral valve prolapse), PONV (postoperative nausea and vomiting), Pre-diabetes, Retinal detachment, and SVD (spontaneous vaginal delivery).   They are presenting to PM&R clinic as a new patient for pain management evaluation. They were referred by Dr. Ruthann Cancer for treatment of lumbar back pain.   Source: Low back pain R>L, with numbness into her legs.  Inciting incident: None Duration of pain: Numbness in legs started last October; pain in back has been "forever".  Description of pain: Numbness in foot, back is a dull ache Severity: On average 5/10. At worst 9/10. At best 4/10. Exacerbating factors: Lifting her grandkids, bending over, sitting for long periods.  Remitting factors: Moving, standing, knee exercises/squatting.  Red flag symptoms: Patient denies saddle anesthesia, loss of bowel or bladder continence, new weakness, new numbness/tingling, or pain waking up at nighttime.  Medications tried: Topical medications ( some effect) : Voltaren gel, mild benefit on knees. Nsaids ( no effect): On meloxicam once daily; does not do much.  Tylenol  ( excellent effect): Was taking 3x 650 mg tablets/day, was told to switch to meloxicam by her provider. Took her pain down to 2/10.  Opiates  (good effect): After knee surgery was on tramadol and oxycodone; she is constipated at baseline but did have some lethargy.  Gabapentin / Lyrica  ( no effect): Was on it for a little after surgery; also has take in the past for hammer toe. Noted no effect on her pain, so she stopped.  TCAs : Never tried SNRIs : Never tried Other :  none  Other treatments: PT/OT  ( some effect): Never for her back, has noticed some of her knee exercises help.  Accupuncture/chiropractor/massage: Hasn't tried TENs unit : Hasn't tried Injections : Hasn't tried Surgery: Dr. Shela Leff said "absolutely never" surgery for her back.   Goals for pain control: Likes to garden, play with the grandkids.   Pain Inventory Average Pain 5 Pain Right Now 5 My pain is constant, dull, aching, and NUMBNESS  In the last 24 hours, has pain interfered with the following? General activity 4 Relation with others 4 Enjoyment of life 8 What TIME of day is your pain at its worst? evening Sleep (in general) Fair  Pain is worse with: bending and some activites Pain improves with: rest and medication Relief from Meds: 5  walk without assistance ability to climb steps?  yes do you drive?  yes  retired  bowel control problems weakness numbness loss of taste or smell  bone scan x-rays in the [facility]  Any changes since last visit?  no    Family History  Problem Relation Age of Onset   Cancer Mother    Heart failure Father    Social History   Socioeconomic History   Marital status: Divorced    Spouse name: Not on file   Number of children: Not on file   Years of education: Not on file   Highest education level: Not on file  Occupational History   Not on file  Tobacco Use   Smoking status: Former    Packs/day: 0.50    Years: 10.00  Total pack years: 5.00    Types: Cigarettes    Quit date: 1970    Years since quitting: 53.8   Smokeless tobacco: Never   Tobacco comments:    quit in 1970's  Vaping Use   Vaping Use: Never used  Substance and Sexual Activity   Alcohol use: No   Drug use: No   Sexual activity: Not on file    Comment: Hysterectomy  Other Topics Concern   Not on file  Social History Narrative   Not on file   Social Determinants of Health   Financial Resource Strain: Not on file  Food Insecurity: Not  on file  Transportation Needs: Not on file  Physical Activity: Not on file  Stress: Not on file  Social Connections: Not on file   Past Surgical History:  Procedure Laterality Date   ABDOMINAL HYSTERECTOMY  1970's   AIR/FLUID EXCHANGE Left 06/28/2019   Procedure: Air/Fluid Exchange;  Surgeon: Bernarda Caffey, MD;  Location: [place];  Service: Ophthalmology;  Laterality: Left;   CATARACT EXTRACTION Bilateral    Dr. Talbert Forest   COLONOSCOPY     polyp   EYE SURGERY Bilateral    Cat Sx and RD repair   GAS INSERTION Right 11/26/2014   Procedure: INSERTION OF GAS;  Surgeon: Hayden Pedro, MD;  Location: [place];  Service: Ophthalmology;  Laterality: Right;  C3F8   GAS/FLUID EXCHANGE Left 08/21/2018   Procedure: GAS/FLUID EXCHANGE;  Surgeon: Bernarda Caffey, MD;  Location: [place];  Service: Ophthalmology;  Laterality: Left;  C3F8   KNEE ARTHROSCOPY Left    LASER PHOTO ABLATION Left 06/28/2019   Procedure: Laser Photo Ablation;  Surgeon: Bernarda Caffey, MD;  Location: [place];  Service: Ophthalmology;  Laterality: Left;   PARS PLANA VITRECTOMY Left 06/28/2019   Procedure: PARS PLANA VITRECTOMY WITH 25 GAUGE;  Surgeon: Bernarda Caffey, MD;  Location: [place];  Service: Ophthalmology;  Laterality: Left;   PHOTOCOAGULATION WITH LASER Right 11/26/2014   Procedure: PHOTOCOAGULATION WITH LASER;  Surgeon: Hayden Pedro, MD;  Location: [place];  Service: Ophthalmology;  Laterality: Right;   PHOTOCOAGULATION WITH LASER Left 08/21/2018   Procedure: PHOTOCOAGULATION WITH LASER;  Surgeon: Bernarda Caffey, MD;  Location: [place];  Service: Ophthalmology;  Laterality: Left;   REPAIR OF COMPLEX TRACTION RETINAL DETACHMENT Left 12/06/2018   Procedure: REPAIR OF COMPLEX TRACTION RETINAL DETACHMENT, 25 gauge vitrectomy, endolaser photocoagulation, memebrane peel, perfluoron injection,  and silicone oil;  Surgeon: Bernarda Caffey, MD;  Location: [place];  Service: Ophthalmology;  Laterality: Left;   RETINAL DETACHMENT SURGERY Bilateral    SBP  OD - Dr. Tempie Hoist (1.12.16).  SBP - Dr. Bernarda Caffey (10.7.19)   SCLERAL BUCKLE Right 11/26/2014   Procedure: SCLERAL BUCKLE RIGHT EYE ;  Surgeon: Hayden Pedro, MD;  Location: [place];  Service: Ophthalmology;  Laterality: Right;   SCLERAL BUCKLE WITH POSSIBLE 25 GAUGE PARS PLANA VITRECTOMY Left 08/21/2018   Procedure: SCLERAL BUCKLE WITH 25 GAUGE PARS PLANA VITRECTOMY;  Surgeon: Bernarda Caffey, MD;  Location: [place];  Service: Ophthalmology;  Laterality: Left;   SILICON OIL REMOVAL Left [id number]   Procedure: Silicon Oil Removal;  Surgeon: Bernarda Caffey, MD;  Location: [place];  Service: Ophthalmology;  Laterality: Left;   TONSILLECTOMY     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   Procedure: TOTAL KNEE ARTHROPLASTY;  Surgeon: Gaynelle Arabian, MD;  Location: [facility];  Service: Orthopedics;  Laterality: Left;   Past Medical History:  Diagnosis Date   Anxiety  Arthritis    back. neck   Diabetes mellitus without complication ([facility])    Dysrhythmia    GERD (gastroesophageal reflux disease)    History of palpitations    HOH (hard of hearing) 06/28/2019   Hyperlipidemia    diet controlled and fish oil   MVP (mitral valve prolapse)    never has caused any problems per patient on 06/27/19   PONV (postoperative nausea and vomiting)    No problems in 08/2018; however has had previous problems   Pre-diabetes    diet controlled, no med   Retinal detachment    OU   SVD (spontaneous vaginal delivery)    x 2   BP (!) 146/76   Pulse 75   Ht 5\' 5"  (1.651 m)   Wt 160 lb (72.6 kg)   LMP  (LMP Unknown)   SpO2 95%   BMI 26.63 kg/m   Opioid Risk Score:   Fall Risk Score:  `1  Depression screen [facility] 2/9     09/06/2022   10:38 AM 04/08/2020   11:52 AM  Depression screen PHQ 2/9  Decreased Interest 0 0  Down, Depressed, Hopeless 0 0  PHQ - 2 Score 0 0  Altered sleeping 1   Tired, decreased energy 2   Change in appetite 1   Feeling bad or failure about yourself  1   Trouble concentrating  0   Moving slowly or fidgety/restless 0   Suicidal thoughts 0   PHQ-9 Score 5     Review of Systems  Genitourinary:        Frequent void, leakage  Musculoskeletal:  Positive for back pain.  Neurological:  Positive for weakness.       Numbness in feet  All other systems reviewed and are negative.      Objective:   Physical Exam  Constitutional: No apparent distress. Appropriate appearance for age.  HENT: No JVD. Neck Supple. Trachea midline. Atraumatic, normocephalic. Eyes: PERRLA. EOMI. Visual fields grossly intact.  Cardiovascular: RRR, no murmurs/rub/gallops. No Edema. Peripheral pulses 2+  Respiratory: CTAB. No rales, rhonchi, or wheezing. On RA.  Abdomen: + bowel sounds, normoactive. No distention or tenderness.  Skin: C/D/I. No apparent lesions. MSK:      Negative SLR bilaterally. Mild discomfort with facet loading, no radicular pains.       Strength:                RUE: 5/5 SA, 5/5 EF, 5/5 EE, 5/5 WE, 5/5 FF, 5/5 FA                 LUE: 5/5 SA, 5/5 EF, 5/5 EE, 5/5 WE, 5/5 FF, 5/5 FA                 RLE: 5/5 HF, 5/5 KE, 5/5 DF, 5/5 EHL, 5/5 PF                 LLE:  5/5 HF, 5/5 KE, 5/5 DF, 5/5 EHL, 5/5 PF   Neurologic exam:  Cognition: AAO to person, place, time and event.  Mood: Pleasant affect, appropriate mood.  Sensation: To light touch intact in BL UEs and LEs except reduced at R medial malleolus Reflexes: 2+ in BL UE and LEs. Negative babinski signs bilaterally.  Coordination: No apparent tremors.  Spasticity: MAS 0 in all extremities.  Gait: Good stance and stride, no trendelenburg. Mild forward bend with ambulation.        Assessment & Plan:  Carla May is  a 81 y.o. year old female  who  has a past medical history of Anxiety, Arthritis, Diabetes mellitus without complication ([place]), Dysrhythmia, GERD (gastroesophageal reflux disease), History of palpitations, HOH (hard of hearing) (06/28/2019), Hyperlipidemia, MVP (mitral valve prolapse), PONV  (postoperative nausea and vomiting), Pre-diabetes, Retinal detachment, and SVD (spontaneous vaginal delivery presenting to PM&R clinic as a new patient for treatment of lumbar back pain. On exam, multiple possible sources with findings consistent with facet arthropathy, R L4-5 radiculopathy, and R knee OA.    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    Stop Mobic and resume Tylenol for pain control for  650 mg 1 tablet up to 4 times daily. Do not exceed 3000 mg per day.   Referral placed to physical therapy for back stretching, core strengthening, posture and development of a home exercise program.  Follow up in 6-8 weeks to discuss effect. If pain remains poorly controlled at that time, we can discuss epidural steroid injections (Likely R L5) and prescription medication interventions.   Orders: -     Ambulatory referral to Physical Therapy  Osteoarthritis of right knee, unspecified osteoarthritis type Assessment & Plan: S/p L knee replacement, with good results.   Suspect R knee pain multifactorial with arthritic and radicular pain. May consider ultrasound guided suprapatellar steroid injections of R knee in the future; will concentrate on back for now, as patient continuing to follow with ortho for [place], DO 09/06/2022

## 2022-09-06 NOTE — Assessment & Plan Note (Signed)
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    Stop Mobic and resume Tylenol for pain control for  650 mg 1 tablet up to 4 times daily. Do not exceed 3000 mg per day.   Referral placed to physical therapy for back stretching, core strengthening, posture and development of a home exercise program.  Follow up in 6-8 weeks to discuss effect. If pain remains poorly controlled at that time, we can discuss epidural steroid injections (Likely R L5) and prescription medication interventions.

## 2022-09-06 NOTE — Patient Instructions (Addendum)
You may stop Mobic and resume Tylenol for pain control. You may take 650 mg 1 tablet up to 4 times daily. Do not exceed 3000 mg per day.   I have referred you to physical therapy for back stretching, core strengthening, posture and development of a home exercise program.  Follow up in 6-8 weeks to discuss effect. If pain remains poorly controlled at that time, we can discuss epidural steroid injections and prescription medication interventions.

## 2022-10-13 ENCOUNTER — Encounter: Payer: Medicare Other | Admitting: Physical Medicine and Rehabilitation

## 2022-11-03 NOTE — Progress Notes (Signed)
[facility] Note  11/16/2022     CHIEF COMPLAINT Patient presents for Retina Follow Up  HISTORY OF PRESENT ILLNESS: Carla May is a 81 y.o. female who presents to the clinic today for:   HPI     Retina Follow Up   Patient presents with  Other.  In left eye.  Severity is moderate.  Duration of 4 months.  Since onset it is stable.  I, the attending physician,  performed the HPI with the patient and updated documentation appropriately.        Comments   Pt here for 4 mo ret f/u CME OS. Pt states VA is the same but she did have a few episodes where OS went blurry for half an hour or so, hasn't happened again. She also reports an episode in OD where it wouldn't clear up but that has since resolved. Pt was told to go down to Prolensa and PF to QD per Dr. Buelah Manis.        Last edited by Bernarda Caffey, MD on 11/17/2022  4:39 PM.    Pt saw Dr. Helane Rima in November, he told her to reduce PF and Prolensa to qd after 3 months, but pt thought he said 3 weeks, so she has only been using drop once a day since 11.30.23  Referring physician: Mckinley Jewel, MD [address],  [place] [id number]  HISTORICAL INFORMATION:   Selected notes from the MEDICAL RECORD NUMBER Referred by Dr. Len Blalock for concern of retinal hole.   CURRENT MEDICATIONS: Current Outpatient Medications (Ophthalmic Drugs)  Medication Sig   carboxymethylcellulose (REFRESH PLUS) 0.5 % SOLN Place 1 drop into both eyes 4 (four) times daily as needed (for dry eyes).   Bromfenac Sodium (PROLENSA) 0.07 % SOLN Place 1 drop into the left eye 3 (three) times daily.   prednisoLONE acetate (PRED FORTE) 1 % ophthalmic suspension Place 1 drop into the left eye 3 (three) times daily.   No current facility-administered medications for this visit. (Ophthalmic Drugs)   Current Outpatient Medications (Other)  Medication Sig   acetaminophen (TYLENOL) 650 MG CR tablet Take 650 mg by  mouth every 6 (six) hours as needed.   aspirin EC 81 MG tablet Take 81 mg by mouth daily. Swallow whole.   icosapent Ethyl (VASCEPA) 1 g capsule Take 2 g by mouth 2 (two) times daily.   Multiple Minerals (CALCIUM-MAGNESIUM-ZINC) TABS Take 1 tablet by mouth daily.   Multiple Vitamin (MULTIVITAMIN WITH MINERALS) TABS tablet Take 1 tablet by mouth daily. One-A-Day for Women 50+   omeprazole (PRILOSEC OTC) 20 MG tablet Take 20 mg by mouth daily.   b complex vitamins capsule Take 1 capsule by mouth daily. (Patient not taking: Reported on 09/06/2022)   nabumetone (RELAFEN) 500 MG tablet Take 500 mg by mouth 2 (two) times daily. (Patient not taking: Reported on 09/06/2022)   tiZANidine (ZANAFLEX) 4 MG tablet Take 1 tablet (4 mg total) by mouth 3 (three) times daily as needed for muscle spasms. (Patient not taking: Reported on 09/06/2022)   vitamin C (ASCORBIC ACID) 500 MG tablet Take 500 mg by mouth daily. (Patient not taking: Reported on 09/06/2022)   Current Facility-Administered Medications (Other)  Medication Route   triamcinolone acetonide (KENALOG-40) injection 4 mg Intravitreal   REVIEW OF SYSTEMS: ROS   Positive for: Gastrointestinal, Neurological, Musculoskeletal, HENT, Eyes Negative for: Constitutional, Skin, Genitourinary, Endocrine, Cardiovascular, Respiratory, Psychiatric, Allergic/Imm, Heme/Lymph Last edited by  Orvan Falconer E, COT on 11/16/2022 10:13 AM.     ALLERGIES Allergies  Allergen Reactions   Atorvastatin Other (See Comments)    Muscle aches/ memory loss   Penicillins Itching and Nausea Only    Did it involve swelling of the face/tongue/throat, SOB, or low BP? Unknown Did it involve sudden or severe rash/hives, skin peeling, or any reaction on the inside of your mouth or nose? Itching Did you need to seek medical attention at a hospital or doctor's office? No When did it last happen? 1970s If all above answers are "NO", may proceed with cephalosporin use.  Tolerated  Cephalosporin Date: 05/04/22.   Other reaction(s): Not available   Sulfa Antibiotics Nausea Only and Other (See Comments)   PAST MEDICAL HISTORY Past Medical History:  Diagnosis Date   Anxiety    Arthritis    back. neck   Diabetes mellitus without complication (HCC)    Dysrhythmia    GERD (gastroesophageal reflux disease)    History of palpitations    HOH (hard of hearing) 06/28/2019   Hyperlipidemia    diet controlled and fish oil   MVP (mitral valve prolapse)    never has caused any problems per patient on 06/27/19   PONV (postoperative nausea and vomiting)    No problems in 08/2018; however has had previous problems   Pre-diabetes    diet controlled, no med   Retinal detachment    OU   SVD (spontaneous vaginal delivery)    x 2   Past Surgical History:  Procedure Laterality Date   ABDOMINAL HYSTERECTOMY  1970's   AIR/FLUID EXCHANGE Left 06/28/2019   Procedure: Air/Fluid Exchange;  Surgeon: Bernarda Caffey, MD;  Location: [place];  Service: Ophthalmology;  Laterality: Left;   CATARACT EXTRACTION Bilateral    Dr. Talbert Forest   COLONOSCOPY     polyp   EYE SURGERY Bilateral    Cat Sx and RD repair   GAS INSERTION Right 11/26/2014   Procedure: INSERTION OF GAS;  Surgeon: Hayden Pedro, MD;  Location: [employer];  Service: Ophthalmology;  Laterality: Right;  C3F8   GAS/FLUID EXCHANGE Left 08/21/2018   Procedure: GAS/FLUID EXCHANGE;  Surgeon: Bernarda Caffey, MD;  Location: [place];  Service: Ophthalmology;  Laterality: Left;  C3F8   KNEE ARTHROSCOPY Left    LASER PHOTO ABLATION Left 06/28/2019   Procedure: Laser Photo Ablation;  Surgeon: Bernarda Caffey, MD;  Location: [place];  Service: Ophthalmology;  Laterality: Left;   PARS PLANA VITRECTOMY Left 06/28/2019   Procedure: PARS PLANA VITRECTOMY WITH 25 GAUGE;  Surgeon: Bernarda Caffey, MD;  Location: [place];  Service: Ophthalmology;  Laterality: Left;   PHOTOCOAGULATION WITH LASER Right 11/26/2014   Procedure: PHOTOCOAGULATION WITH LASER;  Surgeon:  Hayden Pedro, MD;  Location: [place];  Service: Ophthalmology;  Laterality: Right;   PHOTOCOAGULATION WITH LASER Left 08/21/2018   Procedure: PHOTOCOAGULATION WITH LASER;  Surgeon: Bernarda Caffey, MD;  Location: [place];  Service: Ophthalmology;  Laterality: Left;   REPAIR OF COMPLEX TRACTION RETINAL DETACHMENT Left 12/06/2018   Procedure: REPAIR OF COMPLEX TRACTION RETINAL DETACHMENT, 25 gauge vitrectomy, endolaser photocoagulation, memebrane peel, perfluoron injection,  and silicone oil;  Surgeon: Bernarda Caffey, MD;  Location: [place];  Service: Ophthalmology;  Laterality: Left;   RETINAL DETACHMENT SURGERY Bilateral    SBP OD - Dr. Tempie Hoist (1.12.16).  SBP - Dr. Bernarda Caffey (10.7.19)   SCLERAL BUCKLE Right 11/26/2014   Procedure: SCLERAL BUCKLE RIGHT EYE ;  Surgeon: Hayden Pedro,  MD;  Location: [place];  Service: Ophthalmology;  Laterality: Right;   SCLERAL BUCKLE WITH POSSIBLE 25 GAUGE PARS PLANA VITRECTOMY Left 08/21/2018   Procedure: SCLERAL BUCKLE WITH 25 GAUGE PARS PLANA VITRECTOMY;  Surgeon: Bernarda Caffey, MD;  Location: [place];  Service: Ophthalmology;  Laterality: Left;   SILICON OIL REMOVAL Left 1/49/7026   Procedure: Silicon Oil Removal;  Surgeon: Bernarda Caffey, MD;  Location: [place];  Service: Ophthalmology;  Laterality: Left;   TONSILLECTOMY     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   Procedure: TOTAL KNEE ARTHROPLASTY;  Surgeon: Gaynelle Arabian, MD;  Location: [facility];  Service: Orthopedics;  Laterality: Left;   FAMILY HISTORY Family History  Problem Relation Age of Onset   Cancer Mother    Heart failure Father    SOCIAL HISTORY Social History   Tobacco Use   Smoking status: Former    Packs/day: 0.50    Years: 10.00    Total pack years: 5.00    Types: Cigarettes    Quit date: 1970    Years since quitting: 54.0   Smokeless tobacco: Never   Tobacco comments:    quit in 1970's  Vaping Use   Vaping Use: Never used  Substance Use Topics   Alcohol use: No   Drug use: No        OPHTHALMIC EXAM:  Base Eye Exam     Visual Acuity (Snellen - Linear)       Right Left   Dist cc 20/40 -2 20/70 +1   Dist ph cc 20/30 -2 20/60 -1    Correction: Glasses         Tonometry (Tonopen, 10:22 AM)       Right Left   Pressure 12 12         Pupils       Pupils Dark Light Shape React APD   Right PERRL 3 2 Round Brisk None   Left PERRL 3 2 Round Brisk None         Visual Fields (Counting fingers)       Left Right    Full Full         Extraocular Movement       Right Left    Full, Ortho Full, Ortho         Neuro/Psych     Oriented x3: Yes   Mood/Affect: Normal         Dilation     Both eyes: 1.0% Mydriacyl, 2.5% Phenylephrine @ 10:23 AM           Slit Lamp and Fundus Exam     Slit Lamp Exam       Right Left   Lids/Lashes Dermatochalasis - upper lid, Meibomian gland dysfunction Dermatochalasis - upper lid, mild meibomian gland dysfunction   Conjunctiva/Sclera White and quiet Stable improvement of focal nodule ST quad; no injection or elevation   Cornea Trace inferior Punctate epithelial erosions, Temporal Well healed cataract wounds, mild arcus, trace tear film debris, trace [place] Trace Punctate epithelial erosions, well healed cataract wound, mild tear film debris   Anterior Chamber deep, 0.5+pigment deep and clear, no cell   Iris Round and dilated Round and moderately dilated   Lens PC IOL in good position with open PC PC IOL in good position with open PC   Anterior Vitreous Vitreous syneresis, mild pigment in anterior vitreous, Posterior vitreous detachment; scattered vitreous debris Post vitrectomy, 1+pigment         Fundus Exam  Right Left   Disc Pink and Sharp, Compact Sharp rim, mild tilt, trace temporal pallor, temporal Peripapillary atrophy, Compact   C/D Ratio 0.2 0.3   Macula Flat, Good foveal reflex, Retinal pigment epithelial mottling, ERM, No heme or edema Flat, blunted foveal reflex, +epiretinal  membrane, mild Retinal pigment epithelial mottling and clumping, +cystic changes increased from prior   Vessels attenuated, Tortuous attenuated, Tortuous   Periphery Attached over scleral buckle, good buckle height, good scarring over buckle Retina attached over buckle; good buckle height; good laser surrounding buckle w/ new row posterior to buckle; +fibrosis at 0300 over buckle; retinotomy from 0300-0430 with good laser surrounding           Refraction     Wearing Rx       Sphere Cylinder Axis   Right -2.25 +0.75 092   Left -2.75 +1.50 137         Manifest Refraction       Sphere Cylinder Axis Dist VA   Right       Left -2.75 +1.50 135 20/NI           IMAGING AND PROCEDURES  Imaging and Procedures for _0 @  OCT, Retina - OU - Both Eyes       Right Eye Quality was good. Central Foveal Thickness: 294. Progression has been stable. Findings include normal foveal contour, no IRF, no SRF, epiretinal membrane (Mild ERM -- stable).   Left Eye Quality was good. Central Foveal Thickness: 284. Progression has worsened. Findings include normal foveal contour, no SRF, epiretinal membrane, intraretinal fluid (Mild interval increase in cystic changes IN mac, Persistent ERM).   Notes *Images captured and stored on drive  Diagnosis / Impression:  OD: mild ERM; NFP; no IRF/SRF-stable from prior OS: Mild interval increase in cystic changes IN mac, Persistent ERM  Clinical management:  See below  Abbreviations: NFP - Normal foveal profile. CME - cystoid macular edema. PED - pigment epithelial detachment. IRF - intraretinal fluid. SRF - subretinal fluid. EZ - ellipsoid zone. ERM - epiretinal membrane. ORA - outer retinal atrophy. ORT - outer retinal tubulation. SRHM - subretinal hyper-reflective material            ASSESSMENT/PLAN:    ICD-10-CM   1. CME (cystoid macular edema), left  H35.352 OCT, Retina - OU - Both Eyes    2. Left retinal detachment  H33.22     3.  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 History of retinal detachment  Z86.69     5. Pseudophakia of both eyes  Z96.1     6. Dry eyes  H04.123      1. CME OS   - history of recurrent post-op CME - s/p sub-tenons kenalog (03.13.20)  - interval increase / redevelopment of cystic changes OS on 12.09.21 and more recently on 12.20.22 -- restarted Prolensa QID OS (was still on PF QID OS)  - pt follows with Dr. Helane Rima, who she saw in April 2023, he tapered PF to BID OS and Prolensa to once a day.  - Prolensa increased back to BID per Dr. Coralyn Pear (05.09.23),  - PF and Prolensa increased to 3-4/day (06.09.23), then back to BID on 07.11.23 - pt last saw Grewal on 11.9.23 -- he instructed her to use PF/Prolensa BID for 3 mos then reduce to qdaily until their f/u in April -- however pt has already tapered to qdaily. - OCT today mild interval increase in cystic changes IN mac, Persistent  ERM  - BCVA OS decreased to 20/60 from 20/40  - recommend increase PF and Prolensa to TID for 4 weeks, then BID for 4 wks, then qdaily until f/u w/ Grewal  - discussed possible need for low dose maintenance therapy to keep CME away  - f/u here in 6 months -- DFE/OCT  2. Retinal detachment, OS             - originally: macula-sparing inferior retinal detachment from 3-6 oclock             - large HST at 0500 and 2 small tears at 300 within the detached retina             - s/p SBP + PPV/PFC/EL/FAX/14% C3F8 OS, 10.07.19 - progressive fibrosis/PVR just posterior to buckle around 0400 with +SRF tracking posterior to buckle -- focal tractional/PVR detachment OS             - s/p MEQ/AS/TMH/[id number]/[id number] silicon oil OS, 0.81.4481  - s/p 85U PPV with silicon oil removal (31.49.70)  - retina attached and in good position -- good buckle height and laser over buckle and around retinotomy             - IOP okay at 12 today  - BCVA 20/60 as discussed above   3. Nodular scleritis OS -- recurrent, but currently  quiescent  - history of recurrent episodes following the tapering of meds (PO pred and topical PF/Prolensa)  - today (4.4.23) -- interval redevelopment of focal nodular scleritis in ST quad             - limited lab work to rule out rheumatologic cause--all results negative   CBC, CMP   HIV   HLA Panel   ANA   ANCA   ACE, Lysozyme   RF   ESR, CRP  - today, inflammatory nodule stably improved  4. History of retinal detachment OD  - S/P SBP OD 11/26/14 -- Dr. Tempie Hoist  - looks great -- retina in great position  - monitor  5. Pseudophakia OU  - s/p CE/IOL OU ([place])  - doing well  - monitor  6. Dry eyes OU - recommend lubricating ointment at night along with artificial tears     Ophthalmic Meds Ordered this visit:  Meds ordered this encounter  Medications   Bromfenac Sodium (PROLENSA) 0.07 % SOLN    Sig: Place 1 drop into the left eye 3 (three) times daily.    Dispense:  6 mL    Refill:  3   prednisoLONE acetate (PRED FORTE) 1 % ophthalmic suspension    Sig: Place 1 drop into the left eye 3 (three) times daily.    Dispense:  15 mL    Refill:  0     Return in about 6 months (around 05/17/2023) for f/u CME OS, DFE, OCT.  There are no Patient Instructions on file for this visit.  Explained the diagnoses, plan, and follow up with the patient and they expressed understanding.  Patient expressed understanding of the importance of proper follow up care.   This document serves as a record of services personally performed by Gardiner Sleeper, MD, PhD. It was created on their behalf by Renaldo Reel, [place] an ophthalmic technician. The creation of this record is the provider's dictation and/or activities during the visit.    Electronically signed by:  Renaldo Reel, COT  012.20.23 4:39 PM   This document serves as a record of services personally performed by  Gardiner Sleeper, MD, PhD. It was created on their behalf by San Jetty. Owens Shark, OA an ophthalmic technician. The  creation of this record is the provider's dictation and/or activities during the visit.    Electronically signed by: San Jetty. Marguerita Merles 01.02.2024 4:39 PM  Gardiner Sleeper, M.D., Ph.D. Diseases & Surgery of the Retina and Vitreous [facility]  I have reviewed the above documentation for accuracy and completeness, and I agree with the above. Gardiner Sleeper, M.D., Ph.D. 11/17/22 4:43 PM\  Abbreviations: M myopia (nearsighted); A astigmatism; H hyperopia (farsighted); P presbyopia; Mrx spectacle prescription;  CTL contact lenses; OD right eye; OS left eye; OU both eyes  XT exotropia; ET esotropia; PEK punctate epithelial keratitis; PEE punctate epithelial erosions; DES dry eye syndrome; MGD meibomian gland dysfunction; ATs artificial tears; PFAT's preservative free artificial tears; [place] nuclear sclerotic cataract; PSC posterior subcapsular cataract; ERM epi-retinal membrane; PVD posterior vitreous detachment; RD retinal detachment; DM diabetes mellitus; DR diabetic retinopathy; NPDR non-proliferative diabetic retinopathy; PDR proliferative diabetic retinopathy; CSME clinically significant macular edema; DME diabetic macular edema; dbh dot blot hemorrhages; CWS cotton wool spot; POAG primary open angle glaucoma; C/D cup-to-disc ratio; HVF humphrey visual field; GVF goldmann visual field; OCT optical coherence tomography; IOP intraocular pressure; BRVO Branch retinal vein occlusion; CRVO central retinal vein occlusion; CRAO central retinal artery occlusion; BRAO branch retinal artery occlusion; RT retinal tear; SB scleral buckle; PPV pars plana vitrectomy; VH Vitreous hemorrhage; PRP panretinal laser photocoagulation; IVK intravitreal kenalog; VMT vitreomacular traction; MH Macular hole;  NVD neovascularization of the disc; NVE neovascularization elsewhere; AREDS age related eye disease study; ARMD age related macular degeneration; POAG primary open angle glaucoma; EBMD  epithelial/anterior basement membrane dystrophy; ACIOL anterior chamber intraocular lens; IOL intraocular lens; PCIOL posterior chamber intraocular lens; Phaco/IOL phacoemulsification with intraocular lens placement; [place] photorefractive keratectomy; LASIK laser assisted in situ keratomileusis; HTN hypertension; DM diabetes mellitus; COPD chronic obstructive pulmonary disease

## 2022-11-16 ENCOUNTER — Ambulatory Visit (INDEPENDENT_AMBULATORY_CARE_PROVIDER_SITE_OTHER): Payer: Medicare Other | Admitting: Ophthalmology

## 2022-11-16 ENCOUNTER — Encounter (INDEPENDENT_AMBULATORY_CARE_PROVIDER_SITE_OTHER): Payer: Self-pay | Admitting: Ophthalmology

## 2022-11-16 DIAGNOSIS — H3322 Serous retinal detachment, left eye: Secondary | ICD-10-CM

## 2022-11-16 DIAGNOSIS — Z8669 Personal history of other diseases of the nervous system and sense organs: Secondary | ICD-10-CM

## 2022-11-16 DIAGNOSIS — H35352 Cystoid macular degeneration, left eye: Secondary | ICD-10-CM

## 2022-11-16 DIAGNOSIS — H15092 Other scleritis, left eye: Secondary | ICD-10-CM | POA: Diagnosis not present

## 2022-11-16 DIAGNOSIS — Z961 Presence of intraocular lens: Secondary | ICD-10-CM

## 2022-11-16 DIAGNOSIS — H04123 Dry eye syndrome of bilateral lacrimal glands: Secondary | ICD-10-CM

## 2022-11-16 MED ORDER — PREDNISOLONE ACETATE 1 % OP SUSP: 1.0000 [drp] | Freq: Three times a day (TID) | OPHTHALMIC | 0 refills | Status: DC

## 2022-11-16 MED ORDER — PROLENSA 0.07 % OP SOLN: 1.0000 [drp] | Freq: Three times a day (TID) | OPHTHALMIC | 3 refills | Status: DC

## 2022-11-17 ENCOUNTER — Encounter (INDEPENDENT_AMBULATORY_CARE_PROVIDER_SITE_OTHER): Payer: Self-pay | Admitting: Ophthalmology

## 2022-11-28 NOTE — Progress Notes (Unsigned)
HPI  Carla May is a 82 y.o. year old female  who  has a past medical history of Anxiety, Arthritis, Diabetes mellitus without complication ([place]), Dysrhythmia, GERD (gastroesophageal reflux disease), History of palpitations, HOH (hard of hearing) (06/28/2019), Hyperlipidemia, MVP (mitral valve prolapse), PONV (postoperative nausea and vomiting), Pre-diabetes, Retinal detachment presenting to PM&R clinic for follow up for treatment of lumbar back pain.    Plan from last visit 09/06/22: On exam, multiple possible sources with findings consistent with facet arthropathy, R L4-5 radiculopathy, and R knee OA.   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  Stop Mobic and resume Tylenol for pain control for  650 mg 1 tablet up to 4 times daily. Do not exceed 3000 mg per day.  Referral placed to physical therapy for back stretching, core strengthening, posture and development of a home exercise program. Follow up in 6-8 weeks to discuss effect. If pain remains poorly controlled at that time, we can discuss epidural steroid injections (Likely R L5) and prescription medication interventions.    Osteoarthritis of right knee, unspecified osteoarthritis type Assessment & Plan: S/p L knee replacement, with good results.  Suspect R knee pain multifactorial with arthritic and radicular pain. May consider ultrasound guided suprapatellar steroid injections of R knee in the future; will concentrate on back for now, as patient continuing to follow with ortho for knees   Interval Hx: - Patient states she is doing well with Tylenol 2-3x daily, which keeps her back pain well controlled; no further use of NSAIDs - She has not gone to PT due  to bad weather/holidays ; she did do a HEP based on prior PT sessions for her knees and back without much benefit - Ongoing numbness in her right leg over the shin with a "gritty" sensation in her toes, and tingling/itching in the bottom of R her foot. Symptoms are worse when laying flat; not worsened by prolonged standing or activity.   Pain Inventory Average Pain 8 Pain Right Now 5 My pain is aching  In the last 24 hours, has pain interfered with the following? General activity 4 Relation with others 4 Enjoyment of life 4 What TIME of day is your pain at its worst? morning  Sleep (in general) Good  Pain is worse with: bending and some activites Pain improves with: medication Relief from Meds: 8  Family History  Problem Relation Age of Onset   Cancer Mother    Heart failure Father    Social History   Socioeconomic History   Marital status: Divorced    Spouse name: Not on file   Number of children: Not on file   Years of education: Not on file   Highest education level: Not on file  Occupational History   Not on file  Tobacco Use   Smoking status: Former    Packs/day: 0.50    Years: 10.00    Total pack years: 5.00    Types: Cigarettes    Quit date: 1970    Years since quitting: 54.0   Smokeless tobacco: Never   Tobacco comments:    quit in 1970's  Vaping Use   Vaping Use: Never used  Substance and Sexual Activity   Alcohol use: No   Drug use: No   Sexual  activity: Not on file    Comment: Hysterectomy  Other Topics Concern   Not on file  Social History Narrative   Not on file   Social Determinants of Health   Financial Resource Strain: Not on file  Food Insecurity: Not on file  Transportation Needs: Not on file  Physical Activity: Not on file  Stress: Not on file  Social Connections: Not on file   Past Surgical History:  Procedure Laterality Date   ABDOMINAL HYSTERECTOMY  1970's   AIR/FLUID EXCHANGE Left 06/28/2019   Procedure: Air/Fluid Exchange;   Surgeon: Rennis Chris, MD;  Location: [facility] OR;  Service: Ophthalmology;  Laterality: Left;   CATARACT EXTRACTION Bilateral    Dr. Vonna Kotyk   COLONOSCOPY     polyp   EYE SURGERY Bilateral    Cat Sx and RD repair   GAS INSERTION Right 11/26/2014   Procedure: INSERTION OF GAS;  Surgeon: Sherrie George, MD;  Location: [facility] OR;  Service: Ophthalmology;  Laterality: Right;  C3F8   GAS/FLUID EXCHANGE Left 08/21/2018   Procedure: GAS/FLUID EXCHANGE;  Surgeon: Rennis Chris, MD;  Location: [facility] OR;  Service: Ophthalmology;  Laterality: Left;  C3F8   KNEE ARTHROSCOPY Left    LASER PHOTO ABLATION Left 06/28/2019   Procedure: Laser Photo Ablation;  Surgeon: Rennis Chris, MD;  Location: [facility] OR;  Service: Ophthalmology;  Laterality: Left;   PARS PLANA VITRECTOMY Left 06/28/2019   Procedure: PARS PLANA VITRECTOMY WITH 25 GAUGE;  Surgeon: Rennis Chris, MD;  Location: [facility] OR;  Service: Ophthalmology;  Laterality: Left;   PHOTOCOAGULATION WITH LASER Right 11/26/2014   Procedure: PHOTOCOAGULATION WITH LASER;  Surgeon: Sherrie George, MD;  Location: [facility] OR;  Service: Ophthalmology;  Laterality: Right;   PHOTOCOAGULATION WITH LASER Left 08/21/2018   Procedure: PHOTOCOAGULATION WITH LASER;  Surgeon: Rennis Chris, MD;  Location: [facility] OR;  Service: Ophthalmology;  Laterality: Left;   REPAIR OF COMPLEX TRACTION RETINAL DETACHMENT Left 12/06/2018   Procedure: REPAIR OF COMPLEX TRACTION RETINAL DETACHMENT, 25 gauge vitrectomy, endolaser photocoagulation, memebrane peel, perfluoron injection,  and silicone oil;  Surgeon: Rennis Chris, MD;  Location: [facility] OR;  Service: Ophthalmology;  Laterality: Left;   RETINAL DETACHMENT SURGERY Bilateral    SBP OD - Dr. Alan Mulder (1.12.16).  SBP - Dr. Rennis Chris (10.7.19)   SCLERAL BUCKLE Right 11/26/2014   Procedure: SCLERAL BUCKLE RIGHT EYE ;  Surgeon: Sherrie George, MD;  Location: [facility] OR;  Service: Ophthalmology;  Laterality: Right;   SCLERAL BUCKLE WITH POSSIBLE 25 GAUGE PARS PLANA  VITRECTOMY Left 08/21/2018   Procedure: SCLERAL BUCKLE WITH 25 GAUGE PARS PLANA VITRECTOMY;  Surgeon: Rennis Chris, MD;  Location: [facility] OR;  Service: Ophthalmology;  Laterality: Left;   SILICON OIL REMOVAL Left 06/28/2019   Procedure: Silicon Oil Removal;  Surgeon: Rennis Chris, MD;  Location: [facility] OR;  Service: Ophthalmology;  Laterality: Left;   TONSILLECTOMY     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   Procedure: TOTAL KNEE ARTHROPLASTY;  Surgeon: Ollen Gross, MD;  Location: [facility] ORS;  Service: Orthopedics;  Laterality: Left;   Past Surgical History:  Procedure Laterality Date   ABDOMINAL HYSTERECTOMY  1970's   AIR/FLUID EXCHANGE Left 06/28/2019   Procedure: Air/Fluid Exchange;  Surgeon: Rennis Chris, MD;  Location: [facility] OR;  Service: Ophthalmology;  Laterality: Left;   CATARACT EXTRACTION Bilateral    Dr. Vonna Kotyk   COLONOSCOPY     polyp   EYE SURGERY Bilateral    Cat Sx and RD repair   GAS INSERTION  Right 11/26/2014   Procedure: INSERTION OF GAS;  Surgeon: Sherrie George, MD;  Location: [facility] OR;  Service: Ophthalmology;  Laterality: Right;  C3F8   GAS/FLUID EXCHANGE Left 08/21/2018   Procedure: GAS/FLUID EXCHANGE;  Surgeon: Rennis Chris, MD;  Location: [facility] OR;  Service: Ophthalmology;  Laterality: Left;  C3F8   KNEE ARTHROSCOPY Left    LASER PHOTO ABLATION Left 06/28/2019   Procedure: Laser Photo Ablation;  Surgeon: Rennis Chris, MD;  Location: [facility] OR;  Service: Ophthalmology;  Laterality: Left;   PARS PLANA VITRECTOMY Left 06/28/2019   Procedure: PARS PLANA VITRECTOMY WITH 25 GAUGE;  Surgeon: Rennis Chris, MD;  Location: [facility] OR;  Service: Ophthalmology;  Laterality: Left;   PHOTOCOAGULATION WITH LASER Right 11/26/2014   Procedure: PHOTOCOAGULATION WITH LASER;  Surgeon: Sherrie George, MD;  Location: [facility] OR;  Service: Ophthalmology;  Laterality: Right;   PHOTOCOAGULATION WITH LASER Left 08/21/2018   Procedure: PHOTOCOAGULATION WITH LASER;  Surgeon: Rennis Chris, MD;  Location: [facility] OR;  Service:  Ophthalmology;  Laterality: Left;   REPAIR OF COMPLEX TRACTION RETINAL DETACHMENT Left 12/06/2018   Procedure: REPAIR OF COMPLEX TRACTION RETINAL DETACHMENT, 25 gauge vitrectomy, endolaser photocoagulation, memebrane peel, perfluoron injection,  and silicone oil;  Surgeon: Rennis Chris, MD;  Location: [facility] OR;  Service: Ophthalmology;  Laterality: Left;   RETINAL DETACHMENT SURGERY Bilateral    SBP OD - Dr. Alan Mulder (1.12.16).  SBP - Dr. Rennis Chris (10.7.19)   SCLERAL BUCKLE Right 11/26/2014   Procedure: SCLERAL BUCKLE RIGHT EYE ;  Surgeon: Sherrie George, MD;  Location: [facility] OR;  Service: Ophthalmology;  Laterality: Right;   SCLERAL BUCKLE WITH POSSIBLE 25 GAUGE PARS PLANA VITRECTOMY Left 08/21/2018   Procedure: SCLERAL BUCKLE WITH 25 GAUGE PARS PLANA VITRECTOMY;  Surgeon: Rennis Chris, MD;  Location: [facility] OR;  Service: Ophthalmology;  Laterality: Left;   SILICON OIL REMOVAL Left 06/28/2019   Procedure: Silicon Oil Removal;  Surgeon: Rennis Chris, MD;  Location: [facility] OR;  Service: Ophthalmology;  Laterality: Left;   TONSILLECTOMY     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   Procedure: TOTAL KNEE ARTHROPLASTY;  Surgeon: Ollen Gross, MD;  Location: [facility] ORS;  Service: Orthopedics;  Laterality: Left;   Past Medical History:  Diagnosis Date   Anxiety    Arthritis    back. neck   Diabetes mellitus without complication ([facility])    Dysrhythmia    GERD (gastroesophageal reflux disease)    History of palpitations    HOH (hard of hearing) 06/28/2019   Hyperlipidemia    diet controlled and fish oil   MVP (mitral valve prolapse)    never has caused any problems per patient on 06/27/19   PONV (postoperative nausea and vomiting)    No problems in 08/2018; however has had previous problems   Pre-diabetes    diet controlled, no med   Retinal detachment    OU   SVD (spontaneous vaginal delivery)    x 2   BP (!) 168/73   Pulse 73   Ht 5\' 5"  (1.651 m)   Wt 163 lb 9.6 oz (74.2 kg)   LMP  (LMP  Unknown)   SpO2 97%   BMI 27.22 kg/m   Opioid Risk Score:   Fall Risk Score:  `1  Depression screen [facility] 2/9     09/06/2022   10:38 AM 04/08/2020   11:52 AM  Depression screen PHQ 2/9  Decreased Interest 0 0  Down, Depressed, Hopeless 0 0  PHQ - 2 Score 0  0  Altered sleeping 1   Tired, decreased energy 2   Change in appetite 1   Feeling bad or failure about yourself  1   Trouble concentrating 0   Moving slowly or fidgety/restless 0   Suicidal thoughts 0   PHQ-9 Score 5     ROS Review of Systems  Constitutional:  Negative for chills, fever and weight loss.  Respiratory:  Negative for cough.   Cardiovascular:  Negative for chest pain and palpitations.  Gastrointestinal:  Negative for nausea and vomiting.  Genitourinary:  Negative for dysuria.  Musculoskeletal:  Positive for back pain and joint pain. Negative for falls, myalgias and neck pain.  Neurological:  Positive for tingling and sensory change. Negative for dizziness, tremors, speech change, focal weakness, weakness and headaches.  Psychiatric/Behavioral:  Negative for depression.     PE: Constitution: Appropriate appearance for age. No apparent distress  HEENT: PERRL, EOMI grossly intact.  Resp: CTAB. No rales, rhonchi, or wheezing. Cardio: RRR. No mumurs, rubs, or gallops. Trace peripheral edema at bilateral ankles. Abdomen: Nondistended. Nontender.   Psych: Appropriate mood and affect. MSK:      + discomfort with facet loading, no radicular pains.       Strength:                RUE: 5/5 SA, 5/5 EF, 5/5 EE, 5/5 WE, 5/5 FF, 5/5 FA                 LUE: 5/5 SA, 5/5 EF, 5/5 EE, 5/5 WE, 5/5 FF, 5/5 FA                 RLE: 5/5 HF, 5/5 KE, 5/5 DF, 5/5 EHL, 5/5 PF                 LLE:  5/5 HF, 5/5 KE, 5/5 DF, 5/5 EHL, 5/5 PF    Neurologic exam:  + Tinel's @ R tarsal tunnel, negative on L. Negative SLR bilaterally. Negative slump test.  Sensation: To light touch intact in BL Ues;  Patchy sensaory loss in bilateral  feet not extending past the distal shin with reduced sensation to light touch over R medial malleolus and bilateral plantar feet, R>L.  Cognition: AAO to person, place, time and event.   Reflexes: 2+ in BL UE, 1+ in BL patella, achilles. Negative babinski signs bilaterally.  Coordination: No apparent tremors, HTS WNL.  Spasticity: MAS 0 in all extremities.  Gait: Good stance and stride, no trendelenburg. Mild forward bend with ambulation.   Assessment and plan:  OTILIA KAREEM is a 82 y.o. year old female  who  has a past medical history of Anxiety, Arthritis, Diabetes mellitus without complication ([facility]), Dysrhythmia, GERD (gastroesophageal reflux disease), History of palpitations, HOH (hard of hearing) (06/28/2019), Hyperlipidemia, MVP (mitral valve prolapse), PONV (postoperative nausea and vomiting), Pre-diabetes, Retinal detachment presenting to PM&R clinic for follow up for treatment of lumbar back pain and right leg pain.    Chronic bilateral low back pain with right-sided sciatica Assessment & Plan: MRI 2021: Moderate/advanced disc degeneration and multilevel spondylosis, notable at the L2/3-L5/S1 levels with subarticular narrowing and crowding of the L L3, R L5 nerve roots, with neuroforaminal narrowing at R>L L5-S1.   Referral placed to Dr. Wynn Banker for R L5-S1 ESI, given imaging above and sensory loss/"gritty" and bothersome sensations in R shin and plantar foot  Back pain d/t arthropathy well controlled on Tylenol 650 mg TID PRN; continue current regimen.   Numbness  and tingling of foot Assessment & Plan: Likely multifactorial given findings, L5-S1 radiculopathy vs. Tarsal tunnel vs. Age related idiopathic peripheral neuropathy; all 3 are supported by current exam.   Call clinic 2 weeks after ESI to report effects; if effective, can follow with Dr. Letta Pate for injections and myself PRN. If ineffective, will bring back to clinic for EMG to evaluate for tarsal tunnel.        Gertie Gowda, DO 11/29/2022

## 2022-11-29 ENCOUNTER — Encounter
Payer: Medicare Other | Attending: Physical Medicine and Rehabilitation | Admitting: Physical Medicine and Rehabilitation

## 2022-11-29 VITALS — BP 168/73 | HR 73 | Ht 65.0 in | Wt 163.6 lb

## 2022-11-29 DIAGNOSIS — M5441 Lumbago with sciatica, right side: Secondary | ICD-10-CM

## 2022-11-29 DIAGNOSIS — R202 Paresthesia of skin: Secondary | ICD-10-CM | POA: Insufficient documentation

## 2022-11-29 DIAGNOSIS — G8929 Other chronic pain: Secondary | ICD-10-CM | POA: Diagnosis present

## 2022-11-29 DIAGNOSIS — R200 Anesthesia of skin: Secondary | ICD-10-CM | POA: Diagnosis present

## 2022-11-29 DIAGNOSIS — G609 Hereditary and idiopathic neuropathy, unspecified: Secondary | ICD-10-CM | POA: Insufficient documentation

## 2022-11-29 NOTE — Assessment & Plan Note (Addendum)
Likely multifactorial given findings, L5-S1 radiculopathy vs. Tarsal tunnel vs. Age related idiopathic peripheral neuropathy; all 3 are supported by current exam.   Call clinic 2 weeks after ESI to report effects; if effective, can follow with Dr. Letta Pate for injections and myself PRN. If ineffective, will bring back to clinic for EMG to evaluate for tarsal tunnel.

## 2022-11-29 NOTE — Patient Instructions (Addendum)
I will have you see Dr. Letta Pate for an epidural steroid injection for your right L5-S1 nerve.   Call me 2 weeks after this injection to report effects. If it helps your pain, we can continue to get injections every 6 months or so as needed. If it does not help, we will make an appointment for you to see me for an EMG of your legs to looks for other potential causes (ex. Tarsal tunnel syndrome)  Continue Tylenol for back pain as needed

## 2022-11-29 NOTE — Assessment & Plan Note (Signed)
MRI 2021: Moderate/advanced disc degeneration and multilevel spondylosis, notable at the L2/3-L5/S1 levels with subarticular narrowing and crowding of the L L3, R L5 nerve roots, with neuroforaminal narrowing at R>L L5-S1.   Referral placed to Dr. Letta Pate for R L5-S1 ESI, given imaging above and sensory loss/"gritty" and bothersome sensations in R shin and plantar foot  Back pain d/t arthropathy well controlled on Tylenol 650 mg TID PRN; continue current regimen.

## 2023-01-11 ENCOUNTER — Encounter: Payer: Self-pay | Admitting: Physical Medicine & Rehabilitation

## 2023-01-11 ENCOUNTER — Encounter: Payer: Medicare Other | Attending: Physical Medicine and Rehabilitation | Admitting: Physical Medicine & Rehabilitation

## 2023-01-11 VITALS — BP 131/75 | HR 79 | Ht 65.0 in | Wt 167.0 lb

## 2023-01-11 DIAGNOSIS — M48062 Spinal stenosis, lumbar region with neurogenic claudication: Secondary | ICD-10-CM

## 2023-01-11 DIAGNOSIS — M47816 Spondylosis without myelopathy or radiculopathy, lumbar region: Secondary | ICD-10-CM

## 2023-01-11 NOTE — Progress Notes (Signed)
Subjective:    Patient ID: Carla May, female    DOB: 1941-09-05, 82 y.o.   MRN: [id number]  HPI  82 year old female with history of chronic low back pain for a number of years but over the last 6 months has developed increasing right foot pain.  She states that in addition to the foot she does have some leg pain but mainly below the knee.  She does not feel any pain going down starting in her back into her thigh.  She does have some numbness and tingling in the right greater than the left foot mainly below the ankle.  Standing and bending worsen her pain.  She states her pain is around a 8/10 level.  The patient has tried Tylenol for her pain.  She has GERD on Prilosec therefore NSAIDs have been avoided.  Given the patient's age opiates and muscle relaxers have also been avoided.  We reviewed the patient's MRI correlating with her symptoms.   CLINICAL DATA:  Sciatica, unspecified laterality. Sciatica of left side. Additional history provided by technologist: Patient reports low back pain, worse on the left side, cramps and numbness in left foot, symptoms for 2 months.   EXAM: MRI LUMBAR SPINE WITHOUT CONTRAST   TECHNIQUE: Multiplanar, multisequence MR imaging of the lumbar spine was performed. No intravenous contrast was administered.   COMPARISON:  No pertinent prior studies available for comparison.   FINDINGS: Segmentation: For the purposes of this dictation, five lumbar vertebrae are assumed and the caudal most well-formed intervertebral disc is designated L5-S1.   Alignment: Straightening of the expected lumbar lordosis. No significant spondylolisthesis   Vertebrae: Vertebral body height is maintained. Multilevel degenerative endplate irregularity. Trace degenerative endplate edema at the 624THL, L4-L5 and L5-S1 levels   Conus medullaris and cauda equina: Conus extends to the L1 level. No signal abnormality within the visualized distal spinal cord.   Paraspinal  and other soft tissues: No abnormality identified within included portions of the abdomen/retroperitoneum. Atrophy of the lumbar paraspinal musculature.   Disc levels:   Moderate/advanced disc degeneration at the L2-L3 through L5-S1 levels. Mild disc degeneration at L1-L2.   T12-L1: No disc herniation. No significant canal or foraminal stenosis.   L1-L2: Mild facet arthrosis. No disc herniation. No significant canal or foraminal stenosis.   L2-L3: Disc bulge with circumferential osteophyte ridge. Disc osteophyte ridge is more focally prominent within the left subarticular zone. Mild facet arthrosis/ligamentum flavum hypertrophy. Moderate left subarticular narrowing with crowding of the descending left L3 nerve root (series 5, image 15). Central canal patent. No significant foraminal stenosis.   L3-L4: Disc bulge with endplate spurring. Moderate facet arthrosis/ligamentum flavum hypertrophy (greater on the left). Mild left subarticular narrowing without nerve root impingement. Central canal patent. Mild bilateral neural foraminal narrowing.   L4-L5: Disc bulge with endplate spurring. Superimposed shallow right center/subarticular disc protrusion. Mild facet arthrosis/ligamentum flavum hypertrophy. Moderate right subarticular narrowing with contact upon the descending right L5 nerve root. Central canal patent. Mild right neural foraminal narrowing.   L5-S1: Disc bulge with endplate spurring. Superimposed broad-based right foraminal disc protrusion. Moderate facet arthrosis/ligamentum flavum hypertrophy. Mild bilateral subarticular narrowing (greater on the left) without frank nerve root impingement. Central canal patent. Bilateral neural foraminal narrowing (moderate right, mild left).   IMPRESSION: Lumbar spondylosis as outlined and most notably as follows.   Moderate/advanced disc degeneration at the L2-L3 through L5-S1 levels.   At L2-L3, disc osteophyte ridge  contributes to multifactorial moderate left subarticular narrowing with crowding of  the descending left L3 nerve root.   At L4-L5, a shallow right center/subarticular disc protrusion contributes to moderate right subarticular narrowing with contact upon the descending right L5 nerve root. Mild multifactorial right neural foraminal narrowing also present at this level.   At L5-S1, a broad-based disc protrusion contributes to multifactorial moderate right neural foraminal narrowing. Mild bilateral subarticular and left neural foraminal narrowing also present at this level.   Additional sites of mild subarticular and neural foraminal narrowing as described.     Electronically Signed   By: Kellie Simmering DO   On: 02/27/2020 08:36 Pain Inventory Average Pain 8 Pain Right Now 8 My pain is tingling and aching  In the last 24 hours, has pain interfered with the following? General activity 8 Relation with others 8 Enjoyment of life 8 What TIME of day is your pain at its worst? morning  Sleep (in general) Fair  Pain is worse with: bending and standing Pain improves with: medication Relief from Meds: 5  Family History  Problem Relation Age of Onset  . Cancer Mother   . Heart failure Father    Social History   Socioeconomic History  . Marital status: Divorced    Spouse name: Not on file  . Number of children: Not on file  . Years of education: Not on file  . Highest education level: Not on file  Occupational History  . Not on file  Tobacco Use  . Smoking status: Former    Packs/day: 0.50    Years: 10.00    Total pack years: 5.00    Types: Cigarettes    Quit date: 1970    Years since quitting: 54.1  . Smokeless tobacco: Never  . Tobacco comments:    quit in 1970's  Vaping Use  . Vaping Use: Never used  Substance and Sexual Activity  . Alcohol use: No  . Drug use: No  . Sexual activity: Not on file    Comment: Hysterectomy  Other Topics Concern  . Not on file   Social History Narrative  . Not on file   Social Determinants of Health   Financial Resource Strain: Not on file  Food Insecurity: Not on file  Transportation Needs: Not on file  Physical Activity: Not on file  Stress: Not on file  Social Connections: Not on file   Past Surgical History:  Procedure Laterality Date  . ABDOMINAL HYSTERECTOMY  1970's  . AIR/FLUID EXCHANGE Left 06/28/2019   Procedure: Air/Fluid Exchange;  Surgeon: Bernarda Caffey, MD;  Location: [place];  Service: Ophthalmology;  Laterality: Left;  . CATARACT EXTRACTION Bilateral    Dr. Talbert Forest  . COLONOSCOPY     polyp  . EYE SURGERY Bilateral    Cat Sx and RD repair  . GAS INSERTION Right 11/26/2014   Procedure: INSERTION OF GAS;  Surgeon: Hayden Pedro, MD;  Location: [place];  Service: Ophthalmology;  Laterality: Right;  C3F8  . GAS/FLUID EXCHANGE Left 08/21/2018   Procedure: GAS/FLUID EXCHANGE;  Surgeon: Bernarda Caffey, MD;  Location: [place];  Service: Ophthalmology;  Laterality: Left;  C3F8  . KNEE ARTHROSCOPY Left   . LASER PHOTO ABLATION Left 06/28/2019   Procedure: Laser Photo Ablation;  Surgeon: Bernarda Caffey, MD;  Location: [place];  Service: Ophthalmology;  Laterality: Left;  . PARS PLANA VITRECTOMY Left 06/28/2019   Procedure: PARS PLANA VITRECTOMY WITH 25 GAUGE;  Surgeon: Bernarda Caffey, MD;  Location: [place];  Service: Ophthalmology;  Laterality: Left;  . PHOTOCOAGULATION WITH  LASER Right 11/26/2014   Procedure: PHOTOCOAGULATION WITH LASER;  Surgeon: Hayden Pedro, MD;  Location: [place];  Service: Ophthalmology;  Laterality: Right;  . PHOTOCOAGULATION WITH LASER Left 08/21/2018   Procedure: PHOTOCOAGULATION WITH LASER;  Surgeon: Bernarda Caffey, MD;  Location: [place];  Service: Ophthalmology;  Laterality: Left;  . 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 RETINAL DETACHMENT SURGERY Bilateral    SBP OD - Dr. Tempie Hoist (1.12.16).  SBP - Dr. Bernarda Caffey (10.7.19)  . SCLERAL BUCKLE Right 11/26/2014   Procedure: SCLERAL BUCKLE RIGHT EYE ;  Surgeon: Hayden Pedro, MD;  Location: [place];  Service: Ophthalmology;  Laterality: Right;  . SCLERAL BUCKLE WITH POSSIBLE 25 GAUGE PARS PLANA VITRECTOMY Left 08/21/2018   Procedure: SCLERAL BUCKLE WITH 25 GAUGE PARS PLANA VITRECTOMY;  Surgeon: Bernarda Caffey, MD;  Location: [place];  Service: Ophthalmology;  Laterality: Left;  . SILICON OIL REMOVAL Left [id number]   Procedure: Silicon Oil Removal;  Surgeon: Bernarda Caffey, MD;  Location: [place];  Service: Ophthalmology;  Laterality: Left;  . TONSILLECTOMY    . 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   Procedure: TOTAL KNEE ARTHROPLASTY;  Surgeon: Gaynelle Arabian, MD;  Location: [facility];  Service: Orthopedics;  Laterality: Left;   Past Surgical History:  Procedure Laterality Date  . ABDOMINAL HYSTERECTOMY  1970's  . AIR/FLUID EXCHANGE Left 06/28/2019   Procedure: Air/Fluid Exchange;  Surgeon: Bernarda Caffey, MD;  Location: [place];  Service: Ophthalmology;  Laterality: Left;  . CATARACT EXTRACTION Bilateral    Dr. Talbert Forest  . COLONOSCOPY     polyp  . EYE SURGERY Bilateral    Cat Sx and RD repair  . GAS INSERTION Right 11/26/2014   Procedure: INSERTION OF GAS;  Surgeon: Hayden Pedro, MD;  Location: [place];  Service: Ophthalmology;  Laterality: Right;  C3F8  . GAS/FLUID EXCHANGE Left 08/21/2018   Procedure: GAS/FLUID EXCHANGE;  Surgeon: Bernarda Caffey, MD;  Location: [place];  Service: Ophthalmology;  Laterality: Left;  C3F8  . KNEE ARTHROSCOPY Left   . LASER PHOTO ABLATION Left 06/28/2019   Procedure: Laser Photo Ablation;  Surgeon: Bernarda Caffey, MD;  Location: [place];  Service: Ophthalmology;  Laterality: Left;  . PARS PLANA VITRECTOMY Left 06/28/2019    Procedure: PARS PLANA VITRECTOMY WITH 25 GAUGE;  Surgeon: Bernarda Caffey, MD;  Location: [place];  Service: Ophthalmology;  Laterality: Left;  . PHOTOCOAGULATION WITH LASER Right 11/26/2014   Procedure: PHOTOCOAGULATION WITH LASER;  Surgeon: Hayden Pedro, MD;  Location: [place];  Service: Ophthalmology;  Laterality: Right;  . PHOTOCOAGULATION WITH LASER Left 08/21/2018   Procedure: PHOTOCOAGULATION WITH LASER;  Surgeon: Bernarda Caffey, MD;  Location: [place];  Service: Ophthalmology;  Laterality: Left;  . 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 RETINAL DETACHMENT SURGERY Bilateral    SBP OD - Dr. Tempie Hoist (1.12.16).  SBP - Dr. Bernarda Caffey (10.7.19)  . SCLERAL BUCKLE Right 11/26/2014   Procedure: SCLERAL BUCKLE RIGHT EYE ;  Surgeon: Hayden Pedro, MD;  Location: [facility];  Service: Ophthalmology;  Laterality: Right;  . SCLERAL BUCKLE WITH POSSIBLE 25 GAUGE PARS PLANA VITRECTOMY Left 08/21/2018   Procedure: SCLERAL BUCKLE WITH 25 GAUGE PARS PLANA VITRECTOMY;  Surgeon: Bernarda Caffey, MD;  Location: [place];  Service: Ophthalmology;  Laterality: Left;  . SILICON OIL REMOVAL Left [id number]   Procedure: Silicon Oil Removal;  Surgeon: Bernarda Caffey, MD;  Location: [place];  Service: Ophthalmology;  Laterality: Left;  . TONSILLECTOMY    . 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   Procedure: TOTAL KNEE ARTHROPLASTY;  Surgeon: Gaynelle Arabian, MD;  Location: [facility];  Service: Orthopedics;  Laterality: Left;   Past Medical History:  Diagnosis Date  . Anxiety   . Arthritis    back. neck  . Diabetes mellitus without complication ([place])   . Dysrhythmia   . GERD (gastroesophageal reflux disease)   . History of palpitations   . HOH (hard  of hearing) 06/28/2019  . Hyperlipidemia    diet controlled and fish oil  . MVP (mitral valve prolapse)    never has caused any problems per patient on 06/27/19  . PONV (postoperative nausea and vomiting)    No problems in 08/2018; however has had previous problems  . Pre-diabetes    diet controlled, no med  . Retinal detachment    OU  . SVD (spontaneous vaginal delivery)    x 2   LMP  (LMP Unknown)   Opioid Risk Score:   Fall Risk Score:  `1  Depression screen [facility] 2/9     09/06/2022   10:38 AM 04/08/2020   11:52 AM  Depression screen PHQ 2/9  Decreased Interest 0 0  Down, Depressed, Hopeless 0 0  PHQ - 2 Score 0 0  Altered sleeping 1   Tired, decreased energy 2   Change in appetite 1   Feeling bad or failure about yourself  1   Trouble concentrating 0   Moving slowly or fidgety/restless 0   Suicidal thoughts 0   PHQ-9 Score 5       Review of Systems  Musculoskeletal:  Positive for back pain.       Rt foot  All other systems reviewed and are negative.      Objective:   Physical Exam Elderly female no acute distress Mood and affect are appropriate Motor strength is 5/5 bilateral hip flexor knee extensor ankle dorsiflexors as well as great toe extensor. Negative straight leg raise Ambulates without assistive device no evidence of toe drag or knee instability Sensation mildly diminished to pinprick toes versus left side.  The ankle. 2+ bilateral knees and 0/5 bilateral ankle.       Assessment & Plan:   1.  Lumbar spinal stenosis with right L5 radicular pain.  Her symptoms correlate to the most recent MRI of the lumbar spine dated 02/26/2020.  She has tried some physical therapy for her knee but not specifically for her back.  Will order physical therapy and have her come back in 1 month.  If no better would recommend right L4-5 transforaminal lumbar epidural steroid injection under fluoroscopic guidance.

## 2023-01-26 ENCOUNTER — Ambulatory Visit: Payer: Medicare Other | Attending: Physical Medicine & Rehabilitation

## 2023-01-26 DIAGNOSIS — R262 Difficulty in walking, not elsewhere classified: Secondary | ICD-10-CM | POA: Diagnosis present

## 2023-01-26 DIAGNOSIS — M6281 Muscle weakness (generalized): Secondary | ICD-10-CM | POA: Diagnosis present

## 2023-01-26 DIAGNOSIS — M5459 Other low back pain: Secondary | ICD-10-CM | POA: Diagnosis present

## 2023-01-26 DIAGNOSIS — R2681 Unsteadiness on feet: Secondary | ICD-10-CM

## 2023-01-26 NOTE — Therapy (Signed)
OUTPATIENT PHYSICAL THERAPY NEURO EVALUATION   Patient Name: Carla May MRN: [id number] DOB:July 13, 1941, 82 y.o., female Today's Date: 01/26/2023   PCP: Mckinley Jewel, MD  REFERRING PROVIDER:  Charlett Blake, MD     END OF SESSION:  PT End of Session - 01/26/23 2133     Visit Number 1    Number of Visits 25    Date for PT Re-Evaluation 04/20/23    PT Start Time 1302    PT Stop Time 1345    PT Time Calculation (min) 43 min    Equipment Utilized During Treatment Gait belt    Activity Tolerance Patient tolerated treatment well;Patient limited by pain    Behavior During Therapy WFL for tasks assessed/performed             Past Medical History:  Diagnosis Date   Anxiety    Arthritis    back. neck   Diabetes mellitus without complication (HCC)    Dysrhythmia    GERD (gastroesophageal reflux disease)    History of palpitations    HOH (hard of hearing) 06/28/2019   Hyperlipidemia    diet controlled and fish oil   MVP (mitral valve prolapse)    never has caused any problems per patient on 06/27/19   PONV (postoperative nausea and vomiting)    No problems in 08/2018; however has had previous problems   Pre-diabetes    diet controlled, no med   Retinal detachment    OU   SVD (spontaneous vaginal delivery)    x 2   Past Surgical History:  Procedure Laterality Date   ABDOMINAL HYSTERECTOMY  1970's   AIR/FLUID EXCHANGE Left 06/28/2019   Procedure: Air/Fluid Exchange;  Surgeon: Bernarda Caffey, MD;  Location: [facility];  Service: Ophthalmology;  Laterality: Left;   CATARACT EXTRACTION Bilateral    Dr. Talbert Forest   COLONOSCOPY     polyp   EYE SURGERY Bilateral    Cat Sx and RD repair   GAS INSERTION Right 11/26/2014   Procedure: INSERTION OF GAS;  Surgeon: Hayden Pedro, MD;  Location: [facility];  Service: Ophthalmology;  Laterality: Right;  C3F8   GAS/FLUID EXCHANGE Left 08/21/2018   Procedure: GAS/FLUID EXCHANGE;  Surgeon: Bernarda Caffey, MD;  Location: [facility];   Service: Ophthalmology;  Laterality: Left;  C3F8   KNEE ARTHROSCOPY Left    LASER PHOTO ABLATION Left 06/28/2019   Procedure: Laser Photo Ablation;  Surgeon: Bernarda Caffey, MD;  Location: [facility];  Service: Ophthalmology;  Laterality: Left;   PARS PLANA VITRECTOMY Left 06/28/2019   Procedure: PARS PLANA VITRECTOMY WITH 25 GAUGE;  Surgeon: Bernarda Caffey, MD;  Location: [facility];  Service: Ophthalmology;  Laterality: Left;   PHOTOCOAGULATION WITH LASER Right 11/26/2014   Procedure: PHOTOCOAGULATION WITH LASER;  Surgeon: Hayden Pedro, MD;  Location: [facility];  Service: Ophthalmology;  Laterality: Right;   PHOTOCOAGULATION WITH LASER Left 08/21/2018   Procedure: PHOTOCOAGULATION WITH LASER;  Surgeon: Bernarda Caffey, MD;  Location: [facility];  Service: Ophthalmology;  Laterality: Left;   REPAIR OF COMPLEX TRACTION RETINAL DETACHMENT Left 12/06/2018   Procedure: REPAIR OF COMPLEX TRACTION RETINAL DETACHMENT, 25 gauge vitrectomy, endolaser photocoagulation, memebrane peel, perfluoron injection,  and silicone oil;  Surgeon: Bernarda Caffey, MD;  Location: [facility];  Service: Ophthalmology;  Laterality: Left;   RETINAL DETACHMENT SURGERY Bilateral    SBP OD - Dr. Tempie Hoist (1.12.16).  SBP - Dr. Bernarda Caffey (10.7.19)   SCLERAL BUCKLE Right 11/26/2014   Procedure: SCLERAL BUCKLE  RIGHT EYE ;  Surgeon: Hayden Pedro, MD;  Location: [place];  Service: Ophthalmology;  Laterality: Right;   SCLERAL BUCKLE WITH POSSIBLE 25 GAUGE PARS PLANA VITRECTOMY Left 08/21/2018   Procedure: SCLERAL BUCKLE WITH 25 GAUGE PARS PLANA VITRECTOMY;  Surgeon: Bernarda Caffey, MD;  Location: [facility];  Service: Ophthalmology;  Laterality: Left;   SILICON OIL REMOVAL Left [id number]   Procedure: Silicon Oil Removal;  Surgeon: Bernarda Caffey, MD;  Location: [place];  Service: Ophthalmology;  Laterality: Left;   TONSILLECTOMY     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   Procedure: TOTAL KNEE ARTHROPLASTY;  Surgeon: Gaynelle Arabian, MD;  Location: [facility];   Service: Orthopedics;  Laterality: Left;   Patient Active Problem List   Diagnosis Date Noted   Hereditary and idiopathic peripheral neuropathy 11/29/2022   Numbness and tingling of foot 11/29/2022   Chronic bilateral low back pain with right-sided sciatica 09/06/2022   OA (osteoarthritis) of knee 05/03/2022   Osteoarthritis of left knee 05/03/2022   HOH (hard of hearing) 06/28/2019   Rhegmatogenous retinal detachment of right eye 11/26/2014    ONSET DATE: 2021, years ago  REFERRING DIAG: M48.062 (ICD-10-CM) - Spinal stenosis of lumbar region with neurogenic claudication   THERAPY DIAG:  Other low back pain  Muscle weakness (generalized)  Difficulty in walking, not elsewhere classified  Unsteadiness on feet  Rationale for Evaluation and Treatment: Rehabilitation  SUBJECTIVE:                                                                                                                                                                                             SUBJECTIVE STATEMENT: Pt reports she has had back pain for years. Her pain is more prominent in her RLE than LLE. She feels pain into her toes,describes it as a numbness, tightness. Pt trying PT first before pursuing injection for her back pain. She reports back pain is 10+/10 and foot pain is very uncomfortable. Pt takes Tylenol to manage pain. Currently LBP is impacting her ability to walk, which is limited to 5 minutes. She has difficulty lifting and performing transfers due to pain. She reports poor control of RLE which makes it difficult to get dressed. Pt reports decreased balance. She stumbles when she stands up and has difficulty walking on uneven surfaces. She also feels out of breath easily.   Pt accompanied by: self  PERTINENT HISTORY:  The pt is a pleasant 82 yo female referred to PT for lumbar spinal stenosis with neurogenic claudication. Pt describes chronic low back pain with numbness of LLE. Pt reports she  has had back pain for  years. Her pain is more prominent in her RLE than LLE. She feels pain into her toes,describes it as a numbness, tightness. Pt trying PT first before pursuing injection for her back. She reports back pain is 10+/10. Pt takes Tylenol to manage pain. Currently LBP is impacting her ability to walk, which is limited to 5 minutes. She has difficulty lifting and performing transfers.. She reports poor control of RLE which makes it difficult to get dressed. Pt reports decreased balance. She stumbles when she stands up and has difficulty walking on uneven surfaces. She also feels out of breath easily. 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 Yes: NPRS scale: 10+/10 Pain location: LBP, BLE pain (L worse than R) Pain description: tightness, numbness Aggravating factors: movement Relieving factors: Tylenol  PRECAUTIONS: Fall  WEIGHT BEARING RESTRICTIONS: No  FALLS: Has patient fallen in last 6 months? No  LIVING ENVIRONMENT: Lives with: lives with their spouseSingle point cane, Walker - 2 wheeled, and shower chair Lives in: House/apartment Stairs: Yes: External: 2-3 steps; none Has following equipment at home:   PLOF: Independent  PATIENT GOALS: Pt would like to improve RLE flexibility and control, reducing pain, improving walking  OBJECTIVE:   DIAGNOSTIC FINDINGS:   MR lumbar spine 02/27/20 via chart Narrative & Impression  CLINICAL DATA:  Sciatica, unspecified laterality. Sciatica of left side. Additional history provided by technologist: Patient reports low back pain, worse on the left side, cramps and numbness in left foot, symptoms for 2 months.   EXAM: MRI LUMBAR SPINE WITHOUT CONTRAST   TECHNIQUE: Multiplanar, multisequence MR imaging of the lumbar spine was performed. No intravenous contrast was  administered.   COMPARISON:  No pertinent prior studies available for comparison.   FINDINGS: Segmentation: For the purposes of this dictation, five lumbar vertebrae are assumed and the caudal most well-formed intervertebral disc is designated L5-S1.   Alignment: Straightening of the expected lumbar lordosis. No significant spondylolisthesis   Vertebrae: Vertebral body height is maintained. Multilevel degenerative endplate irregularity. Trace degenerative endplate edema at the 624THL, L4-L5 and L5-S1 levels   Conus medullaris and cauda equina: Conus extends to the L1 level. No signal abnormality within the visualized distal spinal cord.   Paraspinal and other soft tissues: No abnormality identified within included portions of the abdomen/retroperitoneum. Atrophy of the lumbar paraspinal musculature.   Disc levels:   Moderate/advanced disc degeneration at the L2-L3 through L5-S1 levels. Mild disc degeneration at L1-L2.   T12-L1: No disc herniation. No significant canal or foraminal stenosis.   L1-L2: Mild facet arthrosis. No disc herniation. No significant canal or foraminal stenosis.   L2-L3: Disc bulge with circumferential osteophyte ridge. Disc osteophyte ridge is more focally prominent within the left subarticular zone. Mild facet arthrosis/ligamentum flavum hypertrophy. Moderate left subarticular narrowing with crowding of the descending left L3 nerve root (series 5, image 15). Central canal patent. No significant foraminal stenosis.   L3-L4: Disc bulge with endplate spurring. Moderate facet arthrosis/ligamentum flavum hypertrophy (greater on the left). Mild left subarticular narrowing without nerve root impingement. Central canal patent. Mild bilateral neural foraminal narrowing.   L4-L5: Disc bulge with endplate spurring. Superimposed shallow right center/subarticular disc protrusion. Mild facet arthrosis/ligamentum flavum hypertrophy. Moderate right subarticular  narrowing with contact upon the descending right L5 nerve root. Central canal patent. Mild right neural foraminal narrowing.   L5-S1: Disc bulge with endplate spurring. Superimposed  broad-based right foraminal disc protrusion. Moderate facet arthrosis/ligamentum flavum hypertrophy. Mild bilateral subarticular narrowing (greater on the left) without frank nerve root impingement. Central canal patent. Bilateral neural foraminal narrowing (moderate right, mild left).   IMPRESSION: Lumbar spondylosis as outlined and most notably as follows.   Moderate/advanced disc degeneration at the L2-L3 through L5-S1 levels.   At L2-L3, disc osteophyte ridge contributes to multifactorial moderate left subarticular narrowing with crowding of the descending left L3 nerve root.   At L4-L5, a shallow right center/subarticular disc protrusion contributes to moderate right subarticular narrowing with contact upon the descending right L5 nerve root. Mild multifactorial right neural foraminal narrowing also present at this level.   At L5-S1, a broad-based disc protrusion contributes to multifactorial moderate right neural foraminal narrowing. Mild bilateral subarticular and left neural foraminal narrowing also present at this level.   Additional sites of mild subarticular and neural foraminal narrowing as described".      COGNITION: Overall cognitive status: Within functional limits for tasks assessed   SENSATION: In tact to light touch BLE wit testing, but does report numbness felt in LLE  COORDINATION:  WNL BLE   EDEMA:  Pt reports mild swelling in BLE (reports doctor aware)   MUSCLE LENGTH: deferred   POSTURE: abnormal, noted particularly with standing/gait, pt with R trunk lean,  LOWER EXTREMITY MMT:     BLE musculature generally 4+/5 with the following exceptions:  hip flexors 4-/5 bilat, hip er/ir each 4+/5 B      LUMBAR ROM:   AROM eval  Flexion 80%*  Extension WFL,  however painful  Right lateral flexion 90%*  Left lateral flexion 70%*  Right rotation 80%*  Left rotation WFL   (Blank rows = not tested) *=pain limited   Palpation: Assessment performed in seated, pt found to have no tenderness with palpation throughout low back musculature  No tenderness found along T-spine over lumbar spine, however, further assessment with pt in prone needed to assess for mobility    LUMBAR SPECIAL TESTS:  deferred  TRANSFERS: Assistive device utilized: None  Sit to stand:  hands-free but painful Stand to sit: Complete Independence Chair to chair: Complete Independence   GAIT: Gait pattern:  noted decreased trunk rotation, R lean, decrease gait speed Distance walked: 10 meters, clinic distances  Assistive device utilized: None Level of assistance: SBA  FUNCTIONAL TESTS:  5 times sit to stand: 15 sec hands free but painful in low back and bilat knees 10MWT: 0.75 m/s no AD, somewhat painful, pt with R trunk lean, decreased trunk rotation  PATIENT SURVEYS:  Modified Oswestry 40%  FOTO 44  TODAY'S TREATMENT:                                                                                                                              DATE: eval only    PATIENT EDUCATION: Education details: assessment findings, goals, plan Person educated: Patient Education method: Explanation Education comprehension: verbalized understanding  HOME EXERCISE PROGRAM: To be initiated next 1-2 visits   GOALS: Goals reviewed with patient? Yes     SHORT TERM GOALS: Target date: 03/09/2023   Patient will be independent in home exercise program to improve strength/mobility for better functional independence with ADLs. Baseline:to be initiated Goal status: INITIAL   LONG TERM GOALS: Target date: 04/20/2023   Patient will increase FOTO score to equal to or greater than  54 to demonstrate statistically significant improvement in mobility and quality of life.   Baseline: 44 Goal status: INITIAL  2.  Patient  will complete five times sit to stand test in < 10 seconds indicating an increased LE strength and improved balance. Baseline: 14 seconds, painful Goal status: INITIAL  3.  Patient will increase Berg Balance score by > 6 points to demonstrate decreased fall risk during functional activities Baseline: to be completed next 1-2 visits Goal status: INITIAL  4.  Patient will increase 10 meter walk test to >1.55ms as to improve gait speed for better community ambulation and to reduce fall risk. Baseline: 0.75 m/s, painful Goal status: INITIAL  6. Patient will reduce modified Oswestry score to <20 as to demonstrate minimal disability with ADLs including improved sleeping tolerance, walking/sitting tolerance etc for better mobility with ADLs.   Baseline: 40% Goal status: INITIAL   ASSESSMENT:  CLINICAL IMPRESSION: Patient is a pleasant 82y.o. female who was seen today for physical therapy evaluation and treatment for chronic LBP with pain into BLE (LLE worse than RLE). History and exam findings indicate impairments of pain, posture, ROM, mobility, strength, gait speed/mechanics and balance. Pt modified Oswestry score indicates moderate disability due to low back pain. Berg to be completed next session as pt with hx of unsteadiness/decreased balance. The pt will benefit from further skilled PT to address these deficits in order to improve QOL, functional mobility and to decrease fall risk.   OBJECTIVE IMPAIRMENTS: Abnormal gait, decreased activity tolerance, decreased balance, decreased endurance, decreased mobility, difficulty walking, decreased ROM, decreased strength, improper body mechanics, postural dysfunction, and pain.   ACTIVITY LIMITATIONS: lifting, bending, standing, squatting, stairs, transfers, dressing, and locomotion level  PARTICIPATION LIMITATIONS: cleaning, laundry, shopping, community activity, and yard work  PERSONAL  FACTORS: Age, Fitness, Sex, Time since onset of injury/illness/exacerbation, and 3+ comorbidities: PMH includes anxiety, arthritis, DM without complication, dysrhythmia, GERD, hx of palpitations, HOH, HLD, MVP, retinal detachment, hx total knee arthroplasty L 05/03/2022  are also affecting patient's functional outcome.   REHAB POTENTIAL: Fair    CLINICAL DECISION MAKING: Evolving/moderate complexity  EVALUATION COMPLEXITY: Moderate  PLAN:  PT FREQUENCY: 2x/week  PT DURATION: 12 weeks  PLANNED INTERVENTIONS: Therapeutic exercises, Therapeutic activity, Neuromuscular re-education, Balance training, Gait training, Patient/Family education, Self Care, Joint mobilization, Joint manipulation, Stair training, Vestibular training, Canalith repositioning, Orthotic/Fit training, DME instructions, Dry Needling, Electrical stimulation, Spinal manipulation, Spinal mobilization, Cryotherapy, Moist heat, Splintting, Taping, Traction, Manual therapy, and Re-evaluation  PLAN FOR NEXT SESSION: BERG, further palpation/assessment of thoracic and lumbar spine with pt in prone, muscle length testing, initiate HEP gentle strengthening and mobility   HZollie Pee PT 01/26/2023, 9:34 PM

## 2023-01-31 ENCOUNTER — Ambulatory Visit: Payer: Medicare Other

## 2023-02-01 ENCOUNTER — Ambulatory Visit: Payer: Medicare Other

## 2023-02-01 DIAGNOSIS — M5459 Other low back pain: Secondary | ICD-10-CM | POA: Diagnosis not present

## 2023-02-01 DIAGNOSIS — R2681 Unsteadiness on feet: Secondary | ICD-10-CM

## 2023-02-01 DIAGNOSIS — M6281 Muscle weakness (generalized): Secondary | ICD-10-CM

## 2023-02-01 NOTE — Therapy (Signed)
OUTPATIENT PHYSICAL THERAPY NEURO TREATMENT NOTE   Patient Name: Carla May MRN: [id number] DOB:03/16/1941, 82 y.o., female Today's Date: 02/01/2023   PCP: Mckinley Jewel, MD  REFERRING PROVIDER:  Charlett Blake, MD     END OF SESSION:  PT End of Session - 02/01/23 1107     Visit Number 2    Number of Visits 25    Date for PT Re-Evaluation 04/20/23    PT Start Time 1018    PT Stop Time 1102    PT Time Calculation (min) 44 min    Equipment Utilized During Treatment Gait belt    Activity Tolerance Patient tolerated treatment well;Patient limited by pain    Behavior During Therapy WFL for tasks assessed/performed              Past Medical History:  Diagnosis Date   Anxiety    Arthritis    back. neck   Diabetes mellitus without complication (HCC)    Dysrhythmia    GERD (gastroesophageal reflux disease)    History of palpitations    HOH (hard of hearing) 06/28/2019   Hyperlipidemia    diet controlled and fish oil   MVP (mitral valve prolapse)    never has caused any problems per patient on 06/27/19   PONV (postoperative nausea and vomiting)    No problems in 08/2018; however has had previous problems   Pre-diabetes    diet controlled, no med   Retinal detachment    OU   SVD (spontaneous vaginal delivery)    x 2   Past Surgical History:  Procedure Laterality Date   ABDOMINAL HYSTERECTOMY  1970's   AIR/FLUID EXCHANGE Left 06/28/2019   Procedure: Air/Fluid Exchange;  Surgeon: Bernarda Caffey, MD;  Location: [facility];  Service: Ophthalmology;  Laterality: Left;   CATARACT EXTRACTION Bilateral    Dr. Talbert Forest   COLONOSCOPY     polyp   EYE SURGERY Bilateral    Cat Sx and RD repair   GAS INSERTION Right 11/26/2014   Procedure: INSERTION OF GAS;  Surgeon: Hayden Pedro, MD;  Location: [facility];  Service: Ophthalmology;  Laterality: Right;  C3F8   GAS/FLUID EXCHANGE Left 08/21/2018   Procedure: GAS/FLUID EXCHANGE;  Surgeon: Bernarda Caffey, MD;  Location: [facility];  Service: Ophthalmology;  Laterality: Left;  C3F8   KNEE ARTHROSCOPY Left    LASER PHOTO ABLATION Left 06/28/2019   Procedure: Laser Photo Ablation;  Surgeon: Bernarda Caffey, MD;  Location: [facility];  Service: Ophthalmology;  Laterality: Left;   PARS PLANA VITRECTOMY Left 06/28/2019   Procedure: PARS PLANA VITRECTOMY WITH 25 GAUGE;  Surgeon: Bernarda Caffey, MD;  Location: [facility];  Service: Ophthalmology;  Laterality: Left;   PHOTOCOAGULATION WITH LASER Right 11/26/2014   Procedure: PHOTOCOAGULATION WITH LASER;  Surgeon: Hayden Pedro, MD;  Location: [facility];  Service: Ophthalmology;  Laterality: Right;   PHOTOCOAGULATION WITH LASER Left 08/21/2018   Procedure: PHOTOCOAGULATION WITH LASER;  Surgeon: Bernarda Caffey, MD;  Location: [place];  Service: Ophthalmology;  Laterality: Left;   REPAIR OF COMPLEX TRACTION RETINAL DETACHMENT Left 12/06/2018   Procedure: REPAIR OF COMPLEX TRACTION RETINAL DETACHMENT, 25 gauge vitrectomy, endolaser photocoagulation, memebrane peel, perfluoron injection,  and silicone oil;  Surgeon: Bernarda Caffey, MD;  Location: [facility];  Service: Ophthalmology;  Laterality: Left;   RETINAL DETACHMENT SURGERY Bilateral    SBP OD - Dr. Tempie Hoist (1.12.16).  SBP - Dr. Bernarda Caffey (10.7.19)   SCLERAL BUCKLE Right 11/26/2014   Procedure:  SCLERAL BUCKLE RIGHT EYE ;  Surgeon: Hayden Pedro, MD;  Location: [facility];  Service: Ophthalmology;  Laterality: Right;   SCLERAL BUCKLE WITH POSSIBLE 25 GAUGE PARS PLANA VITRECTOMY Left 08/21/2018   Procedure: SCLERAL BUCKLE WITH 25 GAUGE PARS PLANA VITRECTOMY;  Surgeon: Bernarda Caffey, MD;  Location: [place];  Service: Ophthalmology;  Laterality: Left;   SILICON OIL REMOVAL Left 2/77/4128   Procedure: Silicon Oil Removal;  Surgeon: Bernarda Caffey, MD;  Location: [facility];  Service: Ophthalmology;  Laterality: Left;   TONSILLECTOMY     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   Procedure: TOTAL KNEE ARTHROPLASTY;  Surgeon: Gaynelle Arabian, MD;  Location: [facility];   Service: Orthopedics;  Laterality: Left;   Patient Active Problem List   Diagnosis Date Noted   Hereditary and idiopathic peripheral neuropathy 11/29/2022   Numbness and tingling of foot 11/29/2022   Chronic bilateral low back pain with right-sided sciatica 09/06/2022   OA (osteoarthritis) of knee 05/03/2022   Osteoarthritis of left knee 05/03/2022   HOH (hard of hearing) 06/28/2019   Rhegmatogenous retinal detachment of right eye 11/26/2014    ONSET DATE: 2021, years ago  REFERRING DIAG: M48.062 (ICD-10-CM) - Spinal stenosis of lumbar region with neurogenic claudication   THERAPY DIAG:  Other low back pain  Muscle weakness (generalized)  Unsteadiness on feet  Rationale for Evaluation and Treatment: Rehabilitation  SUBJECTIVE:                                                                                                                                                                                             SUBJECTIVE STATEMENT: Pt reports moderate back and leg pain at the moment. She took a tylenol at about 7 am. No other updates/concerns.   Pt accompanied by: self  PERTINENT HISTORY:  The pt is a pleasant 82 yo female referred to PT for lumbar spinal stenosis with neurogenic claudication. Pt describes chronic low back pain with numbness of LLE. Pt reports she has had back pain for years. Her pain is more prominent in her RLE than LLE. She feels pain into her toes,describes it as a numbness, tightness. Pt trying PT first before pursuing injection for her back. She reports back pain is 10+/10. Pt takes Tylenol to manage pain. Currently LBP is impacting her ability to walk, which is limited to 5 minutes. She has difficulty lifting and performing transfers.. She reports poor control of RLE which makes it difficult to get dressed. Pt reports decreased balance. She stumbles when she stands up and has difficulty walking on uneven surfaces. She also feels out of breath easily. PMH  includes  anxiety, arthritis, DM without complication, dysrhythmia, GERD, hx of palpitations, HOH, HLD, MVP, retinal detachment, hx total knee arthroplasty L 05/03/2022  PAIN:  Are you having pain? Yes: NPRS scale: 10+/10 Pain location: LBP, BLE pain (L worse than R) Pain description: tightness, numbness Aggravating factors: movement Relieving factors: Tylenol  PRECAUTIONS: Fall  WEIGHT BEARING RESTRICTIONS: No  FALLS: Has patient fallen in last 6 months? No  LIVING ENVIRONMENT: Lives with: lives with their spouseSingle point cane, Walker - 2 wheeled, and shower chair Lives in: House/apartment Stairs: Yes: External: 2-3 steps; none Has following equipment at home:   PLOF: Independent  PATIENT GOALS: Pt would like to improve RLE flexibility and control, reducing pain, improving walking  OBJECTIVE:   DIAGNOSTIC FINDINGS:   MR lumbar spine 02/27/20 via chart Narrative & Impression  CLINICAL DATA:  Sciatica, unspecified laterality. Sciatica of left side. Additional history provided by technologist: Patient reports low back pain, worse on the left side, cramps and numbness in left foot, symptoms for 2 months.   EXAM: MRI LUMBAR SPINE WITHOUT CONTRAST   TECHNIQUE: Multiplanar, multisequence MR imaging of the lumbar spine was performed. No intravenous contrast was administered.   COMPARISON:  No pertinent prior studies available for comparison.   FINDINGS: Segmentation: For the purposes of this dictation, five lumbar vertebrae are assumed and the caudal most well-formed intervertebral disc is designated L5-S1.   Alignment: Straightening of the expected lumbar lordosis. No significant spondylolisthesis   Vertebrae: Vertebral body height is maintained. Multilevel degenerative endplate irregularity. Trace degenerative endplate edema at the 624THL, L4-L5 and L5-S1 levels   Conus medullaris and cauda equina: Conus extends to the L1 level. No signal abnormality within the  visualized distal spinal cord.   Paraspinal and other soft tissues: No abnormality identified within included portions of the abdomen/retroperitoneum. Atrophy of the lumbar paraspinal musculature.   Disc levels:   Moderate/advanced disc degeneration at the L2-L3 through L5-S1 levels. Mild disc degeneration at L1-L2.   T12-L1: No disc herniation. No significant canal or foraminal stenosis.   L1-L2: Mild facet arthrosis. No disc herniation. No significant canal or foraminal stenosis.   L2-L3: Disc bulge with circumferential osteophyte ridge. Disc osteophyte ridge is more focally prominent within the left subarticular zone. Mild facet arthrosis/ligamentum flavum hypertrophy. Moderate left subarticular narrowing with crowding of the descending left L3 nerve root (series 5, image 15). Central canal patent. No significant foraminal stenosis.   L3-L4: Disc bulge with endplate spurring. Moderate facet arthrosis/ligamentum flavum hypertrophy (greater on the left). Mild left subarticular narrowing without nerve root impingement. Central canal patent. Mild bilateral neural foraminal narrowing.   L4-L5: Disc bulge with endplate spurring. Superimposed shallow right center/subarticular disc protrusion. Mild facet arthrosis/ligamentum flavum hypertrophy. Moderate right subarticular narrowing with contact upon the descending right L5 nerve root. Central canal patent. Mild right neural foraminal narrowing.   L5-S1: Disc bulge with endplate spurring. Superimposed broad-based right foraminal disc protrusion. Moderate facet arthrosis/ligamentum flavum hypertrophy. Mild bilateral subarticular narrowing (greater on the left) without frank nerve root impingement. Central canal patent. Bilateral neural foraminal narrowing (moderate right, mild left).   IMPRESSION: Lumbar spondylosis as outlined and most notably as follows.   Moderate/advanced disc degeneration at the L2-L3 through  L5-S1 levels.   At L2-L3, disc osteophyte ridge contributes to multifactorial moderate left subarticular narrowing with crowding of the descending left L3 nerve root.   At L4-L5, a shallow right center/subarticular disc protrusion contributes to moderate right subarticular narrowing with contact upon the descending right L5 nerve  root. Mild multifactorial right neural foraminal narrowing also present at this level.   At L5-S1, a broad-based disc protrusion contributes to multifactorial moderate right neural foraminal narrowing. Mild bilateral subarticular and left neural foraminal narrowing also present at this level.   Additional sites of mild subarticular and neural foraminal narrowing as described".      COGNITION: Overall cognitive status: Within functional limits for tasks assessed   SENSATION: In tact to light touch BLE wit testing, but does report numbness felt in LLE  COORDINATION:  WNL BLE   EDEMA:  Pt reports mild swelling in BLE (reports doctor aware)   MUSCLE LENGTH: Hamstring length: LLE lacking 17 deg RLE lacking 15 deg   POSTURE: abnormal, noted particularly with standing/gait, pt with R trunk lean,  LOWER EXTREMITY MMT:     BLE musculature generally 4+/5 with the following exceptions:  hip flexors 4-/5 bilat, hip er/ir each 4+/5 B      LUMBAR ROM:   AROM eval  Flexion 80%*  Extension WFL, however painful  Right lateral flexion 90%*  Left lateral flexion 70%*  Right rotation 80%*  Left rotation WFL   (Blank rows = not tested) *=pain limited   Palpation: Assessment performed in seated, pt found to have no tenderness with palpation throughout low back musculature  No tenderness found along T-spine over lumbar spine, however, further assessment with pt in prone needed to assess for mobility    LUMBAR SPECIAL TESTS:  deferred  TRANSFERS: Assistive device utilized: None  Sit to stand:  hands-free but painful Stand to sit: Complete  Independence Chair to chair: Complete Independence   GAIT: Gait pattern:  noted decreased trunk rotation, R lean, decrease gait speed Distance walked: 10 meters, clinic distances  Assistive device utilized: None Level of assistance: SBA  FUNCTIONAL TESTS:  5 times sit to stand: 15 sec hands free but painful in low back and bilat knees 10MWT: 0.75 m/s no AD, somewhat painful, pt with R trunk lean, decreased trunk rotation  PATIENT SURVEYS:  Modified Oswestry 40%  FOTO 44 Berg: 53/56   TODAY'S TREATMENT:                                                                                                                              DATE:  NMR: Berg: 53/56, difficulty with SLB and tandem stance   TherEx: Palpation: Pt unable to tolerate prone due to L eye discomfort/reports of pressure. Testing in this position terminated. Improves when seated/ Assessment completed in seated. Pt reports no outright pain, but some discomfort throughout majority of spine with palpation, particularly upper thoracic.   On plinth: LTRs with and without hands-on assist x 20 reps each side. Cuing to perform in pain-free range Glute bridge 2x10. Cuing through pain-free range. Pt feels a stretch on the R side [place] march 20x alt LE SLR 2x10 each LE. Difficult Clamshells 2x15 each side.  Cuing to perform all interventions in pain-free range  Seated hamstring stretch 2x30 sec each LE  Issued and reviewed HEP    PATIENT EDUCATION: Education details: assessment findings, goals, plan Person educated: Patient Education method: Explanation Education comprehension: verbalized understanding  HOME EXERCISE PROGRAM: Access Code: [email or web address] URL: [email or web address] Date: 02/01/2023 Prepared by: Ricard Dillon  Exercises - Supine Bridge  - 1 x daily - 5 x weekly - 3 sets - 10 reps - Supine Straight Leg Raise  - 1 x daily - 5 x weekly - 2 sets - 10 reps - Clamshell  - 1 x daily - 5 x weekly  - 2 sets - 15 reps - Seated Hamstring Stretch  - 1 x daily - 7 x weekly - 1 sets - 2 reps - 30 seconds hold  GOALS: Goals reviewed with patient? Yes     SHORT TERM GOALS: Target date: 03/09/2023   Patient will be independent in home exercise program to improve strength/mobility for better functional independence with ADLs. Baseline:to be initiated Goal status: INITIAL   LONG TERM GOALS: Target date: 04/20/2023   Patient will increase FOTO score to equal to or greater than  54 to demonstrate statistically significant improvement in mobility and quality of life.  Baseline: 44 Goal status: INITIAL  2.  Patient  will complete five times sit to stand test in < 10 seconds indicating an increased LE strength and improved balance. Baseline: 14 seconds, painful Goal status: INITIAL  3.  Patient will increase Berg Balance tandem stance and SLB score by at least 1 pt to demonstrate decreased fall risk during functional activities Baseline: to be completed next 1-2 visits; 3/19: SLB 2/4, tandem 3/4 Goal status: REVISED  4.  Patient will increase 10 meter walk test to >1.23m/s as to improve gait speed for better community ambulation and to reduce fall risk. Baseline: 0.75 m/s, painful Goal status: INITIAL  6. Patient will reduce modified Oswestry score to <20 as to demonstrate minimal disability with ADLs including improved sleeping tolerance, walking/sitting tolerance etc for better mobility with ADLs.   Baseline: 40% Goal status: INITIAL   ASSESSMENT:  CLINICAL IMPRESSION: Further assessment complete. Pt generally with high score on Berg, with exception of SLB and tandem stance activities which showed impairment. Initiated LE strengthening and mobility interventions and provided pt with HEP. Instructed pt in what to expect/watch for with performing HEP, and to discontinue if she experiences significant increase in LBP. Currently pt able to tolerate all interventions well when modified  for range and/or intensity. The pt will benefit from further skilled PT to address these deficits in order to improve QOL, functional mobility and to decrease fall risk.   OBJECTIVE IMPAIRMENTS: Abnormal gait, decreased activity tolerance, decreased balance, decreased endurance, decreased mobility, difficulty walking, decreased ROM, decreased strength, improper body mechanics, postural dysfunction, and pain.   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   REHAB POTENTIAL: Fair    CLINICAL DECISION MAKING: Evolving/moderate complexity  EVALUATION COMPLEXITY: Moderate  PLAN:  PT FREQUENCY: 2x/week  PT DURATION: 12 weeks  PLANNED INTERVENTIONS: Therapeutic exercises, Therapeutic activity, Neuromuscular re-education, Balance training, Gait training, Patient/Family education, Self Care, Joint mobilization, Joint manipulation, Stair training, Vestibular training,  Canalith repositioning, Orthotic/Fit training, DME instructions, Dry Needling, Electrical stimulation, Spinal manipulation, Spinal mobilization, Cryotherapy, Moist heat, Splintting, Taping, Traction, Manual therapy, and Re-evaluation  PLAN FOR NEXT SESSION: mobility, strength, balance   Zollie Pee, PT 02/01/2023, 11:14 AM

## 2023-02-03 ENCOUNTER — Ambulatory Visit: Payer: Medicare Other

## 2023-02-03 DIAGNOSIS — M6281 Muscle weakness (generalized): Secondary | ICD-10-CM

## 2023-02-03 DIAGNOSIS — M5459 Other low back pain: Secondary | ICD-10-CM | POA: Diagnosis not present

## 2023-02-03 NOTE — Therapy (Signed)
OUTPATIENT PHYSICAL THERAPY NEURO TREATMENT NOTE   Patient Name: BOBBY MURRAH MRN: [id number] DOB:[birth year], 82 y.o., female Today's Date: 02/03/2023   PCP: Mckinley Jewel, MD  REFERRING PROVIDER:  Charlett Blake, MD     END OF SESSION:  PT End of Session - 02/03/23 0849     Visit Number 3    Number of Visits 25    Date for PT Re-Evaluation 04/20/23    PT Start Time 0846    PT Stop Time 0927    PT Time Calculation (min) 41 min    Equipment Utilized During Treatment Gait belt    Activity Tolerance Patient tolerated treatment well;Patient limited by pain    Behavior During Therapy WFL for tasks assessed/performed              Past Medical History:  Diagnosis Date   Anxiety    Arthritis    back. neck   Diabetes mellitus without complication (HCC)    Dysrhythmia    GERD (gastroesophageal reflux disease)    History of palpitations    HOH (hard of hearing) 06/28/2019   Hyperlipidemia    diet controlled and fish oil   MVP (mitral valve prolapse)    never has caused any problems per patient on 06/27/19   PONV (postoperative nausea and vomiting)    No problems in 08/2018; however has had previous problems   Pre-diabetes    diet controlled, no med   Retinal detachment    OU   SVD (spontaneous vaginal delivery)    x 2   Past Surgical History:  Procedure Laterality Date   ABDOMINAL HYSTERECTOMY  1970's   AIR/FLUID EXCHANGE Left 06/28/2019   Procedure: Air/Fluid Exchange;  Surgeon: Bernarda Caffey, MD;  Location: [place];  Service: Ophthalmology;  Laterality: Left;   CATARACT EXTRACTION Bilateral    Dr. Talbert Forest   COLONOSCOPY     polyp   EYE SURGERY Bilateral    Cat Sx and RD repair   GAS INSERTION Right 11/26/2014   Procedure: INSERTION OF GAS;  Surgeon: Hayden Pedro, MD;  Location: [place];  Service: Ophthalmology;  Laterality: Right;  C3F8   GAS/FLUID EXCHANGE Left 08/21/2018   Procedure: GAS/FLUID EXCHANGE;  Surgeon: Bernarda Caffey, MD;  Location: [place];  Service: Ophthalmology;  Laterality: Left;  C3F8   KNEE ARTHROSCOPY Left    LASER PHOTO ABLATION Left 06/28/2019   Procedure: Laser Photo Ablation;  Surgeon: Bernarda Caffey, MD;  Location: [place];  Service: Ophthalmology;  Laterality: Left;   PARS PLANA VITRECTOMY Left 06/28/2019   Procedure: PARS PLANA VITRECTOMY WITH 25 GAUGE;  Surgeon: Bernarda Caffey, MD;  Location: [place];  Service: Ophthalmology;  Laterality: Left;   PHOTOCOAGULATION WITH LASER Right 11/26/2014   Procedure: PHOTOCOAGULATION WITH LASER;  Surgeon: Hayden Pedro, MD;  Location: [place];  Service: Ophthalmology;  Laterality: Right;   PHOTOCOAGULATION WITH LASER Left 08/21/2018   Procedure: PHOTOCOAGULATION WITH LASER;  Surgeon: Bernarda Caffey, MD;  Location: [place];  Service: Ophthalmology;  Laterality: Left;   REPAIR OF COMPLEX TRACTION RETINAL DETACHMENT Left 12/06/2018   Procedure: REPAIR OF COMPLEX TRACTION RETINAL DETACHMENT, 25 gauge vitrectomy, endolaser photocoagulation, memebrane peel, perfluoron injection,  and silicone oil;  Surgeon: Bernarda Caffey, MD;  Location: [place];  Service: Ophthalmology;  Laterality: Left;   RETINAL DETACHMENT SURGERY Bilateral    SBP OD - Dr. Tempie Hoist (1.12.16).  SBP - Dr. Bernarda Caffey (10.7.19)   SCLERAL BUCKLE Right 11/26/2014   Procedure:  SCLERAL BUCKLE RIGHT EYE ;  Surgeon: Hayden Pedro, MD;  Location: [place];  Service: Ophthalmology;  Laterality: Right;   SCLERAL BUCKLE WITH POSSIBLE 25 GAUGE PARS PLANA VITRECTOMY Left 08/21/2018   Procedure: SCLERAL BUCKLE WITH 25 GAUGE PARS PLANA VITRECTOMY;  Surgeon: Bernarda Caffey, MD;  Location: [place];  Service: Ophthalmology;  Laterality: Left;   SILICON OIL REMOVAL Left [id number]   Procedure: Silicon Oil Removal;  Surgeon: Bernarda Caffey, MD;  Location: [place];  Service: Ophthalmology;  Laterality: Left;   TONSILLECTOMY     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   Procedure: TOTAL KNEE ARTHROPLASTY;  Surgeon: Gaynelle Arabian, MD;  Location: [facility];   Service: Orthopedics;  Laterality: Left;   Patient Active Problem List   Diagnosis Date Noted   Hereditary and idiopathic peripheral neuropathy 11/29/2022   Numbness and tingling of foot 11/29/2022   Chronic bilateral low back pain with right-sided sciatica 09/06/2022   OA (osteoarthritis) of knee 05/03/2022   Osteoarthritis of left knee 05/03/2022   HOH (hard of hearing) 06/28/2019   Rhegmatogenous retinal detachment of right eye 11/26/2014    ONSET DATE: 2021, years ago  REFERRING DIAG: M48.062 (ICD-10-CM) - Spinal stenosis of lumbar region with neurogenic claudication   THERAPY DIAG:  Other low back pain  Muscle weakness (generalized)  Rationale for Evaluation and Treatment: Rehabilitation  SUBJECTIVE:                                                                                                                                                                                             SUBJECTIVE STATEMENT: Pt reports pain is about the same. She had a chance to try her HEP, went fine. She reports no stumbles/falls. She reports continued eye problems, has appointment tomorrow morning.   Pt accompanied by: self  PERTINENT HISTORY:  The pt is a pleasant 82 yo female referred to PT for lumbar spinal stenosis with neurogenic claudication. Pt describes chronic low back pain with numbness of LLE. Pt reports she has had back pain for years. Her pain is more prominent in her RLE than LLE. She feels pain into her toes,describes it as a numbness, tightness. Pt trying PT first before pursuing injection for her back. She reports back pain is 10+/10. Pt takes Tylenol to manage pain. Currently LBP is impacting her ability to walk, which is limited to 5 minutes. She has difficulty lifting and performing transfers.. She reports poor control of RLE which makes it difficult to get dressed. Pt reports decreased balance. She stumbles when she stands up and has difficulty walking on uneven surfaces.  She also feels out  of breath easily. 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 Yes: NPRS scale: 10+/10 Pain location: LBP, BLE pain (L worse than R) Pain description: tightness, numbness Aggravating factors: movement Relieving factors: Tylenol  PRECAUTIONS: Fall  WEIGHT BEARING RESTRICTIONS: No  FALLS: Has patient fallen in last 6 months? No  LIVING ENVIRONMENT: Lives with: lives with their spouseSingle point cane, Walker - 2 wheeled, and shower chair Lives in: House/apartment Stairs: Yes: External: 2-3 steps; none Has following equipment at home:   PLOF: Independent  PATIENT GOALS: Pt would like to improve RLE flexibility and control, reducing pain, improving walking  OBJECTIVE:   DIAGNOSTIC FINDINGS:   MR lumbar spine 02/27/20 via chart Narrative & Impression  CLINICAL DATA:  Sciatica, unspecified laterality. Sciatica of left side. Additional history provided by technologist: Patient reports low back pain, worse on the left side, cramps and numbness in left foot, symptoms for 2 months.   EXAM: MRI LUMBAR SPINE WITHOUT CONTRAST   TECHNIQUE: Multiplanar, multisequence MR imaging of the lumbar spine was performed. No intravenous contrast was administered.   COMPARISON:  No pertinent prior studies available for comparison.   FINDINGS: Segmentation: For the purposes of this dictation, five lumbar vertebrae are assumed and the caudal most well-formed intervertebral disc is designated L5-S1.   Alignment: Straightening of the expected lumbar lordosis. No significant spondylolisthesis   Vertebrae: Vertebral body height is maintained. Multilevel degenerative endplate irregularity. Trace degenerative endplate edema at the 624THL, L4-L5 and L5-S1 levels   Conus medullaris and cauda equina: Conus extends to the L1  level. No signal abnormality within the visualized distal spinal cord.   Paraspinal and other soft tissues: No abnormality identified within included portions of the abdomen/retroperitoneum. Atrophy of the lumbar paraspinal musculature.   Disc levels:   Moderate/advanced disc degeneration at the L2-L3 through L5-S1 levels. Mild disc degeneration at L1-L2.   T12-L1: No disc herniation. No significant canal or foraminal stenosis.   L1-L2: Mild facet arthrosis. No disc herniation. No significant canal or foraminal stenosis.   L2-L3: Disc bulge with circumferential osteophyte ridge. Disc osteophyte ridge is more focally prominent within the left subarticular zone. Mild facet arthrosis/ligamentum flavum hypertrophy. Moderate left subarticular narrowing with crowding of the descending left L3 nerve root (series 5, image 15). Central canal patent. No significant foraminal stenosis.   L3-L4: Disc bulge with endplate spurring. Moderate facet arthrosis/ligamentum flavum hypertrophy (greater on the left). Mild left subarticular narrowing without nerve root impingement. Central canal patent. Mild bilateral neural foraminal narrowing.   L4-L5: Disc bulge with endplate spurring. Superimposed shallow right center/subarticular disc protrusion. Mild facet arthrosis/ligamentum flavum hypertrophy. Moderate right subarticular narrowing with contact upon the descending right L5 nerve root. Central canal patent. Mild right neural foraminal narrowing.   L5-S1: Disc bulge with endplate spurring. Superimposed broad-based right foraminal disc protrusion. Moderate facet arthrosis/ligamentum flavum hypertrophy. Mild bilateral subarticular narrowing (greater on the left) without frank nerve root impingement. Central canal patent. Bilateral neural foraminal narrowing (moderate right, mild left).   IMPRESSION: Lumbar spondylosis as outlined and most notably as follows.   Moderate/advanced disc  degeneration at the L2-L3 through L5-S1 levels.   At L2-L3, disc osteophyte ridge contributes to multifactorial moderate left subarticular narrowing with crowding of the descending left L3 nerve root.   At L4-L5, a shallow right center/subarticular disc protrusion contributes to moderate right subarticular narrowing with contact upon  the descending right L5 nerve root. Mild multifactorial right neural foraminal narrowing also present at this level.   At L5-S1, a broad-based disc protrusion contributes to multifactorial moderate right neural foraminal narrowing. Mild bilateral subarticular and left neural foraminal narrowing also present at this level.   Additional sites of mild subarticular and neural foraminal narrowing as described".      COGNITION: Overall cognitive status: Within functional limits for tasks assessed   SENSATION: In tact to light touch BLE wit testing, but does report numbness felt in LLE  COORDINATION:  WNL BLE   EDEMA:  Pt reports mild swelling in BLE (reports doctor aware)   MUSCLE LENGTH: Hamstring length: LLE lacking 17 deg RLE lacking 15 deg   POSTURE: abnormal, noted particularly with standing/gait, pt with R trunk lean,  LOWER EXTREMITY MMT:     BLE musculature generally 4+/5 with the following exceptions:  hip flexors 4-/5 bilat, hip er/ir each 4+/5 B      LUMBAR ROM:   AROM eval  Flexion 80%*  Extension WFL, however painful  Right lateral flexion 90%*  Left lateral flexion 70%*  Right rotation 80%*  Left rotation WFL   (Blank rows = not tested) *=pain limited   Palpation: Assessment performed in seated, pt found to have no tenderness with palpation throughout low back musculature  No tenderness found along T-spine over lumbar spine, however, further assessment with pt in prone needed to assess for mobility    LUMBAR SPECIAL TESTS:  deferred  TRANSFERS: Assistive device utilized: None  Sit to stand:  hands-free but  painful Stand to sit: Complete Independence Chair to chair: Complete Independence   GAIT: Gait pattern:  noted decreased trunk rotation, R lean, decrease gait speed Distance walked: 10 meters, clinic distances  Assistive device utilized: None Level of assistance: SBA  FUNCTIONAL TESTS:  5 times sit to stand: 15 sec hands free but painful in low back and bilat knees 10MWT: 0.75 m/s no AD, somewhat painful, pt with R trunk lean, decreased trunk rotation  PATIENT SURVEYS:  Modified Oswestry 40%  FOTO 44 Berg: 53/56   TODAY'S TREATMENT:                                                                                                                              DATE:  TherEx: Seated hamstring stretch 60 sec each LE Seated stability ball rollouts FWD/BCKWD x multiple reps Seated stability ball rollouts LTL x multiple reps   On mat table: LTRs with and without hands-on assist x 10 reps each side. Cuing to perform in pain-free range Sciatic nerve glides 10x each LE  Glute bridge 3x10. Cuing through pain-free range.  Hooklye march 20x alt LE Knee-to-chest stretch 30 sec each LE SLR 1x12, 1x10 each LE. Medium LTRs 10x each side Open book 10x each side Clamshells 1x15 each side.  Pt rates easier on the R. Side-lye hip abd 1x8 each LE. Terminated after 8 reps due  to reports of pain felt in R hip. Seated thoracic ext over chair 5x Seated trunk rotation in chair x multiple reps  Seated figure-four stretch 2x30 sec each LE, increased tightness felt on R side   Cuing to perform all interventions in pain-free range  TherAct Instruction in log roll technique to improve comfort with bed mobility 1x   PATIENT EDUCATION: Education details: assessment findings, goals, plan Person educated: Patient Education method: Explanation Education comprehension: verbalized understanding  HOME EXERCISE PROGRAM: Access Code: [email or web address] URL: [email or web address] Date:  02/01/2023 Prepared by: Ricard Dillon  Exercises - Supine Bridge  - 1 x daily - 5 x weekly - 3 sets - 10 reps - Supine Straight Leg Raise  - 1 x daily - 5 x weekly - 2 sets - 10 reps - Clamshell  - 1 x daily - 5 x weekly - 2 sets - 15 reps - Seated Hamstring Stretch  - 1 x daily - 7 x weekly - 1 sets - 2 reps - 30 seconds hold  GOALS: Goals reviewed with patient? Yes     SHORT TERM GOALS: Target date: 03/09/2023   Patient will be independent in home exercise program to improve strength/mobility for better functional independence with ADLs. Baseline:to be initiated Goal status: INITIAL   LONG TERM GOALS: Target date: 04/20/2023   Patient will increase FOTO score to equal to or greater than  54 to demonstrate statistically significant improvement in mobility and quality of life.  Baseline: 44 Goal status: INITIAL  2.  Patient  will complete five times sit to stand test in < 10 seconds indicating an increased LE strength and improved balance. Baseline: 14 seconds, painful Goal status: INITIAL  3.  Patient will increase Berg Balance tandem stance and SLB score by at least 1 pt to demonstrate decreased fall risk during functional activities Baseline: to be completed next 1-2 visits; 3/19: SLB 2/4, tandem 3/4 Goal status: REVISED  4.  Patient will increase 10 meter walk test to >1.24m/s as to improve gait speed for better community ambulation and to reduce fall risk. Baseline: 0.75 m/s, painful Goal status: INITIAL  6. Patient will reduce modified Oswestry score to <20 as to demonstrate minimal disability with ADLs including improved sleeping tolerance, walking/sitting tolerance etc for better mobility with ADLs.   Baseline: 40% Goal status: INITIAL   ASSESSMENT:  CLINICAL IMPRESSION: Pt tolerates majority of interventions well without pain today and without sensation of tightness in R low back. Some exceptions were open book where range was modified due to R shoulder  discomfort and side-lye hip abduction R side, which was terminated after 8 reps due to hip discomfort.  The pt will benefit from further skilled PT to address these deficits in order to improve QOL, functional mobility and to decrease fall risk.   OBJECTIVE IMPAIRMENTS: Abnormal gait, decreased activity tolerance, decreased balance, decreased endurance, decreased mobility, difficulty walking, decreased ROM, decreased strength, improper body mechanics, postural dysfunction, and pain.   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   REHAB POTENTIAL: Fair    CLINICAL DECISION MAKING: Evolving/moderate complexity  EVALUATION COMPLEXITY: Moderate  PLAN:  PT FREQUENCY: 2x/week  PT DURATION: 12 weeks  PLANNED INTERVENTIONS: Therapeutic exercises, Therapeutic activity, Neuromuscular re-education, Balance training, Gait training, Patient/Family education, Self Care, Joint mobilization, Joint manipulation, Stair training, Vestibular training, Canalith repositioning, Orthotic/Fit training, DME instructions, Dry Needling, Electrical stimulation, Spinal manipulation, Spinal mobilization, Cryotherapy, Moist heat, Splintting, Taping, Traction, Manual therapy, and Re-evaluation  PLAN FOR NEXT SESSION: mobility, strength, balance   Zollie Pee, PT 02/03/2023, 9:29 AM

## 2023-02-03 NOTE — Progress Notes (Signed)
[facility] Note  02/04/2023     CHIEF COMPLAINT Patient presents for Retina Follow Up  HISTORY OF PRESENT ILLNESS: Carla May is a 82 y.o. female who presents to the clinic today for:   HPI     Retina Follow Up   Patient presents with  Other.  In left eye.  This started years ago.  Severity is moderate.  Duration of 2.  Since onset it is stable.  I, the attending physician,  performed the HPI with the patient and updated documentation appropriately.        Comments   Patient states that the left eye has spells of pain. She is seeing white in the middle of peoples faces. Everything is blurry and she can not read. She is using Pred and Prolensa OS QD.       Last edited by Bernarda Caffey, MD on 02/04/2023 12:32 PM.    Pt states she is having pain in her left eye, she is using PF and Prolensa OS QD, she states she cannot see with her new glasses, she cannot read, she states she tried using her old glasses, but they do not help either, she states she used to be able to read with a magnifying glass, but some days that does not help either, she says early in the morning when she tries to read, she sees white in the middle of her vision, she sees a large floater in her right eye that seems to "splatter open" if she is physically active in some way and it looks like pepper in her eyes  Referring physician: [facility] [address],  [place] [address]  HISTORICAL INFORMATION:   Selected notes from the MEDICAL RECORD NUMBER Referred by Dr. Len Blalock for concern of retinal hole.   CURRENT MEDICATIONS: Current Outpatient Medications (Ophthalmic Drugs)  Medication Sig   Bromfenac Sodium (PROLENSA) 0.07 % SOLN Place 1 drop into the left eye 3 (three) times daily.   carboxymethylcellulose (REFRESH PLUS) 0.5 % SOLN Place 1 drop into both eyes 4 (four) times daily as needed (for dry eyes).   prednisoLONE acetate  (PRED FORTE) 1 % ophthalmic suspension Place 1 drop into the left eye 3 (three) times daily.   No current facility-administered medications for this visit. (Ophthalmic Drugs)   Current Outpatient Medications (Other)  Medication Sig   acetaminophen (TYLENOL) 650 MG CR tablet Take 650 mg by mouth every 6 (six) hours as needed.   aspirin EC 81 MG tablet Take 81 mg by mouth daily. Swallow whole.   b complex vitamins capsule Take 1 capsule by mouth daily.   icosapent Ethyl (VASCEPA) 1 g capsule Take 2 g by mouth 2 (two) times daily.   Multiple Vitamin (MULTIVITAMIN WITH MINERALS) TABS tablet Take 1 tablet by mouth daily. One-A-Day for Women 50+   omeprazole (PRILOSEC OTC) 20 MG tablet Take 20 mg by mouth daily.   Current Facility-Administered Medications (Other)  Medication Route   triamcinolone acetonide (KENALOG-40) injection 4 mg Intravitreal   REVIEW OF SYSTEMS: ROS   Positive for: Gastrointestinal, Neurological, Musculoskeletal, HENT, Eyes Negative for: Constitutional, Skin, Genitourinary, Endocrine, Cardiovascular, Respiratory, Psychiatric, Allergic/Imm, Heme/Lymph Last edited by Annie Paras, COT on 02/04/2023  8:36 AM.      ALLERGIES Allergies  Allergen Reactions   Atorvastatin Other (See Comments)    Muscle aches/ memory loss   Penicillins Itching and Nausea Only  Did it involve swelling of the face/tongue/throat, SOB, or low BP? Unknown Did it involve sudden or severe rash/hives, skin peeling, or any reaction on the inside of your mouth or nose? Itching Did you need to seek medical attention at a hospital or doctor's office? No When did it last happen? 1970s If all above answers are "NO", may proceed with cephalosporin use.  Tolerated Cephalosporin Date: 05/04/22.   Other reaction(s): Not available   Sulfa Antibiotics Nausea Only and Other (See Comments)   PAST MEDICAL HISTORY Past Medical History:  Diagnosis Date   Anxiety    Arthritis    back. neck    Diabetes mellitus without complication (HCC)    Dysrhythmia    GERD (gastroesophageal reflux disease)    History of palpitations    HOH (hard of hearing) 06/28/2019   Hyperlipidemia    diet controlled and fish oil   MVP (mitral valve prolapse)    never has caused any problems per patient on 06/27/19   PONV (postoperative nausea and vomiting)    No problems in 08/2018; however has had previous problems   Pre-diabetes    diet controlled, no med   Retinal detachment    OU   SVD (spontaneous vaginal delivery)    x 2   Past Surgical History:  Procedure Laterality Date   ABDOMINAL HYSTERECTOMY  1970's   AIR/FLUID EXCHANGE Left 06/28/2019   Procedure: Air/Fluid Exchange;  Surgeon: Bernarda Caffey, MD;  Location: [place];  Service: Ophthalmology;  Laterality: Left;   CATARACT EXTRACTION Bilateral    Dr. Talbert Forest   COLONOSCOPY     polyp   EYE SURGERY Bilateral    Cat Sx and RD repair   GAS INSERTION Right 11/26/2014   Procedure: INSERTION OF GAS;  Surgeon: Hayden Pedro, MD;  Location: [place];  Service: Ophthalmology;  Laterality: Right;  C3F8   GAS/FLUID EXCHANGE Left 08/21/2018   Procedure: GAS/FLUID EXCHANGE;  Surgeon: Bernarda Caffey, MD;  Location: [place];  Service: Ophthalmology;  Laterality: Left;  C3F8   KNEE ARTHROSCOPY Left    LASER PHOTO ABLATION Left 06/28/2019   Procedure: Laser Photo Ablation;  Surgeon: Bernarda Caffey, MD;  Location: [place];  Service: Ophthalmology;  Laterality: Left;   PARS PLANA VITRECTOMY Left 06/28/2019   Procedure: PARS PLANA VITRECTOMY WITH 25 GAUGE;  Surgeon: Bernarda Caffey, MD;  Location: [place];  Service: Ophthalmology;  Laterality: Left;   PHOTOCOAGULATION WITH LASER Right 11/26/2014   Procedure: PHOTOCOAGULATION WITH LASER;  Surgeon: Hayden Pedro, MD;  Location: [place];  Service: Ophthalmology;  Laterality: Right;   PHOTOCOAGULATION WITH LASER Left 08/21/2018   Procedure: PHOTOCOAGULATION WITH LASER;  Surgeon: Bernarda Caffey, MD;  Location: [place];  Service:  Ophthalmology;  Laterality: Left;   REPAIR OF COMPLEX TRACTION RETINAL DETACHMENT Left 12/06/2018   Procedure: REPAIR OF COMPLEX TRACTION RETINAL DETACHMENT, 25 gauge vitrectomy, endolaser photocoagulation, memebrane peel, perfluoron injection,  and silicone oil;  Surgeon: Bernarda Caffey, MD;  Location: [place];  Service: Ophthalmology;  Laterality: Left;   RETINAL DETACHMENT SURGERY Bilateral    SBP OD - Dr. Tempie Hoist (1.12.16).  SBP - Dr. Bernarda Caffey (10.7.19)   SCLERAL BUCKLE Right 11/26/2014   Procedure: SCLERAL BUCKLE RIGHT EYE ;  Surgeon: Hayden Pedro, MD;  Location: [place];  Service: Ophthalmology;  Laterality: Right;   SCLERAL BUCKLE WITH POSSIBLE 25 GAUGE PARS PLANA VITRECTOMY Left 08/21/2018   Procedure: SCLERAL BUCKLE WITH 25 GAUGE PARS PLANA VITRECTOMY;  Surgeon: Bernarda Caffey, MD;  Location: [place] [facility];  Service: Ophthalmology;  Laterality: Left;   SILICON OIL REMOVAL Left [id number]   Procedure: Silicon Oil Removal;  Surgeon: Bernarda Caffey, MD;  Location: [place];  Service: Ophthalmology;  Laterality: Left;   TONSILLECTOMY     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   Procedure: TOTAL KNEE ARTHROPLASTY;  Surgeon: Gaynelle Arabian, MD;  Location: [facility];  Service: Orthopedics;  Laterality: Left;   FAMILY HISTORY Family History  Problem Relation Age of Onset   Cancer Mother    Heart failure Father    SOCIAL HISTORY Social History   Tobacco Use   Smoking status: Former    Packs/day: 0.50    Years: 10.00    Additional pack years: 0.00    Total pack years: 5.00    Types: Cigarettes    Quit date: 1970    Years since quitting: 54.2   Smokeless tobacco: Never   Tobacco comments:    quit in 1970's  Vaping Use   Vaping Use: Never used  Substance Use Topics   Alcohol use: No   Drug use: No       OPHTHALMIC EXAM:  Base Eye Exam     Visual Acuity (Snellen - Linear)       Right Left   Dist cc 20/100 20/150 +2   Dist ph cc 20/40 +2 20/80 +2    Correction: Glasses          Tonometry (Tonopen, 8:45 AM)       Right Left   Pressure 14 18         Pupils       Dark Light Shape React APD   Right 3 2 Round Brisk None   Left 3 2 Round Brisk None         Visual Fields       Left Right    Full Full         Extraocular Movement       Right Left    Full, Ortho Full, Ortho         Neuro/Psych     Oriented x3: Yes   Mood/Affect: Normal         Dilation     Both eyes: 1.0% Mydriacyl, 2.5% Phenylephrine @ 8:36 AM           Slit Lamp and Fundus Exam     Slit Lamp Exam       Right Left   Lids/Lashes Dermatochalasis - upper lid Dermatochalasis - upper lid, mild MGD   Conjunctiva/Sclera White and quiet Stable improvement of focal nodule ST quad; focal injection ST quad, white and quiet   Cornea Trace Punctate epithelial erosions, Temporal Well healed cataract wounds, mild arcus, trace tear film debris well healed cataract wound, trace tear film debris   Anterior Chamber deep, 0.5+pigment Deep, 3+cell and pigment   Iris Round and dilated round and poorly dilated   Lens PC IOL in good position with open PC PC IOL in good position with open PC   Anterior Vitreous Vitreous syneresis, mild pigment in anterior vitreous, Posterior vitreous detachment; scattered vitreous debris Post vitrectomy, 1-2+cell/pigment         Fundus Exam       Right Left   Disc Pink and Sharp, Compact Sharp rim, mild tilt, trace temporal pallor, temporal Peripapillary atrophy, Compact   C/D Ratio 0.2 0.3   Macula Flat, Good foveal reflex, Retinal pigment epithelial mottling, ERM, No heme or edema Flat, blunted foveal reflex, +  epiretinal membrane, mild Retinal pigment epithelial mottling and clumping, cystic changes -- improved   Vessels attenuated, Tortuous attenuated, Tortuous   Periphery Attached over scleral buckle, good buckle height, good scarring over buckle Retina attached over buckle; good buckle height; good laser surrounding buckle w/ new row  posterior to buckle; +fibrosis at 0300 over buckle; retinotomy from 0300-0430 with good laser surrounding           Refraction     Wearing Rx       Sphere Cylinder Axis   Right -2.75 +1.75 157   Left -4.25 +1.75 117           IMAGING AND PROCEDURES  Imaging and Procedures for @TODAY @  OCT, Retina - OU - Both Eyes       Right Eye Quality was good. Central Foveal Thickness: 294. Progression has been stable. Findings include normal foveal contour, no IRF, no SRF, epiretinal membrane (Mild ERM -- stable).   Left Eye Quality was good. Central Foveal Thickness: 286. Progression has improved. Findings include normal foveal contour, no IRF, no SRF, epiretinal membrane (Interval improvement in cystic changes / IRF; Persistent ERM, mild vitreous opacities).   Notes *Images captured and stored on drive  Diagnosis / Impression:  OD: mild ERM; NFP; no IRF/SRF-stable from prior OS: Interval improvement in cystic changes / IRF; Persistent ERM, mild vitreous opacities  Clinical management:  See below  Abbreviations: NFP - Normal foveal profile. CME - cystoid macular edema. PED - pigment epithelial detachment. IRF - intraretinal fluid. SRF - subretinal fluid. EZ - ellipsoid zone. ERM - epiretinal membrane. ORA - outer retinal atrophy. ORT - outer retinal tubulation. SRHM - subretinal hyper-reflective material             ASSESSMENT/PLAN:    ICD-10-CM   1. CME (cystoid macular edema), left  H35.352 OCT, Retina - OU - Both Eyes    2. 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 History of retinal detachment  Z86.69     5. Pseudophakia of both eyes  Z96.1     6. Dry eyes  H04.123       1. CME OS   - history of recurrent post-op CME - s/p sub-tenons kenalog (03.13.20)  - interval increase / redevelopment of cystic changes OS on 12.09.21 and more recently on 12.20.22 -- restarted Prolensa QID OS (was still on PF QID OS)  - pt  follows with Dr. Helane Rima, who she saw in April 2023, he tapered PF to BID OS and Prolensa to once a day.  - Prolensa increased back to BID per Dr. Coralyn Pear (05.09.23),  - PF and Prolensa increased to 3-4/day (06.09.23), then back to BID on 07.11.23 - pt last saw Grewal on 11.9.23 -- he instructed her to use PF/Prolensa BID for 3 mos then reduce to qdaily until their f/u in April - today, pt presents acutely with eye pain, photophobia and decreased vision OS - BCVA OS decreased to 20/80 from 20/60 - OCT shows interval improvement in IRF/cystic changes (CME improved); Persistent ERM, mild vitreous opacities  - exam shows focal injection ST quadrant conj/sclera OS; +AC and anterior vit cell  - recommend increase PF and Prolensa back to TID OS  - discussed possible need for low dose maintenance therapy to keep CME and inflammation reduced  - appt with Dr. Helane Rima on March 03, 2023  - f/u here in 3 months --  DFE/OCT  2. Retinal detachment, OS             - originally: macula-sparing inferior retinal detachment from 3-6 oclock             - large HST at 0500 and 2 small tears at 300 within the detached retina             - s/p SBP + PPV/PFC/EL/FAX/14% C3F8 OS, 10.07.19 - progressive fibrosis/PVR just posterior to buckle around 0400 with +SRF tracking posterior to buckle -- focal tractional/PVR detachment OS             - s/p [id number] silicon oil OS, [id number]  - s/p [id number] PPV with silicon oil removal ([id number])  - retina attached and in good position -- good buckle height and laser over buckle and around retinotomy             - IOP okay at 18 today  - BCVA 20/80 as discussed above   3. Nodular scleritis OS -- recurrent, but currently quiescent  - history of recurrent episodes following the tapering of meds (PO pred and topical PF/Prolensa)  - today (4.4.23) -- interval redevelopment of focal nodular scleritis in ST quad             - limited lab work to rule out rheumatologic  cause--all results negative   CBC, CMP   HIV   HLA Panel   ANA   ANCA   ACE, Lysozyme   RF   ESR, CRP  - today, inflammatory nodule stably improved, but there is some injection more posterior to where nodule was in ST quad  4. History of retinal detachment OD  - S/P SBP OD 11/26/14 -- Dr. Tempie Hoist  - looks great -- retina in great position  - monitor  5. Pseudophakia OU  - s/p CE/IOL OU (Bev[place])  - doing well  - monitor  6. Dry eyes OU - recommend lubricating ointment at night along with artificial tears     Ophthalmic Meds Ordered this visit:  Meds ordered this encounter  Medications   Bromfenac Sodium (PROLENSA) 0.07 % SOLN    Sig: Place 1 drop into the left eye 3 (three) times daily.    Dispense:  6 mL    Refill:  3   prednisoLONE acetate (PRED FORTE) 1 % ophthalmic suspension    Sig: Place 1 drop into the left eye 3 (three) times daily.    Dispense:  15 mL    Refill:  0     Return in about 3 months (around 05/07/2023) for f/u CME OS, DFE, OCT.  There are no Patient Instructions on file for this visit.  This document serves as a record of services personally performed by Gardiner Sleeper, MD, PhD. It was created on their behalf by Joetta Manners COT, an ophthalmic technician. The creation of this record is the provider's dictation and/or activities during the visit.    Electronically signed by: Joetta Manners COT 02/03/2023 12:32 PM  This document serves as a record of services personally performed by Gardiner Sleeper, MD, PhD. It was created on their behalf by San Jetty. Owens Shark, OA an ophthalmic technician. The creation of this record is the provider's dictation and/or activities during the visit.    Electronically signed by: San Jetty. Marguerita Merles 03.22.2024 12:32 PM  Gardiner Sleeper, M.D., Ph.D. Diseases & Surgery of the Retina and [place] 02/04/2023   I have reviewed  the above documentation for accuracy and  completeness, and I agree with the above. Gardiner Sleeper, M.D., Ph.D. 02/04/23 12:36 PM   Abbreviations: M myopia (nearsighted); A astigmatism; H hyperopia (farsighted); P presbyopia; Mrx spectacle prescription;  CTL contact lenses; OD right eye; OS left eye; OU both eyes  XT exotropia; ET esotropia; PEK punctate epithelial keratitis; PEE punctate epithelial erosions; DES dry eye syndrome; MGD meibomian gland dysfunction; ATs artificial tears; PFAT's preservative free artificial tears; [place] nuclear sclerotic cataract; PSC posterior subcapsular cataract; ERM epi-retinal membrane; PVD posterior vitreous detachment; RD retinal detachment; DM diabetes mellitus; DR diabetic retinopathy; NPDR non-proliferative diabetic retinopathy; PDR proliferative diabetic retinopathy; CSME clinically significant macular edema; DME diabetic macular edema; dbh dot blot hemorrhages; CWS cotton wool spot; POAG primary open angle glaucoma; C/D cup-to-disc ratio; HVF humphrey visual field; GVF goldmann visual field; OCT optical coherence tomography; IOP intraocular pressure; BRVO Branch retinal vein occlusion; CRVO central retinal vein occlusion; CRAO central retinal artery occlusion; BRAO branch retinal artery occlusion; RT retinal tear; SB scleral buckle; PPV pars plana vitrectomy; VH Vitreous hemorrhage; PRP panretinal laser photocoagulation; IVK intravitreal kenalog; VMT vitreomacular traction; MH Macular hole;  NVD neovascularization of the disc; NVE neovascularization elsewhere; AREDS age related eye disease study; ARMD age related macular degeneration; POAG primary open angle glaucoma; EBMD epithelial/anterior basement membrane dystrophy; ACIOL anterior chamber intraocular lens; IOL intraocular lens; PCIOL posterior chamber intraocular lens; Phaco/IOL phacoemulsification with intraocular lens placement; [place] photorefractive keratectomy; LASIK laser assisted in situ keratomileusis; HTN hypertension; DM diabetes mellitus; COPD  chronic obstructive pulmonary disease

## 2023-02-04 ENCOUNTER — Ambulatory Visit (INDEPENDENT_AMBULATORY_CARE_PROVIDER_SITE_OTHER): Payer: Medicare Other | Admitting: Ophthalmology

## 2023-02-04 ENCOUNTER — Encounter (INDEPENDENT_AMBULATORY_CARE_PROVIDER_SITE_OTHER): Payer: Self-pay | Admitting: Ophthalmology

## 2023-02-04 DIAGNOSIS — Z8669 Personal history of other diseases of the nervous system and sense organs: Secondary | ICD-10-CM | POA: Diagnosis not present

## 2023-02-04 DIAGNOSIS — H35352 Cystoid macular degeneration, left eye: Secondary | ICD-10-CM

## 2023-02-04 DIAGNOSIS — H3322 Serous retinal detachment, left eye: Secondary | ICD-10-CM

## 2023-02-04 DIAGNOSIS — Z961 Presence of intraocular lens: Secondary | ICD-10-CM

## 2023-02-04 DIAGNOSIS — H15092 Other scleritis, left eye: Secondary | ICD-10-CM | POA: Diagnosis not present

## 2023-02-04 DIAGNOSIS — H04123 Dry eye syndrome of bilateral lacrimal glands: Secondary | ICD-10-CM

## 2023-02-04 MED ORDER — PREDNISOLONE ACETATE 1 % OP SUSP: 1.0000 [drp] | Freq: Three times a day (TID) | OPHTHALMIC | 0 refills | Status: DC

## 2023-02-04 MED ORDER — BROMFENAC SODIUM 0.07 % OP SOLN: 1.0000 [drp] | Freq: Three times a day (TID) | OPHTHALMIC | 3 refills | Status: DC

## 2023-02-07 ENCOUNTER — Ambulatory Visit: Payer: Medicare Other

## 2023-02-07 DIAGNOSIS — R2681 Unsteadiness on feet: Secondary | ICD-10-CM

## 2023-02-07 DIAGNOSIS — M6281 Muscle weakness (generalized): Secondary | ICD-10-CM

## 2023-02-07 DIAGNOSIS — M5459 Other low back pain: Secondary | ICD-10-CM

## 2023-02-07 NOTE — Therapy (Unsigned)
OUTPATIENT PHYSICAL THERAPY NEURO TREATMENT NOTE   Patient Name: Carla May MRN: [id number] DOB:Mar 24, 1941, 82 y.o., female Today's Date: 02/08/2023   PCP: Mckinley Jewel, MD  REFERRING PROVIDER:  Charlett Blake, MD     END OF SESSION:  PT End of Session - 02/07/23 1634     Visit Number 4    Number of Visits 25    Date for PT Re-Evaluation 04/20/23    PT Start Time 1432    PT Stop Time 1514    PT Time Calculation (min) 42 min    Equipment Utilized During Treatment Gait belt    Activity Tolerance Patient tolerated treatment well;Patient limited by pain    Behavior During Therapy WFL for tasks assessed/performed               Past Medical History:  Diagnosis Date   Anxiety    Arthritis    back. neck   Diabetes mellitus without complication (HCC)    Dysrhythmia    GERD (gastroesophageal reflux disease)    History of palpitations    HOH (hard of hearing) 06/28/2019   Hyperlipidemia    diet controlled and fish oil   MVP (mitral valve prolapse)    never has caused any problems per patient on 06/27/19   PONV (postoperative nausea and vomiting)    No problems in 08/2018; however has had previous problems   Pre-diabetes    diet controlled, no med   Retinal detachment    OU   SVD (spontaneous vaginal delivery)    x 2   Past Surgical History:  Procedure Laterality Date   ABDOMINAL HYSTERECTOMY  1970's   AIR/FLUID EXCHANGE Left 06/28/2019   Procedure: Air/Fluid Exchange;  Surgeon: Bernarda Caffey, MD;  Location: [place];  Service: Ophthalmology;  Laterality: Left;   CATARACT EXTRACTION Bilateral    Dr. Talbert Forest   COLONOSCOPY     polyp   EYE SURGERY Bilateral    Cat Sx and RD repair   GAS INSERTION Right 11/26/2014   Procedure: INSERTION OF GAS;  Surgeon: Hayden Pedro, MD;  Location: [place];  Service: Ophthalmology;  Laterality: Right;  C3F8   GAS/FLUID EXCHANGE Left 08/21/2018   Procedure: GAS/FLUID EXCHANGE;  Surgeon: Bernarda Caffey, MD;  Location: [place];  Service: Ophthalmology;  Laterality: Left;  C3F8   KNEE ARTHROSCOPY Left    LASER PHOTO ABLATION Left 06/28/2019   Procedure: Laser Photo Ablation;  Surgeon: Bernarda Caffey, MD;  Location: [place];  Service: Ophthalmology;  Laterality: Left;   PARS PLANA VITRECTOMY Left 06/28/2019   Procedure: PARS PLANA VITRECTOMY WITH 25 GAUGE;  Surgeon: Bernarda Caffey, MD;  Location: [place];  Service: Ophthalmology;  Laterality: Left;   PHOTOCOAGULATION WITH LASER Right 11/26/2014   Procedure: PHOTOCOAGULATION WITH LASER;  Surgeon: Hayden Pedro, MD;  Location: [place];  Service: Ophthalmology;  Laterality: Right;   PHOTOCOAGULATION WITH LASER Left 08/21/2018   Procedure: PHOTOCOAGULATION WITH LASER;  Surgeon: Bernarda Caffey, MD;  Location: [place];  Service: Ophthalmology;  Laterality: Left;   REPAIR OF COMPLEX TRACTION RETINAL DETACHMENT Left 12/06/2018   Procedure: REPAIR OF COMPLEX TRACTION RETINAL DETACHMENT, 25 gauge vitrectomy, endolaser photocoagulation, memebrane peel, perfluoron injection,  and silicone oil;  Surgeon: Bernarda Caffey, MD;  Location: [place];  Service: Ophthalmology;  Laterality: Left;   RETINAL DETACHMENT SURGERY Bilateral    SBP OD - Dr. Tempie Hoist (1.12.16).  SBP - Dr. Bernarda Caffey (10.7.19)   SCLERAL BUCKLE Right 11/26/2014  Procedure: SCLERAL BUCKLE RIGHT EYE ;  Surgeon: Hayden Pedro, MD;  Location: [place];  Service: Ophthalmology;  Laterality: Right;   SCLERAL BUCKLE WITH POSSIBLE 25 GAUGE PARS PLANA VITRECTOMY Left 08/21/2018   Procedure: SCLERAL BUCKLE WITH 25 GAUGE PARS PLANA VITRECTOMY;  Surgeon: Bernarda Caffey, MD;  Location: [place];  Service: Ophthalmology;  Laterality: Left;   SILICON OIL REMOVAL Left [id number]   Procedure: Silicon Oil Removal;  Surgeon: Bernarda Caffey, MD;  Location: [place];  Service: Ophthalmology;  Laterality: Left;   TONSILLECTOMY     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   Procedure: TOTAL KNEE ARTHROPLASTY;  Surgeon: Gaynelle Arabian, MD;  Location: [facility];   Service: Orthopedics;  Laterality: Left;   Patient Active Problem List   Diagnosis Date Noted   Hereditary and idiopathic peripheral neuropathy 11/29/2022   Numbness and tingling of foot 11/29/2022   Chronic bilateral low back pain with right-sided sciatica 09/06/2022   OA (osteoarthritis) of knee 05/03/2022   Osteoarthritis of left knee 05/03/2022   HOH (hard of hearing) 06/28/2019   Rhegmatogenous retinal detachment of right eye 11/26/2014    ONSET DATE: 2021, years ago  REFERRING DIAG: M48.062 (ICD-10-CM) - Spinal stenosis of lumbar region with neurogenic claudication   THERAPY DIAG:  Other low back pain  Muscle weakness (generalized)  Unsteadiness on feet  Rationale for Evaluation and Treatment: Rehabilitation  SUBJECTIVE:                                                                                                                                                                                             SUBJECTIVE STATEMENT: Pt rates pain 2-3/10 currently, but was high this morning when she first got up. She reports no stumbles/falls.    Pt accompanied by: self  PERTINENT HISTORY:  The pt is a pleasant 82 yo female referred to PT for lumbar spinal stenosis with neurogenic claudication. Pt describes chronic low back pain with numbness of LLE. Pt reports she has had back pain for years. Her pain is more prominent in her RLE than LLE. She feels pain into her toes,describes it as a numbness, tightness. Pt trying PT first before pursuing injection for her back. She reports back pain is 10+/10. Pt takes Tylenol to manage pain. Currently LBP is impacting her ability to walk, which is limited to 5 minutes. She has difficulty lifting and performing transfers.. She reports poor control of RLE which makes it difficult to get dressed. Pt reports decreased balance. She stumbles when she stands up and has difficulty walking on uneven surfaces. She also feels out of breath easily. PMH  includes  anxiety, arthritis, DM without complication, dysrhythmia, GERD, hx of palpitations, HOH, HLD, MVP, retinal detachment, hx total knee arthroplasty L 05/03/2022  PAIN:  Are you having pain? Yes: NPRS scale: 10+/10 Pain location: LBP, BLE pain (L worse than R) Pain description: tightness, numbness Aggravating factors: movement Relieving factors: Tylenol  PRECAUTIONS: Fall  WEIGHT BEARING RESTRICTIONS: No  FALLS: Has patient fallen in last 6 months? No  LIVING ENVIRONMENT: Lives with: lives with their spouseSingle point cane, Walker - 2 wheeled, and shower chair Lives in: House/apartment Stairs: Yes: External: 2-3 steps; none Has following equipment at home:   PLOF: Independent  PATIENT GOALS: Pt would like to improve RLE flexibility and control, reducing pain, improving walking  OBJECTIVE:   DIAGNOSTIC FINDINGS:   MR lumbar spine 02/27/20 via chart Narrative & Impression  CLINICAL DATA:  Sciatica, unspecified laterality. Sciatica of left side. Additional history provided by technologist: Patient reports low back pain, worse on the left side, cramps and numbness in left foot, symptoms for 2 months.   EXAM: MRI LUMBAR SPINE WITHOUT CONTRAST   TECHNIQUE: Multiplanar, multisequence MR imaging of the lumbar spine was performed. No intravenous contrast was administered.   COMPARISON:  No pertinent prior studies available for comparison.   FINDINGS: Segmentation: For the purposes of this dictation, five lumbar vertebrae are assumed and the caudal most well-formed intervertebral disc is designated L5-S1.   Alignment: Straightening of the expected lumbar lordosis. No significant spondylolisthesis   Vertebrae: Vertebral body height is maintained. Multilevel degenerative endplate irregularity. Trace degenerative endplate edema at the 624THL, L4-L5 and L5-S1 levels   Conus medullaris and cauda equina: Conus extends to the L1 level. No signal abnormality within the  visualized distal spinal cord.   Paraspinal and other soft tissues: No abnormality identified within included portions of the abdomen/retroperitoneum. Atrophy of the lumbar paraspinal musculature.   Disc levels:   Moderate/advanced disc degeneration at the L2-L3 through L5-S1 levels. Mild disc degeneration at L1-L2.   T12-L1: No disc herniation. No significant canal or foraminal stenosis.   L1-L2: Mild facet arthrosis. No disc herniation. No significant canal or foraminal stenosis.   L2-L3: Disc bulge with circumferential osteophyte ridge. Disc osteophyte ridge is more focally prominent within the left subarticular zone. Mild facet arthrosis/ligamentum flavum hypertrophy. Moderate left subarticular narrowing with crowding of the descending left L3 nerve root (series 5, image 15). Central canal patent. No significant foraminal stenosis.   L3-L4: Disc bulge with endplate spurring. Moderate facet arthrosis/ligamentum flavum hypertrophy (greater on the left). Mild left subarticular narrowing without nerve root impingement. Central canal patent. Mild bilateral neural foraminal narrowing.   L4-L5: Disc bulge with endplate spurring. Superimposed shallow right center/subarticular disc protrusion. Mild facet arthrosis/ligamentum flavum hypertrophy. Moderate right subarticular narrowing with contact upon the descending right L5 nerve root. Central canal patent. Mild right neural foraminal narrowing.   L5-S1: Disc bulge with endplate spurring. Superimposed broad-based right foraminal disc protrusion. Moderate facet arthrosis/ligamentum flavum hypertrophy. Mild bilateral subarticular narrowing (greater on the left) without frank nerve root impingement. Central canal patent. Bilateral neural foraminal narrowing (moderate right, mild left).   IMPRESSION: Lumbar spondylosis as outlined and most notably as follows.   Moderate/advanced disc degeneration at the L2-L3 through  L5-S1 levels.   At L2-L3, disc osteophyte ridge contributes to multifactorial moderate left subarticular narrowing with crowding of the descending left L3 nerve root.   At L4-L5, a shallow right center/subarticular disc protrusion contributes to moderate right subarticular narrowing with contact upon the descending right L5 nerve  root. Mild multifactorial right neural foraminal narrowing also present at this level.   At L5-S1, a broad-based disc protrusion contributes to multifactorial moderate right neural foraminal narrowing. Mild bilateral subarticular and left neural foraminal narrowing also present at this level.   Additional sites of mild subarticular and neural foraminal narrowing as described".      COGNITION: Overall cognitive status: Within functional limits for tasks assessed   SENSATION: In tact to light touch BLE wit testing, but does report numbness felt in LLE  COORDINATION:  WNL BLE   EDEMA:  Pt reports mild swelling in BLE (reports doctor aware)   MUSCLE LENGTH: Hamstring length: LLE lacking 17 deg RLE lacking 15 deg   POSTURE: abnormal, noted particularly with standing/gait, pt with R trunk lean,  LOWER EXTREMITY MMT:     BLE musculature generally 4+/5 with the following exceptions:  hip flexors 4-/5 bilat, hip er/ir each 4+/5 B      LUMBAR ROM:   AROM eval  Flexion 80%*  Extension WFL, however painful  Right lateral flexion 90%*  Left lateral flexion 70%*  Right rotation 80%*  Left rotation WFL   (Blank rows = not tested) *=pain limited   Palpation: Assessment performed in seated, pt found to have no tenderness with palpation throughout low back musculature  No tenderness found along T-spine over lumbar spine, however, further assessment with pt in prone needed to assess for mobility    LUMBAR SPECIAL TESTS:  deferred  TRANSFERS: Assistive device utilized: None  Sit to stand:  hands-free but painful Stand to sit: Complete  Independence Chair to chair: Complete Independence   GAIT: Gait pattern:  noted decreased trunk rotation, R lean, decrease gait speed Distance walked: 10 meters, clinic distances  Assistive device utilized: None Level of assistance: SBA  FUNCTIONAL TESTS:  5 times sit to stand: 15 sec hands free but painful in low back and bilat knees 10MWT: 0.75 m/s no AD, somewhat painful, pt with R trunk lean, decreased trunk rotation  PATIENT SURVEYS:  Modified Oswestry 40%  FOTO 44 Berg: 53/56   TODAY'S TREATMENT:                                                                                                                              DATE:  TherEx:  On mat table: LTRs with and without hands-on assist x 10 reps each side. Cuing to perform in pain-free range Sciatic nerve glides 10x each LE  Hooklye figure four stretch 60 sec each LE Glute bridge 3x12. Rates medium Knee-to-chest stretch 30 sec each LE SLR 1x15 each LE Clamshells 1x15, 1x10 each side.  Open book 10x each side  Seated thoracic ext over chair 15x Stability ball rollouts FWD/BCKWD reports feels good, reports a stretch in the middle of her low back and sides  Seated hamstring stretch 2x30 sec each LE    NMR: Standing NBOS EO 30 Standing NBOS EC 30 sec -repeated on airex EO  then EC 2x30 sec for each Tandem stance on airex 30 sec each LE  Comments: increased sway with EC and compliant surface conditions   PATIENT EDUCATION: Education details: assessment findings, goals, plan Person educated: Patient Education method: Explanation Education comprehension: verbalized understanding  HOME EXERCISE PROGRAM: Access Code: [email or web address] URL: [email or web address] Date: 02/01/2023 Prepared by: Ricard Dillon  Exercises - Supine Bridge  - 1 x daily - 5 x weekly - 3 sets - 10 reps - Supine Straight Leg Raise  - 1 x daily - 5 x weekly - 2 sets - 10 reps - Clamshell  - 1 x daily - 5 x weekly - 2 sets - 15 reps -  Seated Hamstring Stretch  - 1 x daily - 7 x weekly - 1 sets - 2 reps - 30 seconds hold  GOALS: Goals reviewed with patient? Yes     SHORT TERM GOALS: Target date: 03/09/2023   Patient will be independent in home exercise program to improve strength/mobility for better functional independence with ADLs. Baseline:to be initiated Goal status: INITIAL   LONG TERM GOALS: Target date: 04/20/2023   Patient will increase FOTO score to equal to or greater than  54 to demonstrate statistically significant improvement in mobility and quality of life.  Baseline: 44 Goal status: INITIAL  2.  Patient  will complete five times sit to stand test in < 10 seconds indicating an increased LE strength and improved balance. Baseline: 14 seconds, painful Goal status: INITIAL  3.  Patient will increase Berg Balance tandem stance and SLB score by at least 1 pt to demonstrate decreased fall risk during functional activities Baseline: to be completed next 1-2 visits; 3/19: SLB 2/4, tandem 3/4 Goal status: REVISED  4.  Patient will increase 10 meter walk test to >1.64m/s as to improve gait speed for better community ambulation and to reduce fall risk. Baseline: 0.75 m/s, painful Goal status: INITIAL  6. Patient will reduce modified Oswestry score to <20 as to demonstrate minimal disability with ADLs including improved sleeping tolerance, walking/sitting tolerance etc for better mobility with ADLs.   Baseline: 40% Goal status: INITIAL   ASSESSMENT:  CLINICAL IMPRESSION: Pt able to perform interventions within pain-free ranges in today's session. Pt even reported pain-relief with stability ball fwd/bckwd rollouts. Introduced more balance exercises, where pt able to sustain all without significant LOB but did demonstrate increased sway. The pt will benefit from further skilled PT to address these deficits in order to improve QOL, functional mobility and to decrease fall risk.   OBJECTIVE IMPAIRMENTS:  Abnormal gait, decreased activity tolerance, decreased balance, decreased endurance, decreased mobility, difficulty walking, decreased ROM, decreased strength, improper body mechanics, postural dysfunction, and pain.   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   REHAB POTENTIAL: Fair    CLINICAL DECISION MAKING: Evolving/moderate complexity  EVALUATION COMPLEXITY: Moderate  PLAN:  PT FREQUENCY: 2x/week  PT DURATION: 12 weeks  PLANNED INTERVENTIONS: Therapeutic exercises, Therapeutic activity, Neuromuscular re-education, Balance training, Gait training, Patient/Family education, Self Care, Joint mobilization, Joint manipulation, Stair training, Vestibular training, Canalith repositioning, Orthotic/Fit training, DME instructions, Dry Needling, Electrical stimulation, Spinal manipulation, Spinal mobilization, Cryotherapy, Moist heat,  Splintting, Taping, Traction, Manual therapy, and Re-evaluation  PLAN FOR NEXT SESSION: mobility, strength, balance   Zollie Pee, PT 02/08/2023, 9:07 AM

## 2023-02-09 ENCOUNTER — Ambulatory Visit: Payer: Medicare Other

## 2023-02-09 DIAGNOSIS — M5459 Other low back pain: Secondary | ICD-10-CM | POA: Diagnosis not present

## 2023-02-09 DIAGNOSIS — M6281 Muscle weakness (generalized): Secondary | ICD-10-CM

## 2023-02-09 NOTE — Therapy (Signed)
OUTPATIENT PHYSICAL THERAPY NEURO TREATMENT NOTE   Patient Name: Carla May MRN: [id number] DOB:1940/12/21, 82 y.o., female Today's Date: 02/09/2023   PCP: Mckinley Jewel, MD  REFERRING PROVIDER:  Charlett Blake, MD     END OF SESSION:  PT End of Session - 02/09/23 1358     Visit Number 5    Number of Visits 25    Date for PT Re-Evaluation 04/20/23    PT Start Time 1151    PT Stop Time 1230    PT Time Calculation (min) 39 min    Equipment Utilized During Treatment Gait belt    Activity Tolerance Patient tolerated treatment well    Behavior During Therapy WFL for tasks assessed/performed               Past Medical History:  Diagnosis Date   Anxiety    Arthritis    back. neck   Diabetes mellitus without complication (HCC)    Dysrhythmia    GERD (gastroesophageal reflux disease)    History of palpitations    HOH (hard of hearing) 06/28/2019   Hyperlipidemia    diet controlled and fish oil   MVP (mitral valve prolapse)    never has caused any problems per patient on 06/27/19   PONV (postoperative nausea and vomiting)    No problems in 08/2018; however has had previous problems   Pre-diabetes    diet controlled, no med   Retinal detachment    OU   SVD (spontaneous vaginal delivery)    x 2   Past Surgical History:  Procedure Laterality Date   ABDOMINAL HYSTERECTOMY  1970's   AIR/FLUID EXCHANGE Left 06/28/2019   Procedure: Air/Fluid Exchange;  Surgeon: Bernarda Caffey, MD;  Location: [place];  Service: Ophthalmology;  Laterality: Left;   CATARACT EXTRACTION Bilateral    Dr. Talbert Forest   COLONOSCOPY     polyp   EYE SURGERY Bilateral    Cat Sx and RD repair   GAS INSERTION Right 11/26/2014   Procedure: INSERTION OF GAS;  Surgeon: Hayden Pedro, MD;  Location: [place];  Service: Ophthalmology;  Laterality: Right;  C3F8   GAS/FLUID EXCHANGE Left 08/21/2018   Procedure: GAS/FLUID EXCHANGE;  Surgeon: Bernarda Caffey, MD;  Location: [place];  Service:  Ophthalmology;  Laterality: Left;  C3F8   KNEE ARTHROSCOPY Left    LASER PHOTO ABLATION Left 06/28/2019   Procedure: Laser Photo Ablation;  Surgeon: Bernarda Caffey, MD;  Location: [place];  Service: Ophthalmology;  Laterality: Left;   PARS PLANA VITRECTOMY Left 06/28/2019   Procedure: PARS PLANA VITRECTOMY WITH 25 GAUGE;  Surgeon: Bernarda Caffey, MD;  Location: [place];  Service: Ophthalmology;  Laterality: Left;   PHOTOCOAGULATION WITH LASER Right 11/26/2014   Procedure: PHOTOCOAGULATION WITH LASER;  Surgeon: Hayden Pedro, MD;  Location: [place];  Service: Ophthalmology;  Laterality: Right;   PHOTOCOAGULATION WITH LASER Left 08/21/2018   Procedure: PHOTOCOAGULATION WITH LASER;  Surgeon: Bernarda Caffey, MD;  Location: [place];  Service: Ophthalmology;  Laterality: Left;   REPAIR OF COMPLEX TRACTION RETINAL DETACHMENT Left 12/06/2018   Procedure: REPAIR OF COMPLEX TRACTION RETINAL DETACHMENT, 25 gauge vitrectomy, endolaser photocoagulation, memebrane peel, perfluoron injection,  and silicone oil;  Surgeon: Bernarda Caffey, MD;  Location: [place];  Service: Ophthalmology;  Laterality: Left;   RETINAL DETACHMENT SURGERY Bilateral    SBP OD - Dr. Tempie Hoist (1.12.16).  SBP - Dr. Bernarda Caffey (10.7.19)   SCLERAL BUCKLE Right 11/26/2014   Procedure: SCLERAL BUCKLE  RIGHT EYE ;  Surgeon: Hayden Pedro, MD;  Location: [place];  Service: Ophthalmology;  Laterality: Right;   SCLERAL BUCKLE WITH POSSIBLE 25 GAUGE PARS PLANA VITRECTOMY Left 08/21/2018   Procedure: SCLERAL BUCKLE WITH 25 GAUGE PARS PLANA VITRECTOMY;  Surgeon: Bernarda Caffey, MD;  Location: [place];  Service: Ophthalmology;  Laterality: Left;   SILICON OIL REMOVAL Left [id number]   Procedure: Silicon Oil Removal;  Surgeon: Bernarda Caffey, MD;  Location: [place];  Service: Ophthalmology;  Laterality: Left;   TONSILLECTOMY     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   Procedure: TOTAL KNEE ARTHROPLASTY;  Surgeon: Gaynelle Arabian, MD;  Location: [facility];  Service:  Orthopedics;  Laterality: Left;   Patient Active Problem List   Diagnosis Date Noted   Hereditary and idiopathic peripheral neuropathy 11/29/2022   Numbness and tingling of foot 11/29/2022   Chronic bilateral low back pain with right-sided sciatica 09/06/2022   OA (osteoarthritis) of knee 05/03/2022   Osteoarthritis of left knee 05/03/2022   HOH (hard of hearing) 06/28/2019   Rhegmatogenous retinal detachment of right eye 11/26/2014    ONSET DATE: 2021, years ago  REFERRING DIAG: M48.062 (ICD-10-CM) - Spinal stenosis of lumbar region with neurogenic claudication   THERAPY DIAG:  Muscle weakness (generalized)  Other low back pain  Rationale for Evaluation and Treatment: Rehabilitation  SUBJECTIVE:                                                                                                                                                                                             SUBJECTIVE STATEMENT: Pain is currently 4/10. She reports no stumbles/falls, no other updates/concerns.    Pt accompanied by: self  PERTINENT HISTORY:  The pt is a pleasant 82 yo female referred to PT for lumbar spinal stenosis with neurogenic claudication. Pt describes chronic low back pain with numbness of LLE. Pt reports she has had back pain for years. Her pain is more prominent in her RLE than LLE. She feels pain into her toes,describes it as a numbness, tightness. Pt trying PT first before pursuing injection for her back. She reports back pain is 10+/10. Pt takes Tylenol to manage pain. Currently LBP is impacting her ability to walk, which is limited to 5 minutes. She has difficulty lifting and performing transfers.. She reports poor control of RLE which makes it difficult to get dressed. Pt reports decreased balance. She stumbles when she stands up and has difficulty walking on uneven surfaces. She also feels out of breath easily. 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 Yes: NPRS scale: 10+/10 Pain location: LBP, BLE pain (L worse than R) Pain description: tightness, numbness Aggravating factors: movement Relieving factors: Tylenol  PRECAUTIONS: Fall  WEIGHT BEARING RESTRICTIONS: No  FALLS: Has patient fallen in last 6 months? No  LIVING ENVIRONMENT: Lives with: lives with their spouseSingle point cane, Walker - 2 wheeled, and shower chair Lives in: House/apartment Stairs: Yes: External: 2-3 steps; none Has following equipment at home:   PLOF: Independent  PATIENT GOALS: Pt would like to improve RLE flexibility and control, reducing pain, improving walking  OBJECTIVE:   DIAGNOSTIC FINDINGS:   MR lumbar spine 02/27/20 via chart Narrative & Impression  CLINICAL DATA:  Sciatica, unspecified laterality. Sciatica of left side. Additional history provided by technologist: Patient reports low back pain, worse on the left side, cramps and numbness in left foot, symptoms for 2 months.   EXAM: MRI LUMBAR SPINE WITHOUT CONTRAST   TECHNIQUE: Multiplanar, multisequence MR imaging of the lumbar spine was performed. No intravenous contrast was administered.   COMPARISON:  No pertinent prior studies available for comparison.   FINDINGS: Segmentation: For the purposes of this dictation, five lumbar vertebrae are assumed and the caudal most well-formed intervertebral disc is designated L5-S1.   Alignment: Straightening of the expected lumbar lordosis. No significant spondylolisthesis   Vertebrae: Vertebral body height is maintained. Multilevel degenerative endplate irregularity. Trace degenerative endplate edema at the 624THL, L4-L5 and L5-S1 levels   Conus medullaris and cauda equina: Conus extends to the L1 level. No signal abnormality within the visualized distal spinal cord.   Paraspinal and other  soft tissues: No abnormality identified within included portions of the abdomen/retroperitoneum. Atrophy of the lumbar paraspinal musculature.   Disc levels:   Moderate/advanced disc degeneration at the L2-L3 through L5-S1 levels. Mild disc degeneration at L1-L2.   T12-L1: No disc herniation. No significant canal or foraminal stenosis.   L1-L2: Mild facet arthrosis. No disc herniation. No significant canal or foraminal stenosis.   L2-L3: Disc bulge with circumferential osteophyte ridge. Disc osteophyte ridge is more focally prominent within the left subarticular zone. Mild facet arthrosis/ligamentum flavum hypertrophy. Moderate left subarticular narrowing with crowding of the descending left L3 nerve root (series 5, image 15). Central canal patent. No significant foraminal stenosis.   L3-L4: Disc bulge with endplate spurring. Moderate facet arthrosis/ligamentum flavum hypertrophy (greater on the left). Mild left subarticular narrowing without nerve root impingement. Central canal patent. Mild bilateral neural foraminal narrowing.   L4-L5: Disc bulge with endplate spurring. Superimposed shallow right center/subarticular disc protrusion. Mild facet arthrosis/ligamentum flavum hypertrophy. Moderate right subarticular narrowing with contact upon the descending right L5 nerve root. Central canal patent. Mild right neural foraminal narrowing.   L5-S1: Disc bulge with endplate spurring. Superimposed broad-based right foraminal disc protrusion. Moderate facet arthrosis/ligamentum flavum hypertrophy. Mild bilateral subarticular narrowing (greater on the left) without frank nerve root impingement. Central canal patent. Bilateral neural foraminal narrowing (moderate right, mild left).   IMPRESSION: Lumbar spondylosis as outlined and most notably as follows.   Moderate/advanced disc degeneration at the L2-L3 through L5-S1 levels.   At L2-L3, disc osteophyte ridge contributes to  multifactorial moderate left subarticular narrowing with crowding of the descending left L3 nerve root.   At L4-L5, a shallow right center/subarticular disc protrusion contributes to moderate right subarticular narrowing with contact upon the descending right L5 nerve root. Mild multifactorial right neural foraminal narrowing also present at this level.   At  L5-S1, a broad-based disc protrusion contributes to multifactorial moderate right neural foraminal narrowing. Mild bilateral subarticular and left neural foraminal narrowing also present at this level.   Additional sites of mild subarticular and neural foraminal narrowing as described".      COGNITION: Overall cognitive status: Within functional limits for tasks assessed   SENSATION: In tact to light touch BLE wit testing, but does report numbness felt in LLE  COORDINATION:  WNL BLE   EDEMA:  Pt reports mild swelling in BLE (reports doctor aware)   MUSCLE LENGTH: Hamstring length: LLE lacking 17 deg RLE lacking 15 deg   POSTURE: abnormal, noted particularly with standing/gait, pt with R trunk lean,  LOWER EXTREMITY MMT:     BLE musculature generally 4+/5 with the following exceptions:  hip flexors 4-/5 bilat, hip er/ir each 4+/5 B      LUMBAR ROM:   AROM eval  Flexion 80%*  Extension WFL, however painful  Right lateral flexion 90%*  Left lateral flexion 70%*  Right rotation 80%*  Left rotation WFL   (Blank rows = not tested) *=pain limited   Palpation: Assessment performed in seated, pt found to have no tenderness with palpation throughout low back musculature  No tenderness found along T-spine over lumbar spine, however, further assessment with pt in prone needed to assess for mobility    LUMBAR SPECIAL TESTS:  deferred  TRANSFERS: Assistive device utilized: None  Sit to stand:  hands-free but painful Stand to sit: Complete Independence Chair to chair: Complete Independence   GAIT: Gait  pattern:  noted decreased trunk rotation, R lean, decrease gait speed Distance walked: 10 meters, clinic distances  Assistive device utilized: None Level of assistance: SBA  FUNCTIONAL TESTS:  5 times sit to stand: 15 sec hands free but painful in low back and bilat knees 10MWT: 0.75 m/s no AD, somewhat painful, pt with R trunk lean, decreased trunk rotation  PATIENT SURVEYS:  Modified Oswestry 40%  FOTO 44 Berg: 53/56   TODAY'S TREATMENT:                                                                                                                              DATE:  Heat donned to low back while pt performs therex. Pt reports nothing on low back prohibiting safe use of heat. Pt reports heat feels good, skin checked and is WNL, no adverse reaction to heat.   TherEx:  On mat table: LTRs 1x 10 reps each side.  Knee to chest stretch 2x30 sec each LE  Green p.ball hamstring curls 20x Bridge 15x, 12x LTRs 5x each side P.ball adductor squeezes 2x10x with 5 sec hold/rep Sciatic nerve glides 12x each LE  SLR 12x each LE  Clamshells 1x15 each LE SLR 5x each LE pain free today Open book 15x each side- Cuing for pain free positioning with RUE (pt with hx of R shoulder pain)  Seated trunk twists 10x each side Seated thoracic  ext over chair 15x  Stability ball rollouts FWD/BCKWD and LTL 5 min. Cuing for modified UE positioning to improve R shoulder comfort  Seated hamstring stretch 2x30 sec each LE  Seated figure-four stretch 2x30 sec each LE  STS 8x. Fatiguing   PATIENT EDUCATION: Education details: assessment findings, goals, plan Person educated: Patient Education method: Explanation Education comprehension: verbalized understanding  HOME EXERCISE PROGRAM: Access Code: [email or web address] URL: [email or web address] Date: 02/01/2023 Prepared by: Ricard Dillon  Exercises - Supine Bridge  - 1 x daily - 5 x weekly - 3 sets - 10 reps - Supine Straight Leg Raise  - 1 x  daily - 5 x weekly - 2 sets - 10 reps - Clamshell  - 1 x daily - 5 x weekly - 2 sets - 15 reps - Seated Hamstring Stretch  - 1 x daily - 7 x weekly - 1 sets - 2 reps - 30 seconds hold  GOALS: Goals reviewed with patient? Yes     SHORT TERM GOALS: Target date: 03/09/2023   Patient will be independent in home exercise program to improve strength/mobility for better functional independence with ADLs. Baseline:to be initiated Goal status: INITIAL   LONG TERM GOALS: Target date: 04/20/2023   Patient will increase FOTO score to equal to or greater than  54 to demonstrate statistically significant improvement in mobility and quality of life.  Baseline: 44 Goal status: INITIAL  2.  Patient  will complete five times sit to stand test in < 10 seconds indicating an increased LE strength and improved balance. Baseline: 14 seconds, painful Goal status: INITIAL  3.  Patient will increase Berg Balance tandem stance and SLB score by at least 1 pt to demonstrate decreased fall risk during functional activities Baseline: to be completed next 1-2 visits; 3/19: SLB 2/4, tandem 3/4 Goal status: REVISED  4.  Patient will increase 10 meter walk test to >1.56m/s as to improve gait speed for better community ambulation and to reduce fall risk. Baseline: 0.75 m/s, painful Goal status: INITIAL  6. Patient will reduce modified Oswestry score to <20 as to demonstrate minimal disability with ADLs including improved sleeping tolerance, walking/sitting tolerance etc for better mobility with ADLs.   Baseline: 40% Goal status: INITIAL   ASSESSMENT:  CLINICAL IMPRESSION: Pt shows progress by performing side-lye hip abduction without pain today, this intervention was previously pain limited. While pt shows progress, she still fatigued quickly with more advanced LE therex. The pt will benefit from further skilled PT to address these deficits in order to improve QOL, functional mobility and to decrease fall  risk.   OBJECTIVE IMPAIRMENTS: Abnormal gait, decreased activity tolerance, decreased balance, decreased endurance, decreased mobility, difficulty walking, decreased ROM, decreased strength, improper body mechanics, postural dysfunction, and pain.   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   REHAB POTENTIAL: Fair    CLINICAL DECISION MAKING: Evolving/moderate complexity  EVALUATION COMPLEXITY: Moderate  PLAN:  PT FREQUENCY: 2x/week  PT DURATION: 12 weeks  PLANNED INTERVENTIONS: Therapeutic exercises, Therapeutic activity, Neuromuscular re-education, Balance training, Gait training, Patient/Family education, Self Care, Joint mobilization, Joint manipulation, Stair training, Vestibular training, Canalith repositioning, Orthotic/Fit training, DME instructions,  Dry Needling, Electrical stimulation, Spinal manipulation, Spinal mobilization, Cryotherapy, Moist heat, Splintting, Taping, Traction, Manual therapy, and Re-evaluation  PLAN FOR NEXT SESSION: mobility, strength, balance   Zollie Pee, PT 02/09/2023, 2:01 PM

## 2023-02-09 NOTE — Progress Notes (Unsigned)
Subjective:    Patient ID: Carla May, female    DOB: 27-Mar-1941, 82 y.o.   MRN: [id number]  HPI   Carla May is a 82 y.o. year old female  who  has a past medical history of Anxiety, Arthritis, Diabetes mellitus without complication ([place]), Dysrhythmia, GERD (gastroesophageal reflux disease), History of palpitations, HOH (hard of hearing) (06/28/2019), Hyperlipidemia, MVP (mitral valve prolapse), PONV (postoperative nausea and vomiting), Pre-diabetes, Retinal detachment, and SVD (spontaneous vaginal delivery).   They are presenting to PM&R clinic for follow up related to low back and nerve pain .  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    Referral placed to Dr. Letta Pate for R L5-S1 ESI, given imaging above and sensory loss/"gritty" and bothersome sensations in R shin and plantar foot   Back pain d/t arthropathy well controlled on Tylenol 650 mg TID PRN; continue current regimen.     Numbness and tingling of foot Assessment & Plan: Likely multifactorial given findings, L5-S1 radiculopathy vs. Tarsal tunnel vs. Age related idiopathic peripheral neuropathy; all 3 are supported by current exam.    Call clinic 2 weeks after ESI to report effects; if effective, can follow with Dr. Letta Pate for injections and myself PRN. If ineffective, will bring back to clinic for EMG to evaluate for tarsal tunnel.      Interval Hx:  - Therapies: Going to PT for her back but feels it is not beneficial for her pain. She says her strength is improving but her pain is untouched. She is wondering if she can stop. It is not worsening her symptoms.   She does some pot gardening at home; does not do much yardwork due ot  her back pain.    - Follow ups: Optho as below   - Falls: none   - [id number]   - Medications: Tylenol still working well for her; it takes the edge off her pain, she takes 2-3 per day. Pain is usually 8-9/10 in the AM, 4-5/10 after medication.   Did not have side effects from gabapentin, ?palpitations around that time. Took at bedtime, when her back pain is worse. Did not notice a difference at that time but has had in crease numbness/tingkling pain in her feet and worsening back pain since then.    - Other concerns: She is having issues with her eyes recently; has an appointment today for her glasses.  No issues driving or falling with decreased vision.   Pain Inventory Average Pain 8 Pain Right Now 3 My pain is sharp and aching  In the last 24 hours, has pain interfered with the following? General activity 5 Relation with others 0 Enjoyment of life 5 What TIME of day is your pain at its worst? morning  Sleep (in general) Fair  Pain is worse with: bending and some activites Pain improves with: heat/ice and medication Relief from Meds: 8  Family History  Problem Relation Age of Onset   Cancer Mother    Heart failure Father    Social History   Socioeconomic History   Marital status: Divorced    Spouse name: Not on file   Number of children: Not on file   Years of education: Not on file   Highest education level: Not on  file  Occupational History   Not on file  Tobacco Use   Smoking status: Former    Packs/day: 0.50    Years: 10.00    Additional pack years: 0.00    Total pack years: 5.00    Types: Cigarettes    Quit date: 1970    Years since quitting: 54.2   Smokeless tobacco: Never   Tobacco comments:    quit in 1970's  Vaping Use   Vaping Use: Never used  Substance and Sexual Activity   Alcohol use: No   Drug use: No   Sexual activity: Not on file    Comment: Hysterectomy  Other Topics Concern   Not on file  Social History Narrative   Not on file    Social Determinants of Health   Financial Resource Strain: Not on file  Food Insecurity: Not on file  Transportation Needs: Not on file  Physical Activity: Not on file  Stress: Not on file  Social Connections: Not on file   Past Surgical History:  Procedure Laterality Date   ABDOMINAL HYSTERECTOMY  1970's   AIR/FLUID EXCHANGE Left 06/28/2019   Procedure: Air/Fluid Exchange;  Surgeon: Bernarda Caffey, MD;  Location: [place];  Service: Ophthalmology;  Laterality: Left;   CATARACT EXTRACTION Bilateral    Dr. Talbert Forest   COLONOSCOPY     polyp   EYE SURGERY Bilateral    Cat Sx and RD repair   GAS INSERTION Right 11/26/2014   Procedure: INSERTION OF GAS;  Surgeon: Hayden Pedro, MD;  Location: [place];  Service: Ophthalmology;  Laterality: Right;  C3F8   GAS/FLUID EXCHANGE Left 08/21/2018   Procedure: GAS/FLUID EXCHANGE;  Surgeon: Bernarda Caffey, MD;  Location: [place];  Service: Ophthalmology;  Laterality: Left;  C3F8   KNEE ARTHROSCOPY Left    LASER PHOTO ABLATION Left 06/28/2019   Procedure: Laser Photo Ablation;  Surgeon: Bernarda Caffey, MD;  Location: [place];  Service: Ophthalmology;  Laterality: Left;   PARS PLANA VITRECTOMY Left 06/28/2019   Procedure: PARS PLANA VITRECTOMY WITH 25 GAUGE;  Surgeon: Bernarda Caffey, MD;  Location: [place];  Service: Ophthalmology;  Laterality: Left;   PHOTOCOAGULATION WITH LASER Right 11/26/2014   Procedure: PHOTOCOAGULATION WITH LASER;  Surgeon: Hayden Pedro, MD;  Location: [place];  Service: Ophthalmology;  Laterality: Right;   PHOTOCOAGULATION WITH LASER Left 08/21/2018   Procedure: PHOTOCOAGULATION WITH LASER;  Surgeon: Bernarda Caffey, MD;  Location: [place];  Service: Ophthalmology;  Laterality: Left;   REPAIR OF COMPLEX TRACTION RETINAL DETACHMENT Left 12/06/2018   Procedure: REPAIR OF COMPLEX TRACTION RETINAL DETACHMENT, 25 gauge vitrectomy, endolaser photocoagulation, memebrane peel, perfluoron injection,  and silicone oil;  Surgeon: Bernarda Caffey, MD;  Location:  [place];  Service: Ophthalmology;  Laterality: Left;   RETINAL DETACHMENT SURGERY Bilateral    SBP OD - Dr. Tempie Hoist (1.12.16).  SBP - Dr. Bernarda Caffey (10.7.19)   SCLERAL BUCKLE Right 11/26/2014   Procedure: SCLERAL BUCKLE RIGHT EYE ;  Surgeon: Hayden Pedro, MD;  Location: [place];  Service: Ophthalmology;  Laterality: Right;   SCLERAL BUCKLE WITH POSSIBLE 25 GAUGE PARS PLANA VITRECTOMY Left 08/21/2018   Procedure: SCLERAL BUCKLE WITH 25 GAUGE PARS PLANA VITRECTOMY;  Surgeon: Bernarda Caffey, MD;  Location: [place];  Service: Ophthalmology;  Laterality: Left;   SILICON OIL REMOVAL Left [id number]   Procedure: Silicon Oil Removal;  Surgeon: Bernarda Caffey, MD;  Location: [place];  Service: Ophthalmology;  Laterality: Left;   TONSILLECTOMY     TOTAL  KNEE ARTHROPLASTY Left 05/03/2022   Procedure: TOTAL KNEE ARTHROPLASTY;  Surgeon: Gaynelle Arabian, MD;  Location: [facility];  Service: Orthopedics;  Laterality: Left;   Past Surgical History:  Procedure Laterality Date   ABDOMINAL HYSTERECTOMY  1970's   AIR/FLUID EXCHANGE Left 06/28/2019   Procedure: Air/Fluid Exchange;  Surgeon: Bernarda Caffey, MD;  Location: [place];  Service: Ophthalmology;  Laterality: Left;   CATARACT EXTRACTION Bilateral    Dr. Talbert Forest   COLONOSCOPY     polyp   EYE SURGERY Bilateral    Cat Sx and RD repair   GAS INSERTION Right 11/26/2014   Procedure: INSERTION OF GAS;  Surgeon: Hayden Pedro, MD;  Location: [place];  Service: Ophthalmology;  Laterality: Right;  C3F8   GAS/FLUID EXCHANGE Left 08/21/2018   Procedure: GAS/FLUID EXCHANGE;  Surgeon: Bernarda Caffey, MD;  Location: [place];  Service: Ophthalmology;  Laterality: Left;  C3F8   KNEE ARTHROSCOPY Left    LASER PHOTO ABLATION Left 06/28/2019   Procedure: Laser Photo Ablation;  Surgeon: Bernarda Caffey, MD;  Location: [place];  Service: Ophthalmology;  Laterality: Left;   PARS PLANA VITRECTOMY Left 06/28/2019   Procedure: PARS PLANA VITRECTOMY WITH 25 GAUGE;  Surgeon: Bernarda Caffey, MD;   Location: [place];  Service: Ophthalmology;  Laterality: Left;   PHOTOCOAGULATION WITH LASER Right 11/26/2014   Procedure: PHOTOCOAGULATION WITH LASER;  Surgeon: Hayden Pedro, MD;  Location: [place];  Service: Ophthalmology;  Laterality: Right;   PHOTOCOAGULATION WITH LASER Left 08/21/2018   Procedure: PHOTOCOAGULATION WITH LASER;  Surgeon: Bernarda Caffey, MD;  Location: [place];  Service: Ophthalmology;  Laterality: Left;   REPAIR OF COMPLEX TRACTION RETINAL DETACHMENT Left 12/06/2018   Procedure: REPAIR OF COMPLEX TRACTION RETINAL DETACHMENT, 25 gauge vitrectomy, endolaser photocoagulation, memebrane peel, perfluoron injection,  and silicone oil;  Surgeon: Bernarda Caffey, MD;  Location: [place];  Service: Ophthalmology;  Laterality: Left;   RETINAL DETACHMENT SURGERY Bilateral    SBP OD - Dr. Tempie Hoist (1.12.16).  SBP - Dr. Bernarda Caffey (10.7.19)   SCLERAL BUCKLE Right 11/26/2014   Procedure: SCLERAL BUCKLE RIGHT EYE ;  Surgeon: Hayden Pedro, MD;  Location: [place];  Service: Ophthalmology;  Laterality: Right;   SCLERAL BUCKLE WITH POSSIBLE 25 GAUGE PARS PLANA VITRECTOMY Left 08/21/2018   Procedure: SCLERAL BUCKLE WITH 25 GAUGE PARS PLANA VITRECTOMY;  Surgeon: Bernarda Caffey, MD;  Location: [place];  Service: Ophthalmology;  Laterality: Left;   SILICON OIL REMOVAL Left [id number]   Procedure: Silicon Oil Removal;  Surgeon: Bernarda Caffey, MD;  Location: [place];  Service: Ophthalmology;  Laterality: Left;   TONSILLECTOMY     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   Procedure: TOTAL KNEE ARTHROPLASTY;  Surgeon: Gaynelle Arabian, MD;  Location: [facility];  Service: Orthopedics;  Laterality: Left;   Past Medical History:  Diagnosis Date   Anxiety    Arthritis    back. neck   Diabetes mellitus without complication ([facility])    Dysrhythmia    GERD (gastroesophageal reflux disease)    History of palpitations    HOH (hard of hearing) 06/28/2019   Hyperlipidemia    diet controlled and fish oil   MVP (mitral valve  prolapse)    never has caused any problems per patient on 06/27/19   PONV (postoperative nausea and vomiting)    No problems in 08/2018; however has had previous problems   Pre-diabetes    diet controlled, no med   Retinal detachment    OU   SVD (  spontaneous vaginal delivery)    x 2   BP (!) 153/78   Pulse 84   Ht 5\' 5"  (1.651 m)   Wt 167 lb 9.6 oz (76 kg)   LMP  (LMP Unknown)   SpO2 95%   BMI 27.89 kg/m   Opioid Risk Score:   Fall Risk Score:  `1  Depression screen [facility] 2/9     01/11/2023   10:35 AM 09/06/2022   10:38 AM 04/08/2020   11:52 AM  Depression screen PHQ 2/9  Decreased Interest 0 0 0  Down, Depressed, Hopeless 0 0 0  PHQ - 2 Score 0 0 0  Altered sleeping  1   Tired, decreased energy  2   Change in appetite  1   Feeling bad or failure about yourself   1   Trouble concentrating  0   Moving slowly or fidgety/restless  0   Suicidal thoughts  0   PHQ-9 Score  5     Review of Systems  Musculoskeletal:  Positive for back pain.       Right lower leg pain  All other systems reviewed and are negative.     Objective:   Physical Exam   PE: Constitution: Appropriate appearance for age. No apparent distress   Resp: No respiratory distress. No accessory muscle usage. on RA Cardio: Well perfused appearance. No peripheral edema. Abdomen: Nondistended. Nontender.   Psych: Appropriate mood and affect. Neuro: AAOx4. No apparent cognitive deficits   Neurologic Exam:   DTRs: Reflexes were 2+ in bilateral achilles, patella, biceps, BR and triceps. Babinsky: flexor responses b/l.   Hoffmans: negative b/l Sensory exam: revealed normal sensation in all dermatomal regions in bilateral upper extremities   + numbness in R lateral calf - no sensory deficit on palpaiton of bilateral LE, plantar medial and lateral feet  Motor exam: strength 5/5 throughout bilateral lower extremities Coordination: Fine motor coordination was normal.   Gait: normal      Assessment &  Plan:   Carla May is a 82 y.o. year old female  who  has a past medical history of Anxiety, Arthritis, Diabetes mellitus without complication, Dysrhythmia, GERD (gastroesophageal reflux disease), History of palpitations, HOH (hard of hearing) (06/28/2019), Hyperlipidemia, MVP (mitral valve prolapse), PONV (postoperative nausea and vomiting), Pre-diabetes, Retinal detachment, and SVD (spontaneous vaginal delivery).  They are presenting to PM&R clinic for follow up related to low back and nerve pain .   Chronic bilateral low back pain with right-sided sciatica Assessment & Plan: Continue physical therapy for your back.  I recommend discussing with your therapist some mutual goals, such as gardening and planting tolerance, to help guide your therapies.  Continue Tylenol for pain control.    Follow-up with Dr. Letta Pate for epidural steroid injection as scheduled.   Numbness and tingling of foot Assessment & Plan: Likely d/t radiculopathy R L5; symptoms do not extend to plantar foot on exam  I am starting you back on gabapentin 300 mg at night to help with your back pain and nerve pain in your feet.  After 2 weeks, if no side effects from this medication and no notable effect, you can increase it to 2 capsules at night.  If you start to experience side effects, call me and we may switch you back to meloxicam or try a different nerve pain medication.  Follow-up with me in 3 months.    Other orders -     Gabapentin; Take 1 capsule (300 mg total) by  mouth at bedtime.  Dispense: 60 capsule; Refill: 5

## 2023-02-14 ENCOUNTER — Encounter
Payer: Medicare Other | Attending: Physical Medicine and Rehabilitation | Admitting: Physical Medicine and Rehabilitation

## 2023-02-14 VITALS — BP 153/78 | HR 84 | Ht 65.0 in | Wt 167.6 lb

## 2023-02-14 DIAGNOSIS — M5441 Lumbago with sciatica, right side: Secondary | ICD-10-CM | POA: Insufficient documentation

## 2023-02-14 DIAGNOSIS — R202 Paresthesia of skin: Secondary | ICD-10-CM

## 2023-02-14 DIAGNOSIS — M48062 Spinal stenosis, lumbar region with neurogenic claudication: Secondary | ICD-10-CM | POA: Diagnosis present

## 2023-02-14 DIAGNOSIS — R200 Anesthesia of skin: Secondary | ICD-10-CM

## 2023-02-14 DIAGNOSIS — G609 Hereditary and idiopathic neuropathy, unspecified: Secondary | ICD-10-CM

## 2023-02-14 DIAGNOSIS — G8929 Other chronic pain: Secondary | ICD-10-CM | POA: Diagnosis present

## 2023-02-14 MED ORDER — GABAPENTIN 300 MG PO CAPS: 300.0000 mg | ORAL_CAPSULE | Freq: Every day | ORAL | 5 refills | Status: DC

## 2023-02-14 NOTE — Patient Instructions (Signed)
Continue physical therapy for your back.  I recommend discussing with your therapist some mutual goals, such as gardening and planting tolerance, to help guide your therapies.  Follow-up with Dr. Charleen Kirks for epidural steroid injection as scheduled.  Continue Tylenol for pain control.  I am starting you back on gabapentin 300 mg at night to help with your back pain and nerve pain in your feet.  After 2 weeks, if no side effects from this medication and no notable effect, you can increase it to 2 capsules at night.  If you start to experience side effects, call me and we may switch you back to meloxicam or try a different nerve pain medication.  Follow-up with me in 3 months.

## 2023-02-16 ENCOUNTER — Telehealth: Payer: Self-pay | Admitting: Physical Medicine and Rehabilitation

## 2023-02-16 NOTE — Telephone Encounter (Signed)
Patient is calling for clarification. Her AVS shows : Follow-up with Dr. Charleen Kirks for epidural steroid injection as scheduled. She does not see a Dr. Charleen Kirks. Please advise

## 2023-02-17 ENCOUNTER — Ambulatory Visit: Payer: Medicare Other | Attending: Physical Medicine & Rehabilitation

## 2023-02-17 DIAGNOSIS — M5459 Other low back pain: Secondary | ICD-10-CM | POA: Insufficient documentation

## 2023-02-17 DIAGNOSIS — M6281 Muscle weakness (generalized): Secondary | ICD-10-CM | POA: Diagnosis present

## 2023-02-17 DIAGNOSIS — R262 Difficulty in walking, not elsewhere classified: Secondary | ICD-10-CM | POA: Insufficient documentation

## 2023-02-17 DIAGNOSIS — R2681 Unsteadiness on feet: Secondary | ICD-10-CM | POA: Diagnosis present

## 2023-02-17 NOTE — Therapy (Signed)
OUTPATIENT PHYSICAL THERAPY NEURO TREATMENT NOTE   Patient Name: Carla May MRN: [id number] DOB:1941-02-01, 82 y.o., female Today's Date: 02/17/2023   PCP: Mckinley Jewel, MD  REFERRING PROVIDER:  Charlett Blake, MD     END OF SESSION:  PT End of Session - 02/17/23 0924     Visit Number 6    Number of Visits 25    Date for PT Re-Evaluation 04/20/23    PT Start Time 0848    PT Stop Time 0930    PT Time Calculation (min) 42 min    Equipment Utilized During Treatment Gait belt    Activity Tolerance Patient tolerated treatment well;Patient limited by pain    Behavior During Therapy WFL for tasks assessed/performed               Past Medical History:  Diagnosis Date   Anxiety    Arthritis    back. neck   Diabetes mellitus without complication    Dysrhythmia    GERD (gastroesophageal reflux disease)    History of palpitations    HOH (hard of hearing) 06/28/2019   Hyperlipidemia    diet controlled and fish oil   MVP (mitral valve prolapse)    never has caused any problems per patient on 06/27/19   PONV (postoperative nausea and vomiting)    No problems in 08/2018; however has had previous problems   Pre-diabetes    diet controlled, no med   Retinal detachment    OU   SVD (spontaneous vaginal delivery)    x 2   Past Surgical History:  Procedure Laterality Date   ABDOMINAL HYSTERECTOMY  1970's   AIR/FLUID EXCHANGE Left 06/28/2019   Procedure: Air/Fluid Exchange;  Surgeon: Bernarda Caffey, MD;  Location: [facility];  Service: Ophthalmology;  Laterality: Left;   CATARACT EXTRACTION Bilateral    Dr. Talbert Forest   COLONOSCOPY     polyp   EYE SURGERY Bilateral    Cat Sx and RD repair   GAS INSERTION Right 11/26/2014   Procedure: INSERTION OF GAS;  Surgeon: Hayden Pedro, MD;  Location: [facility];  Service: Ophthalmology;  Laterality: Right;  C3F8   GAS/FLUID EXCHANGE Left 08/21/2018   Procedure: GAS/FLUID EXCHANGE;  Surgeon: Bernarda Caffey, MD;  Location: [place];   Service: Ophthalmology;  Laterality: Left;  C3F8   KNEE ARTHROSCOPY Left    LASER PHOTO ABLATION Left 06/28/2019   Procedure: Laser Photo Ablation;  Surgeon: Bernarda Caffey, MD;  Location: [facility];  Service: Ophthalmology;  Laterality: Left;   PARS PLANA VITRECTOMY Left 06/28/2019   Procedure: PARS PLANA VITRECTOMY WITH 25 GAUGE;  Surgeon: Bernarda Caffey, MD;  Location: [facility];  Service: Ophthalmology;  Laterality: Left;   PHOTOCOAGULATION WITH LASER Right 11/26/2014   Procedure: PHOTOCOAGULATION WITH LASER;  Surgeon: Hayden Pedro, MD;  Location: [facility];  Service: Ophthalmology;  Laterality: Right;   PHOTOCOAGULATION WITH LASER Left 08/21/2018   Procedure: PHOTOCOAGULATION WITH LASER;  Surgeon: Bernarda Caffey, MD;  Location: [place];  Service: Ophthalmology;  Laterality: Left;   REPAIR OF COMPLEX TRACTION RETINAL DETACHMENT Left 12/06/2018   Procedure: REPAIR OF COMPLEX TRACTION RETINAL DETACHMENT, 25 gauge vitrectomy, endolaser photocoagulation, memebrane peel, perfluoron injection,  and silicone oil;  Surgeon: Bernarda Caffey, MD;  Location: [facility];  Service: Ophthalmology;  Laterality: Left;   RETINAL DETACHMENT SURGERY Bilateral    SBP OD - Dr. Tempie Hoist (1.12.16).  SBP - Dr. Bernarda Caffey (10.7.19)   SCLERAL BUCKLE Right 11/26/2014   Procedure:  SCLERAL BUCKLE RIGHT EYE ;  Surgeon: Hayden Pedro, MD;  Location: [facility];  Service: Ophthalmology;  Laterality: Right;   SCLERAL BUCKLE WITH POSSIBLE 25 GAUGE PARS PLANA VITRECTOMY Left 08/21/2018   Procedure: SCLERAL BUCKLE WITH 25 GAUGE PARS PLANA VITRECTOMY;  Surgeon: Bernarda Caffey, MD;  Location: [place];  Service: Ophthalmology;  Laterality: Left;   SILICON OIL REMOVAL Left [id number]   Procedure: Silicon Oil Removal;  Surgeon: Bernarda Caffey, MD;  Location: [place];  Service: Ophthalmology;  Laterality: Left;   TONSILLECTOMY     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   Procedure: TOTAL KNEE ARTHROPLASTY;  Surgeon: Gaynelle Arabian, MD;  Location: [facility];   Service: Orthopedics;  Laterality: Left;   Patient Active Problem List   Diagnosis Date Noted   Hereditary and idiopathic peripheral neuropathy 11/29/2022   Numbness and tingling of foot 11/29/2022   Chronic bilateral low back pain with right-sided sciatica 09/06/2022   OA (osteoarthritis) of knee 05/03/2022   Osteoarthritis of left knee 05/03/2022   HOH (hard of hearing) 06/28/2019   Rhegmatogenous retinal detachment of right eye 11/26/2014    ONSET DATE: 2021, years ago  REFERRING DIAG: M48.062 (ICD-10-CM) - Spinal stenosis of lumbar region with neurogenic claudication   THERAPY DIAG:  Other low back pain  Muscle weakness (generalized)  Rationale for Evaluation and Treatment: Rehabilitation  SUBJECTIVE:                                                                                                                                                                                             SUBJECTIVE STATEMENT: Pt reports back pain is about the same as last time, 4/10. Pt reports she has not been performing her HEP at recommended frequency, stating "not as much as I should" when asked how often performing HEP. Pt wants to improve her endurance/activity tolerance.   Pt accompanied by: self  PERTINENT HISTORY:  The pt is a pleasant 82 yo female referred to PT for lumbar spinal stenosis with neurogenic claudication. Pt describes chronic low back pain with numbness of LLE. Pt reports she has had back pain for years. Her pain is more prominent in her RLE than LLE. She feels pain into her toes,describes it as a numbness, tightness. Pt trying PT first before pursuing injection for her back. She reports back pain is 10+/10. Pt takes Tylenol to manage pain. Currently LBP is impacting her ability to walk, which is limited to 5 minutes. She has difficulty lifting and performing transfers.. She reports poor control of RLE which makes it difficult to get dressed. Pt reports decreased balance.  She stumbles when  she stands up and has difficulty walking on uneven surfaces. She also feels out of breath easily. 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 Yes: NPRS scale: 10+/10 Pain location: LBP, BLE pain (L worse than R) Pain description: tightness, numbness Aggravating factors: movement Relieving factors: Tylenol  PRECAUTIONS: Fall  WEIGHT BEARING RESTRICTIONS: No  FALLS: Has patient fallen in last 6 months? No  LIVING ENVIRONMENT: Lives with: lives with their spouseSingle point cane, Walker - 2 wheeled, and shower chair Lives in: House/apartment Stairs: Yes: External: 2-3 steps; none Has following equipment at home:   PLOF: Independent  PATIENT GOALS: Pt would like to improve RLE flexibility and control, reducing pain, improving walking  OBJECTIVE:   DIAGNOSTIC FINDINGS:   MR lumbar spine 02/27/20 via chart Narrative & Impression  CLINICAL DATA:  Sciatica, unspecified laterality. Sciatica of left side. Additional history provided by technologist: Patient reports low back pain, worse on the left side, cramps and numbness in left foot, symptoms for 2 months.   EXAM: MRI LUMBAR SPINE WITHOUT CONTRAST   TECHNIQUE: Multiplanar, multisequence MR imaging of the lumbar spine was performed. No intravenous contrast was administered.   COMPARISON:  No pertinent prior studies available for comparison.   FINDINGS: Segmentation: For the purposes of this dictation, five lumbar vertebrae are assumed and the caudal most well-formed intervertebral disc is designated L5-S1.   Alignment: Straightening of the expected lumbar lordosis. No significant spondylolisthesis   Vertebrae: Vertebral body height is maintained. Multilevel degenerative endplate irregularity. Trace degenerative endplate edema at the 624THL, L4-L5 and  L5-S1 levels   Conus medullaris and cauda equina: Conus extends to the L1 level. No signal abnormality within the visualized distal spinal cord.   Paraspinal and other soft tissues: No abnormality identified within included portions of the abdomen/retroperitoneum. Atrophy of the lumbar paraspinal musculature.   Disc levels:   Moderate/advanced disc degeneration at the L2-L3 through L5-S1 levels. Mild disc degeneration at L1-L2.   T12-L1: No disc herniation. No significant canal or foraminal stenosis.   L1-L2: Mild facet arthrosis. No disc herniation. No significant canal or foraminal stenosis.   L2-L3: Disc bulge with circumferential osteophyte ridge. Disc osteophyte ridge is more focally prominent within the left subarticular zone. Mild facet arthrosis/ligamentum flavum hypertrophy. Moderate left subarticular narrowing with crowding of the descending left L3 nerve root (series 5, image 15). Central canal patent. No significant foraminal stenosis.   L3-L4: Disc bulge with endplate spurring. Moderate facet arthrosis/ligamentum flavum hypertrophy (greater on the left). Mild left subarticular narrowing without nerve root impingement. Central canal patent. Mild bilateral neural foraminal narrowing.   L4-L5: Disc bulge with endplate spurring. Superimposed shallow right center/subarticular disc protrusion. Mild facet arthrosis/ligamentum flavum hypertrophy. Moderate right subarticular narrowing with contact upon the descending right L5 nerve root. Central canal patent. Mild right neural foraminal narrowing.   L5-S1: Disc bulge with endplate spurring. Superimposed broad-based right foraminal disc protrusion. Moderate facet arthrosis/ligamentum flavum hypertrophy. Mild bilateral subarticular narrowing (greater on the left) without frank nerve root impingement. Central canal patent. Bilateral neural foraminal narrowing (moderate right, mild left).   IMPRESSION: Lumbar spondylosis  as outlined and most notably as follows.   Moderate/advanced disc degeneration at the L2-L3 through L5-S1 levels.   At L2-L3, disc osteophyte ridge contributes to multifactorial moderate left subarticular narrowing with crowding of the descending left L3 nerve root.   At L4-L5, a  shallow right center/subarticular disc protrusion contributes to moderate right subarticular narrowing with contact upon the descending right L5 nerve root. Mild multifactorial right neural foraminal narrowing also present at this level.   At L5-S1, a broad-based disc protrusion contributes to multifactorial moderate right neural foraminal narrowing. Mild bilateral subarticular and left neural foraminal narrowing also present at this level.   Additional sites of mild subarticular and neural foraminal narrowing as described".      COGNITION: Overall cognitive status: Within functional limits for tasks assessed   SENSATION: In tact to light touch BLE wit testing, but does report numbness felt in LLE  COORDINATION:  WNL BLE   EDEMA:  Pt reports mild swelling in BLE (reports doctor aware)   MUSCLE LENGTH: Hamstring length: LLE lacking 17 deg RLE lacking 15 deg   POSTURE: abnormal, noted particularly with standing/gait, pt with R trunk lean,  LOWER EXTREMITY MMT:     BLE musculature generally 4+/5 with the following exceptions:  hip flexors 4-/5 bilat, hip er/ir each 4+/5 B      LUMBAR ROM:   AROM eval  Flexion 80%*  Extension WFL, however painful  Right lateral flexion 90%*  Left lateral flexion 70%*  Right rotation 80%*  Left rotation WFL   (Blank rows = not tested) *=pain limited   Palpation: Assessment performed in seated, pt found to have no tenderness with palpation throughout low back musculature  No tenderness found along T-spine over lumbar spine, however, further assessment with pt in prone needed to assess for mobility    LUMBAR SPECIAL TESTS:   deferred  TRANSFERS: Assistive device utilized: None  Sit to stand:  hands-free but painful Stand to sit: Complete Independence Chair to chair: Complete Independence   GAIT: Gait pattern:  noted decreased trunk rotation, R lean, decrease gait speed Distance walked: 10 meters, clinic distances  Assistive device utilized: None Level of assistance: SBA  FUNCTIONAL TESTS:  5 times sit to stand: 15 sec hands free but painful in low back and bilat knees 10MWT: 0.75 m/s no AD, somewhat painful, pt with R trunk lean, decreased trunk rotation  PATIENT SURVEYS:  Modified Oswestry 40%  FOTO 44 Berg: 53/56   TODAY'S TREATMENT:                                                                                                                              DATE:  Heat donned to low back while pt performs therex.  Pt reports heat feels good, skin checked and is WNL, no adverse reaction to heat upon removal of heat.   TherEx:  2# AW donned each LE -Seated march 3x10 each LE. Notes feeling of RLE tightness, but reports challenge is felt mostly LLE  -Seated hip ER/IR with adductor isometric pball squeeze 3x10 each LE for each. Rates LLE as more challenging. Cuing for technique with demo/VC  Physioball rollouts forward/backward and LTL 60 sec of each. Reports good stretch with LTL rollout  Nustep lvl 1 total 6 min. Cuing for SPM range and to increase speed until pt feels a moderate challenge. Performed to target LE and cardio endurance, to improve activity tolerance.   On mat table: heat donned to low back LTRs 1x 3 reps each side. Terminated due to increased pain. Pain felt mostly on R side.  Knee to chest stretch 2x30 sec each LE. Reports intervention is pain-free Bridge 15x, 12x. Reports initial pain but this improves with reps. Sciatic nerve floss 15x each LE  Hooklye figure-four stretch 2x30 sec each LE Reports feels good.  Pt reviews with pt importance of performing HEP at  recommended frequency in order to benefit from interventions and progress.    PATIENT EDUCATION: Education details: Pt educated throughout session about proper posture and technique with exercises. Improved exercise technique, movement at target joints, use of target muscles after min to mod verbal, visual, tactile cues. Importance of performing HEP at recommended frequency  Person educated: Patient Education method: Explanation Education comprehension: verbalized understanding  HOME EXERCISE PROGRAM: Access Code: [email or web address] URL: [email or web address] Date: 02/01/2023 Prepared by: Ricard Dillon  Exercises - Supine Bridge  - 1 x daily - 5 x weekly - 3 sets - 10 reps - Supine Straight Leg Raise  - 1 x daily - 5 x weekly - 2 sets - 10 reps - Clamshell  - 1 x daily - 5 x weekly - 2 sets - 15 reps - Seated Hamstring Stretch  - 1 x daily - 7 x weekly - 1 sets - 2 reps - 30 seconds hold  GOALS: Goals reviewed with patient? Yes     SHORT TERM GOALS: Target date: 03/09/2023   Patient will be independent in home exercise program to improve strength/mobility for better functional independence with ADLs. Baseline:to be initiated Goal status: INITIAL   LONG TERM GOALS: Target date: 04/20/2023   Patient will increase FOTO score to equal to or greater than  54 to demonstrate statistically significant improvement in mobility and quality of life.  Baseline: 44 Goal status: INITIAL  2.  Patient  will complete five times sit to stand test in < 10 seconds indicating an increased LE strength and improved balance. Baseline: 14 seconds, painful Goal status: INITIAL  3.  Patient will increase Berg Balance tandem stance and SLB score by at least 1 pt to demonstrate decreased fall risk during functional activities Baseline: to be completed next 1-2 visits; 3/19: SLB 2/4, tandem 3/4 Goal status: REVISED  4.  Patient will increase 10 meter walk test to >1.77m/s as to improve gait speed  for better community ambulation and to reduce fall risk. Baseline: 0.75 m/s, painful Goal status: INITIAL  6. Patient will reduce modified Oswestry score to <20 as to demonstrate minimal disability with ADLs including improved sleeping tolerance, walking/sitting tolerance etc for better mobility with ADLs.   Baseline: 40% Goal status: INITIAL   ASSESSMENT:  CLINICAL IMPRESSION: Pt reviews with pt importance of performing HEP at recommended frequency in order to benefit from interventions and progress as pt reported not performing HEP as often as she should, concerned about her pain-levels remaining fairly consistent. PT adjusted/modified interventions throughout so pt could perform LE strengthening and mobility exercises without increased pain. Pt only had to terminated one intervention, LTRs, due to pain increase. She reported figure-four stretch in hooklye as pain-relieving.The pt will benefit from further skilled PT to address these deficits in order to improve QOL, functional mobility and to decrease fall risk.   OBJECTIVE  IMPAIRMENTS: Abnormal gait, decreased activity tolerance, decreased balance, decreased endurance, decreased mobility, difficulty walking, decreased ROM, decreased strength, improper body mechanics, postural dysfunction, and pain.   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   REHAB POTENTIAL: Fair    CLINICAL DECISION MAKING: Evolving/moderate complexity  EVALUATION COMPLEXITY:  Moderate  PLAN:  PT FREQUENCY: 2x/week  PT DURATION: 12 weeks  PLANNED INTERVENTIONS: Therapeutic exercises, Therapeutic activity, Neuromuscular re-education, Balance training, Gait training, Patient/Family education, Self Care, Joint mobilization, Joint manipulation, Stair training, Vestibular training, Canalith repositioning, Orthotic/Fit training, DME instructions, Dry Needling, Electrical stimulation, Spinal manipulation, Spinal mobilization, Cryotherapy, Moist heat, Splintting, Taping, Traction, Manual therapy, and Re-evaluation  PLAN FOR NEXT SESSION: mobility, strength, balance   Zollie Pee, PT 02/17/2023, 9:41 AM

## 2023-02-17 NOTE — Assessment & Plan Note (Addendum)
Likely d/t radiculopathy R L5; symptoms do not extend to plantar foot on exam  I am starting you back on gabapentin 300 mg at night to help with your back pain and nerve pain in your feet.  After 2 weeks, if no side effects from this medication and no notable effect, you can increase it to 2 capsules at night.  If you start to experience side effects, call me and we may switch you back to meloxicam or try a different nerve pain medication.  Follow-up with me in 3 months.

## 2023-02-17 NOTE — Assessment & Plan Note (Addendum)
Continue physical therapy for your back.  I recommend discussing with your therapist some mutual goals, such as gardening and planting tolerance, to help guide your therapies.  Continue Tylenol for pain control.    Follow-up with Dr. Letta Pate for epidural steroid injection as scheduled.

## 2023-02-22 ENCOUNTER — Ambulatory Visit: Payer: Medicare Other

## 2023-02-22 DIAGNOSIS — R262 Difficulty in walking, not elsewhere classified: Secondary | ICD-10-CM

## 2023-02-22 DIAGNOSIS — R2681 Unsteadiness on feet: Secondary | ICD-10-CM

## 2023-02-22 DIAGNOSIS — M6281 Muscle weakness (generalized): Secondary | ICD-10-CM

## 2023-02-22 DIAGNOSIS — M5459 Other low back pain: Secondary | ICD-10-CM | POA: Diagnosis not present

## 2023-02-22 NOTE — Therapy (Signed)
OUTPATIENT PHYSICAL THERAPY NEURO TREATMENT NOTE   Patient Name: Carla May MRN: [id number] DOB:11/09/1941, 82 y.o., female Today's Date: 02/22/2023   PCP: Ollen Bowl, MD  REFERRING PROVIDER:  Erick Colace, MD     END OF SESSION:  PT End of Session - 02/22/23 1005     Visit Number 7    Number of Visits 25    Date for PT Re-Evaluation 04/20/23    PT Start Time 1015    PT Stop Time 1059    PT Time Calculation (min) 44 min    Equipment Utilized During Treatment Gait belt    Activity Tolerance Patient tolerated treatment well;Patient limited by pain    Behavior During Therapy WFL for tasks assessed/performed                Past Medical History:  Diagnosis Date   Anxiety    Arthritis    back. neck   Diabetes mellitus without complication    Dysrhythmia    GERD (gastroesophageal reflux disease)    History of palpitations    HOH (hard of hearing) 06/28/2019   Hyperlipidemia    diet controlled and fish oil   MVP (mitral valve prolapse)    never has caused any problems per patient on 06/27/19   PONV (postoperative nausea and vomiting)    No problems in 08/2018; however has had previous problems   Pre-diabetes    diet controlled, no med   Retinal detachment    OU   SVD (spontaneous vaginal delivery)    x 2   Past Surgical History:  Procedure Laterality Date   ABDOMINAL HYSTERECTOMY  1970's   AIR/FLUID EXCHANGE Left 06/28/2019   Procedure: Air/Fluid Exchange;  Surgeon: Rennis Chris, MD;  Location: [facility] OR;  Service: Ophthalmology;  Laterality: Left;   CATARACT EXTRACTION Bilateral    Dr. Vonna Kotyk   COLONOSCOPY     polyp   EYE SURGERY Bilateral    Cat Sx and RD repair   GAS INSERTION Right 11/26/2014   Procedure: INSERTION OF GAS;  Surgeon: Sherrie George, MD;  Location: [facility] OR;  Service: Ophthalmology;  Laterality: Right;  C3F8   GAS/FLUID EXCHANGE Left 08/21/2018   Procedure: GAS/FLUID EXCHANGE;  Surgeon: Rennis Chris, MD;  Location: [facility] OR;   Service: Ophthalmology;  Laterality: Left;  C3F8   KNEE ARTHROSCOPY Left    LASER PHOTO ABLATION Left 06/28/2019   Procedure: Laser Photo Ablation;  Surgeon: Rennis Chris, MD;  Location: [facility] OR;  Service: Ophthalmology;  Laterality: Left;   PARS PLANA VITRECTOMY Left 06/28/2019   Procedure: PARS PLANA VITRECTOMY WITH 25 GAUGE;  Surgeon: Rennis Chris, MD;  Location: [facility] OR;  Service: Ophthalmology;  Laterality: Left;   PHOTOCOAGULATION WITH LASER Right 11/26/2014   Procedure: PHOTOCOAGULATION WITH LASER;  Surgeon: Sherrie George, MD;  Location: [facility] OR;  Service: Ophthalmology;  Laterality: Right;   PHOTOCOAGULATION WITH LASER Left 08/21/2018   Procedure: PHOTOCOAGULATION WITH LASER;  Surgeon: Rennis Chris, MD;  Location: [facility] OR;  Service: Ophthalmology;  Laterality: Left;   REPAIR OF COMPLEX TRACTION RETINAL DETACHMENT Left 12/06/2018   Procedure: REPAIR OF COMPLEX TRACTION RETINAL DETACHMENT, 25 gauge vitrectomy, endolaser photocoagulation, memebrane peel, perfluoron injection,  and silicone oil;  Surgeon: Rennis Chris, MD;  Location: [facility] OR;  Service: Ophthalmology;  Laterality: Left;   RETINAL DETACHMENT SURGERY Bilateral    SBP OD - Dr. Alan Mulder (1.12.16).  SBP - Dr. Rennis Chris (10.7.19)   SCLERAL BUCKLE Right 11/26/2014  Procedure: SCLERAL BUCKLE RIGHT EYE ;  Surgeon: Sherrie George, MD;  Location: [facility] OR;  Service: Ophthalmology;  Laterality: Right;   SCLERAL BUCKLE WITH POSSIBLE 25 GAUGE PARS PLANA VITRECTOMY Left 08/21/2018   Procedure: SCLERAL BUCKLE WITH 25 GAUGE PARS PLANA VITRECTOMY;  Surgeon: Rennis Chris, MD;  Location: [facility] OR;  Service: Ophthalmology;  Laterality: Left;   SILICON OIL REMOVAL Left 06/28/2019   Procedure: Silicon Oil Removal;  Surgeon: Rennis Chris, MD;  Location: [facility] OR;  Service: Ophthalmology;  Laterality: Left;   TONSILLECTOMY     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   Procedure: TOTAL KNEE ARTHROPLASTY;  Surgeon: Ollen Gross, MD;  Location: [facility] ORS;   Service: Orthopedics;  Laterality: Left;   Patient Active Problem List   Diagnosis Date Noted   Hereditary and idiopathic peripheral neuropathy 11/29/2022   Numbness and tingling of foot 11/29/2022   Chronic bilateral low back pain with right-sided sciatica 09/06/2022   OA (osteoarthritis) of knee 05/03/2022   Osteoarthritis of left knee 05/03/2022   HOH (hard of hearing) 06/28/2019   Rhegmatogenous retinal detachment of right eye 11/26/2014    ONSET DATE: 2021, years ago  REFERRING DIAG: M48.062 (ICD-10-CM) - Spinal stenosis of lumbar region with neurogenic claudication   THERAPY DIAG:  Other low back pain  Muscle weakness (generalized)  Unsteadiness on feet  Difficulty in walking, not elsewhere classified  Rationale for Evaluation and Treatment: Rehabilitation  SUBJECTIVE:                                                                                                                                                                                             SUBJECTIVE STATEMENT: Patient reports she took a gabapentin last night and is still feeling pretty groggy from it.    Pt accompanied by: self  PERTINENT HISTORY:  The pt is a pleasant 82 yo female referred to PT for lumbar spinal stenosis with neurogenic claudication. Pt describes chronic low back pain with numbness of LLE. Pt reports she has had back pain for years. Her pain is more prominent in her RLE than LLE. She feels pain into her toes,describes it as a numbness, tightness. Pt trying PT first before pursuing injection for her back. She reports back pain is 10+/10. Pt takes Tylenol to manage pain. Currently LBP is impacting her ability to walk, which is limited to 5 minutes. She has difficulty lifting and performing transfers.. She reports poor control of RLE which makes it difficult to get dressed. Pt reports decreased balance. She stumbles when she stands up and has difficulty walking on uneven surfaces. She also  feels out of  breath easily. 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 Yes: NPRS scale: 10+/10 Pain location: LBP, BLE pain (L worse than R) Pain description: tightness, numbness Aggravating factors: movement Relieving factors: Tylenol  PRECAUTIONS: Fall  WEIGHT BEARING RESTRICTIONS: No  FALLS: Has patient fallen in last 6 months? No  LIVING ENVIRONMENT: Lives with: lives with their spouseSingle point cane, Walker - 2 wheeled, and shower chair Lives in: House/apartment Stairs: Yes: External: 2-3 steps; none Has following equipment at home:   PLOF: Independent  PATIENT GOALS: Pt would like to improve RLE flexibility and control, reducing pain, improving walking  OBJECTIVE:   DIAGNOSTIC FINDINGS:   MR lumbar spine 02/27/20 via chart Narrative & Impression  CLINICAL DATA:  Sciatica, unspecified laterality. Sciatica of left side. Additional history provided by technologist: Patient reports low back pain, worse on the left side, cramps and numbness in left foot, symptoms for 2 months.   EXAM: MRI LUMBAR SPINE WITHOUT CONTRAST   TECHNIQUE: Multiplanar, multisequence MR imaging of the lumbar spine was performed. No intravenous contrast was administered.   COMPARISON:  No pertinent prior studies available for comparison.   FINDINGS: Segmentation: For the purposes of this dictation, five lumbar vertebrae are assumed and the caudal most well-formed intervertebral disc is designated L5-S1.   Alignment: Straightening of the expected lumbar lordosis. No significant spondylolisthesis   Vertebrae: Vertebral body height is maintained. Multilevel degenerative endplate irregularity. Trace degenerative endplate edema at the L2-L3, L4-L5 and L5-S1 levels   Conus medullaris and cauda equina: Conus extends to the L1 level.  No signal abnormality within the visualized distal spinal cord.   Paraspinal and other soft tissues: No abnormality identified within included portions of the abdomen/retroperitoneum. Atrophy of the lumbar paraspinal musculature.   Disc levels:   Moderate/advanced disc degeneration at the L2-L3 through L5-S1 levels. Mild disc degeneration at L1-L2.   T12-L1: No disc herniation. No significant canal or foraminal stenosis.   L1-L2: Mild facet arthrosis. No disc herniation. No significant canal or foraminal stenosis.   L2-L3: Disc bulge with circumferential osteophyte ridge. Disc osteophyte ridge is more focally prominent within the left subarticular zone. Mild facet arthrosis/ligamentum flavum hypertrophy. Moderate left subarticular narrowing with crowding of the descending left L3 nerve root (series 5, image 15). Central canal patent. No significant foraminal stenosis.   L3-L4: Disc bulge with endplate spurring. Moderate facet arthrosis/ligamentum flavum hypertrophy (greater on the left). Mild left subarticular narrowing without nerve root impingement. Central canal patent. Mild bilateral neural foraminal narrowing.   L4-L5: Disc bulge with endplate spurring. Superimposed shallow right center/subarticular disc protrusion. Mild facet arthrosis/ligamentum flavum hypertrophy. Moderate right subarticular narrowing with contact upon the descending right L5 nerve root. Central canal patent. Mild right neural foraminal narrowing.   L5-S1: Disc bulge with endplate spurring. Superimposed broad-based right foraminal disc protrusion. Moderate facet arthrosis/ligamentum flavum hypertrophy. Mild bilateral subarticular narrowing (greater on the left) without frank nerve root impingement. Central canal patent. Bilateral neural foraminal narrowing (moderate right, mild left).   IMPRESSION: Lumbar spondylosis as outlined and most notably as follows.   Moderate/advanced disc degeneration  at the L2-L3 through L5-S1 levels.   At L2-L3, disc osteophyte ridge contributes to multifactorial moderate left subarticular narrowing with crowding of the descending left L3 nerve root.   At L4-L5, a shallow right center/subarticular disc protrusion contributes to moderate right subarticular narrowing with contact upon the  descending right L5 nerve root. Mild multifactorial right neural foraminal narrowing also present at this level.   At L5-S1, a broad-based disc protrusion contributes to multifactorial moderate right neural foraminal narrowing. Mild bilateral subarticular and left neural foraminal narrowing also present at this level.   Additional sites of mild subarticular and neural foraminal narrowing as described".      COGNITION: Overall cognitive status: Within functional limits for tasks assessed   SENSATION: In tact to light touch BLE wit testing, but does report numbness felt in LLE  COORDINATION:  WNL BLE   EDEMA:  Pt reports mild swelling in BLE (reports doctor aware)   MUSCLE LENGTH: Hamstring length: LLE lacking 17 deg RLE lacking 15 deg   POSTURE: abnormal, noted particularly with standing/gait, pt with R trunk lean,  LOWER EXTREMITY MMT:     BLE musculature generally 4+/5 with the following exceptions:  hip flexors 4-/5 bilat, hip er/ir each 4+/5 B      LUMBAR ROM:   AROM eval  Flexion 80%*  Extension WFL, however painful  Right lateral flexion 90%*  Left lateral flexion 70%*  Right rotation 80%*  Left rotation WFL   (Blank rows = not tested) *=pain limited   Palpation: Assessment performed in seated, pt found to have no tenderness with palpation throughout low back musculature  No tenderness found along T-spine over lumbar spine, however, further assessment with pt in prone needed to assess for mobility    LUMBAR SPECIAL TESTS:  deferred  TRANSFERS: Assistive device utilized: None  Sit to stand:  hands-free but painful Stand  to sit: Complete Independence Chair to chair: Complete Independence   GAIT: Gait pattern:  noted decreased trunk rotation, R lean, decrease gait speed Distance walked: 10 meters, clinic distances  Assistive device utilized: None Level of assistance: SBA  FUNCTIONAL TESTS:  5 times sit to stand: 15 sec hands free but painful in low back and bilat knees : 0.75 m/s no AD, somewhat painful, pt with R trunk lean, decreased trunk rotation  PATIENT SURVEYS:  Modified Oswestry 40%  FOTO 44 Berg: 53/56   TODAY'S TREATMENT:                                                                                                                              DATE:  Heat donned to low back while pt performs therex.  Pt reports heat feels good, skin checked and is WNL, no adverse reaction to heat upon removal of heat.   TherEx:  Seated: 2# AW donned each LE -Seated march 2x10 each LE. Notes feeling of RLE tightness, but reports challenge is felt mostly LLE  -Seated hip ER/IR with adductor isometric pball squeeze 2x10 each LE for each. Rates LLE as more challenging. Cuing for technique with demo/VC  Posterior pelvic tilt 10x Heel toe raises 15x Physioball rollouts forward/backward and LTL 60 sec of each. Reports good stretch with LTL rollout   Nustep lvl 2 total  4 min. Cuing for SPM range and to increase speed until pt feels a moderate challenge. Performed to target LE and cardio endurance, to improve activity tolerance.   Supine:  On mat table: heat donned to low back Hamstring lengthening stretch 60 seconds each LE  Knee to chest stretch 2x30 sec each LE. Reports intervention is pain-free Bridge 15x,  Sciatic nerve floss 15x each LE  Hooklye figure-four stretch 2x30 sec each LE Reports feels good. Posterior pelvic tilt 10x  Posterior pelvic tilt 10 adduction ball squeeze  RTB abduction 15x  Pt reviews with pt importance of performing HEP at recommended frequency in order to benefit  from interventions and progress.    PATIENT EDUCATION: Education details: Pt educated throughout session about proper posture and technique with exercises. Improved exercise technique, movement at target joints, use of target muscles after min to mod verbal, visual, tactile cues. Importance of performing HEP at recommended frequency  Person educated: Patient Education method: Explanation Education comprehension: verbalized understanding  HOME EXERCISE PROGRAM: Access Code: [email or web address] URL: [email or web address] Date: 02/01/2023 Prepared by: Temple PaciniHaley Barak  Exercises - Supine Bridge  - 1 x daily - 5 x weekly - 3 sets - 10 reps - Supine Straight Leg Raise  - 1 x daily - 5 x weekly - 2 sets - 10 reps - Clamshell  - 1 x daily - 5 x weekly - 2 sets - 15 reps - Seated Hamstring Stretch  - 1 x daily - 7 x weekly - 1 sets - 2 reps - 30 seconds hold  GOALS: Goals reviewed with patient? Yes     SHORT TERM GOALS: Target date: 03/09/2023   Patient will be independent in home exercise program to improve strength/mobility for better functional independence with ADLs. Baseline:to be initiated Goal status: INITIAL   LONG TERM GOALS: Target date: 04/20/2023   Patient will increase FOTO score to equal to or greater than  54 to demonstrate statistically significant improvement in mobility and quality of life.  Baseline: 44 Goal status: INITIAL  2.  Patient  will complete five times sit to stand test in < 10 seconds indicating an increased LE strength and improved balance. Baseline: 14 seconds, painful Goal status: INITIAL  3.  Patient will increase Berg Balance tandem stance and SLB score by at least 1 pt to demonstrate decreased fall risk during functional activities Baseline: to be completed next 1-2 visits; 3/19: SLB 2/4, tandem 3/4 Goal status: REVISED  4.  Patient will increase 10 meter walk test to >1.6121m/s as to improve gait speed for better community ambulation and to  reduce fall risk. Baseline: 0.75 m/s, painful Goal status: INITIAL  6. Patient will reduce modified Oswestry score to <20 as to demonstrate minimal disability with ADLs including improved sleeping tolerance, walking/sitting tolerance etc for better mobility with ADLs.   Baseline: 40% Goal status: INITIAL   ASSESSMENT:  CLINICAL IMPRESSION:  Patient is a little groggy from her medication during the session. Patient does have increased pain this session in back and shoulder. She does require additional cueing for task performance this session, potentially due to grogginess. Limited hamstring length noted bilateral. Bridges were painful per patient report. The pt will benefit from further skilled PT to address these deficits in order to improve QOL, functional mobility and to decrease fall risk.   OBJECTIVE IMPAIRMENTS: Abnormal gait, decreased activity tolerance, decreased balance, decreased endurance, decreased mobility, difficulty walking, decreased ROM, decreased strength, improper body mechanics, postural dysfunction, and pain.   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   REHAB POTENTIAL: Fair    CLINICAL DECISION MAKING: Evolving/moderate complexity  EVALUATION COMPLEXITY: Moderate  PLAN:  PT FREQUENCY: 2x/week  PT DURATION: 12 weeks  PLANNED INTERVENTIONS: Therapeutic exercises, Therapeutic activity, Neuromuscular re-education, Balance training, Gait training, Patient/Family education, Self Care,  Joint mobilization, Joint manipulation, Stair training, Vestibular training, Canalith repositioning, Orthotic/Fit training, DME instructions, Dry Needling, Electrical stimulation, Spinal manipulation, Spinal mobilization, Cryotherapy, Moist heat, Splintting, Taping, Traction, Manual therapy, and Re-evaluation  PLAN FOR NEXT SESSION: mobility, strength, balance   Precious Bard, PT 02/22/2023, 11:00 AM

## 2023-02-23 ENCOUNTER — Ambulatory Visit: Payer: Medicare Other

## 2023-02-23 NOTE — Therapy (Signed)
OUTPATIENT PHYSICAL THERAPY NEURO TREATMENT NOTE   Patient Name: Carla May MRN: [id number] DOB:June 01, 1941, 82 y.o., female Today's Date: 02/24/2023   PCP: Ollen Bowl, MD  REFERRING PROVIDER:  Erick Colace, MD     END OF SESSION:  PT End of Session - 02/24/23 1005     Visit Number 8    Number of Visits 25    Date for PT Re-Evaluation 04/20/23    PT Start Time 1014    PT Stop Time 1059    PT Time Calculation (min) 45 min    Equipment Utilized During Treatment Gait belt    Activity Tolerance Patient tolerated treatment well;Patient limited by pain    Behavior During Therapy WFL for tasks assessed/performed                 Past Medical History:  Diagnosis Date   Anxiety    Arthritis    back. neck   Diabetes mellitus without complication    Dysrhythmia    GERD (gastroesophageal reflux disease)    History of palpitations    HOH (hard of hearing) 06/28/2019   Hyperlipidemia    diet controlled and fish oil   MVP (mitral valve prolapse)    never has caused any problems per patient on 06/27/19   PONV (postoperative nausea and vomiting)    No problems in 08/2018; however has had previous problems   Pre-diabetes    diet controlled, no med   Retinal detachment    OU   SVD (spontaneous vaginal delivery)    x 2   Past Surgical History:  Procedure Laterality Date   ABDOMINAL HYSTERECTOMY  1970's   AIR/FLUID EXCHANGE Left 06/28/2019   Procedure: Air/Fluid Exchange;  Surgeon: Rennis Chris, MD;  Location: [facility] OR;  Service: Ophthalmology;  Laterality: Left;   CATARACT EXTRACTION Bilateral    Dr. Vonna Kotyk   COLONOSCOPY     polyp   EYE SURGERY Bilateral    Cat Sx and RD repair   GAS INSERTION Right 11/26/2014   Procedure: INSERTION OF GAS;  Surgeon: Sherrie George, MD;  Location: [facility] OR;  Service: Ophthalmology;  Laterality: Right;  C3F8   GAS/FLUID EXCHANGE Left 08/21/2018   Procedure: GAS/FLUID EXCHANGE;  Surgeon: Rennis Chris, MD;  Location: [facility]  OR;  Service: Ophthalmology;  Laterality: Left;  C3F8   KNEE ARTHROSCOPY Left    LASER PHOTO ABLATION Left 06/28/2019   Procedure: Laser Photo Ablation;  Surgeon: Rennis Chris, MD;  Location: [facility] OR;  Service: Ophthalmology;  Laterality: Left;   PARS PLANA VITRECTOMY Left 06/28/2019   Procedure: PARS PLANA VITRECTOMY WITH 25 GAUGE;  Surgeon: Rennis Chris, MD;  Location: [facility] OR;  Service: Ophthalmology;  Laterality: Left;   PHOTOCOAGULATION WITH LASER Right 11/26/2014   Procedure: PHOTOCOAGULATION WITH LASER;  Surgeon: Sherrie George, MD;  Location: [facility] OR;  Service: Ophthalmology;  Laterality: Right;   PHOTOCOAGULATION WITH LASER Left 08/21/2018   Procedure: PHOTOCOAGULATION WITH LASER;  Surgeon: Rennis Chris, MD;  Location: [facility] OR;  Service: Ophthalmology;  Laterality: Left;   REPAIR OF COMPLEX TRACTION RETINAL DETACHMENT Left 12/06/2018   Procedure: REPAIR OF COMPLEX TRACTION RETINAL DETACHMENT, 25 gauge vitrectomy, endolaser photocoagulation, memebrane peel, perfluoron injection,  and silicone oil;  Surgeon: Rennis Chris, MD;  Location: [facility] OR;  Service: Ophthalmology;  Laterality: Left;   RETINAL DETACHMENT SURGERY Bilateral    SBP OD - Dr. Alan Mulder (1.12.16).  SBP - Dr. Rennis Chris (10.7.19)   SCLERAL BUCKLE Right 11/26/2014  Procedure: SCLERAL BUCKLE RIGHT EYE ;  Surgeon: Sherrie George, MD;  Location: [facility] OR;  Service: Ophthalmology;  Laterality: Right;   SCLERAL BUCKLE WITH POSSIBLE 25 GAUGE PARS PLANA VITRECTOMY Left 08/21/2018   Procedure: SCLERAL BUCKLE WITH 25 GAUGE PARS PLANA VITRECTOMY;  Surgeon: Rennis Chris, MD;  Location: [facility] OR;  Service: Ophthalmology;  Laterality: Left;   SILICON OIL REMOVAL Left 06/28/2019   Procedure: Silicon Oil Removal;  Surgeon: Rennis Chris, MD;  Location: [facility] OR;  Service: Ophthalmology;  Laterality: Left;   TONSILLECTOMY     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   Procedure: TOTAL KNEE ARTHROPLASTY;  Surgeon: Ollen Gross, MD;  Location: [facility] ORS;   Service: Orthopedics;  Laterality: Left;   Patient Active Problem List   Diagnosis Date Noted   Hereditary and idiopathic peripheral neuropathy 11/29/2022   Numbness and tingling of foot 11/29/2022   Chronic bilateral low back pain with right-sided sciatica 09/06/2022   OA (osteoarthritis) of knee 05/03/2022   Osteoarthritis of left knee 05/03/2022   HOH (hard of hearing) 06/28/2019   Rhegmatogenous retinal detachment of right eye 11/26/2014    ONSET DATE: 2021, years ago  REFERRING DIAG: M48.062 (ICD-10-CM) - Spinal stenosis of lumbar region with neurogenic claudication   THERAPY DIAG:  Other low back pain  Muscle weakness (generalized)  Unsteadiness on feet  Difficulty in walking, not elsewhere classified  Rationale for Evaluation and Treatment: Rehabilitation  SUBJECTIVE:                                                                                                                                                                                             SUBJECTIVE STATEMENT: Patient reports the normal aches and pains. No falls or LOB since last session    Pt accompanied by: self  PERTINENT HISTORY:  The pt is a pleasant 82 yo female referred to PT for lumbar spinal stenosis with neurogenic claudication. Pt describes chronic low back pain with numbness of LLE. Pt reports she has had back pain for years. Her pain is more prominent in her RLE than LLE. She feels pain into her toes,describes it as a numbness, tightness. Pt trying PT first before pursuing injection for her back. She reports back pain is 10+/10. Pt takes Tylenol to manage pain. Currently LBP is impacting her ability to walk, which is limited to 5 minutes. She has difficulty lifting and performing transfers.. She reports poor control of RLE which makes it difficult to get dressed. Pt reports decreased balance. She stumbles when she stands up and has difficulty walking on uneven surfaces. She also feels out of  breath easily.  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 Yes: NPRS scale: 10+/10 Pain location: LBP, BLE pain (L worse than R) Pain description: tightness, numbness Aggravating factors: movement Relieving factors: Tylenol  PRECAUTIONS: Fall  WEIGHT BEARING RESTRICTIONS: No  FALLS: Has patient fallen in last 6 months? No  LIVING ENVIRONMENT: Lives with: lives with their spouseSingle point cane, Walker - 2 wheeled, and shower chair Lives in: House/apartment Stairs: Yes: External: 2-3 steps; none Has following equipment at home:   PLOF: Independent  PATIENT GOALS: Pt would like to improve RLE flexibility and control, reducing pain, improving walking  OBJECTIVE:   DIAGNOSTIC FINDINGS:   MR lumbar spine 02/27/20 via chart Narrative & Impression  CLINICAL DATA:  Sciatica, unspecified laterality. Sciatica of left side. Additional history provided by technologist: Patient reports low back pain, worse on the left side, cramps and numbness in left foot, symptoms for 2 months.   EXAM: MRI LUMBAR SPINE WITHOUT CONTRAST   TECHNIQUE: Multiplanar, multisequence MR imaging of the lumbar spine was performed. No intravenous contrast was administered.   COMPARISON:  No pertinent prior studies available for comparison.   FINDINGS: Segmentation: For the purposes of this dictation, five lumbar vertebrae are assumed and the caudal most well-formed intervertebral disc is designated L5-S1.   Alignment: Straightening of the expected lumbar lordosis. No significant spondylolisthesis   Vertebrae: Vertebral body height is maintained. Multilevel degenerative endplate irregularity. Trace degenerative endplate edema at the L2-L3, L4-L5 and L5-S1 levels   Conus medullaris and cauda equina: Conus extends to the L1 level. No signal  abnormality within the visualized distal spinal cord.   Paraspinal and other soft tissues: No abnormality identified within included portions of the abdomen/retroperitoneum. Atrophy of the lumbar paraspinal musculature.   Disc levels:   Moderate/advanced disc degeneration at the L2-L3 through L5-S1 levels. Mild disc degeneration at L1-L2.   T12-L1: No disc herniation. No significant canal or foraminal stenosis.   L1-L2: Mild facet arthrosis. No disc herniation. No significant canal or foraminal stenosis.   L2-L3: Disc bulge with circumferential osteophyte ridge. Disc osteophyte ridge is more focally prominent within the left subarticular zone. Mild facet arthrosis/ligamentum flavum hypertrophy. Moderate left subarticular narrowing with crowding of the descending left L3 nerve root (series 5, image 15). Central canal patent. No significant foraminal stenosis.   L3-L4: Disc bulge with endplate spurring. Moderate facet arthrosis/ligamentum flavum hypertrophy (greater on the left). Mild left subarticular narrowing without nerve root impingement. Central canal patent. Mild bilateral neural foraminal narrowing.   L4-L5: Disc bulge with endplate spurring. Superimposed shallow right center/subarticular disc protrusion. Mild facet arthrosis/ligamentum flavum hypertrophy. Moderate right subarticular narrowing with contact upon the descending right L5 nerve root. Central canal patent. Mild right neural foraminal narrowing.   L5-S1: Disc bulge with endplate spurring. Superimposed broad-based right foraminal disc protrusion. Moderate facet arthrosis/ligamentum flavum hypertrophy. Mild bilateral subarticular narrowing (greater on the left) without frank nerve root impingement. Central canal patent. Bilateral neural foraminal narrowing (moderate right, mild left).   IMPRESSION: Lumbar spondylosis as outlined and most notably as follows.   Moderate/advanced disc degeneration at the L2-L3  through L5-S1 levels.   At L2-L3, disc osteophyte ridge contributes to multifactorial moderate left subarticular narrowing with crowding of the descending left L3 nerve root.   At L4-L5, a shallow right center/subarticular disc protrusion contributes to moderate right subarticular narrowing with contact upon the descending right  L5 nerve root. Mild multifactorial right neural foraminal narrowing also present at this level.   At L5-S1, a broad-based disc protrusion contributes to multifactorial moderate right neural foraminal narrowing. Mild bilateral subarticular and left neural foraminal narrowing also present at this level.   Additional sites of mild subarticular and neural foraminal narrowing as described".      COGNITION: Overall cognitive status: Within functional limits for tasks assessed   SENSATION: In tact to light touch BLE wit testing, but does report numbness felt in LLE  COORDINATION:  WNL BLE   EDEMA:  Pt reports mild swelling in BLE (reports doctor aware)   MUSCLE LENGTH: Hamstring length: LLE lacking 17 deg RLE lacking 15 deg   POSTURE: abnormal, noted particularly with standing/gait, pt with R trunk lean,  LOWER EXTREMITY MMT:     BLE musculature generally 4+/5 with the following exceptions:  hip flexors 4-/5 bilat, hip er/ir each 4+/5 B      LUMBAR ROM:   AROM eval  Flexion 80%*  Extension WFL, however painful  Right lateral flexion 90%*  Left lateral flexion 70%*  Right rotation 80%*  Left rotation WFL   (Blank rows = not tested) *=pain limited   Palpation: Assessment performed in seated, pt found to have no tenderness with palpation throughout low back musculature  No tenderness found along T-spine over lumbar spine, however, further assessment with pt in prone needed to assess for mobility    LUMBAR SPECIAL TESTS:  deferred  TRANSFERS: Assistive device utilized: None  Sit to stand:  hands-free but painful Stand to sit:  Complete Independence Chair to chair: Complete Independence   GAIT: Gait pattern:  noted decreased trunk rotation, R lean, decrease gait speed Distance walked: 10 meters, clinic distances  Assistive device utilized: None Level of assistance: SBA  FUNCTIONAL TESTS:  5 times sit to stand: 15 sec hands free but painful in low back and bilat knees : 0.75 m/s no AD, somewhat painful, pt with R trunk lean, decreased trunk rotation  PATIENT SURVEYS:  Modified Oswestry 40%  FOTO 44 Berg: 53/56   TODAY'S TREATMENT:                                                                                                                              DATE:  Heat donned to low back while pt performs therex.  Pt reports heat feels good, skin checked and is WNL, no adverse reaction to heat upon removal of heat.   TherEx:  Seated: 2.5# AW donned each LE -Seated march 2x10 each LE. Notes feeling of RLE tightness, but reports challenge is felt mostly LLE  -LAQ 15x each LE  -Seated hip ER/IR with adductor isometric pball squeeze 2x10 each LE for each. Rates LLE as more challenging. Cuing for technique with demo/VC  Posterior pelvic tilt 10x Physioball rollouts forward/backward and LTL 60 sec of each. Reports good stretch with LTL rollout   Nustep lvl 3 total 4  min. Cuing for SPM range and to increase speed until pt feels a moderate challenge. Performed to target LE and cardio endurance, to improve activity tolerance.   Supine:  On mat table: heat donned to low back Hamstring lengthening stretch 60 seconds each LE  Knee to chest stretch 2x30 sec each LE. Reports intervention is pain-free Green swiss ball TrA activation 15x 3 second holds Green swiss ball hamstring curls 15x Bridge 15x,  Sciatic nerve floss 15x each LE  Hooklye figure-four stretch 2x30 sec each LE Reports feels good. Posterior pelvic tilt 10x  Posterior pelvic tilt 10 adduction ball squeeze  RTB abduction 15x  Pt reviews  with pt importance of performing HEP at recommended frequency in order to benefit from interventions and progress.    PATIENT EDUCATION: Education details: Pt educated throughout session about proper posture and technique with exercises. Improved exercise technique, movement at target joints, use of target muscles after min to mod verbal, visual, tactile cues. Importance of performing HEP at recommended frequency  Person educated: Patient Education method: Explanation Education comprehension: verbalized understanding  HOME EXERCISE PROGRAM: Access Code: [email or web address] URL: [email or web address] Date: 02/01/2023 Prepared by: Temple Pacini  Exercises - Supine Bridge  - 1 x daily - 5 x weekly - 3 sets - 10 reps - Supine Straight Leg Raise  - 1 x daily - 5 x weekly - 2 sets - 10 reps - Clamshell  - 1 x daily - 5 x weekly - 2 sets - 15 reps - Seated Hamstring Stretch  - 1 x daily - 7 x weekly - 1 sets - 2 reps - 30 seconds hold  GOALS: Goals reviewed with patient? Yes     SHORT TERM GOALS: Target date: 03/09/2023   Patient will be independent in home exercise program to improve strength/mobility for better functional independence with ADLs. Baseline:to be initiated Goal status: INITIAL   LONG TERM GOALS: Target date: 04/20/2023   Patient will increase FOTO score to equal to or greater than  54 to demonstrate statistically significant improvement in mobility and quality of life.  Baseline: 44 Goal status: INITIAL  2.  Patient  will complete five times sit to stand test in < 10 seconds indicating an increased LE strength and improved balance. Baseline: 14 seconds, painful Goal status: INITIAL  3.  Patient will increase Berg Balance tandem stance and SLB score by at least 1 pt to demonstrate decreased fall risk during functional activities Baseline: to be completed next 1-2 visits; 3/19: SLB 2/4, tandem 3/4 Goal status: REVISED  4.  Patient will increase 10 meter walk  test to >1.68m/s as to improve gait speed for better community ambulation and to reduce fall risk. Baseline: 0.75 m/s, painful Goal status: INITIAL  6. Patient will reduce modified Oswestry score to <20 as to demonstrate minimal disability with ADLs including improved sleeping tolerance, walking/sitting tolerance etc for better mobility with ADLs.   Baseline: 40% Goal status: INITIAL   ASSESSMENT:  CLINICAL IMPRESSION: Patient has improved control of pelvic tilts and core activation.  Patient tolerates all interventions well with minimal pain increase. She has excellent motivation throughout session. Progression of weights to 2.5 tolerated without pain increase this session indicating LE strengthening. The pt will benefit from further skilled PT to address these deficits in order to improve QOL, functional mobility and to decrease fall risk.   OBJECTIVE IMPAIRMENTS: Abnormal gait, decreased activity tolerance, decreased balance, decreased endurance, decreased mobility, difficulty walking, decreased ROM, decreased strength, improper body mechanics, postural  dysfunction, and pain.   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   REHAB POTENTIAL: Fair    CLINICAL DECISION MAKING: Evolving/moderate complexity  EVALUATION COMPLEXITY: Moderate  PLAN:  PT FREQUENCY: 2x/week  PT DURATION: 12 weeks  PLANNED INTERVENTIONS: Therapeutic exercises, Therapeutic activity, Neuromuscular re-education, Balance training,  Gait training, Patient/Family education, Self Care, Joint mobilization, Joint manipulation, Stair training, Vestibular training, Canalith repositioning, Orthotic/Fit training, DME instructions, Dry Needling, Electrical stimulation, Spinal manipulation, Spinal mobilization, Cryotherapy, Moist heat, Splintting, Taping, Traction, Manual therapy, and Re-evaluation  PLAN FOR NEXT SESSION: mobility, strength, balance   Precious Bard, PT 02/24/2023, 10:59 AM

## 2023-02-24 ENCOUNTER — Ambulatory Visit: Payer: Medicare Other

## 2023-02-24 DIAGNOSIS — M5459 Other low back pain: Secondary | ICD-10-CM | POA: Diagnosis not present

## 2023-02-24 DIAGNOSIS — M6281 Muscle weakness (generalized): Secondary | ICD-10-CM

## 2023-02-24 DIAGNOSIS — R262 Difficulty in walking, not elsewhere classified: Secondary | ICD-10-CM

## 2023-02-24 DIAGNOSIS — R2681 Unsteadiness on feet: Secondary | ICD-10-CM

## 2023-02-28 NOTE — Therapy (Signed)
OUTPATIENT PHYSICAL THERAPY NEURO TREATMENT NOTE   Patient Name: Carla May MRN: [id number] DOB:10/25/1941, 82 y.o., female Today's Date: 03/01/2023   PCP: Ollen Bowl, MD  REFERRING PROVIDER:  Erick Colace, MD     END OF SESSION:  PT End of Session - 03/01/23 1005     Visit Number 9    Number of Visits 25    Date for PT Re-Evaluation 04/20/23    PT Start Time 1012    PT Stop Time 1055    PT Time Calculation (min) 43 min    Equipment Utilized During Treatment Gait belt    Activity Tolerance Patient tolerated treatment well;Patient limited by pain    Behavior During Therapy WFL for tasks assessed/performed                  Past Medical History:  Diagnosis Date   Anxiety    Arthritis    back. neck   Diabetes mellitus without complication    Dysrhythmia    GERD (gastroesophageal reflux disease)    History of palpitations    HOH (hard of hearing) 06/28/2019   Hyperlipidemia    diet controlled and fish oil   MVP (mitral valve prolapse)    never has caused any problems per patient on 06/27/19   PONV (postoperative nausea and vomiting)    No problems in 08/2018; however has had previous problems   Pre-diabetes    diet controlled, no med   Retinal detachment    OU   SVD (spontaneous vaginal delivery)    x 2   Past Surgical History:  Procedure Laterality Date   ABDOMINAL HYSTERECTOMY  1970's   AIR/FLUID EXCHANGE Left 06/28/2019   Procedure: Air/Fluid Exchange;  Surgeon: Rennis Chris, MD;  Location: [facility] OR;  Service: Ophthalmology;  Laterality: Left;   CATARACT EXTRACTION Bilateral    Dr. Vonna Kotyk   COLONOSCOPY     polyp   EYE SURGERY Bilateral    Cat Sx and RD repair   GAS INSERTION Right 11/26/2014   Procedure: INSERTION OF GAS;  Surgeon: Sherrie George, MD;  Location: [facility] OR;  Service: Ophthalmology;  Laterality: Right;  C3F8   GAS/FLUID EXCHANGE Left 08/21/2018   Procedure: GAS/FLUID EXCHANGE;  Surgeon: Rennis Chris, MD;  Location: [facility]  OR;  Service: Ophthalmology;  Laterality: Left;  C3F8   KNEE ARTHROSCOPY Left    LASER PHOTO ABLATION Left 06/28/2019   Procedure: Laser Photo Ablation;  Surgeon: Rennis Chris, MD;  Location: [facility] OR;  Service: Ophthalmology;  Laterality: Left;   PARS PLANA VITRECTOMY Left 06/28/2019   Procedure: PARS PLANA VITRECTOMY WITH 25 GAUGE;  Surgeon: Rennis Chris, MD;  Location: [facility] OR;  Service: Ophthalmology;  Laterality: Left;   PHOTOCOAGULATION WITH LASER Right 11/26/2014   Procedure: PHOTOCOAGULATION WITH LASER;  Surgeon: Sherrie George, MD;  Location: [facility] OR;  Service: Ophthalmology;  Laterality: Right;   PHOTOCOAGULATION WITH LASER Left 08/21/2018   Procedure: PHOTOCOAGULATION WITH LASER;  Surgeon: Rennis Chris, MD;  Location: [facility] OR;  Service: Ophthalmology;  Laterality: Left;   REPAIR OF COMPLEX TRACTION RETINAL DETACHMENT Left 12/06/2018   Procedure: REPAIR OF COMPLEX TRACTION RETINAL DETACHMENT, 25 gauge vitrectomy, endolaser photocoagulation, memebrane peel, perfluoron injection,  and silicone oil;  Surgeon: Rennis Chris, MD;  Location: [facility] OR;  Service: Ophthalmology;  Laterality: Left;   RETINAL DETACHMENT SURGERY Bilateral    SBP OD - Dr. Alan Mulder (1.12.16).  SBP - Dr. Rennis Chris (10.7.19)   SCLERAL BUCKLE Right 11/26/2014  Procedure: SCLERAL BUCKLE RIGHT EYE ;  Surgeon: Sherrie George, MD;  Location: [facility] OR;  Service: Ophthalmology;  Laterality: Right;   SCLERAL BUCKLE WITH POSSIBLE 25 GAUGE PARS PLANA VITRECTOMY Left 08/21/2018   Procedure: SCLERAL BUCKLE WITH 25 GAUGE PARS PLANA VITRECTOMY;  Surgeon: Rennis Chris, MD;  Location: [facility] OR;  Service: Ophthalmology;  Laterality: Left;   SILICON OIL REMOVAL Left 06/28/2019   Procedure: Silicon Oil Removal;  Surgeon: Rennis Chris, MD;  Location: [facility] OR;  Service: Ophthalmology;  Laterality: Left;   TONSILLECTOMY     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   Procedure: TOTAL KNEE ARTHROPLASTY;  Surgeon: Ollen Gross, MD;  Location: [facility] ORS;   Service: Orthopedics;  Laterality: Left;   Patient Active Problem List   Diagnosis Date Noted   Hereditary and idiopathic peripheral neuropathy 11/29/2022   Numbness and tingling of foot 11/29/2022   Chronic bilateral low back pain with right-sided sciatica 09/06/2022   OA (osteoarthritis) of knee 05/03/2022   Osteoarthritis of left knee 05/03/2022   HOH (hard of hearing) 06/28/2019   Rhegmatogenous retinal detachment of right eye 11/26/2014    ONSET DATE: 2021, years ago  REFERRING DIAG: M48.062 (ICD-10-CM) - Spinal stenosis of lumbar region with neurogenic claudication   THERAPY DIAG:  Other low back pain  Muscle weakness (generalized)  Unsteadiness on feet  Difficulty in walking, not elsewhere classified  Rationale for Evaluation and Treatment: Rehabilitation  SUBJECTIVE:                                                                                                                                                                                             SUBJECTIVE STATEMENT: Patient reports back feeling sore after gardening the previous date and needed seated rest breaks. Reports 5/10 pain in back.    Pt accompanied by: self  PERTINENT HISTORY:  The pt is a pleasant 82 yo female referred to PT for lumbar spinal stenosis with neurogenic claudication. Pt describes chronic low back pain with numbness of LLE. Pt reports she has had back pain for years. Her pain is more prominent in her RLE than LLE. She feels pain into her toes,describes it as a numbness, tightness. Pt trying PT first before pursuing injection for her back. She reports back pain is 10+/10. Pt takes Tylenol to manage pain. Currently LBP is impacting her ability to walk, which is limited to 5 minutes. She has difficulty lifting and performing transfers.. She reports poor control of RLE which makes it difficult to get dressed. Pt reports decreased balance. She stumbles when she stands up and has difficulty  walking on uneven surfaces. She  also feels out of breath easily. 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 Yes: NPRS scale: 10+/10 Pain location: LBP, BLE pain (L worse than R) Pain description: tightness, numbness Aggravating factors: movement Relieving factors: Tylenol  PRECAUTIONS: Fall  WEIGHT BEARING RESTRICTIONS: No  FALLS: Has patient fallen in last 6 months? No  LIVING ENVIRONMENT: Lives with: lives with their spouseSingle point cane, Walker - 2 wheeled, and shower chair Lives in: House/apartment Stairs: Yes: External: 2-3 steps; none Has following equipment at home:   PLOF: Independent  PATIENT GOALS: Pt would like to improve RLE flexibility and control, reducing pain, improving walking  OBJECTIVE:   DIAGNOSTIC FINDINGS:   MR lumbar spine 02/27/20 via chart Narrative & Impression  CLINICAL DATA:  Sciatica, unspecified laterality. Sciatica of left side. Additional history provided by technologist: Patient reports low back pain, worse on the left side, cramps and numbness in left foot, symptoms for 2 months.   EXAM: MRI LUMBAR SPINE WITHOUT CONTRAST   TECHNIQUE: Multiplanar, multisequence MR imaging of the lumbar spine was performed. No intravenous contrast was administered.   COMPARISON:  No pertinent prior studies available for comparison.   FINDINGS: Segmentation: For the purposes of this dictation, five lumbar vertebrae are assumed and the caudal most well-formed intervertebral disc is designated L5-S1.   Alignment: Straightening of the expected lumbar lordosis. No significant spondylolisthesis   Vertebrae: Vertebral body height is maintained. Multilevel degenerative endplate irregularity. Trace degenerative endplate edema at the L2-L3, L4-L5 and L5-S1 levels   Conus medullaris and cauda equina:  Conus extends to the L1 level. No signal abnormality within the visualized distal spinal cord.   Paraspinal and other soft tissues: No abnormality identified within included portions of the abdomen/retroperitoneum. Atrophy of the lumbar paraspinal musculature.   Disc levels:   Moderate/advanced disc degeneration at the L2-L3 through L5-S1 levels. Mild disc degeneration at L1-L2.   T12-L1: No disc herniation. No significant canal or foraminal stenosis.   L1-L2: Mild facet arthrosis. No disc herniation. No significant canal or foraminal stenosis.   L2-L3: Disc bulge with circumferential osteophyte ridge. Disc osteophyte ridge is more focally prominent within the left subarticular zone. Mild facet arthrosis/ligamentum flavum hypertrophy. Moderate left subarticular narrowing with crowding of the descending left L3 nerve root (series 5, image 15). Central canal patent. No significant foraminal stenosis.   L3-L4: Disc bulge with endplate spurring. Moderate facet arthrosis/ligamentum flavum hypertrophy (greater on the left). Mild left subarticular narrowing without nerve root impingement. Central canal patent. Mild bilateral neural foraminal narrowing.   L4-L5: Disc bulge with endplate spurring. Superimposed shallow right center/subarticular disc protrusion. Mild facet arthrosis/ligamentum flavum hypertrophy. Moderate right subarticular narrowing with contact upon the descending right L5 nerve root. Central canal patent. Mild right neural foraminal narrowing.   L5-S1: Disc bulge with endplate spurring. Superimposed broad-based right foraminal disc protrusion. Moderate facet arthrosis/ligamentum flavum hypertrophy. Mild bilateral subarticular narrowing (greater on the left) without frank nerve root impingement. Central canal patent. Bilateral neural foraminal narrowing (moderate right, mild left).   IMPRESSION: Lumbar spondylosis as outlined and most notably as follows.    Moderate/advanced disc degeneration at the L2-L3 through L5-S1 levels.   At L2-L3, disc osteophyte ridge contributes to multifactorial moderate left subarticular narrowing with crowding of the descending left L3 nerve root.   At L4-L5, a shallow right center/subarticular disc protrusion contributes to moderate right subarticular narrowing  with contact upon the descending right L5 nerve root. Mild multifactorial right neural foraminal narrowing also present at this level.   At L5-S1, a broad-based disc protrusion contributes to multifactorial moderate right neural foraminal narrowing. Mild bilateral subarticular and left neural foraminal narrowing also present at this level.   Additional sites of mild subarticular and neural foraminal narrowing as described".      COGNITION: Overall cognitive status: Within functional limits for tasks assessed   SENSATION: In tact to light touch BLE wit testing, but does report numbness felt in LLE  COORDINATION:  WNL BLE   EDEMA:  Pt reports mild swelling in BLE (reports doctor aware)   MUSCLE LENGTH: Hamstring length: LLE lacking 17 deg RLE lacking 15 deg   POSTURE: abnormal, noted particularly with standing/gait, pt with R trunk lean,  LOWER EXTREMITY MMT:     BLE musculature generally 4+/5 with the following exceptions:  hip flexors 4-/5 bilat, hip er/ir each 4+/5 B      LUMBAR ROM:   AROM eval  Flexion 80%*  Extension WFL, however painful  Right lateral flexion 90%*  Left lateral flexion 70%*  Right rotation 80%*  Left rotation WFL   (Blank rows = not tested) *=pain limited   Palpation: Assessment performed in seated, pt found to have no tenderness with palpation throughout low back musculature  No tenderness found along T-spine over lumbar spine, however, further assessment with pt in prone needed to assess for mobility    LUMBAR SPECIAL TESTS:  deferred  TRANSFERS: Assistive device utilized: None  Sit to  stand:  hands-free but painful Stand to sit: Complete Independence Chair to chair: Complete Independence   GAIT: Gait pattern:  noted decreased trunk rotation, R lean, decrease gait speed Distance walked: 10 meters, clinic distances  Assistive device utilized: None Level of assistance: SBA  FUNCTIONAL TESTS:  5 times sit to stand: 15 sec hands free but painful in low back and bilat knees : 0.75 m/s no AD, somewhat painful, pt with R trunk lean, decreased trunk rotation  PATIENT SURVEYS:  Modified Oswestry 40%  FOTO 44 Berg: 53/56   TODAY'S TREATMENT:                                                                                                                              DATE:  Heat donned to low back while pt performs therex.  Pt reports heat feels good, skin checked and is WNL, no adverse reaction to heat upon removal of heat.   TherEx:  Seated: GTB hamstring curl x 20 each LE. Reports difficulty with L LE 2/2 stiffness and hx of L TKA  GTB step outs x 15 each LE 3# AW donned each LE -LAQ x 10 with 3 second hold on each LE  -Heel raises x 20 each LE -Toe raises x 20 each LE. Reports medium difficulty  Posterior pelvic tilt x 10  Sit to stand x 5 with no  UE support  GTB hip abduction x 15  Seated Paloff press x 10 each side with GTB Seated opposite arm and leg raises x 10 each side  Physioball rollouts x 10   Supine:  On mat table: heat donned to low back Hamstring lengthening stretch 60 seconds each LE  Hooklying figure 4 stretch 60 seconds each LE  Sciatic nerve floss 15x each LE Knee to chest stretch 60 seconds sec each LE Hooklying trunk rotation x 15 each side  Hooklying adduction ball squeeze x 15 Posterior pelvic tilt x 15 Posterior pelvic tilt x 15 with adduction ball squeeze Green swiss ball TrA activation 15x 3 second 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   PATIENT EDUCATION: Education details: Pt educated throughout session about proper posture and technique with exercises. Improved exercise technique, movement at target joints, use of target muscles after min to mod verbal, visual, tactile cues. Importance of performing HEP at recommended frequency  Person educated: Patient Education method: Explanation Education comprehension: verbalized understanding  HOME EXERCISE PROGRAM: Access Code: [email or web address] URL: [email or web address] Date: 02/01/2023 Prepared by: Temple Pacini  Exercises - Supine Bridge  - 1 x daily - 5 x weekly - 3 sets - 10 reps - Supine Straight Leg Raise  - 1 x daily - 5 x weekly - 2 sets - 10 reps - Clamshell  - 1 x daily - 5 x weekly - 2 sets - 15 reps - Seated Hamstring Stretch  - 1 x daily - 7 x weekly - 1 sets - 2 reps - 30 seconds hold  GOALS: Goals reviewed with patient? Yes     SHORT TERM GOALS: Target date: 03/09/2023   Patient will be independent in home exercise program to improve strength/mobility for better functional independence with ADLs. Baseline:to be initiated Goal status: INITIAL   LONG TERM GOALS: Target date: 04/20/2023   Patient will increase FOTO score to equal to or greater than  54 to demonstrate statistically significant improvement in mobility and quality of life.  Baseline: 44 Goal status: INITIAL  2.  Patient  will complete five times sit to stand test in < 10 seconds indicating an increased LE strength and improved balance. Baseline: 14 seconds, painful Goal status: INITIAL  3.  Patient will increase Berg Balance tandem stance and SLB score by at least 1 pt to demonstrate decreased fall risk during functional activities Baseline: to be completed next 1-2 visits; 3/19: SLB 2/4, tandem 3/4 Goal status: REVISED  4.   Patient will increase 10 meter walk test to >1.44m/s as to improve gait speed for better community ambulation and to reduce fall risk. Baseline: 0.75 m/s, painful Goal status: INITIAL  6. Patient will reduce modified Oswestry score to <20 as to demonstrate minimal disability with ADLs including improved sleeping tolerance, walking/sitting tolerance etc for better mobility with ADLs.   Baseline: 40% Goal status: INITIAL   ASSESSMENT:  CLINICAL IMPRESSION: Patient presents with increased soreness from recent activity on previous date but eager to participate in PT treatment session. Focused on hip/hamstring stretching, core stabilization, and LE strengthening. Able to increase resistance with good tolerance to exercises. Reports reduction in back pain from 5/10 to 2/10 at end of session. The pt will benefit from further  skilled PT to address these deficits in order to improve QOL, functional mobility and to decrease fall risk.   OBJECTIVE IMPAIRMENTS: Abnormal gait, decreased activity tolerance, decreased balance, decreased endurance, decreased mobility, difficulty walking, decreased ROM, decreased strength, improper body mechanics, postural dysfunction, and pain.   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   REHAB POTENTIAL: Fair    CLINICAL DECISION MAKING: Evolving/moderate complexity  EVALUATION COMPLEXITY: Moderate  PLAN:  PT FREQUENCY: 2x/week  PT DURATION: 12 weeks  PLANNED INTERVENTIONS:  Therapeutic exercises, Therapeutic activity, Neuromuscular re-education, Balance training, Gait training, Patient/Family education, Self Care, Joint mobilization, Joint manipulation, Stair training, Vestibular training, Canalith repositioning, Orthotic/Fit training, DME instructions, Dry Needling, Electrical stimulation, Spinal manipulation, Spinal mobilization, Cryotherapy, Moist heat, Splintting, Taping, Traction, Manual therapy, and Re-evaluation  PLAN FOR NEXT SESSION: mobility, strength, balance   Precious Bard, PT 03/01/2023, 10:57 AM

## 2023-03-01 ENCOUNTER — Ambulatory Visit: Payer: Medicare Other

## 2023-03-01 DIAGNOSIS — M6281 Muscle weakness (generalized): Secondary | ICD-10-CM

## 2023-03-01 DIAGNOSIS — R262 Difficulty in walking, not elsewhere classified: Secondary | ICD-10-CM

## 2023-03-01 DIAGNOSIS — R2681 Unsteadiness on feet: Secondary | ICD-10-CM

## 2023-03-01 DIAGNOSIS — M5459 Other low back pain: Secondary | ICD-10-CM

## 2023-03-07 ENCOUNTER — Ambulatory Visit: Payer: Medicare Other

## 2023-03-07 DIAGNOSIS — M6281 Muscle weakness (generalized): Secondary | ICD-10-CM

## 2023-03-07 DIAGNOSIS — M5459 Other low back pain: Secondary | ICD-10-CM | POA: Diagnosis not present

## 2023-03-07 DIAGNOSIS — R262 Difficulty in walking, not elsewhere classified: Secondary | ICD-10-CM

## 2023-03-07 NOTE — Therapy (Signed)
OUTPATIENT PHYSICAL THERAPY NEURO TREATMENT NOTE/Physical Therapy Progress Note   Dates of reporting period  01/26/2023   to   03/07/2023    Patient Name: Carla May MRN: [id number] DOB:1940/11/23, 82 y.o., female Today's Date: 03/07/2023   PCP: Ollen Bowl, MD  REFERRING PROVIDER:  Erick Colace, MD     END OF SESSION:  PT End of Session - 03/07/23 1601     Visit Number 10    Number of Visits 25    Date for PT Re-Evaluation 04/20/23    PT Start Time 1600    PT Stop Time 1644    PT Time Calculation (min) 44 min    Equipment Utilized During Treatment Gait belt    Activity Tolerance Patient tolerated treatment well;Patient limited by pain    Behavior During Therapy WFL for tasks assessed/performed                  Past Medical History:  Diagnosis Date   Anxiety    Arthritis    back. neck   Diabetes mellitus without complication    Dysrhythmia    GERD (gastroesophageal reflux disease)    History of palpitations    HOH (hard of hearing) 06/28/2019   Hyperlipidemia    diet controlled and fish oil   MVP (mitral valve prolapse)    never has caused any problems per patient on 06/27/19   PONV (postoperative nausea and vomiting)    No problems in 08/2018; however has had previous problems   Pre-diabetes    diet controlled, no med   Retinal detachment    OU   SVD (spontaneous vaginal delivery)    x 2   Past Surgical History:  Procedure Laterality Date   ABDOMINAL HYSTERECTOMY  1970's   AIR/FLUID EXCHANGE Left 06/28/2019   Procedure: Air/Fluid Exchange;  Surgeon: Rennis Chris, MD;  Location: [facility] OR;  Service: Ophthalmology;  Laterality: Left;   CATARACT EXTRACTION Bilateral    Dr. Vonna Kotyk   COLONOSCOPY     polyp   EYE SURGERY Bilateral    Cat Sx and RD repair   GAS INSERTION Right 11/26/2014   Procedure: INSERTION OF GAS;  Surgeon: Sherrie George, MD;  Location: [facility] OR;  Service: Ophthalmology;  Laterality: Right;  C3F8   GAS/FLUID EXCHANGE  Left 08/21/2018   Procedure: GAS/FLUID EXCHANGE;  Surgeon: Rennis Chris, MD;  Location: [facility] OR;  Service: Ophthalmology;  Laterality: Left;  C3F8   KNEE ARTHROSCOPY Left    LASER PHOTO ABLATION Left 06/28/2019   Procedure: Laser Photo Ablation;  Surgeon: Rennis Chris, MD;  Location: [facility] OR;  Service: Ophthalmology;  Laterality: Left;   PARS PLANA VITRECTOMY Left 06/28/2019   Procedure: PARS PLANA VITRECTOMY WITH 25 GAUGE;  Surgeon: Rennis Chris, MD;  Location: [facility] OR;  Service: Ophthalmology;  Laterality: Left;   PHOTOCOAGULATION WITH LASER Right 11/26/2014   Procedure: PHOTOCOAGULATION WITH LASER;  Surgeon: Sherrie George, MD;  Location: [facility] OR;  Service: Ophthalmology;  Laterality: Right;   PHOTOCOAGULATION WITH LASER Left 08/21/2018   Procedure: PHOTOCOAGULATION WITH LASER;  Surgeon: Rennis Chris, MD;  Location: [facility] OR;  Service: Ophthalmology;  Laterality: Left;   REPAIR OF COMPLEX TRACTION RETINAL DETACHMENT Left 12/06/2018   Procedure: REPAIR OF COMPLEX TRACTION RETINAL DETACHMENT, 25 gauge vitrectomy, endolaser photocoagulation, memebrane peel, perfluoron injection,  and silicone oil;  Surgeon: Rennis Chris, MD;  Location: [facility] OR;  Service: Ophthalmology;  Laterality: Left;   RETINAL DETACHMENT SURGERY Bilateral    SBP OD -  Dr. Alan Mulder (1.12.16).  SBP - Dr. Rennis Chris (10.7.19)   SCLERAL BUCKLE Right 11/26/2014   Procedure: SCLERAL BUCKLE RIGHT EYE ;  Surgeon: Sherrie George, MD;  Location: [facility] OR;  Service: Ophthalmology;  Laterality: Right;   SCLERAL BUCKLE WITH POSSIBLE 25 GAUGE PARS PLANA VITRECTOMY Left 08/21/2018   Procedure: SCLERAL BUCKLE WITH 25 GAUGE PARS PLANA VITRECTOMY;  Surgeon: Rennis Chris, MD;  Location: [facility] OR;  Service: Ophthalmology;  Laterality: Left;   SILICON OIL REMOVAL Left 06/28/2019   Procedure: Silicon Oil Removal;  Surgeon: Rennis Chris, MD;  Location: [facility] OR;  Service: Ophthalmology;  Laterality: Left;   TONSILLECTOMY     TOTAL KNEE ARTHROPLASTY Left  05/03/2022   Procedure: TOTAL KNEE ARTHROPLASTY;  Surgeon: Ollen Gross, MD;  Location: [facility] ORS;  Service: Orthopedics;  Laterality: Left;   Patient Active Problem List   Diagnosis Date Noted   Hereditary and idiopathic peripheral neuropathy 11/29/2022   Numbness and tingling of foot 11/29/2022   Chronic bilateral low back pain with right-sided sciatica 09/06/2022   OA (osteoarthritis) of knee 05/03/2022   Osteoarthritis of left knee 05/03/2022   HOH (hard of hearing) 06/28/2019   Rhegmatogenous retinal detachment of right eye 11/26/2014    ONSET DATE: 2021, years ago  REFERRING DIAG: M48.062 (ICD-10-CM) - Spinal stenosis of lumbar region with neurogenic claudication   THERAPY DIAG:  Other low back pain  Difficulty in walking, not elsewhere classified  Muscle weakness (generalized)  Rationale for Evaluation and Treatment: Rehabilitation  SUBJECTIVE:                                                                                                                                                                                             SUBJECTIVE STATEMENT: Pt reports she took Tylenol prior to appointment. Pt reports this morning LBP was 8/10. She reports exercises help her loosen up her back. Pt reports pain currently 2/10.   Pt accompanied by: self  PERTINENT HISTORY:  The pt is a pleasant 82 yo female referred to PT for lumbar spinal stenosis with neurogenic claudication. Pt describes chronic low back pain with numbness of LLE. Pt reports she has had back pain for years. Her pain is more prominent in her RLE than LLE. She feels pain into her toes,describes it as a numbness, tightness. Pt trying PT first before pursuing injection for her back. She reports back pain is 10+/10. Pt takes Tylenol to manage pain. Currently LBP is impacting her ability to walk, which is limited to 5 minutes. She has difficulty lifting and performing transfers.. She reports poor control of RLE which  makes  it difficult to get dressed. Pt reports decreased balance. She stumbles when she stands up and has difficulty walking on uneven surfaces. She also feels out of breath easily. 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 Yes: NPRS scale: 10+/10 Pain location: LBP, BLE pain (L worse than R) Pain description: tightness, numbness Aggravating factors: movement Relieving factors: Tylenol  PRECAUTIONS: Fall  WEIGHT BEARING RESTRICTIONS: No  FALLS: Has patient fallen in last 6 months? No  LIVING ENVIRONMENT: Lives with: lives with their spouseSingle point cane, Walker - 2 wheeled, and shower chair Lives in: House/apartment Stairs: Yes: External: 2-3 steps; none Has following equipment at home:   PLOF: Independent  PATIENT GOALS: Pt would like to improve RLE flexibility and control, reducing pain, improving walking  OBJECTIVE:   DIAGNOSTIC FINDINGS:   MR lumbar spine 02/27/20 via chart Narrative & Impression  CLINICAL DATA:  Sciatica, unspecified laterality. Sciatica of left side. Additional history provided by technologist: Patient reports low back pain, worse on the left side, cramps and numbness in left foot, symptoms for 2 months.   EXAM: MRI LUMBAR SPINE WITHOUT CONTRAST   TECHNIQUE: Multiplanar, multisequence MR imaging of the lumbar spine was performed. No intravenous contrast was administered.   COMPARISON:  No pertinent prior studies available for comparison.   FINDINGS: Segmentation: For the purposes of this dictation, five lumbar vertebrae are assumed and the caudal most well-formed intervertebral disc is designated L5-S1.   Alignment: Straightening of the expected lumbar lordosis. No significant spondylolisthesis   Vertebrae: Vertebral body height is maintained. Multilevel degenerative endplate  irregularity. Trace degenerative endplate edema at the L2-L3, L4-L5 and L5-S1 levels   Conus medullaris and cauda equina: Conus extends to the L1 level. No signal abnormality within the visualized distal spinal cord.   Paraspinal and other soft tissues: No abnormality identified within included portions of the abdomen/retroperitoneum. Atrophy of the lumbar paraspinal musculature.   Disc levels:   Moderate/advanced disc degeneration at the L2-L3 through L5-S1 levels. Mild disc degeneration at L1-L2.   T12-L1: No disc herniation. No significant canal or foraminal stenosis.   L1-L2: Mild facet arthrosis. No disc herniation. No significant canal or foraminal stenosis.   L2-L3: Disc bulge with circumferential osteophyte ridge. Disc osteophyte ridge is more focally prominent within the left subarticular zone. Mild facet arthrosis/ligamentum flavum hypertrophy. Moderate left subarticular narrowing with crowding of the descending left L3 nerve root (series 5, image 15). Central canal patent. No significant foraminal stenosis.   L3-L4: Disc bulge with endplate spurring. Moderate facet arthrosis/ligamentum flavum hypertrophy (greater on the left). Mild left subarticular narrowing without nerve root impingement. Central canal patent. Mild bilateral neural foraminal narrowing.   L4-L5: Disc bulge with endplate spurring. Superimposed shallow right center/subarticular disc protrusion. Mild facet arthrosis/ligamentum flavum hypertrophy. Moderate right subarticular narrowing with contact upon the descending right L5 nerve root. Central canal patent. Mild right neural foraminal narrowing.   L5-S1: Disc bulge with endplate spurring. Superimposed broad-based right foraminal disc protrusion. Moderate facet arthrosis/ligamentum flavum hypertrophy. Mild bilateral subarticular narrowing (greater on the left) without frank nerve root impingement. Central canal patent. Bilateral neural foraminal  narrowing (moderate right, mild left).   IMPRESSION: Lumbar spondylosis as outlined and most notably as follows.   Moderate/advanced disc degeneration at the L2-L3 through L5-S1 levels.   At L2-L3, disc osteophyte ridge contributes to multifactorial moderate left subarticular narrowing with crowding  of the descending left L3 nerve root.   At L4-L5, a shallow right center/subarticular disc protrusion contributes to moderate right subarticular narrowing with contact upon the descending right L5 nerve root. Mild multifactorial right neural foraminal narrowing also present at this level.   At L5-S1, a broad-based disc protrusion contributes to multifactorial moderate right neural foraminal narrowing. Mild bilateral subarticular and left neural foraminal narrowing also present at this level.   Additional sites of mild subarticular and neural foraminal narrowing as described".      COGNITION: Overall cognitive status: Within functional limits for tasks assessed   SENSATION: In tact to light touch BLE wit testing, but does report numbness felt in LLE  COORDINATION:  WNL BLE   EDEMA:  Pt reports mild swelling in BLE (reports doctor aware)   MUSCLE LENGTH: Hamstring length: LLE lacking 17 deg RLE lacking 15 deg   POSTURE: abnormal, noted particularly with standing/gait, pt with R trunk lean,  LOWER EXTREMITY MMT:     BLE musculature generally 4+/5 with the following exceptions:  hip flexors 4-/5 bilat, hip er/ir each 4+/5 B      LUMBAR ROM:   AROM eval  Flexion 80%*  Extension WFL, however painful  Right lateral flexion 90%*  Left lateral flexion 70%*  Right rotation 80%*  Left rotation WFL   (Blank rows = not tested) *=pain limited   Palpation: Assessment performed in seated, pt found to have no tenderness with palpation throughout low back musculature  No tenderness found along T-spine over lumbar spine, however, further assessment with pt in prone  needed to assess for mobility    LUMBAR SPECIAL TESTS:  deferred  TRANSFERS: Assistive device utilized: None  Sit to stand:  hands-free but painful Stand to sit: Complete Independence Chair to chair: Complete Independence   GAIT: Gait pattern:  noted decreased trunk rotation, R lean, decrease gait speed Distance walked: 10 meters, clinic distances  Assistive device utilized: None Level of assistance: SBA  FUNCTIONAL TESTS:  5 times sit to stand: 15 sec hands free but painful in low back and bilat knees : 0.75 m/s no AD, somewhat painful, pt with R trunk lean, decreased trunk rotation  PATIENT SURVEYS:  Modified Oswestry 40%  FOTO 44 Berg: 53/56   TODAY'S TREATMENT:                                                                                                                              DATE:  TherAct: Goal retesting completed on this date. PT instructed pt through technique and indications of tests.   TherEx:  Recommendation to try sleeping with small pillow under bilat knees for low back relief.   Physioball rollouts FWD/BCKWD/LTL (each direction) x 10-15 each Seated hamstring stretch 60 sec each LE Seated figure-four stretch 60 sec each LE Retro-Gait 10 meters. Terminated due to increased pain   Seated: 3# AW donned each LE -LAQ 2x 10. Rates medium Hamstring curls with RTB  2x10 each LE    PATIENT EDUCATION: Education details: Goal reassessment, plan  Person educated: Patient Education method: Explanation Education comprehension: verbalized understanding  HOME EXERCISE PROGRAM: Access Code: [email or web address] URL: [email or web address] Date: 02/01/2023 Prepared by: Temple Pacini  Exercises - Supine Bridge  - 1 x daily - 5 x weekly - 3 sets - 10 reps - Supine Straight Leg Raise  - 1 x daily - 5 x weekly - 2 sets - 10 reps - Clamshell  - 1 x daily - 5 x weekly - 2 sets - 15 reps - Seated Hamstring Stretch  - 1 x daily - 7 x weekly - 1 sets -  2 reps - 30 seconds hold  GOALS: Goals reviewed with patient? Yes     SHORT TERM GOALS: Target date: 03/09/2023   Patient will be independent in home exercise program to improve strength/mobility for better functional independence with ADLs. Baseline:to be initiated; 03/07/2023: Pt performing HEP, HEP to be advanced as appropriate Goal status: IN PROGRESS   LONG TERM GOALS: Target date: 04/20/2023   Patient will increase FOTO score to equal to or greater than  54 to demonstrate statistically significant improvement in mobility and quality of life.  Baseline: 44; 03/07/2023: 50  Goal status: Partially MET  2.  Patient  will complete five times sit to stand test in < 10 seconds indicating an increased LE strength and improved balance. Baseline: 14 seconds, painful; 03/07/23 12 seconds, no back pain,but does feel bilat knee pain Goal status: PARTIALLY MET  3.  Patient will increase Berg Balance tandem stance and SLB score by at least 1 pt to demonstrate decreased fall risk during functional activities Baseline: to be completed next 1-2 visits; 3/19: SLB 2/4, tandem 3/4'; 03/07/23: tandem 4/4, SLB 3/4  Goal status: MET  4.  Patient will increase 10 meter walk test to >1.15m/s as to improve gait speed for better community ambulation and to reduce fall risk. Baseline: 0.75 m/s, painful; 03/07/2023:  1.04 m/s pain-free  Goal status: MET  6. Patient will reduce modified Oswestry score to <20 as to demonstrate minimal disability with ADLs including improved sleeping tolerance, walking/sitting tolerance etc for better mobility with ADLs.   Baseline: 40%; 03/07/2023: 44%  Goal status: IN PROGRESS   ASSESSMENT:  CLINICAL IMPRESSION: Goals reassessed for progress note. Pt making functional gains AEB meeting balance and goal (completing without pain), and by partially meeting FOTO and 5xSTS goal. However, pt with worse score on modified Oswestry indicating severe disability due to low back  pain. Although pt making functional gains and improvements in QOL (per FOTO), her primary concern of back pain remains significant with little change since starting PT. Pt has follow-up appointment with her MD tomorrow to pursue injection to address ongoing low back pain. PT recommends that pt discuss PT with her MD tomorrow to determine if pt is to continue or hold therapy. Pt verbalized understanding and is agreeable to plan. The pt will benefit from further skilled PT to address these deficits in order to improve QOL, functional mobility and to decrease fall risk.   OBJECTIVE IMPAIRMENTS: Abnormal gait, decreased activity tolerance, decreased balance, decreased endurance, decreased mobility, difficulty walking, decreased ROM, decreased strength, improper body mechanics, postural dysfunction, and pain.   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   REHAB POTENTIAL: Fair    CLINICAL DECISION MAKING: Evolving/moderate complexity  EVALUATION COMPLEXITY: Moderate  PLAN:  PT FREQUENCY: 2x/week  PT DURATION: 12 weeks  PLANNED INTERVENTIONS: Therapeutic exercises, Therapeutic activity, Neuromuscular re-education, Balance training, Gait training, Patient/Family education, Self Care, Joint mobilization, Joint manipulation, Stair training, Vestibular training, Canalith repositioning, Orthotic/Fit training, DME instructions, Dry Needling, Electrical stimulation, Spinal manipulation, Spinal mobilization, Cryotherapy, Moist  heat, Splintting, Taping, Traction, Manual therapy, and Re-evaluation  PLAN FOR NEXT SESSION: mobility, strength, balance  Temple Pacini PT, DPT  03/07/2023, 4:59 PM

## 2023-03-08 ENCOUNTER — Encounter: Payer: Self-pay | Admitting: Physical Medicine & Rehabilitation

## 2023-03-08 ENCOUNTER — Encounter (HOSPITAL_BASED_OUTPATIENT_CLINIC_OR_DEPARTMENT_OTHER): Payer: Medicare Other | Admitting: Physical Medicine & Rehabilitation

## 2023-03-08 VITALS — BP 115/63 | HR 75 | Ht 65.0 in | Wt 167.6 lb

## 2023-03-08 DIAGNOSIS — M48062 Spinal stenosis, lumbar region with neurogenic claudication: Secondary | ICD-10-CM | POA: Diagnosis not present

## 2023-03-08 DIAGNOSIS — M5441 Lumbago with sciatica, right side: Secondary | ICD-10-CM | POA: Diagnosis not present

## 2023-03-08 NOTE — Progress Notes (Signed)
Subjective:    Patient ID: Carla May, female    DOB: 08/24/1941, 82 y.o.   MRN: [id number]  HPI CC:  RIght foot numbness    81 year old female with chronic low back and leg pain.  She was seen in clinic about 2 months ago referred by Dr. Shearon Stalls.  The patient has had overall improvement in her pain levels.  She states that she was started on gabapentin by Dr. Shearon Stalls which has been helpful in alleviating the lower extremity pain.  Legs get tired easily but no worsening weakness .   In terms of the back pain it is mainly below the waist.  It is mainly right-sided.  Therapy does not seem to be making much impact on this.  She does have additional therapy scheduled through the beginning of July.   Pain Inventory Average Pain 7 Pain Right Now 5 My pain is intermittent, dull, and aching  In the last 24 hours, has pain interfered with the following? General activity 6 Relation with others 0 Enjoyment of life 2 What TIME of day is your pain at its worst? morning  Sleep (in general) Fair  Pain is worse with: bending, inactivity, and standing Pain improves with: medication Relief from Meds: 7  Family History  Problem Relation Age of Onset   Cancer Mother    Heart failure Father    Social History   Socioeconomic History   Marital status: Divorced    Spouse name: Not on file   Number of children: Not on file   Years of education: Not on file   Highest education level: Not on file  Occupational History   Not on file  Tobacco Use   Smoking status: Former    Packs/day: 0.50    Years: 10.00    Additional pack years: 0.00    Total pack years: 5.00    Types: Cigarettes    Quit date: 1970    Years since quitting: 54.3   Smokeless tobacco: Never   Tobacco comments:    quit in 1970's  Vaping Use   Vaping Use: Never used  Substance and Sexual Activity   Alcohol use: No   Drug use: No   Sexual activity: Not on file    Comment: Hysterectomy  Other Topics Concern   Not  on file  Social History Narrative   Not on file   Social Determinants of Health   Financial Resource Strain: Not on file  Food Insecurity: Not on file  Transportation Needs: Not on file  Physical Activity: Not on file  Stress: Not on file  Social Connections: Not on file   Past Surgical History:  Procedure Laterality Date   ABDOMINAL HYSTERECTOMY  1970's   AIR/FLUID EXCHANGE Left 06/28/2019   Procedure: Air/Fluid Exchange;  Surgeon: Rennis Chris, MD;  Location: [facility] OR;  Service: Ophthalmology;  Laterality: Left;   CATARACT EXTRACTION Bilateral    Dr. Vonna Kotyk   COLONOSCOPY     polyp   EYE SURGERY Bilateral    Cat Sx and RD repair   GAS INSERTION Right 11/26/2014   Procedure: INSERTION OF GAS;  Surgeon: Sherrie George, MD;  Location: [facility] OR;  Service: Ophthalmology;  Laterality: Right;  C3F8   GAS/FLUID EXCHANGE Left 08/21/2018   Procedure: GAS/FLUID EXCHANGE;  Surgeon: Rennis Chris, MD;  Location: [facility] OR;  Service: Ophthalmology;  Laterality: Left;  C3F8   KNEE ARTHROSCOPY Left    LASER PHOTO ABLATION Left 06/28/2019   Procedure: Laser Photo Ablation;  Surgeon: Rennis Chris, MD;  Location: [facility] OR;  Service: Ophthalmology;  Laterality: Left;   PARS PLANA VITRECTOMY Left 06/28/2019   Procedure: PARS PLANA VITRECTOMY WITH 25 GAUGE;  Surgeon: Rennis Chris, MD;  Location: [facility] OR;  Service: Ophthalmology;  Laterality: Left;   PHOTOCOAGULATION WITH LASER Right 11/26/2014   Procedure: PHOTOCOAGULATION WITH LASER;  Surgeon: Sherrie George, MD;  Location: [facility] OR;  Service: Ophthalmology;  Laterality: Right;   PHOTOCOAGULATION WITH LASER Left 08/21/2018   Procedure: PHOTOCOAGULATION WITH LASER;  Surgeon: Rennis Chris, MD;  Location: [facility] OR;  Service: Ophthalmology;  Laterality: Left;   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 Alan Mulder (1.12.16).  SBP - Dr. Rennis Chris (10.7.19)   SCLERAL BUCKLE Right 11/26/2014   Procedure: SCLERAL BUCKLE RIGHT EYE ;  Surgeon: Sherrie George, MD;  Location: [facility] OR;  Service: Ophthalmology;  Laterality: Right;   SCLERAL BUCKLE WITH POSSIBLE 25 GAUGE PARS PLANA VITRECTOMY Left 08/21/2018   Procedure: SCLERAL BUCKLE WITH 25 GAUGE PARS PLANA VITRECTOMY;  Surgeon: Rennis Chris, MD;  Location: [facility] OR;  Service: Ophthalmology;  Laterality: Left;   SILICON OIL REMOVAL Left 06/28/2019   Procedure: Silicon Oil Removal;  Surgeon: Rennis Chris, MD;  Location: [facility] OR;  Service: Ophthalmology;  Laterality: Left;   TONSILLECTOMY     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   Procedure: TOTAL KNEE ARTHROPLASTY;  Surgeon: Ollen Gross, MD;  Location: [facility] ORS;  Service: Orthopedics;  Laterality: Left;   Past Surgical History:  Procedure Laterality Date   ABDOMINAL HYSTERECTOMY  1970's   AIR/FLUID EXCHANGE Left 06/28/2019   Procedure: Air/Fluid Exchange;  Surgeon: Rennis Chris, MD;  Location: [facility] OR;  Service: Ophthalmology;  Laterality: Left;   CATARACT EXTRACTION Bilateral    Dr. Vonna Kotyk   COLONOSCOPY     polyp   EYE SURGERY Bilateral    Cat Sx and RD repair   GAS INSERTION Right 11/26/2014   Procedure: INSERTION OF GAS;  Surgeon: Sherrie George, MD;  Location: [facility] OR;  Service: Ophthalmology;  Laterality: Right;  C3F8   GAS/FLUID EXCHANGE Left 08/21/2018   Procedure: GAS/FLUID EXCHANGE;  Surgeon: Rennis Chris, MD;  Location: [facility] OR;  Service: Ophthalmology;  Laterality: Left;  C3F8   KNEE ARTHROSCOPY Left    LASER PHOTO ABLATION Left 06/28/2019   Procedure: Laser Photo Ablation;  Surgeon: Rennis Chris, MD;  Location: [facility] OR;  Service: Ophthalmology;  Laterality: Left;   PARS PLANA VITRECTOMY Left 06/28/2019   Procedure: PARS PLANA  VITRECTOMY WITH 25 GAUGE;  Surgeon: Rennis Chris, MD;  Location: [facility] OR;  Service: Ophthalmology;  Laterality: Left;   PHOTOCOAGULATION WITH LASER Right 11/26/2014   Procedure: PHOTOCOAGULATION WITH LASER;  Surgeon: Sherrie George, MD;  Location: [facility] OR;  Service: Ophthalmology;  Laterality: Right;   PHOTOCOAGULATION WITH LASER Left 08/21/2018   Procedure: PHOTOCOAGULATION WITH LASER;  Surgeon: Rennis Chris, MD;  Location: 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 Alan Mulder (1.12.16).  SBP - Dr. Rennis Chris (10.7.19)   SCLERAL BUCKLE Right 11/26/2014   Procedure: SCLERAL BUCKLE RIGHT EYE ;  Surgeon: Sherrie George, MD;  Location: [facility] OR;  Service: Ophthalmology;  Laterality: Right;   SCLERAL BUCKLE WITH POSSIBLE 25 GAUGE PARS PLANA VITRECTOMY Left 08/21/2018   Procedure: SCLERAL BUCKLE WITH 25 GAUGE PARS PLANA VITRECTOMY;  Surgeon: Rennis Chris, MD;  Location: [facility] OR;  Service: Ophthalmology;  Laterality: Left;   SILICON OIL REMOVAL Left 06/28/2019   Procedure: Silicon Oil Removal;  Surgeon: Rennis Chris, MD;  Location: [facility] OR;  Service: Ophthalmology;  Laterality: Left;   TONSILLECTOMY     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   Procedure: TOTAL KNEE ARTHROPLASTY;  Surgeon: Ollen Gross, MD;  Location: [facility] ORS;  Service: Orthopedics;  Laterality: Left;   Past Medical History:  Diagnosis Date   Anxiety    Arthritis    back. neck   Diabetes mellitus without complication    Dysrhythmia    GERD (gastroesophageal reflux disease)    History of palpitations    HOH (hard of hearing) 06/28/2019   Hyperlipidemia     diet controlled and fish oil   MVP (mitral valve prolapse)    never has caused any problems per patient on 06/27/19   PONV (postoperative nausea and vomiting)    No problems in 08/2018; however has had previous problems   Pre-diabetes    diet controlled, no med   Retinal detachment    OU   SVD (spontaneous vaginal delivery)    x 2   BP 115/63   Pulse 75   Ht 5\' 5"  (1.651 m)   Wt 167 lb 9.6 oz (76 kg)   LMP  (LMP Unknown)   SpO2 97%   BMI 27.89 kg/m   Opioid Risk Score:   Fall Risk Score:  `1  Depression screen [facility] 2/9     01/11/2023   10:35 AM 09/06/2022   10:38 AM 04/08/2020   11:52 AM  Depression screen PHQ 2/9  Decreased Interest 0 0 0  Down, Depressed, Hopeless 0 0 0  PHQ - 2 Score 0 0 0  Altered sleeping  1   Tired, decreased energy  2   Change in appetite  1   Feeling bad or failure about yourself   1   Trouble concentrating  0   Moving slowly or fidgety/restless  0   Suicidal thoughts  0   PHQ-9 Score  5      Review of Systems  Musculoskeletal:  Positive for back pain.  All other systems reviewed and are negative.     Objective:   Physical Exam  Negative straight leg raising test bilaterally Sensation to pinprick is equal bilateral L3-L4-L5 S1 distributions.  There is a patch on her left lateral leg that feels a little less than the right side but she does not experience any pain in this leg. Negative Faber's test Negative thigh thrust test Negative lateral compression test negative distraction test  Motor strength is 5/5 bilateral hip flexor knee extensor ankle dorsiflexor Ambulates without assist device no evidence of drag or instability.     Assessment & Plan:   #1.  Lumbar spinal stenosis multifactorial she has lumbar disc degeneration as well as lumbar spondylosis.  Her radicular symptoms have improved since starting the gabapentin.  No focal neurologic signs on the right more symptomatic side.  Back pain is mainly right sided below the waist.   SI provocation tests are negative.  This could represent facet arthrosis at the L5-S1 level.  Would recommending continuing PT.  The patient will follow-up with Dr. Shearon Stalls.  If her pain persists or worsens may consider facet medial branch blocks on the right side if it mainly is axial ill pain or if the radicular pain worsens despite gabapentin would consider L5-1 transforaminal to address L5 nerve root compression   Reviewed images of her last MRI dated 02/26/2020.

## 2023-03-09 ENCOUNTER — Ambulatory Visit: Payer: Medicare Other

## 2023-03-14 ENCOUNTER — Ambulatory Visit: Payer: Medicare Other

## 2023-03-16 ENCOUNTER — Ambulatory Visit: Payer: Medicare Other

## 2023-03-22 ENCOUNTER — Ambulatory Visit: Payer: Medicare Other

## 2023-03-24 ENCOUNTER — Ambulatory Visit: Payer: Medicare Other

## 2023-03-28 ENCOUNTER — Ambulatory Visit: Payer: Medicare Other

## 2023-03-30 ENCOUNTER — Ambulatory Visit: Payer: Medicare Other

## 2023-04-05 ENCOUNTER — Ambulatory Visit: Payer: Medicare Other

## 2023-04-06 ENCOUNTER — Other Ambulatory Visit (INDEPENDENT_AMBULATORY_CARE_PROVIDER_SITE_OTHER): Payer: Self-pay

## 2023-04-06 MED ORDER — BROMFENAC SODIUM 0.07 % OP SOLN: 1.0000 [drp] | Freq: Three times a day (TID) | OPHTHALMIC | 3 refills | Status: DC

## 2023-04-07 ENCOUNTER — Ambulatory Visit: Payer: Medicare Other

## 2023-04-12 ENCOUNTER — Ambulatory Visit: Payer: Medicare Other

## 2023-04-18 ENCOUNTER — Ambulatory Visit: Payer: Medicare Other

## 2023-04-20 ENCOUNTER — Ambulatory Visit: Payer: Medicare Other

## 2023-04-26 ENCOUNTER — Ambulatory Visit: Payer: Medicare Other

## 2023-04-26 NOTE — Progress Notes (Signed)
[facility] - Clinic Note  05/10/2023     CHIEF COMPLAINT Patient presents for Retina Follow Up  HISTORY OF PRESENT ILLNESS: Carla May is a 82 y.o. female who presents to the clinic today for:   HPI     Retina Follow Up   In left eye.  This started 3 months ago.  Duration of 3 months.  Since onset it is stable.  I, the attending physician,  performed the HPI with the patient and updated documentation appropriately.        Comments   3 month retina follow up CME OS pt is reporting no vision changes noticed she has some floaters but denies any flashes of light       Last edited by Rennis Chris, MD on 05/10/2023  1:11 PM.    Patient is using Pred or Prolensa OS TID. She is having a hard time getting her Prolensa filled.  Referring physician: Ollen Bowl, MD [address] [place],  [place] [address]  HISTORICAL INFORMATION:   Selected notes from the MEDICAL RECORD NUMBER Referred by Dr. Ashok Cordia for concern of retinal hole.   CURRENT MEDICATIONS: Current Outpatient Medications (Ophthalmic Drugs)  Medication Sig   Bromfenac Sodium 0.07 % SOLN Place 1 drop into the left eye in the morning, at noon, and at bedtime.   Carboxymethylcellulose Sod PF (REFRESH PLUS) 0.5 % SOLN as directed Ophthalmic   prednisoLONE acetate (PRED FORTE) 1 % ophthalmic suspension place 1 drop into L eye Ophthalmic Twice a day for 30   No current facility-administered medications for this visit. (Ophthalmic Drugs)   Current Outpatient Medications (Other)  Medication Sig   acetaminophen (TYLENOL 8 HOUR ARTHRITIS PAIN) 650 MG CR tablet 1 tablet as needed Orally as needed   ascorbic acid (VITAMIN C) 500 MG tablet 1 tablet Orally Once a day   aspirin EC 81 MG tablet Take 81 mg by mouth daily. Swallow whole.   b complex vitamins capsule Take 1 capsule by mouth daily.   CALCIUM-MAGNESUIUM-ZINC 333-133-8.3 MG TABS 1 tablet with a meal Orally Once a day    ezetimibe (ZETIA) 10 MG tablet Take by mouth.   gabapentin (NEURONTIN) 300 MG capsule Take 1 capsule (300 mg total) by mouth at bedtime.   Icosapent Ethyl (VASCEPA PO) 2 capsules by mouth twice a day with food   icosapent Ethyl (VASCEPA) 1 g capsule Take 2 g by mouth 2 (two) times daily.   meloxicam (MOBIC) 15 MG tablet Take 15 mg by mouth daily.   Multiple Vitamin (MULTIVITAMINS PO) 1 tablet Orally once a day   Omega-3 Fatty Acids (OMEGA III EPA+DHA) 1000 MG CAPS Take by mouth.   omeprazole (PRILOSEC OTC) 20 MG tablet 1 capsule Orally Once a day   Current Facility-Administered Medications (Other)  Medication Route   triamcinolone acetonide (KENALOG-40) injection 4 mg Intravitreal   REVIEW OF SYSTEMS: ROS   Positive for: Gastrointestinal, Neurological, Musculoskeletal, HENT, Eyes Negative for: Constitutional, Skin, Genitourinary, Endocrine, Cardiovascular, Respiratory, Psychiatric, Allergic/Imm, Heme/Lymph Last edited by Etheleen Mayhew, COT on 05/10/2023  9:51 AM.       ALLERGIES Allergies  Allergen Reactions   Atorvastatin Other (See Comments)    Muscle aches/ memory loss  Other Reaction(s): muscle aches/memory, Myalgias   Ezetimibe     Other Reaction(s): myalgias   Pravastatin     Other Reaction(s): myalgias   Simvastatin     Other Reaction(s): increased liver fxn  Penicillins Itching, Nausea Only and Nausea And Vomiting    Did it involve swelling of the face/tongue/throat, SOB, or low BP? Unknown  Did it involve sudden or severe rash/hives, skin peeling, or any reaction on the inside of your mouth or nose? Itching  Did you need to seek medical attention at a hospital or doctor's office? No  When did it last happen? 1970s  If all above answers are "NO", may proceed with cephalosporin use.  Tolerated Cephalosporin Date: 05/04/22.  Other reaction(s): Not available  Other Reaction(s): GI Intolerance   Sulfa Antibiotics Nausea Only and Other (See Comments)    Sulfasalazine Other (See Comments)   PAST MEDICAL HISTORY Past Medical History:  Diagnosis Date   Anxiety    Arthritis    back. neck   Diabetes mellitus without complication (HCC)    Dysrhythmia    GERD (gastroesophageal reflux disease)    History of palpitations    HOH (hard of hearing) 06/28/2019   Hyperlipidemia    diet controlled and fish oil   MVP (mitral valve prolapse)    never has caused any problems per patient on 06/27/19   PONV (postoperative nausea and vomiting)    No problems in 08/2018; however has had previous problems   Pre-diabetes    diet controlled, no med   Retinal detachment    OU   SVD (spontaneous vaginal delivery)    x 2   Past Surgical History:  Procedure Laterality Date   ABDOMINAL HYSTERECTOMY  1970's   AIR/FLUID EXCHANGE Left 06/28/2019   Procedure: Air/Fluid Exchange;  Surgeon: Rennis Chris, MD;  Location: [facility] OR;  Service: Ophthalmology;  Laterality: Left;   CATARACT EXTRACTION Bilateral    Dr. Vonna Kotyk   COLONOSCOPY     polyp   EYE SURGERY Bilateral    Cat Sx and RD repair   GAS INSERTION Right 11/26/2014   Procedure: INSERTION OF GAS;  Surgeon: Sherrie George, MD;  Location: [facility] OR;  Service: Ophthalmology;  Laterality: Right;  C3F8   GAS/FLUID EXCHANGE Left 08/21/2018   Procedure: GAS/FLUID EXCHANGE;  Surgeon: Rennis Chris, MD;  Location[facility] OR;  Service: Ophthalmology;  Laterality: Left;  C3F8   KNEE ARTHROSCOPY Left    LASER PHOTO ABLATION Left 06/28/2019   Procedure: Laser Photo Ablation;  Surgeon: Rennis Chris, MD;  Location: [facility] OR;  Service: Ophthalmology;  Laterality: Left;   PARS PLANA VITRECTOMY Left 06/28/2019   Procedure: PARS PLANA VITRECTOMY WITH 25 GAUGE;  Surgeon: Rennis Chris, MD;  Location: [facility] OR;  Service: Ophthalmology;  Laterality: Left;   PHOTOCOAGULATION WITH LASER Right 11/26/2014   Procedure: PHOTOCOAGULATION WITH LASER;  Surgeon: Sherrie George, MD;  Location: [facility] OR;  Service: Ophthalmology;  Laterality: Right;    PHOTOCOAGULATION WITH LASER Left 08/21/2018   Procedure: PHOTOCOAGULATION WITH LASER;  Surgeon: Rennis Chris, MD;  Location: [facility] OR;  Service: Ophthalmology;  Laterality: Left;   REPAIR OF COMPLEX TRACTION RETINAL DETACHMENT Left 12/06/2018   Procedure: REPAIR OF COMPLEX TRACTION RETINAL DETACHMENT, 25 gauge vitrectomy, endolaser photocoagulation, memebrane peel, perfluoron injection,  and silicone oil;  Surgeon: Rennis Chris, MD;  Location: [facility] OR;  Service: Ophthalmology;  Laterality: Left;   RETINAL DETACHMENT SURGERY Bilateral    SBP OD - Dr. Alan Mulder (1.12.16).  SBP - Dr. Rennis Chris (10.7.19)   SCLERAL BUCKLE Right 11/26/2014   Procedure: SCLERAL BUCKLE RIGHT EYE ;  Surgeon: Sherrie George, MD;  Location: [facility] OR;  Service: Ophthalmology;  Laterality: Right;   SCLERAL BUCKLE  WITH POSSIBLE 25 GAUGE PARS PLANA VITRECTOMY Left 08/21/2018   Procedure: SCLERAL BUCKLE WITH 25 GAUGE PARS PLANA VITRECTOMY;  Surgeon: Rennis Chris, MD;  Location: [facility] OR;  Service: Ophthalmology;  Laterality: Left;   SILICON OIL REMOVAL Left 06/28/2019   Procedure: Silicon Oil Removal;  Surgeon: Rennis Chris, MD;  Location: [facility] OR;  Service: Ophthalmology;  Laterality: Left;   TONSILLECTOMY     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   Procedure: TOTAL KNEE ARTHROPLASTY;  Surgeon: Ollen Gross, MD;  Location: [facility] ORS;  Service: Orthopedics;  Laterality: Left;   FAMILY HISTORY Family History  Problem Relation Age of Onset   Cancer Mother    Heart failure Father    SOCIAL HISTORY Social History   Tobacco Use   Smoking status: Former    Packs/day: 0.50    Years: 10.00    Additional pack years: 0.00    Total pack years: 5.00    Types: Cigarettes    Quit date: 1970    Years since quitting: 54.5   Smokeless tobacco: Never   Tobacco comments:    quit in 1970's  Vaping Use   Vaping Use: Never used  Substance Use Topics   Alcohol use: No   Drug use: No       OPHTHALMIC EXAM:  Base Eye Exam      Visual Acuity (Snellen - Linear)       Right Left   Dist cc 20/50 -2 20/60   Dist ph cc 20/40 -2 20/50         Tonometry (Tonopen, 9:57 AM)       Right Left   Pressure 13 13         Pupils       Pupils Dark Light Shape React APD   Right PERRL 3 2 Round Brisk None   Left PERRL 3 2 Round Brisk None         Visual Fields       Left Right    Full Full         Extraocular Movement       Right Left    Full, Ortho Full, Ortho         Neuro/Psych     Oriented x3: Yes   Mood/Affect: Normal         Dilation     Both eyes: 2.5% Phenylephrine @ 9:57 AM           Slit Lamp and Fundus Exam     External Exam       Right Left   External Normal Normal         Slit Lamp Exam       Right Left   Lids/Lashes Dermatochalasis - upper lid, Meibomian gland dysfunction Dermatochalasis - upper lid, Meibomian gland dysfunction   Conjunctiva/Sclera White and quiet White and quiet   Cornea Trace Punctate epithelial erosions, Temporal Well healed cataract wounds, mild arcus, trace tear film debris Debris in tear film, + mucus   Anterior Chamber deep, 0.5+pigment Deep, 3+cell and pigment, Flare   Iris Round and dilated round and poorly dilated   Lens PC IOL in good position with open PC PC IOL in good position with open PC   Anterior Vitreous Vitreous syneresis, mild pigment in anterior vitreous, Posterior vitreous detachment; scattered vitreous debris Post vitrectomy, 1-2+cell/pigment         Fundus Exam       Right Left   Disc Pink and Sharp, Compact Sharp  rim, mild tilt, trace temporal pallor, temporal Peripapillary atrophy, Compact   C/D Ratio 0.2 0.3   Macula Flat, Good foveal reflex, Retinal pigment epithelial mottling, ERM, No heme or edema Flat, blunted foveal reflex, +epiretinal membrane, mild Retinal pigment epithelial mottling and clumping, cystic changes -- stably improved   Vessels attenuated, Tortuous attenuated, Tortuous   Periphery Attached  over scleral buckle, good buckle height, good scarring over buckle Retina attached over buckle; good buckle height; good laser surrounding buckle w/ new row posterior to buckle; +fibrosis at 0300 over buckle; retinotomy from 0300-0430 with good laser surrounding           Refraction     Wearing Rx       Sphere Cylinder Axis   Right -2.75 +1.75 157   Left -4.25 +1.75 117           IMAGING AND PROCEDURES  Imaging and Procedures for @TODAY @  OCT, Retina - OU - Both Eyes       Right Eye Quality was good. Central Foveal Thickness: 289. Progression has been stable. Findings include normal foveal contour, no IRF, no SRF, epiretinal membrane (Mild ERM -- stable).   Left Eye Quality was good. Central Foveal Thickness: 276. Progression has been stable. Findings include normal foveal contour, no IRF, no SRF, epiretinal membrane (stable improvement in cystic changes / IRF; Persistent ERM, mild vitreous opacities).   Notes *Images captured and stored on drive  Diagnosis / Impression:  OD: mild ERM; NFP; no IRF/SRF-stable from prior OS: stable improvement in cystic changes / IRF; Persistent ERM, mild vitreous opacities  Clinical management:  See below  Abbreviations: NFP - Normal foveal profile. CME - cystoid macular edema. PED - pigment epithelial detachment. IRF - intraretinal fluid. SRF - subretinal fluid. EZ - ellipsoid zone. ERM - epiretinal membrane. ORA - outer retinal atrophy. ORT - outer retinal tubulation. SRHM - subretinal hyper-reflective material            ASSESSMENT/PLAN:    ICD-10-CM   1. CME (cystoid macular edema), left  H35.352 OCT, Retina - OU - Both Eyes    2. 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 History of retinal detachment  Z86.69     5. Pseudophakia of both eyes  Z96.1     6. Dry eyes  H04.123      1. CME OS   - history of recurrent post-op CME - s/p sub-tenons kenalog (03.13.20) - interval  increase / redevelopment of cystic changes OS on 12.09.21 and more recently on 12.20.22 -- restarted Prolensa QID OS (was still on PF QID OS) - pt follows with Dr. Vincente Poli, who she saw in April 2023, he tapered PF to BID OS and Prolensa to once a day.  - Prolensa increased back to BID per Dr. Vanessa Barbara (05.09.23),  - PF and Prolensa increased to 3-4/day (06.09.23), then back to BID on 07.11.23 - pt last saw Grewal on 04.18.24 -- he instructed her to use PF/Prolensa BID -- pt still using both TID - BCVA OS 20/50 - OCT shows stable improvement in IRF/cystic changes (CME improved); Persistent ERM, mild vitreous opacities  - exam shows focal injection ST quadrant conj/sclera OS; +AC and anterior vit cell - recommend PF and Prolensa TID OS, okay to try BID but go back to TID if notices a change.  - discussed possible need for low dose maintenance therapy to keep CME and  inflammation reduced  - f/u here in 4-6 months -- DFE/OCT  2. Retinal detachment, OS             - originally: macula-sparing inferior retinal detachment from 3-6 oclock             - large HST at 0500 and 2 small tears at 300 within the detached retina             - s/p SBP + PPV/PFC/EL/FAX/14% C3F8 OS, 10.07.19 - progressive fibrosis/PVR just posterior to buckle around 0400 with +SRF tracking posterior to buckle -- focal tractional/PVR detachment OS             - s/p PPV/MP/PFO/retinotomy/EL/1000cs silicon oil OS, 1.22.2020  - s/p 25g PPV with silicon oil removal (08.13.20) - retina attached and in good position -- good buckle height and laser over buckle and around retinotomy             - IOP okay at 13 today  - BCVA 20/50 as discussed above   3. Nodular scleritis OS -- recurrent, but currently quiescent - history of recurrent episodes following the tapering of meds (PO pred and topical PF/Prolensa)  - today (4.4.23) -- interval redevelopment of focal nodular scleritis in ST quad             - limited lab work to rule out  rheumatologic cause--all results negative   CBC, CMP   HIV   HLA Panel   ANA   ANCA   ACE, Lysozyme   RF   ESR, CRP - today, inflammatory nodule stably improved  4. History of retinal detachment OD  - S/P SBP OD 11/26/14 -- Dr. Alan Mulder  - looks great -- retina in great position  - monitor  5. Pseudophakia OU  - s/p CE/IOL OU (Bevis)  - doing well  - monitor  6. Dry eyes OU - recommend lubricating ointment at night along with artificial tears     Ophthalmic Meds Ordered this visit:  No orders of the defined types were placed in this encounter.    Return in about 5 months (around 10/10/2023) for f/u CME OS , DFE, OCT.  There are no Patient Instructions on file for this visit.  This document serves as a record of services personally performed by Karie Chimera, MD, PhD. It was created on their behalf by Gerilyn Nestle, COT an ophthalmic technician. The creation of this record is the provider's dictation and/or activities during the visit.    Electronically signed by:  Gerilyn Nestle, COT  6.11.24 1:12 PM  This document serves as a record of services personally performed by Karie Chimera, MD, PhD. It was created on their behalf by Gerilyn Nestle, COT an ophthalmic technician. The creation of this record is the provider's dictation and/or activities during the visit.    Electronically signed by:  Charlette Caffey, COT  05/10/23 1:12 PM  Karie Chimera, M.D., Ph.D. Diseases & Surgery of the Retina and Vitreous [facility]  I have reviewed the above documentation for accuracy and completeness, and I agree with the above. Karie Chimera, M.D., Ph.D. 05/10/23 1:15 PM  Abbreviations: M myopia (nearsighted); A astigmatism; H hyperopia (farsighted); P presbyopia; Mrx spectacle prescription;  CTL contact lenses; OD right eye; OS left eye; OU both eyes  XT exotropia; ET esotropia; PEK punctate epithelial keratitis; PEE punctate  epithelial erosions; DES dry eye syndrome; MGD meibomian gland dysfunction; ATs artificial tears; PFAT's  preservative free artificial tears; [place]vin nuclear sclerotic cataract; PSC posterior subcapsular cataract; ERM epi-retinal membrane; PVD posterior vitreous detachment; RD retinal detachment; DM diabetes mellitus; DR diabetic retinopathy; NPDR non-proliferative diabetic retinopathy; PDR proliferative diabetic retinopathy; CSME clinically significant macular edema; DME diabetic macular edema; dbh dot blot hemorrhages; CWS cotton wool spot; POAG primary open angle glaucoma; C/D cup-to-disc ratio; HVF humphrey visual field; GVF goldmann visual field; OCT optical coherence tomography; IOP intraocular pressure; BRVO Branch retinal vein occlusion; CRVO central retinal vein occlusion; CRAO central retinal artery occlusion; BRAO branch retinal artery occlusion; RT retinal tear; SB scleral buckle; PPV pars plana vitrectomy; VH Vitreous hemorrhage; PRP panretinal laser photocoagulation; IVK intravitreal kenalog; VMT vitreomacular traction; MH Macular hole;  NVD neovascularization of the disc; NVE neovascularization elsewhere; AREDS age related eye disease study; ARMD age related macular degeneration; POAG primary open angle glaucoma; EBMD epithelial/anterior basement membrane dystrophy; ACIOL anterior chamber intraocular lens; IOL intraocular lens; PCIOL posterior chamber intraocular lens; Phaco/IOL phacoemulsification with intraocular lens placement; [place] photorefractive keratectomy; LASIK laser assisted in situ keratomileusis; HTN hypertension; DM diabetes mellitus; COPD chronic obstructive pulmonary disease

## 2023-04-28 ENCOUNTER — Ambulatory Visit: Payer: Medicare Other

## 2023-05-05 ENCOUNTER — Ambulatory Visit: Payer: Medicare Other

## 2023-05-09 ENCOUNTER — Ambulatory Visit: Payer: Medicare Other

## 2023-05-10 ENCOUNTER — Encounter (INDEPENDENT_AMBULATORY_CARE_PROVIDER_SITE_OTHER): Payer: Self-pay | Admitting: Ophthalmology

## 2023-05-10 ENCOUNTER — Ambulatory Visit (INDEPENDENT_AMBULATORY_CARE_PROVIDER_SITE_OTHER): Payer: Medicare Other | Admitting: Ophthalmology

## 2023-05-10 DIAGNOSIS — H04123 Dry eye syndrome of bilateral lacrimal glands: Secondary | ICD-10-CM

## 2023-05-10 DIAGNOSIS — H35352 Cystoid macular degeneration, left eye: Secondary | ICD-10-CM | POA: Diagnosis not present

## 2023-05-10 DIAGNOSIS — H15092 Other scleritis, left eye: Secondary | ICD-10-CM

## 2023-05-10 DIAGNOSIS — Z961 Presence of intraocular lens: Secondary | ICD-10-CM

## 2023-05-10 DIAGNOSIS — Z8669 Personal history of other diseases of the nervous system and sense organs: Secondary | ICD-10-CM

## 2023-05-10 DIAGNOSIS — H3322 Serous retinal detachment, left eye: Secondary | ICD-10-CM

## 2023-05-11 ENCOUNTER — Ambulatory Visit: Payer: Medicare Other

## 2023-05-16 ENCOUNTER — Encounter: Payer: Medicare Other | Admitting: Physical Medicine and Rehabilitation

## 2023-05-16 ENCOUNTER — Ambulatory Visit: Payer: Medicare Other

## 2023-05-17 ENCOUNTER — Encounter (INDEPENDENT_AMBULATORY_CARE_PROVIDER_SITE_OTHER): Payer: Medicare Other | Admitting: Ophthalmology

## 2023-05-18 ENCOUNTER — Ambulatory Visit: Payer: Medicare Other

## 2023-05-24 ENCOUNTER — Ambulatory Visit: Payer: Medicare Other

## 2023-05-25 ENCOUNTER — Other Ambulatory Visit (INDEPENDENT_AMBULATORY_CARE_PROVIDER_SITE_OTHER): Payer: Self-pay | Admitting: Ophthalmology

## 2023-05-26 ENCOUNTER — Ambulatory Visit: Payer: Medicare Other

## 2023-05-31 ENCOUNTER — Ambulatory Visit: Payer: Medicare Other

## 2023-06-02 ENCOUNTER — Ambulatory Visit: Payer: Medicare Other

## 2023-06-07 ENCOUNTER — Ambulatory Visit: Payer: Medicare Other

## 2023-06-09 ENCOUNTER — Ambulatory Visit: Payer: Medicare Other

## 2023-06-14 ENCOUNTER — Ambulatory Visit: Payer: Medicare Other

## 2023-06-16 ENCOUNTER — Ambulatory Visit: Payer: Medicare Other

## 2023-06-21 ENCOUNTER — Ambulatory Visit: Payer: Medicare Other

## 2023-06-23 ENCOUNTER — Ambulatory Visit: Payer: Medicare Other

## 2023-06-28 ENCOUNTER — Ambulatory Visit: Payer: Medicare Other

## 2023-06-30 ENCOUNTER — Ambulatory Visit: Payer: Medicare Other

## 2023-07-05 ENCOUNTER — Ambulatory Visit: Payer: Medicare Other

## 2023-07-07 ENCOUNTER — Ambulatory Visit: Payer: Medicare Other

## 2023-07-29 ENCOUNTER — Telehealth (INDEPENDENT_AMBULATORY_CARE_PROVIDER_SITE_OTHER): Payer: Self-pay

## 2023-08-01 ENCOUNTER — Ambulatory Visit (INDEPENDENT_AMBULATORY_CARE_PROVIDER_SITE_OTHER): Payer: Medicare Other | Admitting: Ophthalmology

## 2023-08-01 ENCOUNTER — Encounter (INDEPENDENT_AMBULATORY_CARE_PROVIDER_SITE_OTHER): Payer: Self-pay | Admitting: Ophthalmology

## 2023-08-01 DIAGNOSIS — H3322 Serous retinal detachment, left eye: Secondary | ICD-10-CM

## 2023-08-01 DIAGNOSIS — H35352 Cystoid macular degeneration, left eye: Secondary | ICD-10-CM

## 2023-08-01 DIAGNOSIS — H15092 Other scleritis, left eye: Secondary | ICD-10-CM

## 2023-08-01 DIAGNOSIS — H04123 Dry eye syndrome of bilateral lacrimal glands: Secondary | ICD-10-CM

## 2023-08-01 DIAGNOSIS — H209 Unspecified iridocyclitis: Secondary | ICD-10-CM | POA: Diagnosis not present

## 2023-08-01 DIAGNOSIS — Z8669 Personal history of other diseases of the nervous system and sense organs: Secondary | ICD-10-CM

## 2023-08-01 DIAGNOSIS — Z961 Presence of intraocular lens: Secondary | ICD-10-CM

## 2023-08-01 MED ORDER — PREDNISOLONE ACETATE 1 % OP SUSP: 1.0000 [drp] | OPHTHALMIC | 3 refills | Status: DC

## 2023-08-01 MED ORDER — BROMFENAC SODIUM 0.07 % OP SOLN: 1.0000 [drp] | Freq: Four times a day (QID) | OPHTHALMIC | 3 refills | Status: AC

## 2023-08-01 NOTE — Progress Notes (Signed)
[facility] - Clinic Note  08/01/2023     CHIEF COMPLAINT Patient presents for Retina Follow Up  HISTORY OF PRESENT ILLNESS: Carla May is a 82 y.o. female who presents to the clinic today for:   HPI     Retina Follow Up   Patient presents with  Other.  In left eye.  Severity is moderate.  Duration of 1 week.  Since onset it is gradually worsening.  I, the attending physician,  performed the HPI with the patient and updated documentation appropriately.        Comments   Pt here for decrease VA OS x 1 week, gradual. Pt states her eye became somewhat blurry 1 week ago, gradually getting worse. Pt reports taking PF and Prolensa BID OS, Refresh tears multiple times per day OU.       Last edited by Rennis Chris, MD on 08/02/2023  2:13 AM.    Patient is here urgently for decreased VA OS, she feels like all last week her left eye vision was blurry, she feels like it started in the corner of her eye, she states if she has both eyes open, it bothers her right eye vision as well, she states she cannot read with her left eye at all, she is using PF and Prolensa BID OS, pt does not recall hitting her eye or getting anything in it, pt states her arthritis has been bothering her lately, she only takes Tylenol and gabapentin for arthritis pain  Referring physician: Ollen Bowl, MD [address] [place],  [place] [address]  HISTORICAL INFORMATION:   Selected notes from the MEDICAL RECORD NUMBER Referred by Dr. Ashok Cordia for concern of retinal hole.   CURRENT MEDICATIONS: Current Outpatient Medications (Ophthalmic Drugs)  Medication Sig   Carboxymethylcellulose Sod PF (REFRESH PLUS) 0.5 % SOLN as directed Ophthalmic   prednisoLONE acetate (PRED FORTE) 1 % ophthalmic suspension place 1 drop into L eye Ophthalmic Twice a day for 30   prednisoLONE acetate (PRED FORTE) 1 % ophthalmic suspension Place 1 drop into the left eye every hour while awake.    Bromfenac Sodium 0.07 % SOLN Place 1 drop into the left eye 4 (four) times daily.   No current facility-administered medications for this visit. (Ophthalmic Drugs)   Current Outpatient Medications (Other)  Medication Sig   acetaminophen (TYLENOL 8 HOUR ARTHRITIS PAIN) 650 MG CR tablet 1 tablet as needed Orally as needed   ascorbic acid (VITAMIN C) 500 MG tablet 1 tablet Orally Once a day   aspirin EC 81 MG tablet Take 81 mg by mouth daily. Swallow whole.   b complex vitamins capsule Take 1 capsule by mouth daily.   gabapentin (NEURONTIN) 300 MG capsule Take 1 capsule (300 mg total) by mouth at bedtime.   Icosapent Ethyl (VASCEPA PO) 2 capsules by mouth twice a day with food   icosapent Ethyl (VASCEPA) 1 g capsule Take 2 g by mouth 2 (two) times daily.   Multiple Vitamin (MULTIVITAMINS PO) 1 tablet Orally once a day   Omega-3 Fatty Acids (OMEGA III EPA+DHA) 1000 MG CAPS Take by mouth.   omeprazole (PRILOSEC OTC) 20 MG tablet 1 capsule Orally Once a day   CALCIUM-MAGNESUIUM-ZINC 333-133-8.3 MG TABS 1 tablet with a meal Orally Once a day   ezetimibe (ZETIA) 10 MG tablet Take by mouth.   meloxicam (MOBIC) 15 MG tablet Take 15 mg by mouth daily.   Current Facility-Administered Medications (Other)  Medication Route   triamcinolone acetonide (KENALOG-40) injection 4 mg Intravitreal   REVIEW OF SYSTEMS: ROS   Positive for: Gastrointestinal, Neurological, Musculoskeletal, HENT, Eyes Negative for: Constitutional, Skin, Genitourinary, Endocrine, Cardiovascular, Respiratory, Psychiatric, Allergic/Imm, Heme/Lymph Last edited by Thompson Grayer, COT on 08/01/2023  8:11 AM.     ALLERGIES Allergies  Allergen Reactions   Atorvastatin Other (See Comments)    Muscle aches/ memory loss  Other Reaction(s): muscle aches/memory, Myalgias   Ezetimibe     Other Reaction(s): myalgias   Pravastatin     Other Reaction(s): myalgias   Simvastatin     Other Reaction(s): increased liver fxn    Penicillins Itching, Nausea Only and Nausea And Vomiting    Did it involve swelling of the face/tongue/throat, SOB, or low BP? Unknown  Did it involve sudden or severe rash/hives, skin peeling, or any reaction on the inside of your mouth or nose? Itching  Did you need to seek medical attention at a hospital or doctor's office? No  When did it last happen? 1970s  If all above answers are "NO", may proceed with cephalosporin use.  Tolerated Cephalosporin Date: 05/04/22.  Other reaction(s): Not available  Other Reaction(s): GI Intolerance   Sulfa Antibiotics Nausea Only and Other (See Comments)   Sulfasalazine Other (See Comments)   PAST MEDICAL HISTORY Past Medical History:  Diagnosis Date   Anxiety    Arthritis    back. neck   Diabetes mellitus without complication (HCC)    Dysrhythmia    GERD (gastroesophageal reflux disease)    History of palpitations    HOH (hard of hearing) 06/28/2019   Hyperlipidemia    diet controlled and fish oil   MVP (mitral valve prolapse)    never has caused any problems per patient on 06/27/19   PONV (postoperative nausea and vomiting)    No problems in 08/2018; however has had previous problems   Pre-diabetes    diet controlled, no med   Retinal detachment    OU   SVD (spontaneous vaginal delivery)    x 2   Past Surgical History:  Procedure Laterality Date   ABDOMINAL HYSTERECTOMY  1970's   AIR/FLUID EXCHANGE Left 06/28/2019   Procedure: Air/Fluid Exchange;  Surgeon: Rennis Chris, MD;  Location: [facility] OR;  Service: Ophthalmology;  Laterality: Left;   CATARACT EXTRACTION Bilateral    Dr. Vonna Kotyk   COLONOSCOPY     polyp   EYE SURGERY Bilateral    Cat Sx and RD repair   GAS INSERTION Right 11/26/2014   Procedure: INSERTION OF GAS;  Surgeon: Sherrie George, MD;  Location: [facility] OR;  Service: Ophthalmology;  Laterality: Right;  C3F8   GAS/FLUID EXCHANGE Left 08/21/2018   Procedure: GAS/FLUID EXCHANGE;  Surgeon: Rennis Chris, MD;  Location: [facility]  OR;  Service: Ophthalmology;  Laterality: Left;  C3F8   KNEE ARTHROSCOPY Left    LASER PHOTO ABLATION Left 06/28/2019   Procedure: Laser Photo Ablation;  Surgeon: Rennis Chris, MD;  Location: [facility] OR;  Service: Ophthalmology;  Laterality: Left;   PARS PLANA VITRECTOMY Left 06/28/2019   Procedure: PARS PLANA VITRECTOMY WITH 25 GAUGE;  Surgeon: Rennis Chris, MD;  Location: [facility] OR;  Service: Ophthalmology;  Laterality: Left;   PHOTOCOAGULATION WITH LASER Right 11/26/2014   Procedure: PHOTOCOAGULATION WITH LASER;  Surgeon: Sherrie George, MD;  Location: [facility] OR;  Service: Ophthalmology;  Laterality: Right;   PHOTOCOAGULATION WITH LASER Left 08/21/2018   Procedure: PHOTOCOAGULATION WITH LASER;  Surgeon: Rennis Chris, MD;  Location: [facility]  OR;  Service: Ophthalmology;  Laterality: Left;   REPAIR OF COMPLEX TRACTION RETINAL DETACHMENT Left 12/06/2018   Procedure: REPAIR OF COMPLEX TRACTION RETINAL DETACHMENT, 25 gauge vitrectomy, endolaser photocoagulation, memebrane peel, perfluoron injection,  and silicone oil;  Surgeon: Rennis Chris, MD;  Location: [facility] OR;  Service: Ophthalmology;  Laterality: Left;   RETINAL DETACHMENT SURGERY Bilateral    SBP OD - Dr. Alan Mulder (1.12.16).  SBP - Dr. Rennis Chris (10.7.19)   SCLERAL BUCKLE Right 11/26/2014   Procedure: SCLERAL BUCKLE RIGHT EYE ;  Surgeon: Sherrie George, MD;  Location: [facility] OR;  Service: Ophthalmology;  Laterality: Right;   SCLERAL BUCKLE WITH POSSIBLE 25 GAUGE PARS PLANA VITRECTOMY Left 08/21/2018   Procedure: SCLERAL BUCKLE WITH 25 GAUGE PARS PLANA VITRECTOMY;  Surgeon: Rennis Chris, MD;  Location: 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   Procedure: TOTAL KNEE ARTHROPLASTY;  Surgeon: Ollen Gross, MD;  Location: [facility] ORS;   Service: Orthopedics;  Laterality: Left;   FAMILY HISTORY Family History  Problem Relation Age of Onset   Cancer Mother    Heart failure Father    SOCIAL HISTORY Social History   Tobacco Use   Smoking status: Former    Current packs/day: 0.00    Average packs/day: 0.5 packs/day for 10.0 years (5.0 ttl pk-yrs)    Types: Cigarettes    Start date: 89    Quit date: 15    Years since quitting: 54.7   Smokeless tobacco: Never   Tobacco comments:    quit in 1970's  Vaping Use   Vaping status: Never Used  Substance Use Topics   Alcohol use: No   Drug use: No       OPHTHALMIC EXAM:  Base Eye Exam     Visual Acuity (Snellen - Linear)       Right Left   Dist cc 20/40 +1 20/150   Dist ph cc 20/30 -2 NI    Correction: Glasses         Tonometry (Tonopen, 8:19 AM)       Right Left   Pressure 10 13         Pupils       Pupils Dark Light Shape React APD   Right PERRL 3 2 Round Brisk None   Left PERRL 3 2 Round Brisk None         Visual Fields (Counting fingers)       Left Right    Full Full         Extraocular Movement       Right Left    Full, Ortho Full, Ortho         Neuro/Psych     Oriented x3: Yes   Mood/Affect: Normal         Dilation     Both eyes: 1.0% Mydriacyl, 2.5% Phenylephrine @ 8:20 AM           Slit Lamp and Fundus Exam     External Exam       Right Left   External Normal Normal         Slit Lamp Exam       Right Left   Lids/Lashes Dermatochalasis - upper lid Dermatochalasis - upper lid   Conjunctiva/Sclera White  and quiet Trace perilimbal Injection   Cornea Temporal Well healed cataract wounds, mild arcus, tear film debris arcus, trace PEE, fine KP, no nodule   Anterior Chamber deep and clear Deep, 4+fine cell and pigment   Iris Round and dilated round and poorly dilated to 3.75   Lens PC IOL in good position with open PC PC IOL in good position with open PC   Anterior Vitreous Vitreous syneresis,  mild pigment in anterior vitreous, Posterior vitreous detachment; scattered vitreous debris Post vitrectomy, fine +cell/pigment         Fundus Exam       Right Left   Disc Pink and Sharp, Compact Hazy view, Sharp rim, mild tilt, trace temporal pallor, temporal Peripapillary atrophy, Compact   C/D Ratio 0.2 0.3   Macula Flat, Good foveal reflex, Retinal pigment epithelial mottling, ERM, No heme or edema Hazy view, Flat, blunted foveal reflex, +epiretinal membrane, mild Retinal pigment epithelial mottling and clumping, cystic changes -- stably improved   Vessels attenuated, Tortuous Hazy view, attenuated, Tortuous   Periphery Attached over scleral buckle, good buckle height, good scarring over buckle Retina attached over buckle; good buckle height; good laser surrounding buckle w/ new row posterior to buckle; +fibrosis at 0300 over buckle; retinotomy from 0300-0430 with good laser surrounding           Refraction     Wearing Rx       Sphere Cylinder Axis   Right -2.75 +1.75 157   Left -4.25 +1.75 117         Manifest Refraction       Sphere Cylinder Axis Dist VA   Right       Left -3.75 +1.00 120 20/100+1           IMAGING AND PROCEDURES  Imaging and Procedures for @TODAY @  OCT, Retina - OU - Both Eyes       Right Eye Quality was good. Central Foveal Thickness: 295. Progression has been stable. Findings include normal foveal contour, no IRF, no SRF, epiretinal membrane (Mild ERM -- stable).   Left Eye Quality was poor. Central Foveal Thickness: 313. Progression has been stable. Findings include normal foveal contour, no IRF, no SRF, epiretinal membrane (stable improvement in cystic changes / IRF; Persistent ERM, mild vitreous opacities).   Notes *Images captured and stored on drive  Diagnosis / Impression:  OD: mild ERM; NFP; no IRF/SRF-stable from prior OS: stable improvement in cystic changes / IRF; Persistent ERM, mild vitreous opacities  Clinical  management:  See below  Abbreviations: NFP - Normal foveal profile. CME - cystoid macular edema. PED - pigment epithelial detachment. IRF - intraretinal fluid. SRF - subretinal fluid. EZ - ellipsoid zone. ERM - epiretinal membrane. ORA - outer retinal atrophy. ORT - outer retinal tubulation. SRHM - subretinal hyper-reflective material            ASSESSMENT/PLAN:    ICD-10-CM   1. Iritis of left eye  H20.9     2. CME (cystoid macular edema), left  H35.352 OCT, Retina - OU - Both Eyes    3. 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 History of retinal detachment  Z86.69     6. Pseudophakia of both eyes  Z96.1     7. Dry eyes  H04.123      **pt here acutely due to decreased VA OS for the past week**  1. Iritis /  pigment dispersion OS  - BCVA OS 20/150 from 20/50  - exam shows 4+ fine cell/pigment and fine KP  - increase PF to Q1H and Prolensa QID OS  - suggest taking Meloxicam once a day as well -- pt has it for her arthritis  - f/u 2 weeks  2. CME OS   - history of recurrent post-op CME - s/p sub-tenons kenalog (03.13.20) - interval increase / redevelopment of cystic changes OS on 12.09.21 and more recently on 12.20.22 -- restarted Prolensa QID OS (was still on PF QID OS) - pt follows with Dr. Vincente Poli, who she saw in April 2023, he tapered PF to BID OS and Prolensa to once a day.  - Prolensa increased back to BID per Dr. Vanessa Barbara (05.09.23),  - PF and Prolensa increased to 3-4/day (06.09.23), then back to BID on 07.11.23 - pt last saw Grewal on 04.18.24 -- he instructed her to use PF/Prolensa BID -- pt still using both TID - BCVA OS 20/150 - OCT shows stable improvement in IRF/cystic changes (CME improved); Persistent ERM, mild vitreous opacities - discussed possible need for low dose maintenance therapy to keep CME and inflammation reduced - increasing PF and prolensa for iritis as above  - f/u here in 2 wks -- DFE/OCT  3.  Retinal detachment, OS             - originally: macula-sparing inferior retinal detachment from 3-6 oclock             - large HST at 0500 and 2 small tears at 300 within the detached retina             - s/p SBP + PPV/PFC/EL/FAX/14% C3F8 OS, 10.07.19 - progressive fibrosis/PVR just posterior to buckle around 0400 with +SRF tracking posterior to buckle -- focal tractional/PVR detachment OS             - s/p PPV/MP/PFO/retinotomy/EL/1000cs silicon oil OS, 1.22.2020  - s/p 25g PPV with silicon oil removal (08.13.20) - retina attached and in good position -- good buckle height and laser over buckle and around retinotomy             - IOP okay at 13 today  - BCVA 20/150 as discussed above   4. Nodular scleritis OS -- recurrent, but currently quiescent - history of recurrent episodes following the tapering of meds (PO pred and topical PF/Prolensa)  - today (4.4.23) -- interval redevelopment of focal nodular scleritis in ST quad             - limited lab work to rule out rheumatologic cause--all results negative   CBC, CMP   HIV   HLA Panel   ANA   ANCA   ACE, Lysozyme   RF   ESR, CRP - today, inflammatory nodule stably improved  5. History of retinal detachment OD  - S/P SBP OD 11/26/14 -- Dr. Alan Mulder  - looks great -- retina in great position  - monitor  6. Pseudophakia OU  - s/p CE/IOL OU (Bevis)  - doing well  - monitor  7. Dry eyes OU - recommend lubricating ointment at night along with artificial tears     Ophthalmic Meds Ordered this visit:  Meds ordered this encounter  Medications   prednisoLONE acetate (PRED FORTE) 1 % ophthalmic suspension    Sig: Place 1 drop into the left eye every hour while awake.    Dispense:  15 mL    Refill:  3   Bromfenac  Sodium 0.07 % SOLN    Sig: Place 1 drop into the left eye 4 (four) times daily.    Dispense:  3 mL    Refill:  3     Return in about 2 weeks (around 08/15/2023) for f/u inflammation OS, DFE, OCT.  There are no  Patient Instructions on file for this visit.  This document serves as a record of services personally performed by Karie Chimera, MD, PhD. It was created on their behalf by Glee Arvin. Manson Passey, OA an ophthalmic technician. The creation of this record is the provider's dictation and/or activities during the visit.    Electronically signed by: Glee Arvin. Manson Passey, OA 08/02/23 2:16 AM  Karie Chimera, M.D., Ph.D. Diseases & Surgery of the Retina and Vitreous [facility]  I have reviewed the above documentation for accuracy and completeness, and I agree with the above. Karie Chimera, M.D., Ph.D. 08/02/23 2:25 AM   Abbreviations: M myopia (nearsighted); A astigmatism; H hyperopia (farsighted); P presbyopia; Mrx spectacle prescription;  CTL contact lenses; OD right eye; OS left eye; OU both eyes  XT exotropia; ET esotropia; PEK punctate epithelial keratitis; PEE punctate epithelial erosions; DES dry eye syndrome; MGD meibomian gland dysfunction; ATs artificial tears; PFAT's preservative free artificial tears; NSC nuclear sclerotic cataract; PSC posterior subcapsular cataract; ERM epi-retinal membrane; PVD posterior vitreous detachment; RD retinal detachment; DM diabetes mellitus; DR diabetic retinopathy; NPDR non-proliferative diabetic retinopathy; PDR proliferative diabetic retinopathy; CSME clinically significant macular edema; DME diabetic macular edema; dbh dot blot hemorrhages; CWS cotton wool spot; POAG primary open angle glaucoma; C/D cup-to-disc ratio; HVF humphrey visual field; GVF goldmann visual field; OCT optical coherence tomography; IOP intraocular pressure; BRVO Branch retinal vein occlusion; CRVO central retinal vein occlusion; CRAO central retinal artery occlusion; BRAO branch retinal artery occlusion; RT retinal tear; SB scleral buckle; PPV pars plana vitrectomy; VH Vitreous hemorrhage; PRP panretinal laser photocoagulation; IVK intravitreal kenalog; VMT vitreomacular  traction; MH Macular hole;  NVD neovascularization of the disc; NVE neovascularization elsewhere; AREDS age related eye disease study; ARMD age related macular degeneration; POAG primary open angle glaucoma; EBMD epithelial/anterior basement membrane dystrophy; ACIOL anterior chamber intraocular lens; IOL intraocular lens; PCIOL posterior chamber intraocular lens; Phaco/IOL phacoemulsification with intraocular lens placement; PRK photorefractive keratectomy; LASIK laser assisted in situ keratomileusis; HTN hypertension; DM diabetes mellitus; COPD chronic obstructive pulmonary disease

## 2023-08-02 ENCOUNTER — Encounter (INDEPENDENT_AMBULATORY_CARE_PROVIDER_SITE_OTHER): Payer: Self-pay | Admitting: Ophthalmology

## 2023-08-15 ENCOUNTER — Encounter (INDEPENDENT_AMBULATORY_CARE_PROVIDER_SITE_OTHER): Payer: Self-pay | Admitting: Ophthalmology

## 2023-08-15 ENCOUNTER — Encounter (INDEPENDENT_AMBULATORY_CARE_PROVIDER_SITE_OTHER): Payer: Medicare Other | Admitting: Ophthalmology

## 2023-08-15 ENCOUNTER — Ambulatory Visit (INDEPENDENT_AMBULATORY_CARE_PROVIDER_SITE_OTHER): Payer: Medicare Other | Admitting: Ophthalmology

## 2023-08-15 DIAGNOSIS — H209 Unspecified iridocyclitis: Secondary | ICD-10-CM | POA: Diagnosis not present

## 2023-08-15 DIAGNOSIS — H35352 Cystoid macular degeneration, left eye: Secondary | ICD-10-CM | POA: Diagnosis not present

## 2023-08-15 DIAGNOSIS — H3322 Serous retinal detachment, left eye: Secondary | ICD-10-CM

## 2023-08-15 DIAGNOSIS — H04123 Dry eye syndrome of bilateral lacrimal glands: Secondary | ICD-10-CM

## 2023-08-15 DIAGNOSIS — H15092 Other scleritis, left eye: Secondary | ICD-10-CM

## 2023-08-15 DIAGNOSIS — Z8669 Personal history of other diseases of the nervous system and sense organs: Secondary | ICD-10-CM

## 2023-08-15 DIAGNOSIS — Z961 Presence of intraocular lens: Secondary | ICD-10-CM

## 2023-08-15 NOTE — Progress Notes (Signed)
[facility] - Clinic Note  08/15/2023     CHIEF COMPLAINT Patient presents for Retina Follow Up  HISTORY OF PRESENT ILLNESS: Carla May is a 82 y.o. female who presents to the clinic today for:   HPI     Retina Follow Up   In left eye.  This started 2 weeks ago.  Duration of 2 weeks.  Since onset it is stable.        Comments   2 week retina follow up inflammation OS pt states vision seems stable she is seeing more floaters in OS denies any flashes she is using pred every hour and prolensa qid OS       Last edited by Etheleen Mayhew, COT on 08/15/2023  2:39 PM.     Patient feels the vision in the left eye is improving. She is using the eye drops in her left eye.   Referring physician: Ollen Bowl, MD [address] [place],  [place] [address]  HISTORICAL INFORMATION:   Selected notes from the MEDICAL RECORD NUMBER Referred by Dr. Ashok Cordia for concern of retinal hole.   CURRENT MEDICATIONS: Current Outpatient Medications (Ophthalmic Drugs)  Medication Sig   Bromfenac Sodium 0.07 % SOLN Place 1 drop into the left eye 4 (four) times daily.   Carboxymethylcellulose Sod PF (REFRESH PLUS) 0.5 % SOLN as directed Ophthalmic   prednisoLONE acetate (PRED FORTE) 1 % ophthalmic suspension place 1 drop into L eye Ophthalmic Twice a day for 30   prednisoLONE acetate (PRED FORTE) 1 % ophthalmic suspension Place 1 drop into the left eye every hour while awake.   No current facility-administered medications for this visit. (Ophthalmic Drugs)   Current Outpatient Medications (Other)  Medication Sig   acetaminophen (TYLENOL 8 HOUR ARTHRITIS PAIN) 650 MG CR tablet 1 tablet as needed Orally as needed   ascorbic acid (VITAMIN C) 500 MG tablet 1 tablet Orally Once a day   aspirin EC 81 MG tablet Take 81 mg by mouth daily. Swallow whole.   b complex vitamins capsule Take 1 capsule by mouth daily.   CALCIUM-MAGNESUIUM-ZINC [id number]  MG TABS 1 tablet with a meal Orally Once a day   ezetimibe (ZETIA) 10 MG tablet Take by mouth.   gabapentin (NEURONTIN) 300 MG capsule Take 1 capsule (300 mg total) by mouth at bedtime.   Icosapent Ethyl (VASCEPA PO) 2 capsules by mouth twice a day with food   icosapent Ethyl (VASCEPA) 1 g capsule Take 2 g by mouth 2 (two) times daily.   meloxicam (MOBIC) 15 MG tablet Take 15 mg by mouth daily.   Multiple Vitamin (MULTIVITAMINS PO) 1 tablet Orally once a day   Omega-3 Fatty Acids (OMEGA III EPA+DHA) 1000 MG CAPS Take by mouth.   omeprazole (PRILOSEC OTC) 20 MG tablet 1 capsule Orally Once a day   Current Facility-Administered Medications (Other)  Medication Route   triamcinolone acetonide (KENALOG-40) injection 4 mg Intravitreal   REVIEW OF SYSTEMS: ROS   Positive for: Gastrointestinal, Neurological, Musculoskeletal, HENT, Eyes Negative for: Constitutional, Skin, Genitourinary, Endocrine, Cardiovascular, Respiratory, Psychiatric, Allergic/Imm, Heme/Lymph Last edited by Etheleen Mayhew, COT on 08/15/2023  2:39 PM.     ALLERGIES Allergies  Allergen Reactions   Atorvastatin Other (See Comments)    Muscle aches/ memory loss  Other Reaction(s): muscle aches/memory, Myalgias   Ezetimibe     Other Reaction(s): myalgias   Pravastatin     Other Reaction(s): myalgias  Simvastatin     Other Reaction(s): increased liver fxn   Penicillins Itching, Nausea Only and Nausea And Vomiting    Did it involve swelling of the face/tongue/throat, SOB, or low BP? Unknown  Did it involve sudden or severe rash/hives, skin peeling, or any reaction on the inside of your mouth or nose? Itching  Did you need to seek medical attention at a hospital or doctor's office? No  When did it last happen? 1970s  If all above answers are "NO", may proceed with cephalosporin use.  Tolerated Cephalosporin Date: 05/04/22.  Other reaction(s): Not available  Other Reaction(s): GI Intolerance   Sulfa  Antibiotics Nausea Only and Other (See Comments)   Sulfasalazine Other (See Comments)   PAST MEDICAL HISTORY Past Medical History:  Diagnosis Date   Anxiety    Arthritis    back. neck   Diabetes mellitus without complication (HCC)    Dysrhythmia    GERD (gastroesophageal reflux disease)    History of palpitations    HOH (hard of hearing) 06/28/2019   Hyperlipidemia    diet controlled and fish oil   MVP (mitral valve prolapse)    never has caused any problems per patient on 06/27/19   PONV (postoperative nausea and vomiting)    No problems in 08/2018; however has had previous problems   Pre-diabetes    diet controlled, no med   Retinal detachment    OU   SVD (spontaneous vaginal delivery)    x 2   Past Surgical History:  Procedure Laterality Date   ABDOMINAL HYSTERECTOMY  1970's   AIR/FLUID EXCHANGE Left 06/28/2019   Procedure: Air/Fluid Exchange;  Surgeon: Rennis Chris, MD;  Location: [facility] OR;  Service: Ophthalmology;  Laterality: Left;   CATARACT EXTRACTION Bilateral    Dr. Vonna Kotyk   COLONOSCOPY     polyp   EYE SURGERY Bilateral    Cat Sx and RD repair   GAS INSERTION Right 11/26/2014   Procedure: INSERTION OF GAS;  Surgeon: Sherrie George, MD;  Location: [facility] OR;  Service: Ophthalmology;  Laterality: Right;  C3F8   GAS/FLUID EXCHANGE Left 08/21/2018   Procedure: GAS/FLUID EXCHANGE;  Surgeon: Rennis Chris, MD;  Location: [facility] OR;  Service: Ophthalmology;  Laterality: Left;  C3F8   KNEE ARTHROSCOPY Left    LASER PHOTO ABLATION Left 06/28/2019   Procedure: Laser Photo Ablation;  Surgeon: Rennis Chris, MD;  Location: [facility] OR;  Service: Ophthalmology;  Laterality: Left;   PARS PLANA VITRECTOMY Left 06/28/2019   Procedure: PARS PLANA VITRECTOMY WITH 25 GAUGE;  Surgeon: Rennis Chris, MD;  Location: [facility] OR;  Service: Ophthalmology;  Laterality: Left;   PHOTOCOAGULATION WITH LASER Right 11/26/2014   Procedure: PHOTOCOAGULATION WITH LASER;  Surgeon: Sherrie George, MD;  Location: [facility] OR;   Service: Ophthalmology;  Laterality: Right;   PHOTOCOAGULATION WITH LASER Left 08/21/2018   Procedure: PHOTOCOAGULATION WITH LASER;  Surgeon: Rennis Chris, MD;  Location: [facility] OR;  Service: Ophthalmology;  Laterality: Left;   REPAIR OF COMPLEX TRACTION RETINAL DETACHMENT Left 12/06/2018   Procedure: REPAIR OF COMPLEX TRACTION RETINAL DETACHMENT, 25 gauge vitrectomy, endolaser photocoagulation, memebrane peel, perfluoron injection,  and silicone oil;  Surgeon: Rennis Chris, MD;  Location: [facility] OR;  Service: Ophthalmology;  Laterality: Left;   RETINAL DETACHMENT SURGERY Bilateral    SBP OD - Dr. Alan Mulder (1.12.16).  SBP - Dr. Rennis Chris (10.7.19)   SCLERAL BUCKLE Right 11/26/2014   Procedure: SCLERAL BUCKLE RIGHT EYE ;  Surgeon: Sherrie George, MD;  Location:  [facility] OR;  Service: Ophthalmology;  Laterality: Right;   SCLERAL BUCKLE WITH POSSIBLE 25 GAUGE PARS PLANA VITRECTOMY Left 08/21/2018   Procedure: SCLERAL BUCKLE WITH 25 GAUGE PARS PLANA VITRECTOMY;  Surgeon: Rennis Chris, MD;  Location: [facility] OR;  Service: Ophthalmology;  Laterality: Left;   SILICON OIL REMOVAL Left 06/28/2019   Procedure: Silicon Oil Removal;  Surgeon: Rennis Chris, MD;  Location: [facility] OR;  Service: Ophthalmology;  Laterality: Left;   TONSILLECTOMY     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   Procedure: TOTAL KNEE ARTHROPLASTY;  Surgeon: Ollen Gross, MD;  Location: [facility] ORS;  Service: Orthopedics;  Laterality: Left;   FAMILY HISTORY Family History  Problem Relation Age of Onset   Cancer Mother    Heart failure Father    SOCIAL HISTORY Social History   Tobacco Use   Smoking status: Former    Current packs/day: 0.00    Average packs/day: 0.5 packs/day for 10.0 years (5.0 ttl pk-yrs)    Types: Cigarettes    Start date: 63    Quit date: 10    Years since quitting: 54.7   Smokeless tobacco: Never   Tobacco comments:    quit in 1970's  Vaping Use   Vaping status: Never Used  Substance Use Topics   Alcohol use:  No   Drug use: No       OPHTHALMIC EXAM:  Base Eye Exam     Visual Acuity (Snellen - Linear)       Right Left   Dist cc 20/40 20/150   Dist ph cc NI 20/60 -2    Correction: Glasses         Tonometry (Tonopen, 2:47 PM)       Right Left   Pressure 8 10         Pupils       Pupils Dark Light Shape React APD   Right PERRL 3 2 Round Brisk None   Left PERRL 3 2 Round Brisk None         Visual Fields       Left Right    Full Full         Extraocular Movement       Right Left    Full, Ortho Full, Ortho         Neuro/Psych     Oriented x3: Yes   Mood/Affect: Normal         Dilation     Both eyes: 2.5% Phenylephrine @ 2:47 PM           Slit Lamp and Fundus Exam     External Exam       Right Left   External Normal Normal         Slit Lamp Exam       Right Left   Lids/Lashes Dermatochalasis - upper lid Dermatochalasis - upper lid   Conjunctiva/Sclera White and quiet Trace perilimbal Injection; no nodule   Cornea Temporal Well healed cataract wounds, mild arcus, tear film debris arcus, trace PEE, fine KP   Anterior Chamber deep and clear Deep, 4+ fine cell and pigment   Iris Round and dilated round and poorly dilated to 3.75   Lens PC IOL in good position with open PC PC IOL in good position with open PC   Anterior Vitreous Vitreous syneresis, mild pigment in anterior vitreous, Posterior vitreous detachment; scattered vitreous debris Post vitrectomy, fine +cell/pigment         Fundus Exam  Right Left   Disc Pink and Sharp, Compact Hazy view, Sharp rim, mild tilt, trace temporal pallor, temporal Peripapillary atrophy, Compact   C/D Ratio 0.2 0.3   Macula Flat, Good foveal reflex, Retinal pigment epithelial mottling, ERM, No heme or edema Hazy view, Flat, blunted foveal reflex, +epiretinal membrane, mild Retinal pigment epithelial mottling and clumping, cystic changes -- stably improved   Vessels attenuated, Tortuous Hazy view,  attenuated, Tortuous   Periphery Attached over scleral buckle, good buckle height, good scarring over buckle Retina attached over buckle; good buckle height; good laser surrounding buckle w/ new row posterior to buckle; +fibrosis at 0300 over buckle; retinotomy from 0300-0430 with good laser surrounding           Refraction     Wearing Rx       Sphere Cylinder Axis   Right -2.75 +1.75 157   Left -4.25 +1.75 117           IMAGING AND PROCEDURES  Imaging and Procedures for @TODAY @          ASSESSMENT/PLAN:    ICD-10-CM   1. 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 CME (cystoid macular edema), left  H35.352     3. 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 History of retinal detachment  Z86.69     6. Pseudophakia of both eyes  Z96.1     7. Dry eyes  H04.123      **pt here acutely due to decreased VA OS for the past week**  1. Iritis / pigment dispersion OS  - BCVA OS 20/60 - improved  - exam shows 4+ fine cell/pigment and fine KP  - continue PF to Q1-2H and Prolensa QID OS  - suggest taking Meloxicam once a day as well -- pt has it for her arthritis  - f/u 3 weeks  2. CME OS   - history of recurrent post-op CME - s/p sub-tenons kenalog (03.13.20) - interval increase / redevelopment of cystic changes OS on 12.09.21 and more recently on 12.20.22 -- restarted Prolensa QID OS (was still on PF QID OS) - pt follows with Dr. Vincente Poli, who she saw in April 2023, he tapered PF to BID OS and Prolensa to once a day.  - Prolensa increased back to BID per Dr. Vanessa Barbara (05.09.23),  - PF and Prolensa increased to 3-4/day (06.09.23), then back to BID on 07.11.23 - pt last saw Grewal on 04.18.24 -- he instructed her to use PF/Prolensa BID -- pt still using both TID - BCVA OS 20/60 - OCT shows Trace cystic changes nasal fovea; Persistent ERM, mild vitreous opacities - discussed possible need for low dose  maintenance therapy to keep CME and inflammation reduced - continue PF and prolensa for iritis as above  - f/u here in 3 wks -- DFE/OCT  3. Retinal detachment, OS             - originally: macula-sparing inferior retinal detachment from 3-6 oclock             - large HST at 0500 and 2 small tears at 300 within the detached retina             - s/p SBP + PPV/PFC/EL/FAX/14% C3F8 OS, 10.07.19 - progressive fibrosis/PVR just posterior to buckle around 0400 with +SRF tracking posterior to buckle -- focal tractional/PVR detachment OS             -  s/p PPV/MP/PFO/retinotomy/EL/1000cs silicon oil OS, 1.22.2020  - s/p 25g PPV with silicon oil removal (08.13.20) - retina attached and in good position -- good buckle height and laser over buckle and around retinotomy             - IOP okay at 13 today  - BCVA 20/150 as discussed above   4. Nodular scleritis OS -- recurrent, but currently quiescent - history of recurrent episodes following the tapering of meds (PO pred and topical PF/Prolensa)  - today (4.4.23) -- interval redevelopment of focal nodular scleritis in ST quad             - limited lab work to rule out rheumatologic cause--all results negative   CBC, CMP   HIV   HLA Panel   ANA   ANCA   ACE, Lysozyme   RF   ESR, CRP - today, inflammatory nodule stably improved  5. History of retinal detachment OD  - S/P SBP OD 11/26/14 -- Dr. Alan Mulder  - looks great -- retina in great position  - monitor  6. Pseudophakia OU  - s/p CE/IOL OU (Bevis)  - doing well  - monitor  7. Dry eyes OU - recommend lubricating ointment at night along with artificial tears     Ophthalmic Meds Ordered this visit:  No orders of the defined types were placed in this encounter.    No follow-ups on file.  There are no Patient Instructions on file for this visit.  This document serves as a record of services personally performed by Karie Chimera, MD, PhD. It was created on their behalf by  Charlette Caffey, COT an ophthalmic technician. The creation of this record is the provider's dictation and/or activities during the visit.    Electronically signed by:  Charlette Caffey, COT  08/15/23 3:52 PM  Karie Chimera, M.D., Ph.D. Diseases & Surgery of the Retina and Vitreous [facility]  Abbreviations: M myopia (nearsighted); A astigmatism; H hyperopia (farsighted); P presbyopia; Mrx spectacle prescription;  CTL contact lenses; OD right eye; OS left eye; OU both eyes  XT exotropia; ET esotropia; PEK punctate epithelial keratitis; PEE punctate epithelial erosions; DES dry eye syndrome; MGD meibomian gland dysfunction; ATs artificial tears; PFAT's preservative free artificial tears; NSC nuclear sclerotic cataract; PSC posterior subcapsular cataract; ERM epi-retinal membrane; PVD posterior vitreous detachment; RD retinal detachment; DM diabetes mellitus; DR diabetic retinopathy; NPDR non-proliferative diabetic retinopathy; PDR proliferative diabetic retinopathy; CSME clinically significant macular edema; DME diabetic macular edema; dbh dot blot hemorrhages; CWS cotton wool spot; POAG primary open angle glaucoma; C/D cup-to-disc ratio; HVF humphrey visual field; GVF goldmann visual field; OCT optical coherence tomography; IOP intraocular pressure; BRVO Branch retinal vein occlusion; CRVO central retinal vein occlusion; CRAO central retinal artery occlusion; BRAO branch retinal artery occlusion; RT retinal tear; SB scleral buckle; PPV pars plana vitrectomy; VH Vitreous hemorrhage; PRP panretinal laser photocoagulation; IVK intravitreal kenalog; VMT vitreomacular traction; MH Macular hole;  NVD neovascularization of the disc; NVE neovascularization elsewhere; AREDS age related eye disease study; ARMD age related macular degeneration; POAG primary open angle glaucoma; EBMD epithelial/anterior basement membrane dystrophy; ACIOL anterior chamber intraocular lens; IOL  intraocular lens; PCIOL posterior chamber intraocular lens; Phaco/IOL phacoemulsification with intraocular lens placement; PRK photorefractive keratectomy; LASIK laser assisted in situ keratomileusis; HTN hypertension; DM diabetes mellitus; COPD chronic obstructive pulmonary disease

## 2023-08-16 ENCOUNTER — Encounter (INDEPENDENT_AMBULATORY_CARE_PROVIDER_SITE_OTHER): Payer: Medicare Other | Admitting: Ophthalmology

## 2023-08-16 DIAGNOSIS — H15092 Other scleritis, left eye: Secondary | ICD-10-CM

## 2023-08-16 DIAGNOSIS — Z8669 Personal history of other diseases of the nervous system and sense organs: Secondary | ICD-10-CM

## 2023-08-16 DIAGNOSIS — H3322 Serous retinal detachment, left eye: Secondary | ICD-10-CM

## 2023-08-16 DIAGNOSIS — H209 Unspecified iridocyclitis: Secondary | ICD-10-CM

## 2023-08-16 DIAGNOSIS — H35352 Cystoid macular degeneration, left eye: Secondary | ICD-10-CM

## 2023-08-16 DIAGNOSIS — H04123 Dry eye syndrome of bilateral lacrimal glands: Secondary | ICD-10-CM

## 2023-08-16 DIAGNOSIS — Z961 Presence of intraocular lens: Secondary | ICD-10-CM

## 2023-08-18 ENCOUNTER — Encounter (INDEPENDENT_AMBULATORY_CARE_PROVIDER_SITE_OTHER): Payer: Self-pay | Admitting: Ophthalmology

## 2023-09-01 NOTE — Progress Notes (Signed)
[facility] - Clinic Note  09/07/2023     CHIEF COMPLAINT Patient presents for Retina Follow Up  HISTORY OF PRESENT ILLNESS: Carla May is a 82 y.o. female who presents to the clinic today for:   HPI     Retina Follow Up   In left eye.  This started 3 weeks ago.  Duration of 3 weeks.  Since onset it is stable.  I, the attending physician,  performed the HPI with the patient and updated documentation appropriately.        Comments   3 week retina follow up iritis OS pt is reporting vision is more clear she was on Gabapentin and states while she was on it the vision was more blurred she finished on Friday and her vision improved  she denies any flashes she does have some floaters she denies any pain or discomfort pt is using PF to Q1-2H and Prolensa QID OS and Meloxicam once a day Pr has appointment with Dr Vincente Poli at [facility] tomorrow       Last edited by Rennis Chris, MD on 09/07/2023 11:56 AM.    Pt states she stopped taking gabapentin bc she felt like it was blurring her vision, she is using PF Q2H and Prolensa QID, she is taking Meloxicam daily, she has an appt with Dr. Vincente Poli tomorrow, she denies eye pain   Referring physician: Vashti Hey, MD [address] [place],  [place] [address]  HISTORICAL INFORMATION:   Selected notes from the MEDICAL RECORD NUMBER Referred by Dr. Ashok Cordia for concern of retinal hole.   CURRENT MEDICATIONS: Current Outpatient Medications (Ophthalmic Drugs)  Medication Sig   Bromfenac Sodium 0.07 % SOLN Place 1 drop into the left eye 4 (four) times daily.   Carboxymethylcellulose Sod PF (REFRESH PLUS) 0.5 % SOLN as directed Ophthalmic   prednisoLONE acetate (PRED FORTE) 1 % ophthalmic suspension place 1 drop into L eye Ophthalmic Twice a day for 30   prednisoLONE acetate (PRED FORTE) 1 % ophthalmic suspension Place 1 drop into the left eye every hour while awake.   No current facility-administered medications for  this visit. (Ophthalmic Drugs)   Current Outpatient Medications (Other)  Medication Sig   acetaminophen (TYLENOL 8 HOUR ARTHRITIS PAIN) 650 MG CR tablet 1 tablet as needed Orally as needed   ascorbic acid (VITAMIN C) 500 MG tablet 1 tablet Orally Once a day   aspirin EC 81 MG tablet Take 81 mg by mouth daily. Swallow whole.   b complex vitamins capsule Take 1 capsule by mouth daily.   CALCIUM-MAGNESUIUM-ZINC 333-133-8.3 MG TABS 1 tablet with a meal Orally Once a day   ezetimibe (ZETIA) 10 MG tablet Take by mouth.   gabapentin (NEURONTIN) 300 MG capsule Take 1 capsule (300 mg total) by mouth at bedtime.   Icosapent Ethyl (VASCEPA PO) 2 capsules by mouth twice a day with food   icosapent Ethyl (VASCEPA) 1 g capsule Take 2 g by mouth 2 (two) times daily.   meloxicam (MOBIC) 15 MG tablet Take 15 mg by mouth daily.   Multiple Vitamin (MULTIVITAMINS PO) 1 tablet Orally once a day   Omega-3 Fatty Acids (OMEGA III EPA+DHA) 1000 MG CAPS Take by mouth.   omeprazole (PRILOSEC OTC) 20 MG tablet 1 capsule Orally Once a day   Current Facility-Administered Medications (Other)  Medication Route   triamcinolone acetonide (KENALOG-40) injection 4 mg Intravitreal   REVIEW OF SYSTEMS: ROS   Positive for: Gastrointestinal, Neurological,  Musculoskeletal, HENT, Eyes Negative for: Constitutional, Skin, Genitourinary, Endocrine, Cardiovascular, Respiratory, Psychiatric, Allergic/Imm, Heme/Lymph Last edited by Etheleen Mayhew, COT on 09/07/2023  9:34 AM.      ALLERGIES Allergies  Allergen Reactions   Atorvastatin Other (See Comments)    Muscle aches/ memory loss  Other Reaction(s): muscle aches/memory, Myalgias   Ezetimibe     Other Reaction(s): myalgias   Pravastatin     Other Reaction(s): myalgias   Simvastatin     Other Reaction(s): increased liver fxn   Penicillins Itching, Nausea Only and Nausea And Vomiting    Did it involve swelling of the face/tongue/throat, SOB, or low BP?  Unknown  Did it involve sudden or severe rash/hives, skin peeling, or any reaction on the inside of your mouth or nose? Itching  Did you need to seek medical attention at a hospital or doctor's office? No  When did it last happen? 1970s  If all above answers are "NO", may proceed with cephalosporin use.  Tolerated Cephalosporin Date: 05/04/22.  Other reaction(s): Not available  Other Reaction(s): GI Intolerance   Sulfa Antibiotics Nausea Only and Other (See Comments)   Sulfasalazine Other (See Comments)   PAST MEDICAL HISTORY Past Medical History:  Diagnosis Date   Anxiety    Arthritis    back. neck   Diabetes mellitus without complication (HCC)    Dysrhythmia    GERD (gastroesophageal reflux disease)    History of palpitations    HOH (hard of hearing) 06/28/2019   Hyperlipidemia    diet controlled and fish oil   MVP (mitral valve prolapse)    never has caused any problems per patient on 06/27/19   PONV (postoperative nausea and vomiting)    No problems in 08/2018; however has had previous problems   Pre-diabetes    diet controlled, no med   Retinal detachment    OU   SVD (spontaneous vaginal delivery)    x 2   Past Surgical History:  Procedure Laterality Date   ABDOMINAL HYSTERECTOMY  1970's   AIR/FLUID EXCHANGE Left 06/28/2019   Procedure: Air/Fluid Exchange;  Surgeon: Rennis Chris, MD;  Location: [facility] OR;  Service: Ophthalmology;  Laterality: Left;   CATARACT EXTRACTION Bilateral    Dr. Vonna Kotyk   COLONOSCOPY     polyp   EYE SURGERY Bilateral    Cat Sx and RD repair   GAS INSERTION Right 11/26/2014   Procedure: INSERTION OF GAS;  Surgeon: Sherrie George, MD;  Location: [facility] OR;  Service: Ophthalmology;  Laterality: Right;  C3F8   GAS/FLUID EXCHANGE Left 08/21/2018   Procedure: GAS/FLUID EXCHANGE;  Surgeon: Rennis Chris, MD;  Location: [facility] OR;  Service: Ophthalmology;  Laterality: Left;  C3F8   KNEE ARTHROSCOPY Left    LASER PHOTO ABLATION Left 06/28/2019    Procedure: Laser Photo Ablation;  Surgeon: Rennis Chris, MD;  Location: [facility] OR;  Service: Ophthalmology;  Laterality: Left;   PARS PLANA VITRECTOMY Left 06/28/2019   Procedure: PARS PLANA VITRECTOMY WITH 25 GAUGE;  Surgeon: Rennis Chris, MD;  Location: [facility] OR;  Service: Ophthalmology;  Laterality: Left;   PHOTOCOAGULATION WITH LASER Right 11/26/2014   Procedure: PHOTOCOAGULATION WITH LASER;  Surgeon: Sherrie George, MD;  Location: [facility] OR;  Service: Ophthalmology;  Laterality: Right;   PHOTOCOAGULATION WITH LASER Left 08/21/2018   Procedure: PHOTOCOAGULATION WITH LASER;  Surgeon: Rennis Chris, MD;  Location: [facility] OR;  Service: Ophthalmology;  Laterality: Left;   REPAIR OF COMPLEX TRACTION RETINAL DETACHMENT Left 12/06/2018   Procedure: REPAIR OF  COMPLEX TRACTION RETINAL DETACHMENT, 25 gauge vitrectomy, endolaser photocoagulation, memebrane peel, perfluoron injection,  and silicone oil;  Surgeon: Rennis Chris, MD;  Location: [facility] OR;  Service: Ophthalmology;  Laterality: Left;   RETINAL DETACHMENT SURGERY Bilateral    SBP OD - Dr. Alan Mulder (1.12.16).  SBP - Dr. Rennis Chris (10.7.19)   SCLERAL BUCKLE Right 11/26/2014   Procedure: SCLERAL BUCKLE RIGHT EYE ;  Surgeon: Sherrie George, MD;  Location: [facility] OR;  Service: Ophthalmology;  Laterality: Right;   SCLERAL BUCKLE WITH POSSIBLE 25 GAUGE PARS PLANA VITRECTOMY Left 08/21/2018   Procedure: SCLERAL BUCKLE WITH 25 GAUGE PARS PLANA VITRECTOMY;  Surgeon: Rennis Chris, MD;  Location: [facility] OR;  Service: Ophthalmology;  Laterality: Left;   SILICON OIL REMOVAL Left 06/28/2019   Procedure: Silicon Oil Removal;  Surgeon: Rennis Chris, MD;  Location: [facility] OR;  Service: Ophthalmology;  Laterality: Left;   TONSILLECTOMY     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   Procedure: TOTAL KNEE ARTHROPLASTY;  Surgeon: Ollen Gross, MD;  Location: [facility] ORS;  Service: Orthopedics;  Laterality: Left;   FAMILY HISTORY Family History  Problem Relation Age of Onset   Cancer  Mother    Heart failure Father    SOCIAL HISTORY Social History   Tobacco Use   Smoking status: Former    Current packs/day: 0.00    Average packs/day: 0.5 packs/day for 10.0 years (5.0 ttl pk-yrs)    Types: Cigarettes    Start date: 59    Quit date: 12    Years since quitting: 54.8   Smokeless tobacco: Never   Tobacco comments:    quit in 1970's  Vaping Use   Vaping status: Never Used  Substance Use Topics   Alcohol use: No   Drug use: No       OPHTHALMIC EXAM:  Base Eye Exam     Visual Acuity (Snellen - Linear)       Right Left   Dist cc 20/40 -1 HM   Dist ph cc NI 20/200         Tonometry (Tonopen, 10:18 AM)       Right Left   Pressure 19 16         Pupils       Pupils Dark Light Shape React APD   Right PERRL 3 2 Round Brisk None   Left PERRL 3 2 Round Brisk None         Visual Fields       Left Right    Full Full         Extraocular Movement       Right Left    Full, Ortho Full, Ortho         Neuro/Psych     Oriented x3: Yes   Mood/Affect: Normal           Slit Lamp and Fundus Exam     External Exam       Right Left   External Normal Normal         Slit Lamp Exam       Right Left   Lids/Lashes Dermatochalasis - upper lid Dermatochalasis - upper lid, Ptosis   Conjunctiva/Sclera White and quiet White and quiet, mild elevation ST quad   Cornea Temporal Well healed cataract wounds, mild arcus, tear film debris tear film debris, irregular epithelium   Anterior Chamber deep and clear Deep, 3-4+ fine cell and pigment   Iris Round and dilated round and poorly dilated  Lens PC IOL in good position with open PC PC IOL in good position with open PC   Anterior Vitreous Vitreous syneresis, mild pigment in anterior vitreous, Posterior vitreous detachment; scattered vitreous debris Hazy view, Post vitrectomy, fine +cell/pigment         Fundus Exam       Right Left   Disc Pink and Sharp, Compact Hazy view, Sharp rim,  mild tilt, trace temporal pallor, temporal Peripapillary atrophy, Compact   C/D Ratio 0.2 0.3   Macula Flat, Good foveal reflex, Retinal pigment epithelial mottling, ERM, No heme or edema Hazy view, Flat, blunted foveal reflex, +epiretinal membrane, mild Retinal pigment epithelial mottling and clumping, cystic changes -- stably improved   Vessels attenuated, Tortuous Hazy view, attenuated, Tortuous   Periphery Attached over scleral buckle, good buckle height, good scarring over buckle Retina attached over buckle; good buckle height; good laser surrounding buckle w/ new row posterior to buckle; +fibrosis at 0300 over buckle; retinotomy from 0300-0430 with good laser surrounding           Refraction     Wearing Rx       Sphere Cylinder Axis   Right -2.75 +1.75 157   Left -4.25 +1.75 117           IMAGING AND PROCEDURES  Imaging and Procedures for @TODAY @  OCT, Retina - OU - Both Eyes       Right Eye Quality was good. Central Foveal Thickness: 293. Progression has been stable. Findings include normal foveal contour, no IRF, no SRF, epiretinal membrane (Mild ERM -- stable).   Left Eye Quality was poor. Central Foveal Thickness: 331. Progression has worsened. Findings include normal foveal contour, no IRF, no SRF, epiretinal membrane (Hazy view; Persistent ERM with blunting of foveal contour, mild vitreous opacities).   Notes *Images captured and stored on drive  Diagnosis / Impression:  OD: mild ERM; NFP; no IRF/SRF-stable from prior OS: Hazy view; Persistent ERM with blunting of foveal contour, mild vitreous opacities  Clinical management:  See below  Abbreviations: NFP - Normal foveal profile. CME - cystoid macular edema. PED - pigment epithelial detachment. IRF - intraretinal fluid. SRF - subretinal fluid. EZ - ellipsoid zone. ERM - epiretinal membrane. ORA - outer retinal atrophy. ORT - outer retinal tubulation. SRHM - subretinal hyper-reflective material             ASSESSMENT/PLAN:    ICD-10-CM   1. 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 CME (cystoid macular edema), left  H35.352     3. 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 History of retinal detachment  Z86.69     6. Pseudophakia of both eyes  Z96.1     7. Dry eyes  H04.123      1. Iritis / pigment dispersion OS - was improving on PF q1h and Prolensa QID OS, but slightly worse today after decreasing PF to q2h  - BCVA OS 20/200 - decreased from 20/60  - exam shows 3-4+ fine cell/pigment from 2+; no KP  - recommend increasing PF back to [facility] and Prolensa QID OS  - cont Meloxicam once a day as well -- pt has it for her arthritis  - would recommend oral prednisone as next step, but will defer to Dr. Vincente Poli, who pt will see tomorrow  - f/u 3 weeks  2.  CME OS   - history of recurrent post-op CME - s/p sub-tenons kenalog (03.13.20) - interval increase / redevelopment of cystic changes OS on 12.09.21 and more recently on 12.20.22 -- restarted Prolensa QID OS (was still on PF QID OS) - pt follows with Dr. Vincente Poli, who she saw in April 2023, he tapered PF to BID OS and Prolensa to once a day.  - Prolensa increased back to BID per Dr. Vanessa Barbara (05.09.23),  - PF and Prolensa increased to 3-4/day (06.09.23), then back to BID on 07.11.23 - pt last saw Grewal on 04.18.24 -- he instructed her to use PF/Prolensa BID -- pt still using both TID - BCVA OS 20/200 - OCT shows Hazy view; Persistent ERM with blunting of foveal contour, mild vitreous opacities  - discussed possible need for low dose maintenance therapy to keep CME and inflammation reduced - continue PF and prolensa for iritis as above  - f/u here in 3 wks -- DFE/OCT  3. Retinal detachment, OS             - originally: macula-sparing inferior retinal detachment from 3-6 oclock             - large HST at 0500 and 2 small tears at 300 within the detached retina              - s/p SBP + PPV/PFC/EL/FAX/14% C3F8 OS, 10.07.19 - progressive fibrosis/PVR just posterior to buckle around 0400 with +SRF tracking posterior to buckle -- focal tractional/PVR detachment OS             - s/p PPV/MP/PFO/retinotomy/EL/1000cs silicon oil OS, 1.22.2020  - s/p 25g PPV with silicon oil removal (08.13.20) - retina attached and in good position -- good buckle height and laser over buckle and around retinotomy             - IOP okay at 13 today  - BCVA 20/200 as discussed above   4. Nodular scleritis OS -- recurrent, but currently quiescent - history of recurrent episodes following the tapering of meds (PO pred and topical PF/Prolensa)  - today (4.4.23) -- interval redevelopment of focal nodular scleritis in ST quad             - limited lab work to rule out rheumatologic cause--all results negative   CBC, CMP   HIV   HLA Panel   ANA   ANCA   ACE, Lysozyme   RF   ESR, CRP - today, inflammatory nodule stably improved  5. History of retinal detachment OD  - S/P SBP OD 11/26/14 -- Dr. Alan Mulder  - looks great -- retina in great position  - monitor  6. Pseudophakia OU  - s/p CE/IOL OU (Bevis)  - doing well  - monitor  7. Dry eyes OU - recommend lubricating ointment at night along with artificial tears     Ophthalmic Meds Ordered this visit:  No orders of the defined types were placed in this encounter.    Return for f/u 2-3 weeks, iritis OS, DFE, OCT.  There are no Patient Instructions on file for this visit.  This document serves as a record of services personally performed by Karie Chimera, MD, PhD. It was created on their behalf by De Blanch, an ophthalmic technician. The creation of this record is the provider's dictation and/or activities during the visit.    Electronically signed by: De Blanch, OA, 09/07/23  11:57 AM  This document serves as a record of services personally  performed by Karie Chimera, MD, PhD. It was created on their  behalf by Glee Arvin. Manson Passey, OA an ophthalmic technician. The creation of this record is the provider's dictation and/or activities during the visit.    Electronically signed by: Glee Arvin. Manson Passey, OA 09/07/23 11:57 AM  Karie Chimera, M.D., Ph.D. Diseases & Surgery of the Retina and Vitreous [facility]  I have reviewed the above documentation for accuracy and completeness, and I agree with the above. Karie Chimera, M.D., Ph.D. 09/07/23 11:59 AM  Abbreviations: M myopia (nearsighted); A astigmatism; H hyperopia (farsighted); P presbyopia; Mrx spectacle prescription;  CTL contact lenses; OD right eye; OS left eye; OU both eyes  XT exotropia; ET esotropia; PEK punctate epithelial keratitis; PEE punctate epithelial erosions; DES dry eye syndrome; MGD meibomian gland dysfunction; ATs artificial tears; PFAT's preservative free artificial tears; NSC nuclear sclerotic cataract; PSC posterior subcapsular cataract; ERM epi-retinal membrane; PVD posterior vitreous detachment; RD retinal detachment; DM diabetes mellitus; DR diabetic retinopathy; NPDR non-proliferative diabetic retinopathy; PDR proliferative diabetic retinopathy; CSME clinically significant macular edema; DME diabetic macular edema; dbh dot blot hemorrhages; CWS cotton wool spot; POAG primary open angle glaucoma; C/D cup-to-disc ratio; HVF humphrey visual field; GVF goldmann visual field; OCT optical coherence tomography; IOP intraocular pressure; BRVO Branch retinal vein occlusion; CRVO central retinal vein occlusion; CRAO central retinal artery occlusion; BRAO branch retinal artery occlusion; RT retinal tear; SB scleral buckle; PPV pars plana vitrectomy; VH Vitreous hemorrhage; PRP panretinal laser photocoagulation; IVK intravitreal kenalog; VMT vitreomacular traction; MH Macular hole;  NVD neovascularization of the disc; NVE neovascularization elsewhere; AREDS age related eye disease study; ARMD age related macular  degeneration; POAG primary open angle glaucoma; EBMD epithelial/anterior basement membrane dystrophy; ACIOL anterior chamber intraocular lens; IOL intraocular lens; PCIOL posterior chamber intraocular lens; Phaco/IOL phacoemulsification with intraocular lens placement; PRK photorefractive keratectomy; LASIK laser assisted in situ keratomileusis; HTN hypertension; DM diabetes mellitus; COPD chronic obstructive pulmonary disease

## 2023-09-07 ENCOUNTER — Ambulatory Visit (INDEPENDENT_AMBULATORY_CARE_PROVIDER_SITE_OTHER): Payer: Medicare Other | Admitting: Ophthalmology

## 2023-09-07 ENCOUNTER — Encounter (INDEPENDENT_AMBULATORY_CARE_PROVIDER_SITE_OTHER): Payer: Self-pay | Admitting: Ophthalmology

## 2023-09-07 DIAGNOSIS — H3322 Serous retinal detachment, left eye: Secondary | ICD-10-CM

## 2023-09-07 DIAGNOSIS — H35352 Cystoid macular degeneration, left eye: Secondary | ICD-10-CM

## 2023-09-07 DIAGNOSIS — Z8669 Personal history of other diseases of the nervous system and sense organs: Secondary | ICD-10-CM

## 2023-09-07 DIAGNOSIS — Z961 Presence of intraocular lens: Secondary | ICD-10-CM

## 2023-09-07 DIAGNOSIS — H04123 Dry eye syndrome of bilateral lacrimal glands: Secondary | ICD-10-CM

## 2023-09-07 DIAGNOSIS — H209 Unspecified iridocyclitis: Secondary | ICD-10-CM | POA: Diagnosis not present

## 2023-09-07 DIAGNOSIS — H15092 Other scleritis, left eye: Secondary | ICD-10-CM | POA: Diagnosis not present

## 2023-09-07 NOTE — Telephone Encounter (Signed)
Rx filled via phone request [facility]

## 2023-09-13 NOTE — Progress Notes (Signed)
[facility] - Clinic Note  09/27/2023     CHIEF COMPLAINT Patient presents for Retina Follow Up  HISTORY OF PRESENT ILLNESS: Carla May is a 82 y.o. female who presents to the clinic today for:   HPI     Retina Follow Up   Patient presents with  Other.  In left eye.  This started 3 weeks ago.        Comments   Patient here for 3 weeks retina follow up for iritis OS. Patient states vision it might be a little bit better. Not where should be. Using drops Difluprednate started this week 6 times a day. Sees DR Vincente Poli this week 14th.      Last edited by Laddie Aquas, COA on 09/27/2023  9:42 AM.       Referring physician: Ollen Bowl, MD [address] [place],  [place] [address]  HISTORICAL INFORMATION:   Selected notes from the MEDICAL RECORD NUMBER Referred by Dr. Ashok Cordia for concern of retinal hole.   CURRENT MEDICATIONS: Current Outpatient Medications (Ophthalmic Drugs)  Medication Sig   Carboxymethylcellulose Sod PF (REFRESH PLUS) 0.5 % SOLN as directed Ophthalmic   Bromfenac Sodium 0.07 % SOLN Place 1 drop into the left eye 4 (four) times daily. (Patient not taking: Reported on 09/27/2023)   prednisoLONE acetate (PRED FORTE) 1 % ophthalmic suspension place 1 drop into L eye Ophthalmic Twice a day for 30 (Patient not taking: Reported on 09/27/2023)   prednisoLONE acetate (PRED FORTE) 1 % ophthalmic suspension Place 1 drop into the left eye every hour while awake. (Patient not taking: Reported on 09/27/2023)   No current facility-administered medications for this visit. (Ophthalmic Drugs)   Current Outpatient Medications (Other)  Medication Sig   acetaminophen (TYLENOL 8 HOUR ARTHRITIS PAIN) 650 MG CR tablet 1 tablet as needed Orally as needed   ascorbic acid (VITAMIN C) 500 MG tablet 1 tablet Orally Once a day   aspirin EC 81 MG tablet Take 81 mg by mouth daily. Swallow whole.   b complex vitamins capsule Take  1 capsule by mouth daily.   gabapentin (NEURONTIN) 300 MG capsule Take 1 capsule (300 mg total) by mouth at bedtime.   Icosapent Ethyl (VASCEPA PO) 2 capsules by mouth twice a day with food   icosapent Ethyl (VASCEPA) 1 g capsule Take 2 g by mouth 2 (two) times daily.   Multiple Vitamin (MULTIVITAMINS PO) 1 tablet Orally once a day   Omega-3 Fatty Acids (OMEGA III EPA+DHA) 1000 MG CAPS Take by mouth.   omeprazole (PRILOSEC OTC) 20 MG tablet 1 capsule Orally Once a day   CALCIUM-MAGNESUIUM-ZINC 333-133-8.3 MG TABS 1 tablet with a meal Orally Once a day   ezetimibe (ZETIA) 10 MG tablet Take by mouth.   meloxicam (MOBIC) 15 MG tablet Take 15 mg by mouth daily.   Current Facility-Administered Medications (Other)  Medication Route   triamcinolone acetonide (KENALOG-40) injection 4 mg Intravitreal   REVIEW OF SYSTEMS: ROS   Positive for: Gastrointestinal, Neurological, Musculoskeletal, HENT, Eyes Negative for: Constitutional, Skin, Genitourinary, Endocrine, Cardiovascular, Respiratory, Psychiatric, Allergic/Imm, Heme/Lymph Last edited by Laddie Aquas, COA on 09/27/2023  9:42 AM.       ALLERGIES Allergies  Allergen Reactions   Atorvastatin Other (See Comments)    Muscle aches/ memory loss  Other Reaction(s): muscle aches/memory, Myalgias   Ezetimibe     Other Reaction(s): myalgias   Pravastatin     Other Reaction(s):  myalgias   Simvastatin     Other Reaction(s): increased liver fxn   Penicillins Itching, Nausea Only and Nausea And Vomiting    Did it involve swelling of the face/tongue/throat, SOB, or low BP? Unknown  Did it involve sudden or severe rash/hives, skin peeling, or any reaction on the inside of your mouth or nose? Itching  Did you need to seek medical attention at a hospital or doctor's office? No  When did it last happen? 1970s  If all above answers are "NO", may proceed with cephalosporin use.  Tolerated Cephalosporin Date: 05/04/22.  Other reaction(s):  Not available  Other Reaction(s): GI Intolerance   Sulfa Antibiotics Nausea Only and Other (See Comments)   Sulfasalazine Other (See Comments)   PAST MEDICAL HISTORY Past Medical History:  Diagnosis Date   Anxiety    Arthritis    back. neck   Diabetes mellitus without complication (HCC)    Dysrhythmia    GERD (gastroesophageal reflux disease)    History of palpitations    HOH (hard of hearing) 06/28/2019   Hyperlipidemia    diet controlled and fish oil   MVP (mitral valve prolapse)    never has caused any problems per patient on 06/27/19   PONV (postoperative nausea and vomiting)    No problems in 08/2018; however has had previous problems   Pre-diabetes    diet controlled, no med   Retinal detachment    OU   SVD (spontaneous vaginal delivery)    x 2   Past Surgical History:  Procedure Laterality Date   ABDOMINAL HYSTERECTOMY  1970's   AIR/FLUID EXCHANGE Left 06/28/2019   Procedure: Air/Fluid Exchange;  Surgeon: Rennis Chris, MD;  Location: [facility] OR;  Service: Ophthalmology;  Laterality: Left;   CATARACT EXTRACTION Bilateral    Dr. Vonna Kotyk   COLONOSCOPY     polyp   EYE SURGERY Bilateral    Cat Sx and RD repair   GAS INSERTION Right 11/26/2014   Procedure: INSERTION OF GAS;  Surgeon: Sherrie George, MD;  Location: [facility] OR;  Service: Ophthalmology;  Laterality: Right;  C3F8   GAS/FLUID EXCHANGE Left 08/21/2018   Procedure: GAS/FLUID EXCHANGE;  Surgeon: Rennis Chris, MD;  Location: [facility] OR;  Service: Ophthalmology;  Laterality: Left;  C3F8   KNEE ARTHROSCOPY Left    LASER PHOTO ABLATION Left 06/28/2019   Procedure: Laser Photo Ablation;  Surgeon: Rennis Chris, MD;  Location: [facility] OR;  Service: Ophthalmology;  Laterality: Left;   PARS PLANA VITRECTOMY Left 06/28/2019   Procedure: PARS PLANA VITRECTOMY WITH 25 GAUGE;  Surgeon: Rennis Chris, MD;  Location: [facility] OR;  Service: Ophthalmology;  Laterality: Left;   PHOTOCOAGULATION WITH LASER Right 11/26/2014   Procedure: PHOTOCOAGULATION  WITH LASER;  Surgeon: Sherrie George, MD;  Location: [facility] OR;  Service: Ophthalmology;  Laterality: Right;   PHOTOCOAGULATION WITH LASER Left 08/21/2018   Procedure: PHOTOCOAGULATION WITH LASER;  Surgeon: Rennis Chris, MD;  Location: [facility] OR;  Service: Ophthalmology;  Laterality: Left;   REPAIR OF COMPLEX TRACTION RETINAL DETACHMENT Left 12/06/2018   Procedure: REPAIR OF COMPLEX TRACTION RETINAL DETACHMENT, 25 gauge vitrectomy, endolaser photocoagulation, memebrane peel, perfluoron injection,  and silicone oil;  Surgeon: Rennis Chris, MD;  Location: [facility] OR;  Service: Ophthalmology;  Laterality: Left;   RETINAL DETACHMENT SURGERY Bilateral    SBP OD - Dr. Alan Mulder (1.12.16).  SBP - Dr. Rennis Chris (10.7.19)   SCLERAL BUCKLE Right 11/26/2014   Procedure: SCLERAL BUCKLE RIGHT EYE ;  Surgeon: Sherrie George,  MD;  Location: [facility] OR;  Service: Ophthalmology;  Laterality: Right;   SCLERAL BUCKLE WITH POSSIBLE 25 GAUGE PARS PLANA VITRECTOMY Left 08/21/2018   Procedure: SCLERAL BUCKLE WITH 25 GAUGE PARS PLANA VITRECTOMY;  Surgeon: Rennis Chris, MD;  Location: [facility] OR;  Service: Ophthalmology;  Laterality: Left;   SILICON OIL REMOVAL Left 06/28/2019   Procedure: Silicon Oil Removal;  Surgeon: Rennis Chris, MD;  Location: [facility] OR;  Service: Ophthalmology;  Laterality: Left;   TONSILLECTOMY     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   Procedure: TOTAL KNEE ARTHROPLASTY;  Surgeon: Ollen Gross, MD;  Location: [facility] ORS;  Service: Orthopedics;  Laterality: Left;   FAMILY HISTORY Family History  Problem Relation Age of Onset   Cancer Mother    Heart failure Father    SOCIAL HISTORY Social History   Tobacco Use   Smoking status: Former    Current packs/day: 0.00    Average packs/day: 0.5 packs/day for 10.0 years (5.0 ttl pk-yrs)    Types: Cigarettes    Start date: 41    Quit date: 60    Years since quitting: 54.9   Smokeless tobacco: Never   Tobacco comments:    quit in 1970's  Vaping Use    Vaping status: Never Used  Substance Use Topics   Alcohol use: No   Drug use: No       OPHTHALMIC EXAM:  Base Eye Exam     Visual Acuity (Snellen - Linear)       Right Left   Dist cc 20/40 -2 20/800   Dist ph cc 20/40 +2 20/200    Correction: Glasses         Tonometry (Tonopen, 9:38 AM)       Right Left   Pressure 12 05         Pupils       Dark Light Shape React APD   Right 3 2 Round Brisk None   Left 3 2 Round Brisk None         Visual Fields (Counting fingers)       Left Right    Full Full         Extraocular Movement       Right Left    Full, Ortho Full, Ortho         Neuro/Psych     Oriented x3: Yes   Mood/Affect: Normal         Dilation     Both eyes: 1.0% Mydriacyl, 2.5% Phenylephrine @ 9:38 AM           Slit Lamp and Fundus Exam     External Exam       Right Left   External Normal Normal         Slit Lamp Exam       Right Left   Lids/Lashes Dermatochalasis - upper lid Dermatochalasis - upper lid, Ptosis   Conjunctiva/Sclera White and quiet White and quiet, mild elevation ST quad   Cornea Temporal Well healed cataract wounds, mild arcus, tear film debris tear film debris, irregular epithelium   Anterior Chamber deep and clear Deep, 3-4+ fine cell and pigment   Iris Round and dilated round and poorly dilated   Lens PC IOL in good position with open PC PC IOL in good position with open PC   Anterior Vitreous Vitreous syneresis, mild pigment in anterior vitreous, Posterior vitreous detachment; scattered vitreous debris Hazy view, Post vitrectomy, fine +cell/pigment  Fundus Exam       Right Left   Disc Pink and Sharp, Compact Hazy view, Sharp rim, mild tilt, trace temporal pallor, temporal Peripapillary atrophy, Compact   C/D Ratio 0.2 0.3   Macula Flat, Good foveal reflex, Retinal pigment epithelial mottling, ERM, No heme or edema Hazy view, Flat, blunted foveal reflex, +epiretinal membrane, mild Retinal  pigment epithelial mottling and clumping, cystic changes -- stably improved   Vessels attenuated, Tortuous Hazy view, attenuated, Tortuous   Periphery Attached over scleral buckle, good buckle height, good scarring over buckle Retina attached over buckle; good buckle height; good laser surrounding buckle w/ new row posterior to buckle; +fibrosis at 0300 over buckle; retinotomy from 0300-0430 with good laser surrounding           Refraction     Wearing Rx       Sphere Cylinder Axis   Right -2.75 +1.75 157   Left -4.25 +1.75 117           IMAGING AND PROCEDURES  Imaging and Procedures for @TODAY @  OCT, Retina - OU - Both Eyes       Right Eye Quality was good. Central Foveal Thickness: 295. Progression has been stable. Findings include normal foveal contour, no IRF, no SRF, epiretinal membrane (Mild ERM -- stable).   Left Eye Quality was poor. Central Foveal Thickness: 399. Progression has worsened. Findings include normal foveal contour, no IRF, no SRF, epiretinal membrane (Hazy view; Persistent ERM with blunting of foveal contour, mild vitreous opacities).   Notes *Images captured and stored on drive  Diagnosis / Impression:  OD: mild ERM; NFP; no IRF/SRF-stable from prior OS: Hazy view; Persistent ERM with blunting of foveal contour, mild vitreous opacities  Clinical management:  See below  Abbreviations: NFP - Normal foveal profile. CME - cystoid macular edema. PED - pigment epithelial detachment. IRF - intraretinal fluid. SRF - subretinal fluid. EZ - ellipsoid zone. ERM - epiretinal membrane. ORA - outer retinal atrophy. ORT - outer retinal tubulation. SRHM - subretinal hyper-reflective material             ASSESSMENT/PLAN:    ICD-10-CM   1. 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 CME (cystoid macular edema), left  H35.352     3. 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 History of  retinal detachment  Z86.69     6. Pseudophakia of both eyes  Z96.1     7. Dry eyes  H04.123       1. Iritis / pigment dispersion OS - was improving on PF q1h and Prolensa QID OS, but slightly worse today after decreasing PF to q2h  - BCVA OS 20/200 - decreased from 20/60  - exam shows 2-3+ fine cell/pigment from 3-4+; no KP  - recommend increasing PF back to [facility] and Prolensa QID OS  - cont Meloxicam once a day as well -- pt has it for her arthritis - would recommend oral prednisone as next step -  Dr. Evelena Leyden Durezol OS 6 times a day- she will see him Thursday  - f/u as scheduled  2. CME OS   - history of recurrent post-op CME - s/p sub-tenons kenalog (03.13.20) - interval increase / redevelopment of cystic changes OS on 12.09.21 and more recently on 12.20.22 -- restarted Prolensa QID OS (was still on PF QID OS) -  pt follows with Dr. Vincente Poli, who she saw in April 2023, he tapered PF to BID OS and Prolensa to once a day.  - Prolensa increased back to BID per Dr. Vanessa Barbara (05.09.23),  - PF and Prolensa increased to 3-4/day (06.09.23), then back to BID on 07.11.23 - pt last saw Grewal on 04.18.24 -- he instructed her to use PF/Prolensa BID -- pt still using both TID - BCVA OS 20/200 - stable - OCT shows; Persistent ERM, mild vitreous opacities  - discussed possible need for low dose maintenance therapy to keep CME and inflammation reduced - continue PF and prolensa for iritis as above  - f/u here in 3 wks -- DFE/OCT  3. Retinal detachment, OS             - originally: macula-sparing inferior retinal detachment from 3-6 oclock             - large HST at 0500 and 2 small tears at 300 within the detached retina             - s/p SBP + PPV/PFC/EL/FAX/14% C3F8 OS, 10.07.19 - progressive fibrosis/PVR just posterior to buckle around 0400 with +SRF tracking posterior to buckle -- focal tractional/PVR detachment OS             - s/p PPV/MP/PFO/retinotomy/EL/1000cs silicon oil OS,  1.22.2020  - s/p 25g PPV with silicon oil removal (08.13.20) - retina attached and in good position -- good buckle height and laser over buckle and around retinotomy             - IOP okay at 13 today  - BCVA 20/200 as discussed above   4. Nodular scleritis OS -- recurrent, but currently quiescent - history of recurrent episodes following the tapering of meds (PO pred and topical PF/Prolensa)  - today (4.4.23) -- interval redevelopment of focal nodular scleritis in ST quad             - limited lab work to rule out rheumatologic cause--all results negative   CBC, CMP   HIV   HLA Panel   ANA   ANCA   ACE, Lysozyme   RF   ESR, CRP - today, inflammatory nodule stably improved  5. History of retinal detachment OD  - S/P SBP OD 11/26/14 -- Dr. Alan Mulder  - looks great -- retina in great position  - monitor  6. Pseudophakia OU  - s/p CE/IOL OU (Bevis)  - doing well  - monitor  7. Dry eyes OU - recommend lubricating ointment at night along with artificial tears  - recommend increasing artifical tears every hour due to corneal haze    Ophthalmic Meds Ordered this visit:  No orders of the defined types were placed in this encounter.    No follow-ups on file.  There are no Patient Instructions on file for this visit.  This document serves as a record of services personally performed by Karie Chimera, MD, PhD. It was created on their behalf by Charlette Caffey, COT an ophthalmic technician. The creation of this record is the provider's dictation and/or activities during the visit.    Electronically signed by:  Charlette Caffey, COT  09/27/23 9:54 AM   Karie Chimera, M.D., Ph.D. Diseases & Surgery of the Retina and Vitreous [facility]   Abbreviations: M myopia (nearsighted); A astigmatism; H hyperopia (farsighted); P presbyopia; Mrx spectacle prescription;  CTL contact lenses; OD right eye; OS left eye; OU  both eyes  XT exotropia; ET  esotropia; PEK punctate epithelial keratitis; PEE punctate epithelial erosions; DES dry eye syndrome; MGD meibomian gland dysfunction; ATs artificial tears; PFAT's preservative free artificial tears; NSC nuclear sclerotic cataract; PSC posterior subcapsular cataract; ERM epi-retinal membrane; PVD posterior vitreous detachment; RD retinal detachment; DM diabetes mellitus; DR diabetic retinopathy; NPDR non-proliferative diabetic retinopathy; PDR proliferative diabetic retinopathy; CSME clinically significant macular edema; DME diabetic macular edema; dbh dot blot hemorrhages; CWS cotton wool spot; POAG primary open angle glaucoma; C/D cup-to-disc ratio; HVF humphrey visual field; GVF goldmann visual field; OCT optical coherence tomography; IOP intraocular pressure; BRVO Branch retinal vein occlusion; CRVO central retinal vein occlusion; CRAO central retinal artery occlusion; BRAO branch retinal artery occlusion; RT retinal tear; SB scleral buckle; PPV pars plana vitrectomy; VH Vitreous hemorrhage; PRP panretinal laser photocoagulation; IVK intravitreal kenalog; VMT vitreomacular traction; MH Macular hole;  NVD neovascularization of the disc; NVE neovascularization elsewhere; AREDS age related eye disease study; ARMD age related macular degeneration; POAG primary open angle glaucoma; EBMD epithelial/anterior basement membrane dystrophy; ACIOL anterior chamber intraocular lens; IOL intraocular lens; PCIOL posterior chamber intraocular lens; Phaco/IOL phacoemulsification with intraocular lens placement; PRK photorefractive keratectomy; LASIK laser assisted in situ keratomileusis; HTN hypertension; DM diabetes mellitus; COPD chronic obstructive pulmonary disease

## 2023-09-27 ENCOUNTER — Encounter (INDEPENDENT_AMBULATORY_CARE_PROVIDER_SITE_OTHER): Payer: Self-pay | Admitting: Ophthalmology

## 2023-09-27 ENCOUNTER — Ambulatory Visit (INDEPENDENT_AMBULATORY_CARE_PROVIDER_SITE_OTHER): Payer: Medicare Other | Admitting: Ophthalmology

## 2023-09-27 DIAGNOSIS — H35352 Cystoid macular degeneration, left eye: Secondary | ICD-10-CM

## 2023-09-27 DIAGNOSIS — H15092 Other scleritis, left eye: Secondary | ICD-10-CM | POA: Diagnosis not present

## 2023-09-27 DIAGNOSIS — H3322 Serous retinal detachment, left eye: Secondary | ICD-10-CM

## 2023-09-27 DIAGNOSIS — H209 Unspecified iridocyclitis: Secondary | ICD-10-CM | POA: Diagnosis not present

## 2023-09-27 DIAGNOSIS — Z961 Presence of intraocular lens: Secondary | ICD-10-CM

## 2023-09-27 DIAGNOSIS — H04123 Dry eye syndrome of bilateral lacrimal glands: Secondary | ICD-10-CM

## 2023-09-27 DIAGNOSIS — Z8669 Personal history of other diseases of the nervous system and sense organs: Secondary | ICD-10-CM

## 2023-09-27 NOTE — Progress Notes (Signed)
[facility] - Clinic Note  10/11/2023     CHIEF COMPLAINT Patient presents for Retina Follow Up  HISTORY OF PRESENT ILLNESS: Carla May is a 82 y.o. female who presents to the clinic today for:   HPI     Retina Follow Up   Patient presents with  Other.  In left eye.  This started 2 weeks ago.  Duration of 2 weeks.  I, the attending physician,  performed the HPI with the patient and updated documentation appropriately.        Comments   Patient states that she is seeing flashes of light at night with the left eye. She is using Durezol OS 6 times a day.       Last edited by Rennis Chris, MD on 10/11/2023 10:40 PM.    Pt states her vision is still blurry in her left eye, she is not using Prolensa anymore, only Durezol 6x/day OS, she states she can see images, but cannot read words, she states she saw fol last week, but it was so quick so questioned whether or not it really happened, has an appt with Dr. Vincente Poli on December 5th   Referring physician: Ollen Bowl, MD [address] [place],  [place] [address]  HISTORICAL INFORMATION:   Selected notes from the MEDICAL RECORD NUMBER Referred by Dr. Ashok Cordia for concern of retinal hole.   CURRENT MEDICATIONS: Current Outpatient Medications (Ophthalmic Drugs)  Medication Sig   Carboxymethylcellulose Sod PF (REFRESH PLUS) 0.5 % SOLN as directed Ophthalmic   erythromycin ophthalmic ointment Place 1 Application into the left eye 4 (four) times daily.   Bromfenac Sodium 0.07 % SOLN Place 1 drop into the left eye 4 (four) times daily. (Patient not taking: Reported on 10/11/2023)   prednisoLONE acetate (PRED FORTE) 1 % ophthalmic suspension place 1 drop into L eye Ophthalmic Twice a day for 30 (Patient not taking: Reported on 10/11/2023)   prednisoLONE acetate (PRED FORTE) 1 % ophthalmic suspension Place 1 drop into the left eye every hour while awake. (Patient not taking: Reported on  10/11/2023)   No current facility-administered medications for this visit. (Ophthalmic Drugs)   Current Outpatient Medications (Other)  Medication Sig   acetaminophen (TYLENOL 8 HOUR ARTHRITIS PAIN) 650 MG CR tablet 1 tablet as needed Orally as needed   ascorbic acid (VITAMIN C) 500 MG tablet 1 tablet Orally Once a day   aspirin EC 81 MG tablet Take 81 mg by mouth daily. Swallow whole.   b complex vitamins capsule Take 1 capsule by mouth daily.   gabapentin (NEURONTIN) 300 MG capsule Take 1 capsule (300 mg total) by mouth at bedtime.   Icosapent Ethyl (VASCEPA PO) 2 capsules by mouth twice a day with food   icosapent Ethyl (VASCEPA) 1 g capsule Take 2 g by mouth 2 (two) times daily.   Multiple Vitamin (MULTIVITAMINS PO) 1 tablet Orally once a day   Omega-3 Fatty Acids (OMEGA III EPA+DHA) 1000 MG CAPS Take by mouth.   omeprazole (PRILOSEC OTC) 20 MG tablet 1 capsule Orally Once a day   CALCIUM-MAGNESUIUM-ZINC 333-133-8.3 MG TABS 1 tablet with a meal Orally Once a day   ezetimibe (ZETIA) 10 MG tablet Take by mouth.   meloxicam (MOBIC) 15 MG tablet Take 15 mg by mouth daily.   Current Facility-Administered Medications (Other)  Medication Route   triamcinolone acetonide (KENALOG-40) injection 4 mg Intravitreal   REVIEW OF SYSTEMS: ROS   Positive  for: Gastrointestinal, Neurological, Musculoskeletal, HENT, Eyes Negative for: Constitutional, Skin, Genitourinary, Endocrine, Cardiovascular, Respiratory, Psychiatric, Allergic/Imm, Heme/Lymph Last edited by Charlette Caffey, COT on 10/11/2023  9:02 AM.     ALLERGIES Allergies  Allergen Reactions   Atorvastatin Other (See Comments)    Muscle aches/ memory loss  Other Reaction(s): muscle aches/memory, Myalgias   Ezetimibe     Other Reaction(s): myalgias   Pravastatin     Other Reaction(s): myalgias   Simvastatin     Other Reaction(s): increased liver fxn   Penicillins Itching, Nausea Only and Nausea And Vomiting    Did it involve  swelling of the face/tongue/throat, SOB, or low BP? Unknown  Did it involve sudden or severe rash/hives, skin peeling, or any reaction on the inside of your mouth or nose? Itching  Did you need to seek medical attention at a hospital or doctor's office? No  When did it last happen? 1970s  If all above answers are "NO", may proceed with cephalosporin use.  Tolerated Cephalosporin Date: 05/04/22.  Other reaction(s): Not available  Other Reaction(s): GI Intolerance   Sulfa Antibiotics Nausea Only and Other (See Comments)   Sulfasalazine Other (See Comments)   PAST MEDICAL HISTORY Past Medical History:  Diagnosis Date   Anxiety    Arthritis    back. neck   Diabetes mellitus without complication (HCC)    Dysrhythmia    GERD (gastroesophageal reflux disease)    History of palpitations    HOH (hard of hearing) 06/28/2019   Hyperlipidemia    diet controlled and fish oil   MVP (mitral valve prolapse)    never has caused any problems per patient on 06/27/19   PONV (postoperative nausea and vomiting)    No problems in 08/2018; however has had previous problems   Pre-diabetes    diet controlled, no med   Retinal detachment    OU   SVD (spontaneous vaginal delivery)    x 2   Past Surgical History:  Procedure Laterality Date   ABDOMINAL HYSTERECTOMY  1970's   AIR/FLUID EXCHANGE Left 06/28/2019   Procedure: Air/Fluid Exchange;  Surgeon: Rennis Chris, MD;  Location: [facility] OR;  Service: Ophthalmology;  Laterality: Left;   CATARACT EXTRACTION Bilateral    Dr. Vonna Kotyk   COLONOSCOPY     polyp   EYE SURGERY Bilateral    Cat Sx and RD repair   GAS INSERTION Right 11/26/2014   Procedure: INSERTION OF GAS;  Surgeon: Sherrie George, MD;  Location: [facility] OR;  Service: Ophthalmology;  Laterality: Right;  C3F8   GAS/FLUID EXCHANGE Left 08/21/2018   Procedure: GAS/FLUID EXCHANGE;  Surgeon: Rennis Chris, MD;  Location: [facility] OR;  Service: Ophthalmology;  Laterality: Left;  C3F8   KNEE ARTHROSCOPY Left     LASER PHOTO ABLATION Left 06/28/2019   Procedure: Laser Photo Ablation;  Surgeon: Rennis Chris, MD;  Location: [facility] OR;  Service: Ophthalmology;  Laterality: Left;   PARS PLANA VITRECTOMY Left 06/28/2019   Procedure: PARS PLANA VITRECTOMY WITH 25 GAUGE;  Surgeon: Rennis Chris, MD;  Location: [facility] OR;  Service: Ophthalmology;  Laterality: Left;   PHOTOCOAGULATION WITH LASER Right 11/26/2014   Procedure: PHOTOCOAGULATION WITH LASER;  Surgeon: Sherrie George, MD;  Location: [facility] OR;  Service: Ophthalmology;  Laterality: Right;   PHOTOCOAGULATION WITH LASER Left 08/21/2018   Procedure: PHOTOCOAGULATION WITH LASER;  Surgeon: Rennis Chris, MD;  Location: [facility] OR;  Service: Ophthalmology;  Laterality: Left;   REPAIR OF COMPLEX TRACTION RETINAL DETACHMENT Left 12/06/2018   Procedure:  REPAIR OF COMPLEX TRACTION RETINAL DETACHMENT, 25 gauge vitrectomy, endolaser photocoagulation, memebrane peel, perfluoron injection,  and silicone oil;  Surgeon: Rennis Chris, MD;  Location: [facility] OR;  Service: Ophthalmology;  Laterality: Left;   RETINAL DETACHMENT SURGERY Bilateral    SBP OD - Dr. Alan Mulder (1.12.16).  SBP - Dr. Rennis Chris (10.7.19)   SCLERAL BUCKLE Right 11/26/2014   Procedure: SCLERAL BUCKLE RIGHT EYE ;  Surgeon: Sherrie George, MD;  Location: [facility] OR;  Service: Ophthalmology;  Laterality: Right;   SCLERAL BUCKLE WITH POSSIBLE 25 GAUGE PARS PLANA VITRECTOMY Left 08/21/2018   Procedure: SCLERAL BUCKLE WITH 25 GAUGE PARS PLANA VITRECTOMY;  Surgeon: Rennis Chris, MD;  Location: [facility] OR;  Service: Ophthalmology;  Laterality: Left;   SILICON OIL REMOVAL Left 06/28/2019   Procedure: Silicon Oil Removal;  Surgeon: Rennis Chris, MD;  Location: [facility] OR;  Service: Ophthalmology;  Laterality: Left;   TONSILLECTOMY     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   Procedure: TOTAL KNEE ARTHROPLASTY;  Surgeon: Ollen Gross, MD;  Location: [facility] ORS;  Service: Orthopedics;  Laterality: Left;   FAMILY HISTORY Family History   Problem Relation Age of Onset   Cancer Mother    Heart failure Father    SOCIAL HISTORY Social History   Tobacco Use   Smoking status: Former    Current packs/day: 0.00    Average packs/day: 0.5 packs/day for 10.0 years (5.0 ttl pk-yrs)    Types: Cigarettes    Start date: 26    Quit date: 41    Years since quitting: 54.9   Smokeless tobacco: Never   Tobacco comments:    quit in 1970's  Vaping Use   Vaping status: Never Used  Substance Use Topics   Alcohol use: No   Drug use: No       OPHTHALMIC EXAM:  Base Eye Exam     Visual Acuity (Snellen - Linear)       Right Left   Dist cc 20/30 +1 20/250 +1   Dist ph cc NI NI    Correction: Glasses         Tonometry (Tonopen, 9:07 AM)       Right Left   Pressure 12 14         Pupils       Dark Light Shape React APD   Right 3 2 Round Brisk None   Left 3 2 Round Brisk None         Visual Fields       Left Right    Full Full         Extraocular Movement       Right Left    Full, Ortho Full, Ortho         Neuro/Psych     Oriented x3: Yes   Mood/Affect: Normal         Dilation     Both eyes: 1.0% Mydriacyl, 2.5% Phenylephrine @ 9:04 AM           Slit Lamp and Fundus Exam     External Exam       Right Left   External Normal Normal         Slit Lamp Exam       Right Left   Lids/Lashes Dermatochalasis - upper lid Dermatochalasis - upper lid, Ptosis   Conjunctiva/Sclera White and quiet Trace Injection   Cornea Temporal Well healed cataract wounds, mild arcus, tear film debris, trace PEE tear film debris, irregular epithelium  centrally, mild corneal haze, +linear fluorescein staining / epi defect   Anterior Chamber deep, 05+ cell/pigment Deep, 1-2+ fine cell/pigment   Iris Round and dilated round and poorly dilated   Lens PC IOL in good position with open PC PC IOL in good position with open PC   Anterior Vitreous Vitreous syneresis, mild pigment in anterior vitreous,  Posterior vitreous detachment; scattered vitreous debris Post vitrectomy, fine +cell/pigment         Fundus Exam       Right Left   Disc Pink and Sharp, Compact Sharp rim, mild tilt, trace temporal pallor, temporal Peripapillary atrophy, Compact   C/D Ratio 0.2 0.3   Macula Flat, Good foveal reflex, Retinal pigment epithelial mottling, ERM, No heme or edema Flat, blunted foveal reflex, +epiretinal membrane, mild Retinal pigment epithelial mottling and clumping, cystic changes -- stably improved   Vessels attenuated, Tortuous attenuated, Tortuous   Periphery Attached over scleral buckle, good buckle height, good scarring over buckle Retina attached over buckle; good buckle height; good laser surrounding buckle w/ new row posterior to buckle; +fibrosis at 0300 over buckle; retinotomy from 0300-0430 with good laser surrounding           Refraction     Wearing Rx       Sphere Cylinder Axis   Right -2.75 +1.75 157   Left -4.25 +1.75 117           IMAGING AND PROCEDURES  Imaging and Procedures for @TODAY @  OCT, Retina - OU - Both Eyes       Right Eye Quality was good. Central Foveal Thickness: 289. Progression has been stable. Findings include normal foveal contour, no IRF, no SRF, epiretinal membrane (Mild ERM -- stable).   Left Eye Quality was poor. Central Foveal Thickness: 324. Progression has worsened. Findings include normal foveal contour, no IRF, no SRF, epiretinal membrane (Hazy view; Persistent ERM, mild vitreous opacities, no fluid).   Notes *Images captured and stored on drive  Diagnosis / Impression:  OD: mild ERM; NFP; no IRF/SRF-stable from prior OS: Hazy view; Persistent ERM, mild vitreous opacities, no fluid  Clinical management:  See below  Abbreviations: NFP - Normal foveal profile. CME - cystoid macular edema. PED - pigment epithelial detachment. IRF - intraretinal fluid. SRF - subretinal fluid. EZ - ellipsoid zone. ERM - epiretinal membrane. ORA -  outer retinal atrophy. ORT - outer retinal tubulation. SRHM - subretinal hyper-reflective material            ASSESSMENT/PLAN:    ICD-10-CM   1. 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 Keratopathy  H18.9     3. 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 History of retinal detachment  Z86.69     7. Pseudophakia of both eyes  Z96.1     8. Dry eyes  H04.123      1. Iritis / pigment dispersion OS - was improving on PF q1h and Prolensa QID OS, but slightly worse today after decreasing PF to q2h  - BCVA OS 20/250 - decreased from 20/200  - exam shows 1-2+ fine cell/pigment from 2-3+; no KP  - pt currently using Durezol 6x/day  - cont Meloxicam once a day as well -- pt has it for her arthritis  - seeing Dr. Vincente Poli  on Dec 5  - f/u here in 3-4 weeks  2. Keratopathy OS  - irregular epithelium with +linear fluorescein staining centrally (healing epi defect)  - unclear etiology - ?Durezol  - recommend erythromycin ung QID  - f/u in 3-4 wks  3. CME OS   - history of recurrent post-op CME - s/p sub-tenons kenalog (03.13.20) - interval increase / redevelopment of cystic changes OS on 12.09.21 and more recently on 12.20.22 -- restarted Prolensa QID OS (was still on PF QID OS) - pt follows with Dr. Vincente Poli, who she saw in April 2023, he tapered PF to BID OS and Prolensa to once a day.  - Prolensa increased back to BID per Dr. Vanessa Barbara (05.09.23),  - PF and Prolensa increased to 3-4/day (06.09.23), then back to BID on 07.11.23 - pt last saw Grewal on 04.18.24 -- he instructed her to use PF/Prolensa BID -- pt still using both TID - BCVA OS 20/250 - OCT shows Hazy view; Persistent ERM with blunting of foveal contour, mild vitreous opacities  - discussed possible need for low dose maintenance therapy to keep CME and inflammation reduced - continue Durezol for iritis as  above  - f/u here in 3 wks -- DFE/OCT  4. Retinal detachment, OS             - originally: macula-sparing inferior retinal detachment from 3-6 oclock             - large HST at 0500 and 2 small tears at 300 within the detached retina             - s/p SBP + PPV/PFC/EL/FAX/14% C3F8 OS, 10.07.19 - progressive fibrosis/PVR just posterior to buckle around 0400 with +SRF tracking posterior to buckle -- focal tractional/PVR detachment OS             - s/p PPV/MP/PFO/retinotomy/EL/1000cs silicon oil OS, 1.22.2020  - s/p 25g PPV with silicon oil removal (08.13.20) - retina attached and in good position -- good buckle height and laser over buckle and around retinotomy             - IOP okay at 14 today  - BCVA 20/250 as discussed above   5. Nodular scleritis OS -- recurrent, but currently quiescent - history of recurrent episodes following the tapering of meds (PO pred and topical PF/Prolensa)  - today (4.4.23) -- interval redevelopment of focal nodular scleritis in ST quad             - limited lab work to rule out rheumatologic cause--all results negative   CBC, CMP   HIV   HLA Panel   ANA   ANCA   ACE, Lysozyme   RF   ESR, CRP - today, inflammatory nodule stably improved  6. History of retinal detachment OD  - S/P SBP OD 11/26/14 -- Dr. Alan Mulder  - looks great -- retina in great position  - monitor  7. Pseudophakia OU  - s/p CE/IOL OU (Bevis)  - doing well  - monitor  8. Dry eyes OU - recommend lubricating ointment at night along with artificial tears     Ophthalmic Meds Ordered this visit:  Meds ordered this encounter  Medications   erythromycin ophthalmic ointment    Sig: Place 1 Application into the left eye 4 (four) times daily.    Dispense:  3.5 g    Refill:  5     Return for f/u 3-4 weeks, iritis / pigment dispersion OS, DFE, OCT.  There are no Patient Instructions on file for this visit.  This document serves as a record of services personally performed by  Karie Chimera, MD, PhD. It was created on their behalf by Charlette Caffey, COT an ophthalmic technician. The creation of this record is the provider's dictation and/or activities during the visit.    Electronically signed by:  Charlette Caffey, COT  10/11/23 10:50 PM  This document serves as a record of services personally performed by Karie Chimera, MD, PhD. It was created on their behalf by Glee Arvin. Manson Passey, OA an ophthalmic technician. The creation of this record is the provider's dictation and/or activities during the visit.    Electronically signed by: Glee Arvin. Manson Passey, OA 10/11/23 10:50 PM  Karie Chimera, M.D., Ph.D. Diseases & Surgery of the Retina and Vitreous [facility]  I have reviewed the above documentation for accuracy and completeness, and I agree with the above. Karie Chimera, M.D., Ph.D. 10/11/23 10:50 PM   Abbreviations: M myopia (nearsighted); A astigmatism; H hyperopia (farsighted); P presbyopia; Mrx spectacle prescription;  CTL contact lenses; OD right eye; OS left eye; OU both eyes  XT exotropia; ET esotropia; PEK punctate epithelial keratitis; PEE punctate epithelial erosions; DES dry eye syndrome; MGD meibomian gland dysfunction; ATs artificial tears; PFAT's preservative free artificial tears; NSC nuclear sclerotic cataract; PSC posterior subcapsular cataract; ERM epi-retinal membrane; PVD posterior vitreous detachment; RD retinal detachment; DM diabetes mellitus; DR diabetic retinopathy; NPDR non-proliferative diabetic retinopathy; PDR proliferative diabetic retinopathy; CSME clinically significant macular edema; DME diabetic macular edema; dbh dot blot hemorrhages; CWS cotton wool spot; POAG primary open angle glaucoma; C/D cup-to-disc ratio; HVF humphrey visual field; GVF goldmann visual field; OCT optical coherence tomography; IOP intraocular pressure; BRVO Branch retinal vein occlusion; CRVO central retinal vein occlusion; CRAO central  retinal artery occlusion; BRAO branch retinal artery occlusion; RT retinal tear; SB scleral buckle; PPV pars plana vitrectomy; VH Vitreous hemorrhage; PRP panretinal laser photocoagulation; IVK intravitreal kenalog; VMT vitreomacular traction; MH Macular hole;  NVD neovascularization of the disc; NVE neovascularization elsewhere; AREDS age related eye disease study; ARMD age related macular degeneration; POAG primary open angle glaucoma; EBMD epithelial/anterior basement membrane dystrophy; ACIOL anterior chamber intraocular lens; IOL intraocular lens; PCIOL posterior chamber intraocular lens; Phaco/IOL phacoemulsification with intraocular lens placement; PRK photorefractive keratectomy; LASIK laser assisted in situ keratomileusis; HTN hypertension; DM diabetes mellitus; COPD chronic obstructive pulmonary disease

## 2023-09-29 ENCOUNTER — Encounter (INDEPENDENT_AMBULATORY_CARE_PROVIDER_SITE_OTHER): Payer: Self-pay | Admitting: Ophthalmology

## 2023-10-11 ENCOUNTER — Ambulatory Visit (INDEPENDENT_AMBULATORY_CARE_PROVIDER_SITE_OTHER): Payer: Medicare Other | Admitting: Ophthalmology

## 2023-10-11 ENCOUNTER — Encounter (INDEPENDENT_AMBULATORY_CARE_PROVIDER_SITE_OTHER): Payer: Self-pay | Admitting: Ophthalmology

## 2023-10-11 DIAGNOSIS — H3322 Serous retinal detachment, left eye: Secondary | ICD-10-CM

## 2023-10-11 DIAGNOSIS — H35352 Cystoid macular degeneration, left eye: Secondary | ICD-10-CM

## 2023-10-11 DIAGNOSIS — H04123 Dry eye syndrome of bilateral lacrimal glands: Secondary | ICD-10-CM

## 2023-10-11 DIAGNOSIS — H209 Unspecified iridocyclitis: Secondary | ICD-10-CM

## 2023-10-11 DIAGNOSIS — Z8669 Personal history of other diseases of the nervous system and sense organs: Secondary | ICD-10-CM

## 2023-10-11 DIAGNOSIS — H189 Unspecified disorder of cornea: Secondary | ICD-10-CM

## 2023-10-11 DIAGNOSIS — Z961 Presence of intraocular lens: Secondary | ICD-10-CM

## 2023-10-11 DIAGNOSIS — H15092 Other scleritis, left eye: Secondary | ICD-10-CM

## 2023-10-11 MED ORDER — ERYTHROMYCIN 5 MG/GM OP OINT: 1.0000 | TOPICAL_OINTMENT | Freq: Four times a day (QID) | OPHTHALMIC | 5 refills | Status: DC

## 2023-10-18 NOTE — Progress Notes (Signed)
[facility] - Clinic Note  11/01/2023     CHIEF COMPLAINT Patient presents for Retina Follow Up  HISTORY OF PRESENT ILLNESS: Carla May is a 82 y.o. female who presents to the clinic today for:   HPI     Retina Follow Up   Patient presents with  Other.  In left eye.  This started 3 weeks ago.  I, the attending physician,  performed the HPI with the patient and updated documentation appropriately.        Comments   Patient here for 3 weeks retina follow up for iritis OS. Patient states can't tell much difference. Saw regular eye doctor.  On Durezel 6 times a day OS and will be reducing to 5 times a day on dec 19 th. Using ung QID OS      Last edited by Rennis Chris, MD on 11/01/2023 11:35 PM.       Referring physician: Ollen Bowl, MD [address] [place],  [place] [address]  HISTORICAL INFORMATION:   Selected notes from the MEDICAL RECORD NUMBER Referred by Dr. Ashok Cordia for concern of retinal hole.   CURRENT MEDICATIONS: Current Outpatient Medications (Ophthalmic Drugs)  Medication Sig   Carboxymethylcellulose Sod PF (REFRESH PLUS) 0.5 % SOLN as directed Ophthalmic   erythromycin ophthalmic ointment Place 1 Application into the left eye 4 (four) times daily.   Bromfenac Sodium 0.07 % SOLN Place 1 drop into the left eye 4 (four) times daily. (Patient not taking: Reported on 11/01/2023)   prednisoLONE acetate (PRED FORTE) 1 % ophthalmic suspension place 1 drop into L eye Ophthalmic Twice a day for 30 (Patient not taking: Reported on 11/01/2023)   prednisoLONE acetate (PRED FORTE) 1 % ophthalmic suspension Place 1 drop into the left eye every hour while awake. (Patient not taking: Reported on 11/01/2023)   No current facility-administered medications for this visit. (Ophthalmic Drugs)   Current Outpatient Medications (Other)  Medication Sig   acetaminophen (TYLENOL 8 HOUR ARTHRITIS PAIN) 650 MG CR tablet 1 tablet as  needed Orally as needed   ascorbic acid (VITAMIN C) 500 MG tablet 1 tablet Orally Once a day   aspirin EC 81 MG tablet Take 81 mg by mouth daily. Swallow whole.   b complex vitamins capsule Take 1 capsule by mouth daily.   gabapentin (NEURONTIN) 300 MG capsule Take 1 capsule (300 mg total) by mouth at bedtime.   Icosapent Ethyl (VASCEPA PO) 2 capsules by mouth twice a day with food   icosapent Ethyl (VASCEPA) 1 g capsule Take 2 g by mouth 2 (two) times daily.   Multiple Vitamin (MULTIVITAMINS PO) 1 tablet Orally once a day   Omega-3 Fatty Acids (OMEGA III EPA+DHA) 1000 MG CAPS Take by mouth.   omeprazole (PRILOSEC OTC) 20 MG tablet 1 capsule Orally Once a day   predniSONE (DELTASONE) 10 MG tablet Take 5 tablets (50 mg total) by mouth daily with breakfast.   CALCIUM-MAGNESUIUM-ZINC 333-133-8.3 MG TABS 1 tablet with a meal Orally Once a day   ezetimibe (ZETIA) 10 MG tablet Take by mouth.   meloxicam (MOBIC) 15 MG tablet Take 15 mg by mouth daily.   Current Facility-Administered Medications (Other)  Medication Route   triamcinolone acetonide (KENALOG-40) injection 4 mg Intravitreal   REVIEW OF SYSTEMS: ROS   Positive for: Gastrointestinal, Neurological, Musculoskeletal, HENT, Eyes Negative for: Constitutional, Skin, Genitourinary, Endocrine, Cardiovascular, Respiratory, Psychiatric, Allergic/Imm, Heme/Lymph Last edited by Laddie Aquas, COA on  11/01/2023  1:28 PM.      ALLERGIES Allergies  Allergen Reactions   Atorvastatin Other (See Comments)    Muscle aches/ memory loss  Other Reaction(s): muscle aches/memory, Myalgias   Ezetimibe     Other Reaction(s): myalgias   Pravastatin     Other Reaction(s): myalgias   Simvastatin     Other Reaction(s): increased liver fxn   Penicillins Itching, Nausea Only and Nausea And Vomiting    Did it involve swelling of the face/tongue/throat, SOB, or low BP? Unknown  Did it involve sudden or severe rash/hives, skin peeling, or any reaction  on the inside of your mouth or nose? Itching  Did you need to seek medical attention at a hospital or doctor's office? No  When did it last happen? 1970s  If all above answers are "NO", may proceed with cephalosporin use.  Tolerated Cephalosporin Date: 05/04/22.  Other reaction(s): Not available  Other Reaction(s): GI Intolerance   Sulfa Antibiotics Nausea Only and Other (See Comments)   Sulfasalazine Other (See Comments)   PAST MEDICAL HISTORY Past Medical History:  Diagnosis Date   Anxiety    Arthritis    back. neck   Diabetes mellitus without complication (HCC)    Dysrhythmia    GERD (gastroesophageal reflux disease)    History of palpitations    HOH (hard of hearing) 06/28/2019   Hyperlipidemia    diet controlled and fish oil   MVP (mitral valve prolapse)    never has caused any problems per patient on 06/27/19   PONV (postoperative nausea and vomiting)    No problems in 08/2018; however has had previous problems   Pre-diabetes    diet controlled, no med   Retinal detachment    OU   SVD (spontaneous vaginal delivery)    x 2   Past Surgical History:  Procedure Laterality Date   ABDOMINAL HYSTERECTOMY  1970's   AIR/FLUID EXCHANGE Left 06/28/2019   Procedure: Air/Fluid Exchange;  Surgeon: Rennis Chris, MD;  Location: [facility] OR;  Service: Ophthalmology;  Laterality: Left;   CATARACT EXTRACTION Bilateral    Dr. Vonna Kotyk   COLONOSCOPY     polyp   EYE SURGERY Bilateral    Cat Sx and RD repair   GAS INSERTION Right 11/26/2014   Procedure: INSERTION OF GAS;  Surgeon: Sherrie George, MD;  Location: [facility] OR;  Service: Ophthalmology;  Laterality: Right;  C3F8   GAS/FLUID EXCHANGE Left 08/21/2018   Procedure: GAS/FLUID EXCHANGE;  Surgeon: Rennis Chris, MD;  Location: [facility] OR;  Service: Ophthalmology;  Laterality: Left;  C3F8   KNEE ARTHROSCOPY Left    LASER PHOTO ABLATION Left 06/28/2019   Procedure: Laser Photo Ablation;  Surgeon: Rennis Chris, MD;  Location: [facility] OR;  Service:  Ophthalmology;  Laterality: Left;   PARS PLANA VITRECTOMY Left 06/28/2019   Procedure: PARS PLANA VITRECTOMY WITH 25 GAUGE;  Surgeon: Rennis Chris, MD;  Location: [facility] OR;  Service: Ophthalmology;  Laterality: Left;   PHOTOCOAGULATION WITH LASER Right 11/26/2014   Procedure: PHOTOCOAGULATION WITH LASER;  Surgeon: Sherrie George, MD;  Location: [facility] OR;  Service: Ophthalmology;  Laterality: Right;   PHOTOCOAGULATION WITH LASER Left 08/21/2018   Procedure: PHOTOCOAGULATION WITH LASER;  Surgeon: Rennis Chris, MD;  Location: [facility] OR;  Service: Ophthalmology;  Laterality: Left;   REPAIR OF COMPLEX TRACTION RETINAL DETACHMENT Left 12/06/2018   Procedure: REPAIR OF COMPLEX TRACTION RETINAL DETACHMENT, 25 gauge vitrectomy, endolaser photocoagulation, memebrane peel, perfluoron injection,  and silicone oil;  Surgeon: Rennis Chris, MD;  Location: [facility] OR;  Service: Ophthalmology;  Laterality: Left;   RETINAL DETACHMENT SURGERY Bilateral    SBP OD - Dr. Alan Mulder (1.12.16).  SBP - Dr. Rennis Chris (10.7.19)   SCLERAL BUCKLE Right 11/26/2014   Procedure: SCLERAL BUCKLE RIGHT EYE ;  Surgeon: Sherrie George, MD;  Location: [facility] OR;  Service: Ophthalmology;  Laterality: Right;   SCLERAL BUCKLE WITH POSSIBLE 25 GAUGE PARS PLANA VITRECTOMY Left 08/21/2018   Procedure: SCLERAL BUCKLE WITH 25 GAUGE PARS PLANA VITRECTOMY;  Surgeon: Rennis Chris, MD;  Location: [facility] OR;  Service: Ophthalmology;  Laterality: Left;   SILICON OIL REMOVAL Left 06/28/2019   Procedure: Silicon Oil Removal;  Surgeon: Rennis Chris, MD;  Location: [facility] OR;  Service: Ophthalmology;  Laterality: Left;   TONSILLECTOMY     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   Procedure: TOTAL KNEE ARTHROPLASTY;  Surgeon: Ollen Gross, MD;  Location: [facility] ORS;  Service: Orthopedics;  Laterality: Left;   FAMILY HISTORY Family History  Problem Relation Age of Onset   Cancer Mother    Heart failure Father    SOCIAL HISTORY Social History   Tobacco Use   Smoking  status: Former    Current packs/day: 0.00    Average packs/day: 0.5 packs/day for 10.0 years (5.0 ttl pk-yrs)    Types: Cigarettes    Start date: 77    Quit date: 1970    Years since quitting: 54.9   Smokeless tobacco: Never   Tobacco comments:    quit in 1970's  Vaping Use   Vaping status: Never Used  Substance Use Topics   Alcohol use: No   Drug use: No       OPHTHALMIC EXAM:  Base Eye Exam     Visual Acuity (Snellen - Linear)       Right Left   Dist cc 20/30 -2 20/800   Dist ph cc 20/30 +2 20/250 +2    Correction: Glasses         Tonometry (Tonopen, 1:24 PM)       Right Left   Pressure 10 unable  Unable to get a reading on 3 different tono pens in OS.        Pupils       Dark Light Shape React APD   Right 3 2 Round Brisk None   Left 3 2 Round Brisk None         Visual Fields (Counting fingers)       Left Right    Full Full         Extraocular Movement       Right Left    Full, Ortho Full, Ortho         Neuro/Psych     Oriented x3: Yes   Mood/Affect: Normal         Dilation     Both eyes: 1.0% Mydriacyl, 2.5% Phenylephrine @ 1:24 PM           Slit Lamp and Fundus Exam     External Exam       Right Left   External Normal Normal         Slit Lamp Exam       Right Left   Lids/Lashes Dermatochalasis - upper lid Dermatochalasis - upper lid, Ptosis   Conjunctiva/Sclera White and quiet Trace Injection   Cornea Temporal Well healed cataract wounds, mild arcus, tear film debris, trace PEE tear film debris, irregular epithelium centrally, mild corneal haze, +linear fluorescein staining / epi defect--closed,  whorl-like pattern centrally, 3+ PEE   Anterior Chamber deep, 0.5+ cell/pigment Deep, 3+ fine cell/pigment   Iris Round and dilated round and poorly dilated   Lens PC IOL in good position with open PC PC IOL in good position with open PC   Anterior Vitreous Vitreous syneresis, mild pigment in anterior vitreous,  Posterior vitreous detachment; scattered vitreous debris Post vitrectomy, fine +cell/pigment         Fundus Exam       Right Left   Disc Pink and Sharp, Compact Sharp rim, mild tilt, trace temporal pallor, temporal Peripapillary atrophy, Compact   C/D Ratio 0.2 0.3   Macula Flat, Good foveal reflex, Retinal pigment epithelial mottling, ERM, No heme or edema Flat, blunted foveal reflex, +epiretinal membrane, mild Retinal pigment epithelial mottling and clumping, cystic changes -- stably improved   Vessels attenuated, Tortuous attenuated, Tortuous   Periphery Attached over scleral buckle, good buckle height, good scarring over buckle Retina attached over buckle; good buckle height; good laser surrounding buckle w/ new row posterior to buckle; +fibrosis at 0300 over buckle; retinotomy from 0300-0430 with good laser surrounding           Refraction     Wearing Rx       Sphere Cylinder Axis   Right -2.75 +1.75 157   Left -4.25 +1.75 117           IMAGING AND PROCEDURES  Imaging and Procedures for @TODAY @  OCT, Retina - OU - Both Eyes       Right Eye Quality was good. Central Foveal Thickness: 289. Progression has been stable. Findings include normal foveal contour, no IRF, no SRF, epiretinal membrane (Mild ERM -- stable).   Left Eye Quality was poor. Central Foveal Thickness: 282. Progression has been stable. Findings include normal foveal contour, no IRF, no SRF, epiretinal membrane (Hazy view; Persistent ERM, mild vitreous opacities, no fluid).   Notes *Images captured and stored on drive  Diagnosis / Impression:  OD: mild ERM; NFP; no IRF/SRF-stable from prior OS: Hazy view; Persistent ERM, mild vitreous opacities, no fluid  Clinical management:  See below  Abbreviations: NFP - Normal foveal profile. CME - cystoid macular edema. PED - pigment epithelial detachment. IRF - intraretinal fluid. SRF - subretinal fluid. EZ - ellipsoid zone. ERM - epiretinal membrane. ORA  - outer retinal atrophy. ORT - outer retinal tubulation. 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 Keratopathy  H18.9     3. 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 History of retinal detachment  Z86.69     7. Pseudophakia of both eyes  Z96.1     8. Dry eyes  H04.123      1. Iritis / pigment dispersion OS - was improving on PF q1h and Prolensa QID OS, but slightly worse today after decreasing PF to q2h  - BCVA OS 20/250+2---stable  - exam shows 3+ fine cell/pigment from 1-2+; no KP  - pt currently using Durezol 6x/day  - cont Meloxicam once a day as well -- pt has it for her arthritis  - recommend starting po  prednisone 50 mg daily (10 mg tabs x 5 every morning)  - sees Dr. Vincente Poli again in Feb  - f/u 01.02.25  2. Keratopathy OS  - irregular epithelium with +linear fluorescein staining centrally (healing epi defect)  - unclear etiology - ?Durezol  - cont erythromycin ung QID  3. CME OS   - history of recurrent post-op CME - s/p sub-tenons kenalog (03.13.20) - interval increase / redevelopment of cystic changes OS on 12.09.21 and more recently on 12.20.22 -- restarted Prolensa QID OS (was still on PF QID OS) - pt follows with Dr. Vincente Poli, who she saw in April 2023, he tapered PF to BID OS and Prolensa to once a day.  - Prolensa increased back to BID per Dr. Vanessa Barbara (05.09.23),  - PF and Prolensa increased to 3-4/day (06.09.23), then back to BID on 07.11.23 - pt last saw Grewal on 04.18.24 -- he instructed her to use PF/Prolensa BID -- pt still using both TID - BCVA OS 20/250 - OCT shows Hazy view; Persistent ERM with blunting of foveal contour, mild vitreous opacities  - discussed possible need for low dose maintenance therapy  to keep CME and inflammation reduced - continue Durezol for iritis as above  - f/u here in 3 wks -- DFE/OCT  4. Retinal detachment, OS             - originally: macula-sparing inferior retinal detachment from 3-6 oclock             - large HST at 0500 and 2 small tears at 300 within the detached retina             - s/p SBP + PPV/PFC/EL/FAX/14% C3F8 OS, 10.07.19 - progressive fibrosis/PVR just posterior to buckle around 0400 with +SRF tracking posterior to buckle -- focal tractional/PVR detachment OS             - s/p PPV/MP/PFO/retinotomy/EL/1000cs silicon oil OS, 1.22.2020  - s/p 25g PPV with silicon oil removal (08.13.20) - retina attached and in good position -- good buckle height and laser over buckle and around retinotomy             - IOP okay at 14 today  - BCVA 20/250 as discussed above   5. Nodular scleritis OS -- recurrent, but currently quiescent - history of recurrent episodes following the tapering of meds (PO pred and topical PF/Prolensa)  - today (4.4.23) -- interval redevelopment of focal nodular scleritis in ST quad             - limited lab work to rule out rheumatologic cause--all results negative   CBC, CMP   HIV   HLA Panel   ANA   ANCA   ACE, Lysozyme   RF   ESR, CRP - today, inflammatory nodule stably improved  6. History of retinal detachment OD  - S/P SBP OD 11/26/14 -- Dr. Alan Mulder  - looks great -- retina in great position  - monitor  7. Pseudophakia OU  - s/p CE/IOL OU (Bevis)  - doing well  - monitor  8. Dry eyes OU - recommend lubricating ointment at night along with artificial tears     Ophthalmic Meds Ordered this visit:  Meds ordered this encounter  Medications   predniSONE (DELTASONE) 10 MG tablet    Sig: Take 5 tablets (50 mg total) by mouth daily with breakfast.    Dispense:  150 tablet    Refill:  0     Return in 16 days (  on 11/17/2023), or 11/17/23, for anterior inflammation / uveitis OS, Dilated Exam, OCT.  There are no  Patient Instructions on file for this visit.  This document serves as a record of services personally performed by Karie Chimera, MD, PhD. It was created on their behalf by Charlette Caffey, COT an ophthalmic technician. The creation of this record is the provider's dictation and/or activities during the visit.    Electronically signed by:  Charlette Caffey, COT  11/01/23 11:39 PM  Karie Chimera, M.D., Ph.D. Diseases & Surgery of the Retina and Vitreous [facility]  I have reviewed the above documentation for accuracy and completeness, and I agree with the above. Karie Chimera, M.D., Ph.D. 11/01/23 11:44 PM   Abbreviations: M myopia (nearsighted); A astigmatism; H hyperopia (farsighted); P presbyopia; Mrx spectacle prescription;  CTL contact lenses; OD right eye; OS left eye; OU both eyes  XT exotropia; ET esotropia; PEK punctate epithelial keratitis; PEE punctate epithelial erosions; DES dry eye syndrome; MGD meibomian gland dysfunction; ATs artificial tears; PFAT's preservative free artificial tears; NSC nuclear sclerotic cataract; PSC posterior subcapsular cataract; ERM epi-retinal membrane; PVD posterior vitreous detachment; RD retinal detachment; DM diabetes mellitus; DR diabetic retinopathy; NPDR non-proliferative diabetic retinopathy; PDR proliferative diabetic retinopathy; CSME clinically significant macular edema; DME diabetic macular edema; dbh dot blot hemorrhages; CWS cotton wool spot; POAG primary open angle glaucoma; C/D cup-to-disc ratio; HVF humphrey visual field; GVF goldmann visual field; OCT optical coherence tomography; IOP intraocular pressure; BRVO Branch retinal vein occlusion; CRVO central retinal vein occlusion; CRAO central retinal artery occlusion; BRAO branch retinal artery occlusion; RT retinal tear; SB scleral buckle; PPV pars plana vitrectomy; VH Vitreous hemorrhage; PRP panretinal laser photocoagulation; IVK intravitreal kenalog; VMT  vitreomacular traction; MH Macular hole;  NVD neovascularization of the disc; NVE neovascularization elsewhere; AREDS age related eye disease study; ARMD age related macular degeneration; POAG primary open angle glaucoma; EBMD epithelial/anterior basement membrane dystrophy; ACIOL anterior chamber intraocular lens; IOL intraocular lens; PCIOL posterior chamber intraocular lens; Phaco/IOL phacoemulsification with intraocular lens placement; PRK photorefractive keratectomy; LASIK laser assisted in situ keratomileusis; HTN hypertension; DM diabetes mellitus; COPD chronic obstructive pulmonary disease

## 2023-11-01 ENCOUNTER — Encounter (INDEPENDENT_AMBULATORY_CARE_PROVIDER_SITE_OTHER): Payer: Self-pay | Admitting: Ophthalmology

## 2023-11-01 ENCOUNTER — Ambulatory Visit (INDEPENDENT_AMBULATORY_CARE_PROVIDER_SITE_OTHER): Payer: Medicare Other | Admitting: Ophthalmology

## 2023-11-01 DIAGNOSIS — H04123 Dry eye syndrome of bilateral lacrimal glands: Secondary | ICD-10-CM

## 2023-11-01 DIAGNOSIS — H189 Unspecified disorder of cornea: Secondary | ICD-10-CM

## 2023-11-01 DIAGNOSIS — H35352 Cystoid macular degeneration, left eye: Secondary | ICD-10-CM

## 2023-11-01 DIAGNOSIS — H3322 Serous retinal detachment, left eye: Secondary | ICD-10-CM | POA: Diagnosis not present

## 2023-11-01 DIAGNOSIS — H15092 Other scleritis, left eye: Secondary | ICD-10-CM

## 2023-11-01 DIAGNOSIS — H209 Unspecified iridocyclitis: Secondary | ICD-10-CM | POA: Diagnosis not present

## 2023-11-01 DIAGNOSIS — Z961 Presence of intraocular lens: Secondary | ICD-10-CM

## 2023-11-01 DIAGNOSIS — Z8669 Personal history of other diseases of the nervous system and sense organs: Secondary | ICD-10-CM

## 2023-11-01 MED ORDER — PREDNISONE 10 MG PO TABS: 50.0000 mg | ORAL_TABLET | Freq: Every day | ORAL | 0 refills | Status: DC

## 2023-11-07 NOTE — Progress Notes (Signed)
 [facility] - Clinic Note  11/17/2023     CHIEF COMPLAINT Patient presents for Retina Follow Up  HISTORY OF PRESENT ILLNESS: Carla May is a 82 y.o. female who presents to the clinic today for:   HPI     Retina Follow Up   In left eye.  This started 3 weeks ago.  Duration of 3 weeks.  Since onset it is stable.  I, the attending physician,  performed the HPI with the patient and updated documentation appropriately.        Comments   3 week retina follow up iritis OS  pt is reporting vision has not improved she denies any flashes or floaters Durezol  for iritis prednisone  50 mg daily erythromycin  ung QID      Last edited by Semira Stoltzfus, MD on 11/17/2023  1:11 PM.     Pt states she is still taking 50mg  oral prednisone , she does not feel like her vision is getting better, she did not use erythromycin  ung this morning bc it makes her vision worse   Referring physician: Vernon Velna SAUNDERS, MD [address] [place],  [place] [address]  HISTORICAL INFORMATION:   Selected notes from the MEDICAL RECORD NUMBER Referred by Dr. Hadassah Louder for concern of retinal hole.   CURRENT MEDICATIONS: Current Outpatient Medications (Ophthalmic Drugs)  Medication Sig   Bromfenac  Sodium 0.07 % SOLN Place 1 drop into the left eye 4 (four) times daily. (Patient not taking: Reported on 11/01/2023)   Carboxymethylcellulose Sod PF (REFRESH PLUS) 0.5 % SOLN as directed Ophthalmic   erythromycin  ophthalmic ointment Place 1 Application into the left eye 4 (four) times daily.   prednisoLONE  acetate (PRED FORTE ) 1 % ophthalmic suspension place 1 drop into L eye Ophthalmic Twice a day for 30 (Patient not taking: Reported on 11/01/2023)   prednisoLONE  acetate (PRED FORTE ) 1 % ophthalmic suspension Place 1 drop into the left eye every hour while awake. (Patient not taking: Reported on 11/01/2023)   No current facility-administered medications for this visit. (Ophthalmic  Drugs)   Current Outpatient Medications (Other)  Medication Sig   acetaminophen  (TYLENOL  8 HOUR ARTHRITIS PAIN) 650 MG CR tablet 1 tablet as needed Orally as needed   ascorbic acid (VITAMIN C) 500 MG tablet 1 tablet Orally Once a day   aspirin  EC 81 MG tablet Take 81 mg by mouth daily. Swallow whole.   b complex vitamins capsule Take 1 capsule by mouth daily.   CALCIUM-MAGNESUIUM-ZINC 333-133-8.3 MG TABS 1 tablet with a meal Orally Once a day   ezetimibe  (ZETIA ) 10 MG tablet Take by mouth.   gabapentin  (NEURONTIN ) 300 MG capsule Take 1 capsule (300 mg total) by mouth at bedtime.   Icosapent Ethyl (VASCEPA PO) 2 capsules by mouth twice a day with food   icosapent Ethyl (VASCEPA) 1 g capsule Take 2 g by mouth 2 (two) times daily.   meloxicam (MOBIC) 15 MG tablet Take 15 mg by mouth daily.   Multiple Vitamin (MULTIVITAMINS PO) 1 tablet Orally once a day   Omega-3 Fatty Acids (OMEGA III EPA+DHA) 1000 MG CAPS Take by mouth.   omeprazole  (PRILOSEC  OTC) 20 MG tablet 1 capsule Orally Once a day   predniSONE  (DELTASONE ) 10 MG tablet Take 5 tablets (50 mg total) by mouth daily with breakfast.   Current Facility-Administered Medications (Other)  Medication Route   triamcinolone  acetonide (KENALOG -40) injection 4 mg Intravitreal   REVIEW OF SYSTEMS: ROS   Positive for:  Gastrointestinal, Neurological, Musculoskeletal, HENT, Eyes Negative for: Constitutional, Skin, Genitourinary, Endocrine, Cardiovascular, Respiratory, Psychiatric, Allergic/Imm, Heme/Lymph Last edited by Resa Delon ORN, COT on 11/17/2023 10:26 AM.     ALLERGIES Allergies  Allergen Reactions   Atorvastatin Other (See Comments)    Muscle aches/ memory loss  Other Reaction(s): muscle aches/memory, Myalgias   Ezetimibe      Other Reaction(s): myalgias   Pravastatin     Other Reaction(s): myalgias   Simvastatin     Other Reaction(s): increased liver fxn   Penicillins Itching, Nausea Only and Nausea And Vomiting    Did  it involve swelling of the face/tongue/throat, SOB, or low BP? Unknown  Did it involve sudden or severe rash/hives, skin peeling, or any reaction on the inside of your mouth or nose? Itching  Did you need to seek medical attention at a hospital or doctor's office? No  When did it last happen? 1970s  If all above answers are NO, may proceed with cephalosporin use.  Tolerated Cephalosporin Date: 05/04/22.  Other reaction(s): Not available  Other Reaction(s): GI Intolerance   Sulfa Antibiotics Nausea Only and Other (See Comments)   Sulfasalazine Other (See Comments)   PAST MEDICAL HISTORY Past Medical History:  Diagnosis Date   Anxiety    Arthritis    back. neck   Diabetes mellitus without complication (HCC)    Dysrhythmia    GERD (gastroesophageal reflux disease)    History of palpitations    HOH (hard of hearing) 06/28/2019   Hyperlipidemia    diet controlled and fish oil   MVP (mitral valve prolapse)    never has caused any problems per patient on 06/27/19   PONV (postoperative nausea and vomiting)    No problems in 08/2018; however has had previous problems   Pre-diabetes    diet controlled, no med   Retinal detachment    OU   SVD (spontaneous vaginal delivery)    x 2   Past Surgical History:  Procedure Laterality Date   ABDOMINAL HYSTERECTOMY  1970's   AIR/FLUID EXCHANGE Left 06/28/2019   Procedure: Air/Fluid Exchange;  Surgeon: Valdemar Rogue, MD;  Location: [facility] OR;  Service: Ophthalmology;  Laterality: Left;   CATARACT EXTRACTION Bilateral    Dr. Lavonia   COLONOSCOPY     polyp   EYE SURGERY Bilateral    Cat Sx and RD repair   GAS INSERTION Right 11/26/2014   Procedure: INSERTION OF GAS;  Surgeon: Norleen JONETTA Ku, MD;  Location: [facility] OR;  Service: Ophthalmology;  Laterality: Right;  C3F8   GAS/FLUID EXCHANGE Left 08/21/2018   Procedure: GAS/FLUID EXCHANGE;  Surgeon: Valdemar Rogue, MD;  Location: [facility] OR;  Service: Ophthalmology;  Laterality: Left;  C3F8   KNEE  ARTHROSCOPY Left    LASER PHOTO ABLATION Left 06/28/2019   Procedure: Laser Photo Ablation;  Surgeon: Valdemar Rogue, MD;  Location: [facility] OR;  Service: Ophthalmology;  Laterality: Left;   PARS PLANA VITRECTOMY Left 06/28/2019   Procedure: PARS PLANA VITRECTOMY WITH 25 GAUGE;  Surgeon: Valdemar Rogue, MD;  Location: [facility] OR;  Service: Ophthalmology;  Laterality: Left;   PHOTOCOAGULATION WITH LASER Right 11/26/2014   Procedure: PHOTOCOAGULATION WITH LASER;  Surgeon: Norleen JONETTA Ku, MD;  Location: [facility] OR;  Service: Ophthalmology;  Laterality: Right;   PHOTOCOAGULATION WITH LASER Left 08/21/2018   Procedure: PHOTOCOAGULATION WITH LASER;  Surgeon: Valdemar Rogue, MD;  Location: [facility] OR;  Service: Ophthalmology;  Laterality: Left;   REPAIR OF COMPLEX TRACTION RETINAL DETACHMENT Left 12/06/2018   Procedure: REPAIR OF  COMPLEX TRACTION RETINAL DETACHMENT, 25 gauge vitrectomy, endolaser photocoagulation, memebrane peel, perfluoron injection,  and silicone oil;  Surgeon: Valdemar Rogue, MD;  Location: [facility] OR;  Service: Ophthalmology;  Laterality: Left;   RETINAL DETACHMENT SURGERY Bilateral    SBP OD - Dr. Norleen Ku (1.12.16).  SBP - Dr. Rogue Valdemar (10.7.19)   SCLERAL BUCKLE Right 11/26/2014   Procedure: SCLERAL BUCKLE RIGHT EYE ;  Surgeon: Norleen JONETTA Ku, MD;  Location: [facility] OR;  Service: Ophthalmology;  Laterality: Right;   SCLERAL BUCKLE WITH POSSIBLE 25 GAUGE PARS PLANA VITRECTOMY Left 08/21/2018   Procedure: SCLERAL BUCKLE WITH 25 GAUGE PARS PLANA VITRECTOMY;  Surgeon: Valdemar Rogue, MD;  Location: [facility] OR;  Service: Ophthalmology;  Laterality: Left;   SILICON OIL REMOVAL Left 06/28/2019   Procedure: Silicon Oil Removal;  Surgeon: Valdemar Rogue, MD;  Location: [facility] OR;  Service: Ophthalmology;  Laterality: Left;   TONSILLECTOMY     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   Procedure: TOTAL KNEE ARTHROPLASTY;  Surgeon: Melodi Lerner, MD;  Location: [facility] ORS;  Service: Orthopedics;  Laterality: Left;   FAMILY  HISTORY Family History  Problem Relation Age of Onset   Cancer Mother    Heart failure Father    SOCIAL HISTORY Social History   Tobacco Use   Smoking status: Former    Current packs/day: 0.00    Average packs/day: 0.5 packs/day for 10.0 years (5.0 ttl pk-yrs)    Types: Cigarettes    Start date: 29    Quit date: 23    Years since quitting: 55.0   Smokeless tobacco: Never   Tobacco comments:    quit in 1970's  Vaping Use   Vaping status: Never Used  Substance Use Topics   Alcohol  use: No   Drug use: No       OPHTHALMIC EXAM:  Base Eye Exam     Visual Acuity (Snellen - Linear)       Right Left   Dist cc 20/40 +1 HM   Dist ph cc NI NI    Correction: Glasses         Tonometry (Tonopen, 10:32 AM)       Right Left   Pressure 10 6         Pupils       Pupils Dark Light Shape React APD   Right PERRL 3 2 Round Brisk None   Left PERRL 3 2 Round Brisk None         Visual Fields       Left Right    Full Full         Extraocular Movement       Right Left    Full, Ortho Full, Ortho         Neuro/Psych     Oriented x3: Yes   Mood/Affect: Normal         Dilation     Both eyes: 2.5% Phenylephrine  @ 10:32 AM           Slit Lamp and Fundus Exam     External Exam       Right Left   External Normal Normal         Slit Lamp Exam       Right Left   Lids/Lashes Dermatochalasis - upper lid Dermatochalasis - upper lid, Ptosis, Meibomian gland dysfunction   Conjunctiva/Sclera White and quiet white, +mucus   Cornea Temporal Well healed cataract wounds, mild arcus, tear film debris, trace PEE Central epi defect and  central haze, tear film debris, +linear fluorescein staining / epi defect--closed, whorl-like pattern centrally, 3+ PEE   Anterior Chamber deep, 0.5+ cell/pigment Deep, 4+ cell, layered hypopyon >84mm   Iris Round and dilated round and poorly dilated   Lens PC IOL in good position with open PC PC IOL in good position with  open PC   Anterior Vitreous Vitreous syneresis, mild pigment in anterior vitreous, Posterior vitreous detachment; scattered vitreous debris Post vitrectomy, fine +cell/pigment         Fundus Exam       Right Left   Disc Pink and Sharp, Compact Sharp rim, mild tilt, trace temporal pallor, temporal Peripapillary atrophy, Compact   C/D Ratio 0.2 0.3   Macula Flat, Good foveal reflex, Retinal pigment epithelial mottling, ERM, No heme or edema Flat, blunted foveal reflex, +epiretinal membrane, mild Retinal pigment epithelial mottling and clumping, cystic changes -- stably improved   Vessels attenuated, Tortuous attenuated, Tortuous   Periphery Attached over scleral buckle, good buckle height, good scarring over buckle Retina attached over buckle; good buckle height; good laser surrounding buckle w/ new row posterior to buckle; +fibrosis at 0300 over buckle; retinotomy from 0300-0430 with good laser surrounding           Refraction     Wearing Rx       Sphere Cylinder Axis   Right -2.75 +1.75 157   Left -4.25 +1.75 117           IMAGING AND PROCEDURES  Imaging and Procedures for @TODAY @  OCT, Retina - OU - Both Eyes       Right Eye Quality was good. Central Foveal Thickness: 278. Progression has been stable. Findings include normal foveal contour, no IRF, no SRF, epiretinal membrane (Mild ERM -- stable).   Left Eye Quality was poor. Progression has worsened. Findings include normal foveal contour, no IRF, no SRF, epiretinal membrane (Very hazy view; Persistent ERM, mild vitreous opacities, no fluid).   Notes *Images captured and stored on drive  Diagnosis / Impression:  OD: mild ERM; NFP; no IRF/SRF-stable from prior OS: Very hazy view; Persistent ERM, mild vitreous opacities, no fluid  Clinical management:  See below  Abbreviations: NFP - Normal foveal profile. CME - cystoid macular edema. PED - pigment epithelial detachment. IRF - intraretinal fluid. SRF -  subretinal fluid. EZ - ellipsoid zone. ERM - epiretinal membrane. ORA - outer retinal atrophy. ORT - outer retinal tubulation. SRHM - subretinal hyper-reflective material            ASSESSMENT/PLAN:    ICD-10-CM   1. 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 Keratopathy  H18.9     3. 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 History of retinal detachment  Z86.69     7. Pseudophakia of both eyes  Z96.1     8. Dry eyes  H04.123      1. Iritis / pigment dispersion OS - was getting worse, started on po prednisone , 50mg  daily on 12.17.24  - BCVA OS decreased to HM from 20/250+2  - exam shows 4+ fine cell/pigment from 1-2+; and now with sub-20mm layered hypopyon  - continue using Durezol  6x/day  - cont Meloxicam once a day as well -- pt has it for her arthritis  - dec  po prednisone  to 20 mg daily (2x 10mg  pills in the morning)  - sees Dr. Mat again in Feb  - recommend urgent f/u at [facility] for evaluation of worsening inflammation and hypopyon -- pt to be seen either today or tomorrow  - f/u here next week  2. Keratopathy OS  - irregular epithelium with +linear fluorescein staining centrally (healing epi defect)  - unclear etiology - ?Durezol   - cont erythromycin  ung QID  - add ofloxacin  gtts QID OS  3. CME OS   - history of recurrent post-op CME - s/p sub-tenons kenalog  (03.13.20) - interval increase / redevelopment of cystic changes OS on 12.09.21 and more recently on 12.20.22 -- restarted Prolensa  QID OS (was still on PF QID OS) - pt follows with Dr. Mat, who she saw in April 2023, he tapered PF to BID OS and Prolensa  to once a day.  - Prolensa  increased back to BID per Dr. Valdemar (05.09.23),  - PF and Prolensa  increased to 3-4/day (06.09.23), then back to BID on 07.11.23 - pt last saw Grewal on 04.18.24 -- he instructed her to use PF/Prolensa   BID -- pt still using both TID - BCVA OS HM - no CME on OCT - discussed possible need for low dose maintenance therapy to keep CME and inflammation reduced - continue Durezol  for iritis as above  4. Retinal detachment, OS             - originally: macula-sparing inferior retinal detachment from 3-6 oclock             - large HST at 0500 and 2 small tears at 300 within the detached retina             - s/p SBP + PPV/PFC/EL/FAX/14% C3F8 OS, 10.07.19 - progressive fibrosis/PVR just posterior to buckle around 0400 with +SRF tracking posterior to buckle -- focal tractional/PVR detachment OS             - s/p PPV/MP/PFO/retinotomy/EL/1000cs silicon oil OS, 1.22.2020  - s/p 25g PPV with silicon oil removal (08.13.20) - retina attached and in good position -- good buckle height and laser over buckle and around retinotomy             - IOP okay at 14 today  - BCVA 20/250 as discussed above   5. Nodular scleritis OS -- recurrent, but currently quiescent - history of recurrent episodes following the tapering of meds (PO pred and topical PF/Prolensa )  - today (4.4.23) -- interval redevelopment of focal nodular scleritis in ST quad             - limited lab work to rule out rheumatologic cause--all results negative   CBC, CMP   HIV   HLA Panel   ANA   ANCA   ACE, Lysozyme   RF   ESR, CRP - today, inflammatory nodule stably improved  6. History of retinal detachment OD  - S/P SBP OD 11/26/14 -- Dr. Norleen Ku  - looks great -- retina in great position  - monitor  7. Pseudophakia OU  - s/p CE/IOL OU (Bevis)  - doing well  - monitor  8. Dry eyes OU - recommend lubricating ointment at night along with artificial tears    Ophthalmic Meds Ordered this visit:  No orders of the defined types were placed in this encounter.    Return in about 1 week (around 11/24/2023) for f/u iritis OS, DFE, OCT.  There are no Patient Instructions on file  for this visit.  This document serves as a  record of services personally performed by Redell JUDITHANN Hans, MD, PhD. It was created on their behalf by Auston Muzzy, COMT. The creation of this record is the provider's dictation and/or activities during the visit.  Electronically signed by: Auston Muzzy, COMT 11/17/23 1:13 PM  This document serves as a record of services personally performed by Redell JUDITHANN Hans, MD, PhD. It was created on their behalf by Alan PARAS. Delores, OA an ophthalmic technician. The creation of this record is the provider's dictation and/or activities during the visit.    Electronically signed by: Alan PARAS. Delores, OA 11/17/23 1:13 PM   Redell JUDITHANN Hans, M.D., Ph.D. Diseases & Surgery of the Retina and Vitreous [facility]  I have reviewed the above documentation for accuracy and completeness, and I agree with the above. Redell JUDITHANN Hans, M.D., Ph.D. 11/17/23 1:19 PM  Abbreviations: M myopia (nearsighted); A astigmatism; H hyperopia (farsighted); P presbyopia; Mrx spectacle prescription;  CTL contact lenses; OD right eye; OS left eye; OU both eyes  XT exotropia; ET esotropia; PEK punctate epithelial keratitis; PEE punctate epithelial erosions; DES dry eye syndrome; MGD meibomian gland dysfunction; ATs artificial tears; PFAT's preservative free artificial tears; NSC nuclear sclerotic cataract; PSC posterior subcapsular cataract; ERM epi-retinal membrane; PVD posterior vitreous detachment; RD retinal detachment; DM diabetes mellitus; DR diabetic retinopathy; NPDR non-proliferative diabetic retinopathy; PDR proliferative diabetic retinopathy; CSME clinically significant macular edema; DME diabetic macular edema; dbh dot blot hemorrhages; CWS cotton wool spot; POAG primary open angle glaucoma; C/D cup-to-disc ratio; HVF humphrey visual field; GVF goldmann visual field; OCT optical coherence tomography; IOP intraocular pressure; BRVO Branch retinal vein occlusion; CRVO central retinal vein occlusion; CRAO central  retinal artery occlusion; BRAO branch retinal artery occlusion; RT retinal tear; SB scleral buckle; PPV pars plana vitrectomy; VH Vitreous hemorrhage; PRP panretinal laser photocoagulation; IVK intravitreal kenalog ; VMT vitreomacular traction; MH Macular hole;  NVD neovascularization of the disc; NVE neovascularization elsewhere; AREDS age related eye disease study; ARMD age related macular degeneration; POAG primary open angle glaucoma; EBMD epithelial/anterior basement membrane dystrophy; ACIOL anterior chamber intraocular lens; IOL intraocular lens; PCIOL posterior chamber intraocular lens; Phaco/IOL phacoemulsification with intraocular lens placement; PRK photorefractive keratectomy; LASIK laser assisted in situ keratomileusis; HTN hypertension; DM diabetes mellitus; COPD chronic obstructive pulmonary disease

## 2023-11-17 ENCOUNTER — Ambulatory Visit (INDEPENDENT_AMBULATORY_CARE_PROVIDER_SITE_OTHER): Payer: Medicare Other | Admitting: Ophthalmology

## 2023-11-17 ENCOUNTER — Encounter (INDEPENDENT_AMBULATORY_CARE_PROVIDER_SITE_OTHER): Payer: Self-pay | Admitting: Ophthalmology

## 2023-11-17 DIAGNOSIS — Z8669 Personal history of other diseases of the nervous system and sense organs: Secondary | ICD-10-CM

## 2023-11-17 DIAGNOSIS — H3322 Serous retinal detachment, left eye: Secondary | ICD-10-CM | POA: Diagnosis not present

## 2023-11-17 DIAGNOSIS — H35352 Cystoid macular degeneration, left eye: Secondary | ICD-10-CM

## 2023-11-17 DIAGNOSIS — Z961 Presence of intraocular lens: Secondary | ICD-10-CM

## 2023-11-17 DIAGNOSIS — H209 Unspecified iridocyclitis: Secondary | ICD-10-CM | POA: Diagnosis not present

## 2023-11-17 DIAGNOSIS — H15092 Other scleritis, left eye: Secondary | ICD-10-CM

## 2023-11-17 DIAGNOSIS — H189 Unspecified disorder of cornea: Secondary | ICD-10-CM

## 2023-11-17 DIAGNOSIS — H04123 Dry eye syndrome of bilateral lacrimal glands: Secondary | ICD-10-CM

## 2023-11-17 MED ORDER — OFLOXACIN 0.3 % OP SOLN: 1.0000 [drp] | Freq: Four times a day (QID) | OPHTHALMIC | 0 refills | Status: DC

## 2023-11-21 ENCOUNTER — Encounter (INDEPENDENT_AMBULATORY_CARE_PROVIDER_SITE_OTHER): Payer: Medicare Other | Admitting: Ophthalmology

## 2023-11-21 DIAGNOSIS — Z961 Presence of intraocular lens: Secondary | ICD-10-CM

## 2023-11-21 DIAGNOSIS — H209 Unspecified iridocyclitis: Secondary | ICD-10-CM

## 2023-11-21 DIAGNOSIS — Z8669 Personal history of other diseases of the nervous system and sense organs: Secondary | ICD-10-CM

## 2023-11-21 DIAGNOSIS — H189 Unspecified disorder of cornea: Secondary | ICD-10-CM

## 2023-11-21 DIAGNOSIS — H35352 Cystoid macular degeneration, left eye: Secondary | ICD-10-CM

## 2023-11-21 DIAGNOSIS — H15092 Other scleritis, left eye: Secondary | ICD-10-CM

## 2023-11-21 DIAGNOSIS — H3322 Serous retinal detachment, left eye: Secondary | ICD-10-CM

## 2023-11-21 DIAGNOSIS — H04123 Dry eye syndrome of bilateral lacrimal glands: Secondary | ICD-10-CM

## 2023-11-29 ENCOUNTER — Other Ambulatory Visit (INDEPENDENT_AMBULATORY_CARE_PROVIDER_SITE_OTHER): Payer: Self-pay | Admitting: Ophthalmology

## 2023-11-29 DIAGNOSIS — H209 Unspecified iridocyclitis: Secondary | ICD-10-CM

## 2024-01-05 ENCOUNTER — Other Ambulatory Visit (INDEPENDENT_AMBULATORY_CARE_PROVIDER_SITE_OTHER): Payer: Self-pay | Admitting: Ophthalmology

## 2024-07-06 ENCOUNTER — Other Ambulatory Visit (INDEPENDENT_AMBULATORY_CARE_PROVIDER_SITE_OTHER): Payer: Self-pay | Admitting: Ophthalmology

## 2024-10-04 NOTE — Progress Notes (Signed)
 Cardiology Clinic Note   Patient Name: Carla May Date of Encounter: 10/08/2024  Primary Care Provider:  Vernon Velna SAUNDERS, MD Primary Cardiologist:  None  Patient Profile    Carla May 83 year old female presents to the clinic today for evaluation of her chest discomfort.  Past Medical History    Past Medical History:  Diagnosis Date   Anxiety    Arthritis    back. neck   Diabetes mellitus without complication (HCC)    Dysrhythmia    GERD (gastroesophageal reflux disease)    History of palpitations    HOH (hard of hearing) 06/28/2019   Hyperlipidemia    diet controlled and fish oil   MVP (mitral valve prolapse)    never has caused any problems per patient on 06/27/19   PONV (postoperative nausea and vomiting)    No problems in 08/2018; however has had previous problems   Pre-diabetes    diet controlled, no med   Retinal detachment    OU   SVD (spontaneous vaginal delivery)    x 2   Past Surgical History:  Procedure Laterality Date   ABDOMINAL HYSTERECTOMY  1970's   AIR/FLUID EXCHANGE Left 06/28/2019   Procedure: Air/Fluid Exchange;  Surgeon: Valdemar Rogue, MD;  Location: [facility] OR;  Service: Ophthalmology;  Laterality: Left;   CATARACT EXTRACTION Bilateral    Dr. Lavonia   COLONOSCOPY     polyp   EYE SURGERY Bilateral    Cat Sx and RD repair   GAS INSERTION Right 11/26/2014   Procedure: INSERTION OF GAS;  Surgeon: Norleen JONETTA Ku, MD;  Location: [facility] OR;  Service: Ophthalmology;  Laterality: Right;  C3F8   GAS/FLUID EXCHANGE Left 08/21/2018   Procedure: GAS/FLUID EXCHANGE;  Surgeon: Valdemar Rogue, MD;  Location: [facility] OR;  Service: Ophthalmology;  Laterality: Left;  C3F8   KNEE ARTHROSCOPY Left    LASER PHOTO ABLATION Left 06/28/2019   Procedure: Laser Photo Ablation;  Surgeon: Valdemar Rogue, MD;  Location: [facility] OR;  Service: Ophthalmology;  Laterality: Left;   PARS PLANA VITRECTOMY Left 06/28/2019   Procedure: PARS PLANA VITRECTOMY WITH 25 GAUGE;  Surgeon:  Valdemar Rogue, MD;  Location: [facility] OR;  Service: Ophthalmology;  Laterality: Left;   PHOTOCOAGULATION WITH LASER Right 11/26/2014   Procedure: PHOTOCOAGULATION WITH LASER;  Surgeon: Norleen JONETTA Ku, MD;  Location: [facility] OR;  Service: Ophthalmology;  Laterality: Right;   PHOTOCOAGULATION WITH LASER Left 08/21/2018   Procedure: PHOTOCOAGULATION WITH LASER;  Surgeon: Valdemar Rogue, MD;  Location: [facility] OR;  Service: Ophthalmology;  Laterality: Left;   REPAIR OF COMPLEX TRACTION RETINAL DETACHMENT Left 12/06/2018   Procedure: REPAIR OF COMPLEX TRACTION RETINAL DETACHMENT, 25 gauge vitrectomy, endolaser photocoagulation, memebrane peel, perfluoron injection,  and silicone oil;  Surgeon: Valdemar Rogue, MD;  Location: [facility] OR;  Service: Ophthalmology;  Laterality: Left;   RETINAL DETACHMENT SURGERY Bilateral    SBP OD - Dr. Norleen Ku (1.12.16).  SBP - Dr. Rogue Valdemar (10.7.19)   SCLERAL BUCKLE Right 11/26/2014   Procedure: SCLERAL BUCKLE RIGHT EYE ;  Surgeon: Norleen JONETTA Ku, MD;  Location: [facility] OR;  Service: Ophthalmology;  Laterality: Right;   SCLERAL BUCKLE WITH POSSIBLE 25 GAUGE PARS PLANA VITRECTOMY Left 08/21/2018   Procedure: SCLERAL BUCKLE WITH 25 GAUGE PARS PLANA VITRECTOMY;  Surgeon: Valdemar Rogue, MD;  Location: [facility] OR;  Service: Ophthalmology;  Laterality: Left;   SILICON OIL REMOVAL Left 06/28/2019   Procedure: Silicon Oil Removal;  Surgeon: Valdemar Rogue, MD;  Location: [facility] OR;  Service: Ophthalmology;  Laterality: Left;   TONSILLECTOMY     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   Procedure: TOTAL KNEE ARTHROPLASTY;  Surgeon: Melodi Lerner, MD;  Location: [facility] ORS;  Service: Orthopedics;  Laterality: Left;    Allergies  Allergies  Allergen Reactions   Atorvastatin Other (See Comments)    Muscle aches/ memory loss  Other Reaction(s): muscle aches/memory, Myalgias   Ezetimibe      Other Reaction(s): myalgias   Pravastatin     Other Reaction(s): myalgias   Simvastatin     Other Reaction(s):  increased liver fxn   Penicillins Itching, Nausea Only and Nausea And Vomiting    Did it involve swelling of the face/tongue/throat, SOB, or low BP? Unknown  Did it involve sudden or severe rash/hives, skin peeling, or any reaction on the inside of your mouth or nose? Itching  Did you need to seek medical attention at a hospital or doctor's office? No  When did it last happen? 1970s  If all above answers are NO, may proceed with cephalosporin use.  Tolerated Cephalosporin Date: 05/04/22.  Other reaction(s): Not available  Other Reaction(s): GI Intolerance   Sulfa Antibiotics Nausea Only and Other (See Comments)   Sulfasalazine Other (See Comments)    History of Present Illness    [name] has a PMH of chronic low back pain, idiopathic peripheral neuropathy, osteoarthritis of left knee, numbness and tingling in her foot, HLD, diabetes and is hard of hearing.  She has a family history of cardiovascular disease with her father having coronary artery disease and passing away in his 65s.  She was seen and evaluated by her PCP Dr. Velna at [facility].  Her blood pressure was noted to be 120/74.  She was noted to have a systolic murmur.  She reported chest discomfort that had been present for several years.  She felt that this was related to stress and the diagnosis of left eye blindness.  Her EKG showed no acute abnormalities.  She requested referral to cardiology.  She presents to the clinic today for evaluation and states she has noticed chest pain with stress and at night or when she is laying down to rest.  She notes that her chest pain is in her upper left chest.  It is not reproducible.  She is active but does not exercise.  She denies exertional chest pain.  She denies bleeding issues.  She does note occasional episodes of dizziness when she moves fast.  She complains of numbness and a cold feeling in her feet.  We reviewed her family history of coronary artery disease.  I  have asked her to increase her p.o. hydration.  I will order CBC, BMP, TSH, magnesium , echocardiogram and coronary CTA for further evaluation.  Will plan follow-up in 2 months or after testing..  Today she denies  shortness of breath, lower extremity edema, fatigue,  melena, hematuria, hemoptysis, diaphoresis, weakness, presyncope, syncope, orthopnea, and PND.    Home Medications    Prior to Admission medications   Medication Sig Start Date End Date Taking? Authorizing Provider  acetaminophen  (TYLENOL  8 HOUR ARTHRITIS PAIN) 650 MG CR tablet 1 tablet as needed Orally as needed    [provider]  ascorbic acid (VITAMIN C) 500 MG tablet 1 tablet Orally Once a day    [provider]  aspirin  EC 81 MG tablet Take 81 mg by mouth daily. Swallow whole.    [provider]  b complex vitamins capsule Take 1  capsule by mouth daily.    [provider]  Bromfenac  Sodium 0.07 % SOLN Place 1 drop into the left eye 4 (four) times daily. Patient not taking: Reported on 11/01/2023 08/01/23   Valdemar Rogue, MD  CALCIUM-MAGNESUIUM-ZINC [id number] MG TABS 1 tablet with a meal Orally Once a day    [provider]  Carboxymethylcellulose Sod PF (REFRESH PLUS) 0.5 % SOLN as directed Ophthalmic    [provider]  erythromycin  ophthalmic ointment Place 1 Application into the left eye 4 (four) times daily. 10/11/23   Valdemar Rogue, MD  ezetimibe  (ZETIA ) 10 MG tablet Take by mouth. 12/14/17   [provider]  gabapentin  (NEURONTIN ) 300 MG capsule Take 1 capsule (300 mg total) by mouth at bedtime. 02/14/23   Emeline Joesph BROCKS, DO  Icosapent Ethyl (VASCEPA PO) 2 capsules by mouth twice a day with food 02/02/21   [provider]  icosapent Ethyl (VASCEPA) 1 g capsule Take 2 g by mouth 2 (two) times daily. 06/25/21   [provider]  meloxicam (MOBIC) 15 MG tablet Take 15 mg by mouth daily.    [provider]  Multiple Vitamin  (MULTIVITAMINS PO) 1 tablet Orally once a day    [provider]  ofloxacin  (OCUFLOX ) 0.3 % ophthalmic solution PLACE 1 DROP INTO THE LEFT EYE 4 TIMES DAILY FOR 10 DAYS. 01/10/24   Valdemar Rogue, MD  Omega-3 Fatty Acids (OMEGA III EPA+DHA) 1000 MG CAPS Take by mouth. 12/14/17   [provider]  omeprazole  (PRILOSEC  OTC) 20 MG tablet 1 capsule Orally Once a day    [provider]  prednisoLONE  acetate (PRED FORTE ) 1 % ophthalmic suspension place 1 drop into L eye Ophthalmic Twice a day for 30 Patient not taking: Reported on 11/01/2023    [provider]  prednisoLONE  acetate (PRED FORTE ) 1 % ophthalmic suspension Place 1 drop into the left eye every hour while awake. Patient not taking: Reported on 11/01/2023 08/01/23   Valdemar Rogue, MD  predniSONE  (DELTASONE ) 10 MG tablet Take 5 tablets (50 mg total) by mouth daily with breakfast. 11/01/23   Valdemar Rogue, MD    Family History    Family History  Problem Relation Age of Onset   Cancer Mother    Heart failure Father    She indicated that her mother is deceased. She indicated that her father is deceased.  Social History    Social History   Socioeconomic History   Marital status: Divorced    Spouse name: Not on file   Number of children: Not on file   Years of education: Not on file   Highest education level: Not on file  Occupational History   Not on file  Tobacco Use   Smoking status: Former    Current packs/day: 0.00    Average packs/day: 0.5 packs/day for 10.0 years (5.0 ttl pk-yrs)    Types: Cigarettes    Start date: 29    Quit date: 51    Years since quitting: 55.9   Smokeless tobacco: Never   Tobacco comments:    quit in 1970's  Vaping Use   Vaping status: Never Used  Substance and Sexual Activity   Alcohol  use: No   Drug use: No   Sexual activity: Not Currently    Birth control/protection: Surgical    Comment: Hysterectomy  Other Topics Concern   Not on file  Social  History Narrative   Not on file   Social Drivers of Health  Financial Resource Strain: Not on file  Food Insecurity: Not on file  Transportation Needs: Not on file  Physical Activity: Not on file  Stress: Not on file  Social Connections: Not on file  Intimate Partner Violence: Not on file     Review of Systems    General:  No chills, fever, night sweats or weight changes.  Cardiovascular:  No chest pain, dyspnea on exertion, edema, orthopnea, palpitations, paroxysmal nocturnal dyspnea. Dermatological: No rash, lesions/masses Respiratory: No cough, dyspnea Urologic: No hematuria, dysuria Abdominal:   No nausea, vomiting, diarrhea, bright red blood per rectum, melena, or hematemesis Neurologic:  No visual changes, wkns, changes in mental status. All other systems reviewed and are otherwise negative except as noted above.  Physical Exam    VS:  BP 128/64   Pulse 97   Ht 5' 5 (1.651 m)   Wt 155 lb 12.8 oz (70.7 kg)   LMP  (LMP Unknown)   SpO2 97%   BMI 25.93 kg/m  , BMI Body mass index is 25.93 kg/m. GEN: Well nourished, well developed, in no acute distress. HEENT: normal. Neck: Supple, no JVD, carotid bruits, or masses. Cardiac: RRR, no murmurs, rubs, or gallops. No clubbing, cyanosis, edema.  Radials/DP/PT 2+ and equal bilaterally.  Respiratory:  Respirations regular and unlabored, clear to auscultation bilaterally. GI: Soft, nontender, nondistended, BS + x 4. MS: no deformity or atrophy. Skin: warm and dry, no rash. Neuro:  Strength and sensation are intact. Psych: Normal affect.  Accessory Clinical Findings    Recent Labs: No results found for requested labs within last 365 days.   Recent Lipid Panel No results found for: CHOL, TRIG, HDL, CHOLHDL, VLDL, LDLCALC, LDLDIRECT       ECG personally reviewed by me today- EKG Interpretation Date/Time:  Monday October 08 2024 08:47:23 EST Ventricular Rate:  97 PR Interval:  186 QRS  Duration:  58 QT Interval:  338 QTC Calculation: 429 R Axis:   -10  Text Interpretation: Sinus rhythm with occasional Premature ventricular complexes and Premature atrial complexes When compared with ECG of 21-Apr-2022 10:23, Premature ventricular complexes are now Present Premature atrial complexes are now Present Confirmed by Emelia Hazy [phone]) on 10/08/2024 9:07:56 AM         Assessment & Plan   1.  Chest discomfort-EKG today shows sinus rhythm with PACs and PVCs 97 bpm.  Notes chest discomfort in the setting of increase stress.  She describes pain as a sharp type discomfort.  She denies exertional chest discomfort.  She reports her symptoms are worse with stress and dissipate with rest.  She also notices chest discomfort at rest.  Her chest pain occasionally happens at night.  Reports family history of cardiovascular disease with father having coronary artery disease and passing away in his 55s. Heart healthy low-sodium diet Continue Vascepa, aspirin  Order echocardiogram, coronary CTA, CBC, BMP, TSH, magnesium   Cardiac murmur-no increased DOE or activity intolerance.  Denies formal exercise routine.  Previously noted to have aortic atherosclerosis on echo.  I do not appreciate cardiac murmur today. Repeat echocardiogram for further prognostication.  Hyperlipidemia-LDL 110 on 02/16/2024.  Statin intolerant.  She did not tolerate Lipitor, Zocor, Pravachol, or ezetimibe  due to myalgias and memory impairment. High-fiber diet Increase physical activity as tolerated Continue Vascepa, aspirin   Type 2 diabetes-glucose 91 on 08/17/2024, A1c 6.4 Carb modified diet Increase physical activity as tolerated Follows with PCP  Disposition: Follow-up with Dr.Segal or me in 2-3 months.   Hazy HERO. Rhaya Coale NP-C  10/08/2024, 9:31 AM [facility] [address] [place], [place] [address] Office [phone]    Notice: This dictation was prepared  with Dragon dictation along with smaller phrase technology. Any transcriptional errors that result from this process are unintentional and may not be corrected upon review.   I spent 14 minutes examining this patient, reviewing medications, and using patient centered shared decision making involving their cardiac care.   I spent  20 minutes reviewing past medical history,  medications, and prior cardiac tests.

## 2024-10-08 ENCOUNTER — Encounter: Payer: Self-pay | Admitting: General Practice

## 2024-10-08 ENCOUNTER — Ambulatory Visit: Attending: General Practice | Admitting: General Practice

## 2024-10-08 VITALS — BP 128/64 | HR 97 | Ht 65.0 in | Wt 155.8 lb

## 2024-10-08 DIAGNOSIS — E782 Mixed hyperlipidemia: Secondary | ICD-10-CM

## 2024-10-08 DIAGNOSIS — R011 Cardiac murmur, unspecified: Secondary | ICD-10-CM

## 2024-10-08 DIAGNOSIS — R072 Precordial pain: Secondary | ICD-10-CM

## 2024-10-08 DIAGNOSIS — E119 Type 2 diabetes mellitus without complications: Secondary | ICD-10-CM

## 2024-10-08 DIAGNOSIS — R0789 Other chest pain: Secondary | ICD-10-CM

## 2024-10-08 LAB — BASIC METABOLIC PANEL WITH GFR
BUN/Creatinine Ratio: 25 (ref 12–28)
BUN: 18 mg/dL (ref 8–27)
CO2: 25 mmol/L (ref 20–29)
Calcium: 9.6 mg/dL (ref 8.7–10.3)
Chloride: 100 mmol/L (ref 96–106)
Creatinine, Ser: 0.73 mg/dL (ref 0.57–1.00)
Glucose: 107 mg/dL — ABNORMAL HIGH (ref 70–99)
Potassium: 5 mmol/L (ref 3.5–5.2)
Sodium: 138 mmol/L (ref 134–144)
eGFR: 82 mL/min/1.73 (ref >59)

## 2024-10-08 LAB — THYROID PANEL WITH TSH
Free Thyroxine Index: 2.1 (ref 1.2–4.9)
T3 Uptake Ratio: 24 % (ref 24–39)
T4, Total: 8.8 ug/dL (ref 4.5–12.0)
TSH: 1.77 u[IU]/mL (ref 0.450–4.500)

## 2024-10-08 LAB — MAGNESIUM: Magnesium: 2 mg/dL (ref 1.6–2.3)

## 2024-10-08 LAB — CBC
Hematocrit: 41.2 % (ref 34.0–46.6)
Hemoglobin: 13.5 g/dL (ref 11.1–15.9)
MCH: 29.1 pg (ref 26.6–33.0)
MCHC: 32.8 g/dL (ref 31.5–35.7)
MCV: 89 fL (ref 79–97)
Platelets: 276 x10E3/uL (ref 150–450)
RBC: 4.64 x10E6/uL (ref 3.77–5.28)
RDW: 12.4 % (ref 11.7–15.4)
WBC: 7.6 x10E3/uL (ref 3.4–10.8)

## 2024-10-08 MED ORDER — METOPROLOL TARTRATE 100 MG PO TABS: 100.0000 mg | ORAL_TABLET | Freq: Once | ORAL | 0 refills | Status: AC

## 2024-10-08 NOTE — Patient Instructions (Signed)
 Medication Instructions:  Your physician recommends that you continue on your current medications as directed. Please refer to the Current Medication list given to you today.  *If you need a refill on your cardiac medications before your next appointment, please call your pharmacy*  Lab Work: TODAY:  BMET, CBC, MAG, & THROID PANEL WITH TSH  If you have labs (blood work) drawn today and your tests are completely normal, you will receive your results only by: MyChart Message (if you have MyChart) OR A paper copy in the mail If you have any lab test that is abnormal or we need to change your treatment, we will call you to review the results.  Testing/Procedures: Your physician has requested that you have an echocardiogram. Echocardiography is a painless test that uses sound waves to create images of your heart. It provides your doctor with information about the size and shape of your heart and how well your heart's chambers and valves are working. This procedure takes approximately one hour. There are no restrictions for this procedure. Please do NOT wear cologne, perfume, aftershave, or lotions (deodorant is allowed). Please arrive 15 minutes prior to your appointment time.  Please note: We ask at that you not bring children with you during ultrasound (echo/ vascular) testing. Due to room size and safety concerns, children are not allowed in the ultrasound rooms during exams. Our front office staff cannot provide observation of children in our lobby area while testing is being conducted. An adult accompanying a patient to their appointment will only be allowed in the ultrasound room at the discretion of the ultrasound technician under special circumstances. We apologize for any inconvenience.   Your physician has requested that you have cardiac CT. Cardiac computed tomography (CT) is a painless test that uses an x-ray machine to take clear, detailed pictures of your heart. For further information  please visit [email or web address] Please follow instruction sheet BELOW:    Your cardiac CT will be scheduled at one of the below locations:   [facility] [address] [place], [place] [address] [phone] (Severe contrast allergies only)  OR   [facility] [address] [place], [place] [address] [phone]  OR   [facility] [address] [place], [place] [address] [phone]  OR   [facility] V[facility] Tower [address]  [place], [place] [address]  OR   [facility]  [address] [place], [place] [phone]  If scheduled at [facility], please arrive at the [facility] and Children's Entrance (Entrance C2) of [facility] 30 minutes prior to test start time. You can use the FREE valet parking offered at entrance C (encouraged to control the heart rate for the test)  Proceed to the [facility] Radiology Department (first floor) to check-in and test prep.  All radiology patients and guests should use entrance C2 at [facility], accessed from [facility], even though the hospital's physical address listed is [address]  If scheduled at the Heart and Vascular Tower at [address], please enter the parking lot using the [address] entrance and use the FREE valet service at the patient drop-off area. Enter the building and check-in with registration on the main floor.  If scheduled at [facility], please arrive to the Heart and Vascular Center 15 mins early for check-in and test prep.  There is spacious parking and easy access to the radiology department  from the [facility] Heart and Vascular entrance. Please enter here and check-in with the desk attendant.   If scheduled at [facility], please arrive 30 minutes early for check-in and test prep.  Please follow these instructions carefully (unless otherwise  directed):  An IV will be required for this test and Nitroglycerin will be given.    On the Night Before the Test: Be sure to Drink plenty of water . Do not consume any caffeinated/decaffeinated beverages or chocolate 12 hours prior to your test. Do not take any antihistamines 12 hours prior to your test.    On the Day of the Test: Drink plenty of water  until 1 hour prior to the test. Do not eat any food 1 hour prior to test. You may take your regular medications prior to the test.  Take metoprolol  (Lopressor ) 100 MG  two hours prior to test. THIS HAS BEEN SENT TO [facility]- please wear underwire-free bra if available, avoid dresses & tight clothing        After the Test: Drink plenty of water . After receiving IV contrast, you may experience a mild flushed feeling. This is normal. On occasion, you may experience a mild rash up to 24 hours after the test. This is not dangerous. If this occurs, you can take Benadryl  25 mg, Zyrtec, Claritin, or Allegra and increase your fluid intake. (Patients taking Tikosyn should avoid Benadryl , and may take Zyrtec, Claritin, or Allegra) If you experience trouble breathing, this can be serious. If it is severe call 911 IMMEDIATELY. If it is mild, please call our office.  We will call to schedule your test 2-4 weeks out understanding that some insurance companies will need an authorization prior to the service being performed.   For more information and frequently asked questions, please visit our website : [email or web address]  For non-scheduling related questions, please contact the cardiac imaging nurse navigator should you have any questions/concerns: Cardiac Imaging Nurse Navigators Direct Office Dial: [phone]   For scheduling needs, including cancellations and rescheduling, please call Brittany, [phone].   Follow-Up: At [facility], you and your health needs are our priority.  As part of our continuing  mission to provide you with exceptional heart care, our providers are all part of one team.  This team includes your primary Cardiologist (physician) and Advanced Practice Providers or APPs (Physician Assistants and Nurse Practitioners) who all work together to provide you with the care you need, when you need it.  Your next appointment:   2 month(s)  Provider:   Josefa Beauvais, NP          We recommend signing up for the patient portal called MyChart.  Sign up information is provided on this After Visit Summary.  MyChart is used to connect with patients for Virtual Visits (Telemedicine).  Patients are able to view lab/test results, encounter notes, upcoming appointments, etc.  Non-urgent messages can be sent to your provider as well.   To learn more about what you can do with MyChart, go to [email or web address].   Other Instructions

## 2024-10-09 ENCOUNTER — Ambulatory Visit: Payer: Self-pay | Admitting: General Practice

## 2024-10-09 NOTE — Progress Notes (Signed)
The patient has been notified of the result and verbalized understanding.  All questions (if any) were answered.     

## 2024-10-30 ENCOUNTER — Encounter (HOSPITAL_COMMUNITY): Payer: Self-pay

## 2024-11-01 ENCOUNTER — Other Ambulatory Visit: Payer: Self-pay | Admitting: Cardiovascular Disease

## 2024-11-01 ENCOUNTER — Ambulatory Visit (HOSPITAL_COMMUNITY)
Admission: RE | Admit: 2024-11-01 | Discharge: 2024-11-01 | Attending: Cardiovascular Disease | Admitting: Cardiovascular Disease

## 2024-11-01 ENCOUNTER — Ambulatory Visit (HOSPITAL_BASED_OUTPATIENT_CLINIC_OR_DEPARTMENT_OTHER): Admission: RE | Admit: 2024-11-01 | Source: Ambulatory Visit

## 2024-11-01 DIAGNOSIS — E782 Mixed hyperlipidemia: Secondary | ICD-10-CM | POA: Insufficient documentation

## 2024-11-01 DIAGNOSIS — R931 Abnormal findings on diagnostic imaging of heart and coronary circulation: Secondary | ICD-10-CM | POA: Diagnosis present

## 2024-11-01 DIAGNOSIS — I2510 Atherosclerotic heart disease of native coronary artery without angina pectoris: Secondary | ICD-10-CM | POA: Diagnosis not present

## 2024-11-01 DIAGNOSIS — R072 Precordial pain: Secondary | ICD-10-CM | POA: Diagnosis present

## 2024-11-01 DIAGNOSIS — E119 Type 2 diabetes mellitus without complications: Secondary | ICD-10-CM | POA: Diagnosis present

## 2024-11-01 DIAGNOSIS — R0789 Other chest pain: Secondary | ICD-10-CM | POA: Insufficient documentation

## 2024-11-01 DIAGNOSIS — R011 Cardiac murmur, unspecified: Secondary | ICD-10-CM | POA: Diagnosis present

## 2024-11-01 MED ORDER — IOHEXOL 350 MG/ML SOLN
100.0000 mL | Freq: Once | INTRAVENOUS | Status: AC | PRN
  Administered 2024-11-01: 12:00:00 100 mL via INTRAVENOUS

## 2024-11-01 MED ORDER — NITROGLYCERIN 0.4 MG SL SUBL
0.8000 mg | SUBLINGUAL_TABLET | Freq: Once | SUBLINGUAL | Status: AC
  Administered 2024-11-01: 12:00:00 0.8 mg via SUBLINGUAL

## 2024-11-02 ENCOUNTER — Ambulatory Visit: Payer: Self-pay | Admitting: General Practice

## 2024-11-02 DIAGNOSIS — E782 Mixed hyperlipidemia: Secondary | ICD-10-CM

## 2024-11-02 NOTE — Telephone Encounter (Signed)
 Patient has been notified directly; all questions, if any, were answered. Patient voiced understanding. Patent understands that referral has been placed for her for PharmD.

## 2024-11-21 ENCOUNTER — Ambulatory Visit (HOSPITAL_COMMUNITY)
Admission: RE | Admit: 2024-11-21 | Discharge: 2024-11-21 | Disposition: A | Discharge status: Home / Self Care | Source: Ambulatory Visit | Attending: Internal Medicine | Admitting: Internal Medicine

## 2024-11-21 DIAGNOSIS — R072 Precordial pain: Secondary | ICD-10-CM | POA: Diagnosis present

## 2024-11-21 DIAGNOSIS — R079 Chest pain, unspecified: Secondary | ICD-10-CM | POA: Diagnosis not present

## 2024-11-21 DIAGNOSIS — E119 Type 2 diabetes mellitus without complications: Secondary | ICD-10-CM | POA: Diagnosis present

## 2024-11-21 DIAGNOSIS — I10 Essential (primary) hypertension: Secondary | ICD-10-CM

## 2024-11-21 DIAGNOSIS — R0789 Other chest pain: Secondary | ICD-10-CM | POA: Insufficient documentation

## 2024-11-21 DIAGNOSIS — R011 Cardiac murmur, unspecified: Secondary | ICD-10-CM | POA: Insufficient documentation

## 2024-11-21 DIAGNOSIS — E782 Mixed hyperlipidemia: Secondary | ICD-10-CM | POA: Diagnosis present

## 2024-11-21 LAB — ECHOCARDIOGRAM COMPLETE
Area-P 1/2: 5.62 cm2
S' Lateral: 2.43 cm

## 2024-12-28 ENCOUNTER — Ambulatory Visit: Admitting: Pharmacist
